# Patient Record
Sex: Female | Born: 1955 | Race: White | Hispanic: No | Smoking: Never smoker
Health system: Southern US, Community
[De-identification: ages and names within clinical notes are randomized; demographics above are authoritative.]

## PROBLEM LIST (undated history)

## (undated) DIAGNOSIS — K589 Irritable bowel syndrome without diarrhea: Secondary | ICD-10-CM

## (undated) DIAGNOSIS — R000 Tachycardia, unspecified: Secondary | ICD-10-CM

## (undated) DIAGNOSIS — IMO0002 Reserved for concepts with insufficient information to code with codable children: Secondary | ICD-10-CM

## (undated) DIAGNOSIS — R51 Headache: Secondary | ICD-10-CM

## (undated) DIAGNOSIS — C801 Malignant (primary) neoplasm, unspecified: Secondary | ICD-10-CM

## (undated) DIAGNOSIS — R112 Nausea with vomiting, unspecified: Secondary | ICD-10-CM

## (undated) DIAGNOSIS — M1990 Unspecified osteoarthritis, unspecified site: Secondary | ICD-10-CM

## (undated) DIAGNOSIS — F419 Anxiety disorder, unspecified: Secondary | ICD-10-CM

## (undated) DIAGNOSIS — I10 Essential (primary) hypertension: Secondary | ICD-10-CM

## (undated) DIAGNOSIS — N189 Chronic kidney disease, unspecified: Secondary | ICD-10-CM

## (undated) DIAGNOSIS — Z8719 Personal history of other diseases of the digestive system: Secondary | ICD-10-CM

## (undated) DIAGNOSIS — K219 Gastro-esophageal reflux disease without esophagitis: Secondary | ICD-10-CM

## (undated) DIAGNOSIS — R9430 Abnormal result of cardiovascular function study, unspecified: Secondary | ICD-10-CM

## (undated) DIAGNOSIS — Z9889 Other specified postprocedural states: Secondary | ICD-10-CM

## (undated) HISTORY — DX: Reserved for concepts with insufficient information to code with codable children: IMO0002

## (undated) HISTORY — DX: Tachycardia, unspecified: R00.0

## (undated) HISTORY — PX: TUBAL LIGATION: SHX77

## (undated) HISTORY — PX: OTHER SURGICAL HISTORY: SHX169

## (undated) HISTORY — PX: JOINT REPLACEMENT: SHX530

## (undated) HISTORY — DX: Abnormal result of cardiovascular function study, unspecified: R94.30

## (undated) HISTORY — PX: EYE SURGERY: SHX253

## (undated) HISTORY — PX: CHOLECYSTECTOMY: SHX55

---

## 2005-08-24 ENCOUNTER — Ambulatory Visit: Payer: Self-pay | Admitting: Cardiology

## 2005-09-14 ENCOUNTER — Ambulatory Visit: Payer: Self-pay | Admitting: Cardiology

## 2005-11-04 ENCOUNTER — Inpatient Hospital Stay (HOSPITAL_COMMUNITY): Admission: AD | Admit: 2005-11-04 | Discharge: 2005-11-11 | Payer: Self-pay | Admitting: Psychiatry

## 2005-11-05 ENCOUNTER — Ambulatory Visit: Payer: Self-pay | Admitting: Psychiatry

## 2005-11-06 ENCOUNTER — Ambulatory Visit: Payer: Self-pay | Admitting: Psychiatry

## 2010-05-19 ENCOUNTER — Ambulatory Visit: Payer: Self-pay | Admitting: Internal Medicine

## 2010-06-03 ENCOUNTER — Ambulatory Visit: Payer: Self-pay | Admitting: Internal Medicine

## 2010-06-03 ENCOUNTER — Ambulatory Visit (HOSPITAL_COMMUNITY): Admission: RE | Admit: 2010-06-03 | Discharge: 2010-06-03 | Payer: Self-pay | Admitting: Internal Medicine

## 2010-12-24 NOTE — Discharge Summary (Signed)
NAMEMarland Velazquez  SHAUNDA, TIPPING NO.:  [id number]   MEDICAL RECORD NO.:  [id number]          PATIENT TYPE:  IPS   LOCATION:  0307                          FACILITY:  [facility]   PHYSICIAN:  Anselm Jungling, MD  DATE OF BIRTH:  1955/12/02   DATE OF ADMISSION:  11/04/2005  DATE OF DISCHARGE:  11/11/2005                                 DISCHARGE SUMMARY   IDENTIFYING DATA/REASON FOR ADMISSION:  The patient is a 55 year old married  white female admitted for treatment of alcohol dependence and mood disorder.  She had been found passed out after an alcohol binge.  She indicated that  she had been drinking more heavily over the previous two years.  She had  been receiving Celexa for depression, and Imitrex for migraine.  She had  been treated at another facility and started on a phenobarbital alcohol  withdrawal protocol and was transferred on that regimen.  Please refer to  the admission note for further details pertaining to the symptoms,  circumstances and history that led to hospitalization.   INITIAL DIAGNOSTIC IMPRESSION:  She was given an initial AXIS I diagnosis of  alcohol dependence, impending alcohol withdrawal, and depressive disorder  not otherwise specified.   MEDICAL/LABORATORY:  The patient was medically and physically assessed by  the psychiatric nurse practitioner upon admission.  There were no  significant medical issues during this inpatient stay, outside of her  alcohol detoxification.   HOSPITAL COURSE:  The patient was admitted to the adult inpatient  psychiatric service.  She was discontinued from her phenobarbital protocol  in favor of a Librium withdrawal protocol.  She was continued on Celexa.  Low dose Risperdal was begun to further address symptoms of anxiety and  agitation.  She presented as a well-nourished, well-developed, healthy-  appearing and pleasant woman who was well-oriented, sad, and nontremulous.  There were no signs or symptoms of  psychosis or thought disorder, and she  denied hallucinations.  She was absent suicidal ideation, but wanted  continuing help with alcohol dependence, alcohol detoxification and ongoing  treatment of depression.   The patient's detoxification process continued fairly normally.  She  expressed a positive attitude towards sobriety and 12-step recovery and  indicated a strong desire to involve herself in Alcoholics Anonymous  immediately following her discharge.  She was able to cite close family who  were also interested in supporting her involvement in AA, and possibly Al-  Anon.   By the eighth hospital day, the patient appeared to be sufficiently stable  that she could be discharged.   On the final hospital day, there was a family meeting involving the patient,  her husband, and the psychiatric family therapist.  She denied suicidal  ideation.  They discussed the patient's plan to return home with her  husband.  Husband stated that he had thrown all of the pills out of the  home, and that he was going to be as supportive as he could of the patient.  He urged her to quit her second job.  This has been very stressful for her.  They discussed  different family members that will be further supporting the  patient after discharge and checking on her.  They discussed her impending  involvement with Alcoholics Anonymous.  Following this, the patient was  discharged.   AFTERCARE:  The patient was to follow up with Dr. Omelia Blackwater at [facility], and Zenia Resides, to be seen on November 18, 2005.   DISCHARGE MEDICATIONS:  1.  Celexa 40 mg daily.  2.  Risperdal 0.25 mg q.a.m. and 0.5 mg q.h.s.  3.  Cogentin 1 mg b.i.d.   DISCHARGE DIAGNOSES:  AXIS I:  Alcohol dependence.  Mood disorder not  otherwise specified.  AXIS II:  Deferred.  AXIS III:  No acute or chronic illnesses.  AXIS IV:  Stressors:  Severe.  AXIS V:  GAF on discharge 65.           ______________________________   Anselm Jungling, MD  Electronically Signed     SPB/MEDQ  D:  11/14/2005  T:  11/15/2005  Job:  [phone] [id number]

## 2010-12-24 NOTE — H&P (Signed)
NAMEMarland Velazquez  Velazquez, Kathleen NO.:  [id number]   MEDICAL RECORD NO.:  [id number]          PATIENT TYPE:  IPS   LOCATION:  0307                          FACILITY:  [facility]   PHYSICIAN:  Anselm Jungling, MD  DATE OF BIRTH:  [birth year]   DATE OF ADMISSION:  11/04/2005  DATE OF DISCHARGE:                         PSYCHIATRIC ADMISSION ASSESSMENT   IDENTIFYING INFORMATION:  This is an involuntary admission to the services  of Dr. Electa Sniff.  This is a 55 year old married white female.  She presented  to [facility] yesterday with a blood alcohol level of 312.  She is a  patient there of Dr. Robynn Pane.  Apparently she was acting quite unusual.  According to the patient's husband she was very anxious.  She has been  depressed and recently has been drinking a lot of alcohol.  There was a  question as to whether she had taken some of her medications from home and  she is employed as a Lawyer at [employer].   PAST PSYCHIATRIC HISTORY:  She states that she saw a therapist approximately  2 years ago.  She states her depression began in 2005.  At that time there  were issues going on in the marriage.  Also she was status post a total  right knee and a woman that she sat with died.  She states this gave her for  some unknown reason quite a bit of depression and she has not been able to  resolve that.  She states that she has been prescribed Celexa and when she  takes it is beneficial, however if she needs to refill the prescription or  for a variety of reasons she will become noncompliant.  When alone, she  starts to drink.  She drinks vodka.   SOCIAL HISTORY:  She finished the 12th grade.  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  This is her second marriage.  She has a daughter from her first  marriage, age 40.  She has a stepson from this marriage, age 40.   FAMILY HISTORY:  Her father is an alcoholic.  In fact, her mother divorced  him 12 years ago because of this, and she  provides care to him.  Apparently  he has COPD.   ALCOHOL AND DRUG ABUSE:  She states that alcohol became problematic in the  past year or so.  She is drinking vodka when alone, especially at night.   PAST MEDICAL HISTORY:  She is status post a cholecystectomy, right knee  replacement in 2005, tubal ligation.  She has just begun therapy for hot  flashes with Prempro.  Medications:  She is currently prescribed Celexa 40  mg p.o. daily, Imitrex Nasal 20 mg p.r.n. each nostril for migraine  prophylaxis, Ambien CR 6.25 mg at h.s. and Prempro 1 p.o. daily.   POSITIVE PHYSICAL FINDINGS:  PHYSICAL EXAMINATION:  Physical examination  reveals a well-nourished, well-developed labile female.  She easily breaks  into tears.  Other than that her physical examination is unremarkable.  Her  admitting vital signs show that her temperature is 98, blood pressure  is  15/91, pulse 96, respirations 16, O2 saturation is 98.  She does have  varicose veins.  She has a scar from her right total knee and she has  several tattoos.  The remainder of her other lab work:  Her CBC showed a  white count of 8200, hemoglobin 14.4, platelet count was 238,000.  Urine was  normal with a trace of blood.  Urine drug screen was negative.  Acetaminophen level was less than 10, salicylate level was less than 4.   ALLERGIES:  Drug allergies are to CODEINE.   MENTAL STATUS EXAM:  She is alert and oriented.  She is appropriately  groomed, dressed and nourished.  She has good eye contact.  Her speech is  not pressured.  Her mood however is labile.  She cries freely and  frequently.  Her affect shows a range.  Her thought processes are clear,  well organized, and goal oriented.  She states that she has to take better  care of herself.  Her judgment and insight are fair.  Her intelligence is at  least average.  She specifically denies suicidal or homicidal ideation.  She  denies auditory or visual hallucinations.  She states that a  big issue that  she has is with sleep.  She says she just cannot turn her thoughts off at  night and so we may want to consider some Lamictal or some other mood  stabilizer.   ADMISSION DIAGNOSES:  AXIS I:  Depression and anxiety, alcohol dependency.  AXIS II:  None.  AXIS III:  Migraine headaches, status post right knee replacement, status  post cholecystectomy, tubal ligation, currently just beginning treatment for  menopausal symptoms.  AXIS IV:  Severe.  AXIS V:  Global assessment of function is 30.   PLAN:  The plan is to admit for safety and stabilization.  We will detox  according to the phenobarbital protocol.  We will re-establish compliance  with Celexa since it is of benefit to her, and consider adding a mood  stabilizer to help with her racing thoughts at night.      Mickie Leonarda Salon, P.A.-C.      Anselm Jungling, MD  Electronically Signed    MD/MEDQ  D:  11/05/2005  T:  11/06/2005  Job:  [id number]

## 2011-01-05 ENCOUNTER — Ambulatory Visit (INDEPENDENT_AMBULATORY_CARE_PROVIDER_SITE_OTHER): Payer: PRIVATE HEALTH INSURANCE | Admitting: Internal Medicine

## 2011-01-05 DIAGNOSIS — K219 Gastro-esophageal reflux disease without esophagitis: Secondary | ICD-10-CM

## 2011-01-05 DIAGNOSIS — K589 Irritable bowel syndrome without diarrhea: Secondary | ICD-10-CM

## 2011-08-24 ENCOUNTER — Other Ambulatory Visit: Payer: Self-pay | Admitting: Orthopedic Surgery

## 2011-11-14 ENCOUNTER — Encounter (HOSPITAL_COMMUNITY): Payer: Self-pay

## 2011-11-21 ENCOUNTER — Ambulatory Visit (HOSPITAL_COMMUNITY)
Admission: RE | Admit: 2011-11-21 | Discharge: 2011-11-21 | Disposition: A | Discharge status: Home / Self Care | Payer: No Typology Code available for payment source | Source: Ambulatory Visit | Attending: Orthopedic Surgery | Admitting: Orthopedic Surgery

## 2011-11-21 ENCOUNTER — Encounter (HOSPITAL_COMMUNITY): Payer: Self-pay

## 2011-11-21 ENCOUNTER — Encounter (HOSPITAL_COMMUNITY)
Admission: RE | Admit: 2011-11-21 | Discharge: 2011-11-21 | Disposition: A | Discharge status: Home / Self Care | Payer: No Typology Code available for payment source | Source: Ambulatory Visit | Attending: Orthopedic Surgery | Admitting: Orthopedic Surgery

## 2011-11-21 DIAGNOSIS — Z01810 Encounter for preprocedural cardiovascular examination: Secondary | ICD-10-CM | POA: Insufficient documentation

## 2011-11-21 DIAGNOSIS — Z01818 Encounter for other preprocedural examination: Secondary | ICD-10-CM | POA: Insufficient documentation

## 2011-11-21 DIAGNOSIS — Z01812 Encounter for preprocedural laboratory examination: Secondary | ICD-10-CM | POA: Insufficient documentation

## 2011-11-21 DIAGNOSIS — M1710 Unilateral primary osteoarthritis, unspecified knee: Secondary | ICD-10-CM | POA: Insufficient documentation

## 2011-11-21 HISTORY — DX: Anxiety disorder, unspecified: F41.9

## 2011-11-21 HISTORY — DX: Unspecified osteoarthritis, unspecified site: M19.90

## 2011-11-21 HISTORY — DX: Headache: R51

## 2011-11-21 HISTORY — DX: Gastro-esophageal reflux disease without esophagitis: K21.9

## 2011-11-21 LAB — CBC
HCT: 39.5 % (ref 36.0–46.0)
Hemoglobin: 13.4 g/dL (ref 12.0–15.0)
MCH: 31.2 pg (ref 26.0–34.0)
MCHC: 33.9 g/dL (ref 30.0–36.0)
RDW: 12 % (ref 11.5–15.5)

## 2011-11-21 LAB — URINALYSIS, ROUTINE W REFLEX MICROSCOPIC
Hgb urine dipstick: NEGATIVE
Leukocytes, UA: NEGATIVE
Nitrite: NEGATIVE
Protein, ur: NEGATIVE mg/dL
Specific Gravity, Urine: 1.018 (ref 1.005–1.030)
Urobilinogen, UA: 0.2 mg/dL (ref 0.0–1.0)

## 2011-11-21 LAB — COMPREHENSIVE METABOLIC PANEL
Albumin: 4.1 g/dL (ref 3.5–5.2)
BUN: 14 mg/dL (ref 6–23)
Calcium: 9.7 mg/dL (ref 8.4–10.5)
Creatinine, Ser: 0.8 mg/dL (ref 0.50–1.10)
GFR calc Af Amer: >90 mL/min (ref >90)
Glucose, Bld: 90 mg/dL (ref 70–99)
Potassium: 3.5 mEq/L (ref 3.5–5.1)
Total Protein: 7.2 g/dL (ref 6.0–8.3)

## 2011-11-21 LAB — PROTIME-INR
INR: 0.96 (ref 0.00–1.49)
Prothrombin Time: 13 seconds (ref 11.6–15.2)

## 2011-11-21 LAB — APTT: aPTT: 26 seconds (ref 24–37)

## 2011-11-21 LAB — SURGICAL PCR SCREEN: Staphylococcus aureus: POSITIVE — AB

## 2011-11-21 NOTE — Patient Instructions (Signed)
20 Kathleen Velazquez  11/21/2011   Your procedure is scheduled on:  11/28/11 [id number]  Report to [facility] at 0600 AM.  Call this number if you have problems the morning of surgery: [phone]   Remember:   Do not eat food:After Midnight.  May have clear liquids:until Midnight .  Clear liquids include soda, tea, black coffee, apple or grape juice, broth.  Take these medicines the morning of surgery with A SIP OF WATER:    Do not wear jewelry, make-up or nail polish.  Do not wear lotions, powders, or perfumes.   Do not shave 48 hours prior to surgery.  Do not bring valuables to the hospital.  Contacts, dentures or bridgework may not be worn into surgery.  Leave suitcase in the car. After surgery it may be brought to your room.  For patients admitted to the hospital, checkout time is 11:00 AM the day of discharge.     Special Instructions: CHG Shower Use Special Wash: 1/2 bottle night before surgery and 1/2 bottle morning of surgery. shower chin to toes with CHG.  Wash face and private parts with regular soap.     Please read over the following fact sheets that you were given: MRSA Information, Incentive Spirometry Fact Sheet, coughing and deep breathing exercises, leg exercises

## 2011-11-21 NOTE — Pre-Procedure Instructions (Signed)
11/21/11 Drew, PA made aware no preop antibiotic ordered .  Faxed and confirmation received to [place], [place] at [phone].

## 2011-11-22 NOTE — Pre-Procedure Instructions (Signed)
11/22/11 Pt called back and had received message regarding positive pcr screen for staph.  PT voiced understanding of instructions.

## 2011-11-22 NOTE — H&P (Signed)
Kathleen Velazquez DOB: 1955-11-22   Chief Complaint: left knee pain  History of Present Illness The patient is a 56 year old female who comes in today for a preoperative History and Physical. The patient is scheduled for a left total knee arthroplasty to be performed by Dr. Gus Rankin. Aluisio, MD at [facility] on Monday November 28, 2011 . The patient reports left knee symptoms including pain, swelling, instability, locking, catching, stiffness, soreness and grinding without any known injury for several months. The patient feels that the symptoms are worsening. The patient has the current diagnosis of knee osteoarthritis. Past treatment for this problem has included intra-articular injection of corticosteroids with aspiration.Kathleen Velazquez has had her other knee was replaced by Dr. Arletha Grippe about eight years ago. She said she did well with that but she is very stiff. The left knee is what is bothering her the most. This is limiting what she can and can not do. The left knee bothers her more than the right one did before she had that replaced. The cortisone and visco supplements have not provided any long lasting benefit. Most predictable means for decreased pain and increased function is a left total knee arthroplasty. Risks and benefits of surgery discussed. PCP: Dr. Tery Sanfilippo    Past MedicalHistory Osteoarthritis, Knee (715.96) Migraine Headache Osteoarthritis Ulcer disease Irritable bowel syndrome Anxiety Disorder Depression Gastroesophageal Reflux Disease Alcoholism. sober 6 years Hemorrhoids Urinary Incontinence   Allergies No Known Drug Allergies.    Family History Cancer. father and grandmother mothers side Cerebrovascular Accident. father Osteoporosis. mother Chronic Obstructive Lung Disease. father Drug / Alcohol Addiction. father, grandfather mothers side and grandfather fathers side Heart Disease. grandfather mothers side Father. deceased  age 6 due to heart disease, MI: COPD, CHF Mother. living age 57: MI, stent x 2   Social History Children. 1 Drug/Alcohol Rehab (Currently). yes Drug/Alcohol Rehab (Previously). yes Alcohol use. Formerly drank alcohol. Sober 6 years Exercise. Exercises weekly; does gym / weights Tobacco use. never smoker Pain Contract. no Illicit drug use. no Marital status. married Number of flights of stairs before winded. 2-3 Post-Surgical Plans. Home, husband caregiver Advance Directives. healthcare POA   Medication History Prempro (0.45-1.5MG  Tablet, Oral daily) Active. Tricor (145MG  Tablet, Oral daily) Active. Pantoprazole Sodium (40MG  Tablet DR, Oral daily) Active. SUMAtriptan Succinate (100MG  Tablet, Oral as needed) Active. LORazepam (0.5MG  Tablet, Oral at bedtime) Active. TraZODone HCl (100MG  Tablet, Oral at bedtime) Active. Pristiq (50MG  Tablet ER 24HR, Oral daily) Active. Dicyclomine HCl (10MG  Capsule, Oral four times daily) Active. Sucralfate (1GM Tablet, Oral four times daily) Active.   Pregnancy Pregnant. no   Past Surgical History Tubal Ligation. 2004 Total Knee Replacement. right 2005 Arthroscopy of Knee. right 1995 Breast Biopsy. left Gallbladder Surgery. laporoscopic 2003   Diagnostic Studies History EKG. 11/21/11 Chest X-ray. 11/21/11   Review of Systems General:Not Present- Chills, Fever, Night Sweats, Fatigue, Weight Gain, Weight Loss and Memory Loss. Skin:Not Present- Hives, Itching, Rash, Eczema and Lesions. HEENT:Present- Headache. Not Present- Tinnitus, Double Vision, Visual Loss, Hearing Loss and Dentures. Respiratory:Not Present- Shortness of breath with exertion, Shortness of breath at rest, Allergies, Coughing up blood and Chronic Cough. Cardiovascular:Not Present- Chest Pain, Racing/skipping heartbeats, Difficulty Breathing Lying Down, Murmur, Swelling and Palpitations. Gastrointestinal:Present- Diarrhea. Not  Present- Bloody Stool, Heartburn, Abdominal Pain, Vomiting, Nausea, Constipation, Difficulty Swallowing, Jaundice and Loss of appetitie. Female Genitourinary:Present- Urinary frequency and Incontinence. Not Present- Blood in Urine, Weak urinary stream, Discharge, Flank Pain, Painful Urination, Urgency, Urinary Retention and Urinating at  Night. Musculoskeletal:Present- Joint Swelling, Joint Pain and Morning Stiffness. Not Present- Muscle Weakness, Muscle Pain, Back Pain and Spasms. Neurological:Not Present- Tremor, Dizziness, Blackout spells, Paralysis, Difficulty with balance and Weakness. Psychiatric:Present- Insomnia.   Vitals Weight: 134 lb Height: 62 in Body Surface Area: 1.63 m Body Mass Index: 24.51 kg/m Pulse: 104 (Regular) Resp.: 18 (Unlabored) BP: 138/84 (Sitting, Left Arm, Standard)    Physical Exam General Mental Status - Alert, cooperative and good historian. General Appearance- pleasant. Not in acute distress. Orientation- Oriented X3. Build & Nutrition- Well nourished and Well developed. Head and Neck Head- normocephalic, atraumatic . Neck Global Assessment- supple. no bruit auscultated on the right and no bruit auscultated on the left. Eye Pupil- Bilateral- Normal. Motion- Bilateral- EOMI. Wears glasses Chest and Lung Exam Auscultation: Breath sounds:- clear at anterior chest wall and - clear at posterior chest wall. Adventitious sounds:- No Adventitious sounds. Cardiovascular Auscultation:Rhythm- Regular and Tachycardic. Heart Sounds- S1 WNL and S2 WNL. Murmurs & Other Heart Sounds:Auscultation of the heart reveals - No Murmurs. Abdomen Palpation/Percussion:Tenderness- Abdomen is non-tender to palpation. Rigidity (guarding)- Abdomen is soft. Auscultation:Auscultation of the abdomen reveals - Bowel sounds normal. Female Genitourinary Not done, not pertinent to present illness Peripheral Vascular Upper  Extremity: Palpation:- Pulses bilaterally normal. Lower Extremity: Palpation:- Pulses bilaterally normal. Neurologic Examination of related systems reveals - normal muscle strength and tone in all extremities. Neurologic evaluation reveals - normal sensation and upper and lower extremity deep tendon reflexes intact bilaterally . Musculoskeletal Her hips show a normal range of motion, no discomfort. The right knee shows a well healed old incision. The range of motion is 0-100 degrees. There is no instability noted on exam. There is no specific tenderness. The left knee shows a slight swelling. Slight effusion. Range is 5-115. There is a marked crepitus on range of motion. Tender medial greater than lateral. No instability is noted.   RADIOGRAPHS: AP of both knees and lateral show she has bone on bone arthritis in the medial and patellofemoral compartments of the left knee with some tibial subluxation and with osteophyte formation. Right knee , AP shows a total knee arthroplasty and the tibial tray looks like it may have some overhang over the lateral aspect of the tibia. Alignment otherwise looks OK.   Assessment & Plan Osteoarthritis, Knee (715.96) Left total knee arthroplasty     Dimitri Ped, PA-C

## 2011-11-22 NOTE — Pre-Procedure Instructions (Signed)
11/22/11 Faxed confirmed ekg showing anterior infarct with no history to Avel Peace, PA at [facility] and received confirmation.

## 2011-11-28 ENCOUNTER — Encounter (HOSPITAL_COMMUNITY): Payer: Self-pay | Admitting: *Deleted

## 2011-11-28 ENCOUNTER — Inpatient Hospital Stay (HOSPITAL_COMMUNITY)
Admission: RE | Admit: 2011-11-28 | Discharge: 2011-12-03 | DRG: 470 | Disposition: A | Discharge status: Home Health | Payer: No Typology Code available for payment source | Source: Ambulatory Visit | Attending: Orthopedic Surgery | Admitting: Orthopedic Surgery

## 2011-11-28 ENCOUNTER — Encounter (HOSPITAL_COMMUNITY)
Admission: RE | Disposition: A | Discharge status: Home Health | Payer: Self-pay | Source: Ambulatory Visit | Attending: Orthopedic Surgery

## 2011-11-28 ENCOUNTER — Encounter (HOSPITAL_COMMUNITY): Payer: Self-pay | Admitting: Anesthesiology

## 2011-11-28 ENCOUNTER — Ambulatory Visit (HOSPITAL_COMMUNITY): Payer: No Typology Code available for payment source | Admitting: Anesthesiology

## 2011-11-28 DIAGNOSIS — R000 Tachycardia, unspecified: Secondary | ICD-10-CM

## 2011-11-28 DIAGNOSIS — D62 Acute posthemorrhagic anemia: Secondary | ICD-10-CM | POA: Diagnosis not present

## 2011-11-28 DIAGNOSIS — K219 Gastro-esophageal reflux disease without esophagitis: Secondary | ICD-10-CM | POA: Diagnosis present

## 2011-11-28 DIAGNOSIS — I498 Other specified cardiac arrhythmias: Secondary | ICD-10-CM | POA: Diagnosis not present

## 2011-11-28 DIAGNOSIS — M1710 Unilateral primary osteoarthritis, unspecified knee: Secondary | ICD-10-CM | POA: Diagnosis present

## 2011-11-28 DIAGNOSIS — R9431 Abnormal electrocardiogram [ECG] [EKG]: Secondary | ICD-10-CM | POA: Diagnosis not present

## 2011-11-28 HISTORY — PX: TOTAL KNEE ARTHROPLASTY: SHX125

## 2011-11-28 LAB — ABO/RH: ABO/RH(D): O POS

## 2011-11-28 SURGERY — ARTHROPLASTY, KNEE, TOTAL
Anesthesia: General | Site: Knee | Laterality: Left | Wound class: Clean

## 2011-11-28 MED ORDER — METHOCARBAMOL 500 MG PO TABS
500.0000 mg | ORAL_TABLET | Freq: Four times a day (QID) | ORAL | Status: DC | PRN
  Administered 2011-11-29 – 2011-12-02 (×9): 500 mg via ORAL
  Filled 2011-11-28 (×9): qty 1

## 2011-11-28 MED ORDER — PANTOPRAZOLE SODIUM 40 MG PO TBEC
40.0000 mg | DELAYED_RELEASE_TABLET | Freq: Every day | ORAL | Status: DC
  Administered 2011-11-28 – 2011-12-02 (×5): 40 mg via ORAL
  Filled 2011-11-28 (×6): qty 1

## 2011-11-28 MED ORDER — FENOFIBRATE 160 MG PO TABS
160.0000 mg | ORAL_TABLET | Freq: Every day | ORAL | Status: DC
  Administered 2011-11-29 – 2011-12-03 (×5): 160 mg via ORAL
  Filled 2011-11-28 (×6): qty 1

## 2011-11-28 MED ORDER — ONDANSETRON HCL 4 MG/2ML IJ SOLN: 4.0000 mg | Freq: Four times a day (QID) | INTRAMUSCULAR | Status: DC | PRN

## 2011-11-28 MED ORDER — MORPHINE SULFATE (PF) 1 MG/ML IV SOLN
INTRAVENOUS | Status: AC
  Filled 2011-11-28: qty 25

## 2011-11-28 MED ORDER — CEFAZOLIN SODIUM 1-5 GM-% IV SOLN: 1.0000 g | INTRAVENOUS | Status: DC

## 2011-11-28 MED ORDER — LORAZEPAM 0.5 MG PO TABS
0.5000 mg | ORAL_TABLET | Freq: Every day | ORAL | Status: DC
  Administered 2011-11-28 – 2011-12-02 (×5): 0.5 mg via ORAL
  Filled 2011-11-28 (×5): qty 1

## 2011-11-28 MED ORDER — POLYETHYLENE GLYCOL 3350 17 G PO PACK
17.0000 g | PACK | Freq: Every day | ORAL | Status: DC | PRN
  Administered 2011-11-30: 17 g via ORAL
  Filled 2011-11-28: qty 1

## 2011-11-28 MED ORDER — KCL IN DEXTROSE-NACL 20-5-0.9 MEQ/L-%-% IV SOLN
INTRAVENOUS | Status: DC
  Administered 2011-11-28 – 2011-11-30 (×3): via INTRAVENOUS
  Filled 2011-11-28 (×3): qty 1000

## 2011-11-28 MED ORDER — BUPIVACAINE ON-Q PAIN PUMP (FOR ORDER SET NO CHG)
INJECTION | Status: DC
  Filled 2011-11-28: qty 1

## 2011-11-28 MED ORDER — DEXAMETHASONE SODIUM PHOSPHATE 10 MG/ML IJ SOLN: 10.0000 mg | Freq: Once | INTRAMUSCULAR | Status: DC

## 2011-11-28 MED ORDER — LACTATED RINGERS IV SOLN
INTRAVENOUS | Status: DC
  Administered 2011-11-28: 1000 mL via INTRAVENOUS
  Administered 2011-11-28: 11:00:00 via INTRAVENOUS

## 2011-11-28 MED ORDER — DICYCLOMINE HCL 10 MG PO CAPS
10.0000 mg | ORAL_CAPSULE | Freq: Four times a day (QID) | ORAL | Status: DC
  Administered 2011-11-28 – 2011-12-03 (×19): 10 mg via ORAL
  Filled 2011-11-28 (×24): qty 1

## 2011-11-28 MED ORDER — DROPERIDOL 2.5 MG/ML IJ SOLN
INTRAMUSCULAR | Status: DC | PRN
  Administered 2011-11-28: 0.625 mg via INTRAVENOUS

## 2011-11-28 MED ORDER — LACTATED RINGERS IV SOLN
INTRAVENOUS | Status: DC | PRN
  Administered 2011-11-28 (×2): via INTRAVENOUS

## 2011-11-28 MED ORDER — LABETALOL HCL 5 MG/ML IV SOLN
INTRAVENOUS | Status: DC | PRN
  Administered 2011-11-28 (×3): 5 mg via INTRAVENOUS

## 2011-11-28 MED ORDER — METHOCARBAMOL 100 MG/ML IJ SOLN
500.0000 mg | Freq: Four times a day (QID) | INTRAVENOUS | Status: DC | PRN
  Administered 2011-11-28 – 2011-11-29 (×2): 500 mg via INTRAVENOUS
  Filled 2011-11-28 (×2): qty 5

## 2011-11-28 MED ORDER — BUPIVACAINE 0.25 % ON-Q PUMP SINGLE CATH 300ML
300.0000 mL | INJECTION | Status: DC
  Filled 2011-11-28: qty 300

## 2011-11-28 MED ORDER — RIVAROXABAN 10 MG PO TABS
10.0000 mg | ORAL_TABLET | Freq: Every day | ORAL | Status: DC
  Administered 2011-11-29 – 2011-12-03 (×5): 10 mg via ORAL
  Filled 2011-11-28 (×6): qty 1

## 2011-11-28 MED ORDER — BUPIVACAINE 0.25 % ON-Q PUMP SINGLE CATH 300ML
INJECTION | Status: AC
  Filled 2011-11-28: qty 300

## 2011-11-28 MED ORDER — DEXAMETHASONE SODIUM PHOSPHATE 10 MG/ML IJ SOLN
INTRAMUSCULAR | Status: DC | PRN
  Administered 2011-11-28: 10 mg via INTRAVENOUS

## 2011-11-28 MED ORDER — ACETAMINOPHEN 10 MG/ML IV SOLN
INTRAVENOUS | Status: AC
  Filled 2011-11-28: qty 100

## 2011-11-28 MED ORDER — ACETAMINOPHEN 10 MG/ML IV SOLN
INTRAVENOUS | Status: DC | PRN
  Administered 2011-11-28: 1000 mg via INTRAVENOUS

## 2011-11-28 MED ORDER — FLEET ENEMA 7-19 GM/118ML RE ENEM: 1.0000 | ENEMA | Freq: Once | RECTAL | Status: AC | PRN

## 2011-11-28 MED ORDER — SODIUM CHLORIDE 0.9 % IV SOLN: INTRAVENOUS | Status: DC

## 2011-11-28 MED ORDER — TEMAZEPAM 15 MG PO CAPS: 15.0000 mg | ORAL_CAPSULE | Freq: Every evening | ORAL | Status: DC | PRN

## 2011-11-28 MED ORDER — ONDANSETRON HCL 4 MG/2ML IJ SOLN
INTRAMUSCULAR | Status: DC | PRN
  Administered 2011-11-28 (×2): 2 mg via INTRAVENOUS

## 2011-11-28 MED ORDER — ACETAMINOPHEN 650 MG RE SUPP: 650.0000 mg | Freq: Four times a day (QID) | RECTAL | Status: DC | PRN

## 2011-11-28 MED ORDER — DOCUSATE SODIUM 100 MG PO CAPS
100.0000 mg | ORAL_CAPSULE | Freq: Two times a day (BID) | ORAL | Status: DC
  Administered 2011-11-28 – 2011-12-03 (×10): 100 mg via ORAL
  Filled 2011-11-28 (×11): qty 1

## 2011-11-28 MED ORDER — VENLAFAXINE HCL ER 75 MG PO CP24
75.0000 mg | ORAL_CAPSULE | Freq: Every day | ORAL | Status: DC
  Administered 2011-11-29 – 2011-12-03 (×5): 75 mg via ORAL
  Filled 2011-11-28 (×7): qty 1

## 2011-11-28 MED ORDER — METOCLOPRAMIDE HCL 5 MG/ML IJ SOLN
5.0000 mg | Freq: Three times a day (TID) | INTRAMUSCULAR | Status: DC | PRN
  Administered 2011-11-28: 10 mg via INTRAVENOUS
  Filled 2011-11-28: qty 2

## 2011-11-28 MED ORDER — PROPOFOL 10 MG/ML IV EMUL
INTRAVENOUS | Status: DC | PRN
  Administered 2011-11-28: 200 mg via INTRAVENOUS
  Administered 2011-11-28: 25 mg via INTRAVENOUS

## 2011-11-28 MED ORDER — CEFAZOLIN SODIUM 1-5 GM-% IV SOLN
INTRAVENOUS | Status: DC | PRN
  Administered 2011-11-28: 1 g via INTRAVENOUS

## 2011-11-28 MED ORDER — CEFAZOLIN SODIUM 1-5 GM-% IV SOLN
INTRAVENOUS | Status: AC
  Filled 2011-11-28: qty 50

## 2011-11-28 MED ORDER — NALOXONE HCL 0.4 MG/ML IJ SOLN: 0.4000 mg | INTRAMUSCULAR | Status: DC | PRN

## 2011-11-28 MED ORDER — SODIUM CHLORIDE 0.9 % IR SOLN
Status: DC | PRN
  Administered 2011-11-28: 1000 mL

## 2011-11-28 MED ORDER — CHLORHEXIDINE GLUCONATE 4 % EX LIQD
60.0000 mL | Freq: Once | CUTANEOUS | Status: DC
  Filled 2011-11-28: qty 60

## 2011-11-28 MED ORDER — FENTANYL CITRATE 0.05 MG/ML IJ SOLN
INTRAMUSCULAR | Status: DC | PRN
  Administered 2011-11-28: 100 ug via INTRAVENOUS
  Administered 2011-11-28: 50 ug via INTRAVENOUS
  Administered 2011-11-28: 100 ug via INTRAVENOUS
  Administered 2011-11-28: 50 ug via INTRAVENOUS

## 2011-11-28 MED ORDER — SUCRALFATE 1 G PO TABS
1.0000 g | ORAL_TABLET | Freq: Four times a day (QID) | ORAL | Status: DC
  Administered 2011-11-28 – 2011-12-03 (×20): 1 g via ORAL
  Filled 2011-11-28 (×24): qty 1

## 2011-11-28 MED ORDER — CISATRACURIUM BESYLATE 2 MG/ML IV SOLN
INTRAVENOUS | Status: DC | PRN
  Administered 2011-11-28: 6 mg via INTRAVENOUS

## 2011-11-28 MED ORDER — SUCCINYLCHOLINE CHLORIDE 20 MG/ML IJ SOLN
INTRAMUSCULAR | Status: DC | PRN
  Administered 2011-11-28: 100 mg via INTRAVENOUS

## 2011-11-28 MED ORDER — PHENOL 1.4 % MT LIQD
1.0000 | OROMUCOSAL | Status: DC | PRN
  Filled 2011-11-28: qty 177

## 2011-11-28 MED ORDER — SODIUM CHLORIDE 0.9 % IJ SOLN: 9.0000 mL | INTRAMUSCULAR | Status: DC | PRN

## 2011-11-28 MED ORDER — SUMATRIPTAN SUCCINATE 100 MG PO TABS
100.0000 mg | ORAL_TABLET | ORAL | Status: DC | PRN
  Filled 2011-11-28: qty 1

## 2011-11-28 MED ORDER — OXYCODONE HCL 5 MG PO TABS
5.0000 mg | ORAL_TABLET | ORAL | Status: DC | PRN
  Administered 2011-11-29: 5 mg via ORAL
  Administered 2011-11-29: 10 mg via ORAL
  Administered 2011-11-29 (×3): 5 mg via ORAL
  Administered 2011-11-30 (×2): 10 mg via ORAL
  Administered 2011-12-01: 5 mg via ORAL
  Administered 2011-12-01 (×2): 10 mg via ORAL
  Administered 2011-12-01: 5 mg via ORAL
  Administered 2011-12-02 – 2011-12-03 (×6): 10 mg via ORAL
  Filled 2011-11-28: qty 1
  Filled 2011-11-28 (×6): qty 2
  Filled 2011-11-28: qty 1
  Filled 2011-11-28 (×4): qty 2
  Filled 2011-11-28 (×2): qty 1
  Filled 2011-11-28: qty 2
  Filled 2011-11-28 (×2): qty 1

## 2011-11-28 MED ORDER — DIPHENHYDRAMINE HCL 12.5 MG/5ML PO ELIX: 12.5000 mg | ORAL_SOLUTION | ORAL | Status: DC | PRN

## 2011-11-28 MED ORDER — MENTHOL 3 MG MT LOZG
1.0000 | LOZENGE | OROMUCOSAL | Status: DC | PRN
  Filled 2011-11-28: qty 9

## 2011-11-28 MED ORDER — BISACODYL 10 MG RE SUPP: 10.0000 mg | Freq: Every day | RECTAL | Status: DC | PRN

## 2011-11-28 MED ORDER — GLYCOPYRROLATE 0.2 MG/ML IJ SOLN
INTRAMUSCULAR | Status: DC | PRN
  Administered 2011-11-28: .3 mg via INTRAVENOUS

## 2011-11-28 MED ORDER — MORPHINE SULFATE (PF) 1 MG/ML IV SOLN
INTRAVENOUS | Status: DC
  Administered 2011-11-28: 11:00:00 via INTRAVENOUS
  Administered 2011-11-28 – 2011-11-29 (×3): 1 mg via INTRAVENOUS

## 2011-11-28 MED ORDER — DIPHENHYDRAMINE HCL 12.5 MG/5ML PO ELIX: 12.5000 mg | ORAL_SOLUTION | Freq: Four times a day (QID) | ORAL | Status: DC | PRN

## 2011-11-28 MED ORDER — LIDOCAINE HCL (CARDIAC) 20 MG/ML IV SOLN
INTRAVENOUS | Status: DC | PRN
  Administered 2011-11-28: 75 mg via INTRAVENOUS

## 2011-11-28 MED ORDER — NEOSTIGMINE METHYLSULFATE 1 MG/ML IJ SOLN
INTRAMUSCULAR | Status: DC | PRN
  Administered 2011-11-28: 3 mg via INTRAVENOUS

## 2011-11-28 MED ORDER — ACETAMINOPHEN 10 MG/ML IV SOLN
1000.0000 mg | Freq: Once | INTRAVENOUS | Status: DC
  Filled 2011-11-28: qty 100

## 2011-11-28 MED ORDER — CEFAZOLIN SODIUM 1-5 GM-% IV SOLN
1.0000 g | Freq: Four times a day (QID) | INTRAVENOUS | Status: AC
  Administered 2011-11-28 – 2011-11-29 (×3): 1 g via INTRAVENOUS
  Filled 2011-11-28 (×3): qty 50

## 2011-11-28 MED ORDER — ACETAMINOPHEN 10 MG/ML IV SOLN
1000.0000 mg | Freq: Four times a day (QID) | INTRAVENOUS | Status: AC
  Administered 2011-11-28 – 2011-11-29 (×4): 1000 mg via INTRAVENOUS
  Filled 2011-11-28 (×5): qty 100

## 2011-11-28 MED ORDER — MIDAZOLAM HCL 5 MG/5ML IJ SOLN
INTRAMUSCULAR | Status: DC | PRN
  Administered 2011-11-28: 2 mg via INTRAVENOUS

## 2011-11-28 MED ORDER — ONDANSETRON HCL 4 MG PO TABS: 4.0000 mg | ORAL_TABLET | Freq: Four times a day (QID) | ORAL | Status: DC | PRN

## 2011-11-28 MED ORDER — ACETAMINOPHEN 325 MG PO TABS: 650.0000 mg | ORAL_TABLET | Freq: Four times a day (QID) | ORAL | Status: DC | PRN

## 2011-11-28 MED ORDER — HYDROMORPHONE HCL PF 1 MG/ML IJ SOLN
INTRAMUSCULAR | Status: DC | PRN
  Administered 2011-11-28 (×2): 1 mg via INTRAVENOUS

## 2011-11-28 MED ORDER — METOCLOPRAMIDE HCL 10 MG PO TABS: 5.0000 mg | ORAL_TABLET | Freq: Three times a day (TID) | ORAL | Status: DC | PRN

## 2011-11-28 MED ORDER — DIPHENHYDRAMINE HCL 50 MG/ML IJ SOLN: 12.5000 mg | Freq: Four times a day (QID) | INTRAMUSCULAR | Status: DC | PRN

## 2011-11-28 MED ORDER — TRAZODONE HCL 100 MG PO TABS
100.0000 mg | ORAL_TABLET | Freq: Every day | ORAL | Status: DC
  Administered 2011-11-28 – 2011-12-02 (×5): 100 mg via ORAL
  Filled 2011-11-28 (×6): qty 1

## 2011-11-28 SURGICAL SUPPLY — 54 items
BAG ZIPLOCK 12X15 (MISCELLANEOUS) ×2 IMPLANT
BANDAGE ELASTIC 6 VELCRO ST LF (GAUZE/BANDAGES/DRESSINGS) ×2 IMPLANT
BANDAGE ESMARK 6X9 LF (GAUZE/BANDAGES/DRESSINGS) ×1 IMPLANT
BLADE SAG 18X100X1.27 (BLADE) ×2 IMPLANT
BLADE SAW SGTL 11.0X1.19X90.0M (BLADE) ×2 IMPLANT
BNDG ESMARK 6X9 LF (GAUZE/BANDAGES/DRESSINGS) ×2
BOWL SMART MIX CTS (DISPOSABLE) ×2 IMPLANT
CATH KIT ON-Q SILVERSOAK 5IN (CATHETERS) ×2 IMPLANT
CEMENT HV SMART SET (Cement) ×4 IMPLANT
CLOTH BEACON ORANGE TIMEOUT ST (SAFETY) ×2 IMPLANT
CLSR STERI-STRIP ANTIMIC 1/2X4 (GAUZE/BANDAGES/DRESSINGS) ×2 IMPLANT
CUFF TOURN SGL QUICK 34 (TOURNIQUET CUFF) ×2
CUFF TRNQT CYL 34X4X40X1 (TOURNIQUET CUFF) ×1 IMPLANT
DRAPE EXTREMITY T 121X128X90 (DRAPE) ×2 IMPLANT
DRAPE POUCH INSTRU U-SHP 10X18 (DRAPES) ×2 IMPLANT
DRAPE U-SHAPE 47X51 STRL (DRAPES) ×2 IMPLANT
DRSG ADAPTIC 3X8 NADH LF (GAUZE/BANDAGES/DRESSINGS) ×2 IMPLANT
DRSG EMULSION OIL 3X16 NADH (GAUZE/BANDAGES/DRESSINGS) ×2 IMPLANT
DRSG PAD ABDOMINAL 8X10 ST (GAUZE/BANDAGES/DRESSINGS) ×2 IMPLANT
DURAPREP 26ML APPLICATOR (WOUND CARE) ×2 IMPLANT
ELECT REM PT RETURN 9FT ADLT (ELECTROSURGICAL) ×2
ELECTRODE REM PT RTRN 9FT ADLT (ELECTROSURGICAL) ×1 IMPLANT
EVACUATOR 1/8 PVC DRAIN (DRAIN) ×2 IMPLANT
FACESHIELD LNG OPTICON STERILE (SAFETY) ×10 IMPLANT
GAUZE SPONGE 4X4 16PLY XRAY LF (GAUZE/BANDAGES/DRESSINGS) ×2 IMPLANT
GLOVE BIO SURGEON STRL SZ7.5 (GLOVE) ×2 IMPLANT
GLOVE BIO SURGEON STRL SZ8 (GLOVE) ×2 IMPLANT
GLOVE BIOGEL PI IND STRL 8 (GLOVE) ×2 IMPLANT
GLOVE BIOGEL PI INDICATOR 8 (GLOVE) ×2
GOWN STRL NON-REIN LRG LVL3 (GOWN DISPOSABLE) ×2 IMPLANT
GOWN STRL REIN XL XLG (GOWN DISPOSABLE) ×2 IMPLANT
HANDPIECE INTERPULSE COAX TIP (DISPOSABLE) ×1
IMMOBILIZER KNEE 20 (SOFTGOODS) ×2
IMMOBILIZER KNEE 20 THIGH 36 (SOFTGOODS) ×1 IMPLANT
KIT BASIN OR (CUSTOM PROCEDURE TRAY) ×2 IMPLANT
MANIFOLD NEPTUNE II (INSTRUMENTS) ×2 IMPLANT
NS IRRIG 1000ML POUR BTL (IV SOLUTION) ×2 IMPLANT
PACK TOTAL JOINT (CUSTOM PROCEDURE TRAY) ×2 IMPLANT
PAD ABD 7.5X8 STRL (GAUZE/BANDAGES/DRESSINGS) ×2 IMPLANT
PADDING CAST COTTON 6X4 STRL (CAST SUPPLIES) ×2 IMPLANT
POSITIONER SURGICAL ARM (MISCELLANEOUS) ×2 IMPLANT
SET HNDPC FAN SPRY TIP SCT (DISPOSABLE) ×1 IMPLANT
SPONGE GAUZE 4X4 12PLY (GAUZE/BANDAGES/DRESSINGS) ×2 IMPLANT
STRIP CLOSURE SKIN 1/2X4 (GAUZE/BANDAGES/DRESSINGS) ×4 IMPLANT
SUCTION FRAZIER 12FR DISP (SUCTIONS) ×2 IMPLANT
SUT MNCRL AB 4-0 PS2 18 (SUTURE) ×2 IMPLANT
SUT PDS AB 1 CT1 27 (SUTURE) ×6 IMPLANT
SUT VIC AB 2-0 CT1 27 (SUTURE) ×6
SUT VIC AB 2-0 CT1 TAPERPNT 27 (SUTURE) ×3 IMPLANT
SUT VLOC 180 0 24IN GS25 (SUTURE) ×2 IMPLANT
TOWEL OR 17X26 10 PK STRL BLUE (TOWEL DISPOSABLE) ×4 IMPLANT
TRAY FOLEY CATH 14FRSI W/METER (CATHETERS) ×2 IMPLANT
WATER STERILE IRR 1500ML POUR (IV SOLUTION) ×2 IMPLANT
WRAP KNEE MAXI GEL POST OP (GAUZE/BANDAGES/DRESSINGS) ×4 IMPLANT

## 2011-11-28 NOTE — Anesthesia Preprocedure Evaluation (Addendum)
Anesthesia Evaluation  Patient identified by MRN, date of birth, ID band Patient awake    Reviewed: Allergy & Precautions, H&P , NPO status , Patient's Chart, lab work & pertinent test results  Airway Mallampati: II TM Distance: >3 FB Neck ROM: Full    Dental No notable dental hx.    Pulmonary neg pulmonary ROS,  breath sounds clear to auscultation  Pulmonary exam normal       Cardiovascular negative cardio ROS  Rhythm:Regular Rate:Normal     Neuro/Psych negative neurological ROS  negative psych ROS   GI/Hepatic negative GI ROS, Neg liver ROS, GERD-  Medicated and Controlled,  Endo/Other  negative endocrine ROS  Renal/GU negative Renal ROS  negative genitourinary   Musculoskeletal negative musculoskeletal ROS (+)   Abdominal   Peds negative pediatric ROS (+)  Hematology negative hematology ROS (+)   Anesthesia Other Findings   Reproductive/Obstetrics negative OB ROS                           Anesthesia Physical Anesthesia Plan  ASA: II  Anesthesia Plan: General   Post-op Pain Management:    Induction: Intravenous  Airway Management Planned:   Additional Equipment:   Intra-op Plan:   Post-operative Plan: Extubation in OR  Informed Consent: I have reviewed the patients History and Physical, chart, labs and discussed the procedure including the risks, benefits and alternatives for the proposed anesthesia with the patient or authorized representative who has indicated his/her understanding and acceptance.   Dental advisory given  Plan Discussed with: CRNA  Anesthesia Plan Comments: (Pt refuses SAB)        Anesthesia Quick Evaluation

## 2011-11-28 NOTE — Anesthesia Postprocedure Evaluation (Signed)
  Anesthesia Post-op Note  Patient: Kathleen Velazquez  Procedure(s) Performed: Procedure(s) (LRB): TOTAL KNEE ARTHROPLASTY (Left)  Patient Location: PACU  Anesthesia Type: General  Level of Consciousness: awake and alert   Airway and Oxygen Therapy: Patient Spontanous Breathing  Post-op Pain: mild  Post-op Assessment: Post-op Vital signs reviewed, Patient's Cardiovascular Status Stable, Respiratory Function Stable, Patent Airway and No signs of Nausea or vomiting  Post-op Vital Signs: stable  Complications: No apparent anesthesia complications

## 2011-11-28 NOTE — Transfer of Care (Signed)
Immediate Anesthesia Transfer of Care Note  Patient: Kathleen Velazquez  Procedure(s) Performed: Procedure(s) (LRB): TOTAL KNEE ARTHROPLASTY (Left)  Patient Location: PACU  Anesthesia Type: General  Level of Consciousness: awake, pateint uncooperative and confused  Airway & Oxygen Therapy: Patient Spontanous Breathing and Patient connected to face mask oxygen  Post-op Assessment: Report given to PACU RN, Post -op Vital signs reviewed and stable and Patient moving all extremities  Post vital signs: Reviewed and stable  Complications: No apparent anesthesia complications

## 2011-11-28 NOTE — Plan of Care (Signed)
Problem: Consults Goal: Diagnosis- Total Joint Replacement Outcome: Completed/Met Date Met:  11/28/11 Left total knee

## 2011-11-28 NOTE — Op Note (Signed)
Pre-operative diagnosis- Osteoarthritis  Left knee(s)  Post-operative diagnosis- Osteoarthritis Left knee(s)  Procedure-  Left  Total Knee Arthroplasty  Surgeon- Gus Rankin. Nitza Schmid, MD  Assistant- Avel Peace, PA-C   Anesthesia-  General EBL-* No blood loss amount entered *  Drains Hemovac  Tourniquet time-  29 Minutes @ 300 mmHg Complications- None  Condition-PACU - hemodynamically stable.   Brief Clinical Note  Kathleen Velazquez is a 56 y.o. year old female with end stage OA of her left knee with progressively worsening pain and dysfunction. She has constant pain, with activity and at rest and significant functional deficits with difficulties even with ADLs. She has had extensive non-op management including analgesics, injections of cortisone and viscosupplements, and home exercise program, but remains in significant pain with significant dysfunction. Radiographs show bone on bone arthritis medial and patellofemoral compartments with subchondral sclerosis. She presents now for left Total Knee Arthroplasty.    Procedure in detail---   The patient is brought into the operating room and positioned supine on the operating table. After successful administration of  General,   a tourniquet is placed high on the  Left thigh(s) and the lower extremity is prepped and draped in the usual sterile fashion. Time out is performed by the operating team and then the  Left lower extremity is wrapped in Esmarch, knee flexed and the tourniquet inflated to 300 mmHg.       A midline incision is made with a ten blade through the subcutaneous tissue to the level of the extensor mechanism. A fresh blade is used to make a medial parapatellar arthrotomy. Soft tissue over the proximal medial tibia is subperiosteally elevated to the joint line with a knife and into the semimembranosus bursa with a Cobb elevator. Soft tissue over the proximal lateral tibia is elevated with attention being paid to avoiding the patellar  tendon on the tibial tubercle. The patella is everted, knee flexed 90 degrees and the ACL and PCL are removed. Findings are bone on bone medial and patellofemoral with large tibial osteophytes.        The drill is used to create a starting hole in the distal femur and the canal is thoroughly irrigated with sterile saline to remove the fatty contents. The 5 degree Left  valgus alignment guide is placed into the femoral canal and the distal femoral cutting block is pinned to remove 10 mm off the distal femur. Resection is made with an oscillating saw.      The tibia is subluxed forward and the menisci are removed. The extramedullary alignment guide is placed referencing proximally at the medial aspect of the tibial tubercle and distally along the second metatarsal axis and tibial crest. The block is pinned to remove 2mm off the more deficient medial  side. Resection is made with an oscillating saw. Size 2.5is the most appropriate size for the tibia and the proximal tibia is prepared with the modular drill and keel punch for that size.      The femoral sizing guide is placed and size 2.5 is most appropriate. Rotation is marked off the epicondylar axis and confirmed by creating a rectangular flexion gap at 90 degrees. The size 2.5 cutting block is pinned in this rotation and the anterior, posterior and chamfer cuts are made with the oscillating saw. The intercondylar block is then placed and that cut is made.      Trial size 2.5 tibial component, trial size 2.5 posterior stabilized femur and a 10  mm posterior  stabilized rotating platform insert trial is placed. Full extension is achieved with excellent varus/valgus and anterior/posterior balance throughout full range of motion. The patella is everted and thickness measured to be 22  mm. Free hand resection is taken to 12 mm, a 35 template is placed, lug holes are drilled, trial patella is placed, and it tracks normally. Osteophytes are removed off the posterior  femur with the trial in place. All trials are removed and the cut bone surfaces prepared with pulsatile lavage. Cement is mixed and once ready for implantation, the size 2.5 tibial implant, size  2.5 posterior stabilized femoral component, and the size 35 patella are cemented in place and the patella is held with the clamp. The trial insert is placed and the knee held in full extension. All extruded cement is removed and once the cement is hard the permanent 10 mm posterior stabilized rotating platform insert is placed into the tibial tray.      The wound is copiously irrigated with saline solution and the extensor mechanism closed over a hemovac drain with #1 PDS suture. The tourniquet is released for a total tourniquet time of 29  minutes. Flexion against gravity is 135 degrees and the patella tracks normally. Subcutaneous tissue is closed with 2.0 vicryl and subcuticular with running 4.0 Monocryl. The catheter for the Marcaine pain pump is placed and the pump is initiated. The incision is cleaned and dried and steri-strips and a bulky sterile dressing are applied. The limb is placed into a knee immobilizer and the patient is awakened and transported to recovery in stable condition.      Please note that a surgical assistant was a medical necessity for this procedure in order to perform it in a safe and expeditious manner. Surgical assistant was necessary to retract the ligaments and vital neurovascular structures to prevent injury to them and also necessary for proper positioning of the limb to allow for anatomic placement of the prosthesis.   Gus Rankin Aizik Reh, MD    11/28/2011, 9:33 AM

## 2011-11-28 NOTE — Interval H&P Note (Signed)
History and Physical Interval Note:  11/28/2011 8:25 AM  Kathleen Velazquez  has presented today for surgery, with the diagnosis of Osteoarthritis of the Left Knee  The various methods of treatment have been discussed with the patient and family. After consideration of risks, benefits and other options for treatment, the patient has consented to  Procedure(s) (LRB): TOTAL KNEE ARTHROPLASTY (Left) as a surgical intervention .  The patients' history has been reviewed, patient examined, no change in status, stable for surgery.  I have reviewed the patients' chart and labs.  Questions were answered to the patient's satisfaction.     Loanne Drilling

## 2011-11-28 NOTE — Progress Notes (Signed)
CARE MANAGEMENT NOTE 11/28/2011  Patient:  Kathleen Velazquez   Account Number:  [id number]  Date Initiated:  11/23/2011  Documentation initiated by:  Colleen Can  Subjective/Objective Assessment:   DX LEFT SIDED WEAKNESS, dizziness, headache     Action/Plan:   CM spoke with patient regarding d/c plans. States her mother and spouse will offer support. pt/ot eval completed-pt states determination will be made tomorrow if she needs home therapy   Anticipated DC Date:  11/23/2011   Anticipated DC Plan:  HOME/SELF CARE  In-house referral  Financial Counselor  Clinical Social Worker      DC Planning Services  CM consult      Choice offered to / List presented to:  C-1 Patient   DME arranged  3-N-1  WALKER - ROLLING  WHEELCHAIR - MANUAL      DME agency  [facility]     HH arranged  HH-2 PT  HH-6 SOCIAL WORKER  HH-4 NURSE'S AIDE      HH agency  [facility]   Status of service:  Completed, signed off Medicare Important Message given?  NO (If response is "NO", the following Medicare IM given date fields will be blank) Date Medicare IM given:   Date Additional Medicare IM given:    Discharge Disposition:  HOME/ with hh servicres  Per UR Regulation:  Reviewed for med. necessity/level of care/duration of stay  If discussed at Long Length of Stay Meetings, dates discussed:    Comments:  11/28/2011 Raynelle Bring BSN CCM [phone] Additional dme requested -RW, wheelchair; advanced notified. wheelchair to be delivered to patient's hoome; rw delivered to rm. Pt for discharge today with mother and spouse as caregivers.[facility] services in place

## 2011-11-28 NOTE — Preoperative (Signed)
Beta Blockers   Reason not to administer Beta Blockers:Not Applicable 

## 2011-11-29 DIAGNOSIS — D62 Acute posthemorrhagic anemia: Secondary | ICD-10-CM | POA: Diagnosis not present

## 2011-11-29 LAB — BASIC METABOLIC PANEL
BUN: 12 mg/dL (ref 6–23)
Creatinine, Ser: 0.72 mg/dL (ref 0.50–1.10)
GFR calc non Af Amer: >90 mL/min (ref >90)
Glucose, Bld: 137 mg/dL — ABNORMAL HIGH (ref 70–99)
Potassium: 4.1 mEq/L (ref 3.5–5.1)

## 2011-11-29 LAB — CBC
HCT: 27.9 % — ABNORMAL LOW (ref 36.0–46.0)
Hemoglobin: 9.2 g/dL — ABNORMAL LOW (ref 12.0–15.0)
MCH: 31.2 pg (ref 26.0–34.0)
MCHC: 33 g/dL (ref 30.0–36.0)
MCV: 94.6 fL (ref 78.0–100.0)

## 2011-11-29 MED ORDER — SODIUM CHLORIDE 0.9 % IV BOLUS (SEPSIS)
500.0000 mL | Freq: Once | INTRAVENOUS | Status: AC
  Administered 2011-11-30: 500 mL via INTRAVENOUS

## 2011-11-29 MED ORDER — POLYSACCHARIDE IRON COMPLEX 150 MG PO CAPS
150.0000 mg | ORAL_CAPSULE | Freq: Every day | ORAL | Status: DC
  Administered 2011-11-29 – 2011-12-03 (×5): 150 mg via ORAL
  Filled 2011-11-29 (×5): qty 1

## 2011-11-29 MED ORDER — MORPHINE SULFATE 2 MG/ML IJ SOLN
1.0000 mg | INTRAMUSCULAR | Status: DC | PRN
  Administered 2011-11-29: 2 mg via INTRAVENOUS
  Filled 2011-11-29: qty 1

## 2011-11-29 NOTE — Progress Notes (Signed)
Subjective: 1 Day Post-Op Procedure(s) (LRB): TOTAL KNEE ARTHROPLASTY (Left) Patient reports pain as mild.   Patient seen in rounds with Dr. Lequita Halt. Patient is well, and has had no acute complaints or problems We will start therapy today.  Plan is to go Home after hospital stay.  Objective: Vital signs in last 24 hours: Temp:  [97.1 F (36.2 C)-97.9 F (36.6 C)] 97.6 F (36.4 C) (04/23 0431) Pulse Rate:  [72-106] 106  (04/23 0431) Resp:  [11-25] 14  (04/23 0431) BP: (97-151)/(68-93) 97/72 mmHg (04/23 0431) SpO2:  [90 %-100 %] 99 % (04/23 0431) Weight:  [60.782 kg (134 lb)] 60.782 kg (134 lb) (04/22 1215)  Intake/Output from previous day:  Intake/Output Summary (Last 24 hours) at 11/29/11 0843 Last data filed at 11/29/11 0730  Gross per 24 hour  Intake   4350 ml  Output   3225 ml  Net   1125 ml    Intake/Output this shift: Total I/O In: 240 [P.O.:240] Out: -   Labs:  Basename 11/29/11 0430  HGB 9.2*    Basename 11/29/11 0430  WBC 8.3  RBC 2.95*  HCT 27.9*  PLT 156    Basename 11/29/11 0430  NA 136  K 4.1  CL 103  CO2 27  BUN 12  CREATININE 0.72  GLUCOSE 137*  CALCIUM 8.3*   No results found for this basename: LABPT:2,INR:2 in the last 72 hours  EXAM General - Patient is Alert, Appropriate and Oriented Extremity - Neurovascular intact Sensation intact distally Dressing - dressing C/D/I Motor Function - intact, moving foot and toes well on exam.  Hemovac pulled without difficulty.  Past Medical History  Diagnosis Date  . GERD (gastroesophageal reflux disease)   . Headache     hx of migraines   . Arthritis     knees,   . Anxiety     Assessment/Plan: 1 Day Post-Op Procedure(s) (LRB): TOTAL KNEE ARTHROPLASTY (Left) Principal Problem:  *OA (osteoarthritis) of knee Active Problems:  Postop Acute blood loss anemia   Advance diet Up with therapy Discharge home with home health  DVT Prophylaxis - Xarelto Weight-Bearing as tolerated to  left leg Keep foley until tomorrow. No vaccines. D/C PCA Morphine, Change to IV push D/C O2 and Pulse OX and try on Room [address]  Patrica Duel 11/29/2011, 8:43 AM

## 2011-11-29 NOTE — Progress Notes (Signed)
11/29/11 2355 Nursing Oneida Alar PA called reg: patient's consistent pulse rate of 131-132 tonight, despite prescribed pain meds. Patient resting well after pain meds. Order for NS Bolus 500cc ordered. Will continue to monitor patient.

## 2011-11-29 NOTE — Evaluation (Signed)
Physical Therapy Evaluation Patient Details Name: Kathleen Velazquez MRN: [id number] DOB: 03/23/1956 Today's Date: 11/29/2011 Time: 1000-1030 PT Time Calculation (min): 30 min  PT Assessment / Plan / Recommendation Clinical Impression  Pt with L TKR presents with decreased L LE strength/ROM and limited functional mobility.    PT Assessment  Patient needs continued PT services    Follow Up Recommendations  Home health PT    Equipment Recommendations  Rolling walker with 5" wheels (pt is 5'2")    Frequency 7X/week    Precautions / Restrictions Precautions Precautions: Knee Required Braces or Orthoses: Knee Immobilizer - Left Knee Immobilizer - Left: Discontinue once straight leg raise with < 10 degree lag Restrictions Weight Bearing Restrictions: No         Mobility  Bed Mobility Bed Mobility: Supine to Sit Supine to Sit: 4: Min assist Details for Bed Mobility Assistance: cues for sequence with assist required for L LE Transfers Transfers: Sit to Stand;Stand to Sit Sit to Stand: 4: Min assist Stand to Sit: 4: Min assist Details for Transfer Assistance: cues for LE management and use of UEs to self assist Ambulation/Gait Ambulation/Gait Assistance: 4: Min assist Ambulation Distance (Feet): 58 Feet Assistive device: Rolling walker Ambulation/Gait Assistance Details: cues for posture, sequence, and position from RW Gait Pattern: Step-to pattern    Exercises Total Joint Exercises Ankle Circles/Pumps: AROM;Both;10 reps;Supine Quad Sets: AROM;Both;10 reps;Supine Heel Slides: AAROM;10 reps;Left;Supine Straight Leg Raises: AAROM;10 reps;Supine;Left   PT Goals Acute Rehab PT Goals PT Goal Formulation: With patient Time For Goal Achievement: 12/06/11 Potential to Achieve Goals: Good Pt will go Supine/Side to Sit: with supervision PT Goal: Supine/Side to Sit - Progress: Goal set today Pt will go Sit to Supine/Side: with supervision PT Goal: Sit to Supine/Side - Progress:  Goal set today Pt will go Sit to Stand: with supervision PT Goal: Sit to Stand - Progress: Goal set today Pt will go Stand to Sit: with supervision PT Goal: Stand to Sit - Progress: Goal set today Pt will Ambulate: 51 - 150 feet;with supervision;with rolling walker PT Goal: Ambulate - Progress: Goal set today Pt will Go Up / Down Stairs: 3-5 stairs;with min assist;with least restrictive assistive device PT Goal: Up/Down Stairs - Progress: Goal set today  Visit Information  Last PT Received On: 11/29/11 Assistance Needed: +1    Subjective Data  Subjective: I'm ready to do this Patient Stated Goal: Resume previous lifestyle with decreased pain   Prior Functioning  Home Living Lives With: Spouse Available Help at Discharge: Family Type of Home: House Home Access: Stairs to enter Secretary/administrator of Steps: 3 Entrance Stairs-Rails: Right;Left;Can reach both Home Layout: Two level Alternate Level Stairs-Number of Steps: 1 Alternate Level Stairs-Rails: None Prior Function Level of Independence: Independent Able to Take Stairs?: Yes Driving: Yes Communication Communication: No difficulties    Cognition  Overall Cognitive Status: Appears within functional limits for tasks assessed/performed Arousal/Alertness: Awake/alert Orientation Level: Appears intact for tasks assessed Behavior During Session: [facility] for tasks performed    Extremity/Trunk Assessment Right Upper Extremity Assessment RUE ROM/Strength/Tone: Within functional levels RUE Coordination: WFL - gross motor Left Upper Extremity Assessment LUE ROM/Strength/Tone: Within functional levels LUE Coordination: WFL - gross motor Right Lower Extremity Assessment RLE ROM/Strength/Tone: Deficits RLE ROM/Strength/Tone Deficits: limited to 90 flex at knee since previous TKR RLE Coordination: WFL - gross motor Left Lower Extremity Assessment LLE ROM/Strength/Tone: Deficits LLE ROM/Strength/Tone Deficits: -10 - 50 AAROM at  knee; 2+/5 quad strength   Balance  End of Session PT - End of Session Equipment Utilized During Treatment: Left knee immobilizer Activity Tolerance: Patient tolerated treatment well Patient left: in chair;with call bell/phone within reach Nurse Communication: Mobility status   Nur Krasinski 11/29/2011, 12:19 PM

## 2011-11-29 NOTE — Progress Notes (Signed)
Physical Therapy Treatment Patient Details Name: Kathleen Velazquez MRN: [id number] DOB: 30-Jan-1956 Today's Date: 11/29/2011 Time: 4098-1191 PT Time Calculation (min): 24 min  PT Assessment / Plan / Recommendation Comments on Treatment Session  Pt very motivated    Follow Up Recommendations  Home health PT    Equipment Recommendations  Rolling walker with 5" wheels    Frequency 7X/week   Plan Discharge plan remains appropriate    Precautions / Restrictions Precautions Precautions: Knee Required Braces or Orthoses: Knee Immobilizer - Left Knee Immobilizer - Left: Discontinue once straight leg raise with < 10 degree lag Restrictions Weight Bearing Restrictions: No   Pertinent Vitals/Pain     Mobility  Bed Mobility Sit to Supine: 4: Min assist Details for Bed Mobility Assistance: cues for sequence with assist required for L LE Transfers Sit to Stand: 4: Min assist Stand to Sit: 4: Min assist Details for Transfer Assistance: cues for LE management and use of UEs to self assist Ambulation/Gait Ambulation/Gait Assistance: 4: Min assist Ambulation Distance (Feet): 95 Feet (95' x 2) Assistive device: Rolling walker Ambulation/Gait Assistance Details: cues for posture, sequence, and position from RW Gait Pattern: Step-to pattern    Exercises     PT Goals Acute Rehab PT Goals PT Goal Formulation: With patient Time For Goal Achievement: 12/06/11 Potential to Achieve Goals: Good Pt will go Supine/Side to Sit: with supervision PT Goal: Supine/Side to Sit - Progress: Progressing toward goal Pt will go Sit to Supine/Side: with supervision PT Goal: Sit to Supine/Side - Progress: Progressing toward goal Pt will go Sit to Stand: with supervision PT Goal: Sit to Stand - Progress: Progressing toward goal Pt will go Stand to Sit: with supervision PT Goal: Stand to Sit - Progress: Progressing toward goal Pt will Ambulate: 51 - 150 feet;with supervision;with rolling walker PT Goal:  Ambulate - Progress: Progressing toward goal Pt will Go Up / Down Stairs: 3-5 stairs;with min assist;with least restrictive assistive device PT Goal: Up/Down Stairs - Progress: Goal set today  Visit Information  Last PT Received On: 11/29/11 Assistance Needed: +1    Subjective Data  Subjective:  (I'm going for it)   Cognition  Overall Cognitive Status: Appears within functional limits for tasks assessed/performed Arousal/Alertness: Awake/alert Orientation Level: Appears intact for tasks assessed Behavior During Session: [facility] for tasks performed    Balance     End of Session PT - End of Session Equipment Utilized During Treatment: Left knee immobilizer Activity Tolerance: Patient tolerated treatment well Patient left: in bed;with call bell/phone within reach Nurse Communication: Mobility status    Torrie Namba 11/29/2011, 2:26 PM

## 2011-11-29 NOTE — Progress Notes (Signed)
OT Note:  Pt screened for OT.  She has high commode and 3:1, will sponge bathe until she can step into tub and access seat and has family to assist with ADLs prn.  [place], [place] [phone] 11/29/2011

## 2011-11-30 LAB — BASIC METABOLIC PANEL
BUN: 9 mg/dL (ref 6–23)
Calcium: 8.6 mg/dL (ref 8.4–10.5)
GFR calc non Af Amer: >90 mL/min (ref >90)
Glucose, Bld: 107 mg/dL — ABNORMAL HIGH (ref 70–99)
Sodium: 136 mEq/L (ref 135–145)

## 2011-11-30 LAB — CBC
Hemoglobin: 8.3 g/dL — ABNORMAL LOW (ref 12.0–15.0)
MCH: 31.6 pg (ref 26.0–34.0)
MCHC: 33.2 g/dL (ref 30.0–36.0)
RDW: 12.5 % (ref 11.5–15.5)

## 2011-11-30 LAB — PREPARE RBC (CROSSMATCH)

## 2011-11-30 MED ORDER — FUROSEMIDE 10 MG/ML IJ SOLN
10.0000 mg | Freq: Once | INTRAMUSCULAR | Status: AC
  Administered 2011-11-30 (×2): 10 mg via INTRAVENOUS
  Filled 2011-11-30: qty 1

## 2011-11-30 MED ORDER — ACETAMINOPHEN 325 MG PO TABS
650.0000 mg | ORAL_TABLET | Freq: Once | ORAL | Status: AC
  Administered 2011-11-30: 650 mg via ORAL
  Filled 2011-11-30: qty 2

## 2011-11-30 NOTE — Progress Notes (Signed)
Physical Therapy Treatment Patient Details Name: Kathleen Velazquez MRN: [id number] DOB: [birth year] Today's Date: 11/30/2011 Time: 4098-1191 PT Time Calculation (min): 29 min  PT Assessment / Plan / Recommendation Comments on Treatment Session  Pt very motivated    Follow Up Recommendations  Home health PT    Equipment Recommendations  Rolling walker with 5" wheels    Frequency 7X/week   Plan Discharge plan remains appropriate    Precautions / Restrictions Precautions Precautions: Knee Required Braces or Orthoses: Knee Immobilizer - Left Knee Immobilizer - Left: Discontinue once straight leg raise with < 10 degree lag Restrictions Weight Bearing Restrictions: No   Pertinent Vitals/Pain     Mobility  Bed Mobility Bed Mobility: Supine to Sit Supine to Sit: 4: Min assist Details for Bed Mobility Assistance: cues for sequence with assist required for L LE Transfers Transfers: Sit to Stand;Stand to Sit Sit to Stand: 4: Min assist Stand to Sit: 4: Min assist Details for Transfer Assistance: cues for LE management and use of UEs to self assist Ambulation/Gait Ambulation/Gait Assistance: 4: Min Environmental consultant (Feet): 100 Feet Assistive device: Rolling walker Ambulation/Gait Assistance Details: cues for posture and position from RW Gait Pattern: Step-to pattern    Exercises Total Joint Exercises Ankle Circles/Pumps: AROM;Both;20 reps;Supine Quad Sets: AROM;Both;20 reps;Supine Heel Slides: AAROM;20 reps;Left;Supine Straight Leg Raises: AAROM;20 reps;Left;Supine   PT Goals Acute Rehab PT Goals PT Goal Formulation: With patient Time For Goal Achievement: 12/06/11 Potential to Achieve Goals: Good Pt will go Supine/Side to Sit: with supervision PT Goal: Supine/Side to Sit - Progress: Progressing toward goal Pt will go Sit to Supine/Side: with supervision PT Goal: Sit to Supine/Side - Progress: Progressing toward goal Pt will go Sit to Stand: with supervision PT  Goal: Sit to Stand - Progress: Progressing toward goal Pt will go Stand to Sit: with supervision PT Goal: Stand to Sit - Progress: Progressing toward goal Pt will Ambulate: 51 - 150 feet;with supervision;with rolling walker PT Goal: Ambulate - Progress: Progressing toward goal  Visit Information  Last PT Received On: 11/30/11 Assistance Needed: +1    Subjective Data  Subjective: I'm just not having a good day Patient Stated Goal: Resume previous lifestyle with decreased pain   Cognition  Overall Cognitive Status: Appears within functional limits for tasks assessed/performed Arousal/Alertness: Awake/alert Orientation Level: Appears intact for tasks assessed Behavior During Session: [facility] for tasks performed    Balance     End of Session PT - End of Session Equipment Utilized During Treatment: Left knee immobilizer Activity Tolerance: Patient tolerated treatment well Patient left: in chair;with call bell/phone within reach Nurse Communication: Mobility status CPM Left Knee CPM Left Knee: Off    Karson Reede 11/30/2011, 11:30 AM

## 2011-11-30 NOTE — Progress Notes (Signed)
11/30/11 0200 Nursing Oneida Alar PA called reg patient's pulse of 140 at this time. Order received for EKG. Ekg showed pulse of 130. Sinus tachycardia. No other orders received Eustace Moore RN

## 2011-11-30 NOTE — Progress Notes (Signed)
Subjective: 2 Days Post-Op Procedure(s) (LRB): TOTAL KNEE ARTHROPLASTY (Left) Patient reports pain as mild.   Patient seen in rounds with Dr. Lequita Halt. Patient is well, but has had some minor complaints of fast heart rate that started last night.  An EKG was performed last evening and showed sinus tach.  The patient denies any chest pain, SOB, nausea, vomiting, palpitations or other symptoms.  She did state that it may have happened in the past.  She did OK was her therapy and did OK with the fluid bolus last night.  Recommended blood today. Recheck in AM.  Also checking TSH level. Plan is to go Home after hospital stay.  Objective: Vital signs in last 24 hours: Temp:  [98.9 F (37.2 C)-100 F (37.8 C)] 99.3 F (37.4 C) (04/24 2105) Pulse Rate:  [116-140] 117  (04/24 2105) Resp:  [16-18] 16  (04/24 2105) BP: (117-160)/(75-93) 160/90 mmHg (04/24 2105) SpO2:  [90 %-97 %] 95 % (04/24 0530)  Intake/Output from previous day:  Intake/Output Summary (Last 24 hours) at 11/30/11 2156 Last data filed at 11/30/11 2030  Gross per 24 hour  Intake   2530 ml  Output   3075 ml  Net   -545 ml    Intake/Output this shift: Total I/O In: 250 [I.V.:250] Out: -   Labs:  Basename 11/30/11 0422 11/29/11 0430  HGB 8.3* 9.2*    Basename 11/30/11 0422 11/29/11 0430  WBC 7.4 8.3  RBC 2.63* 2.95*  HCT 25.0* 27.9*  PLT 143* 156    Basename 11/30/11 0422 11/29/11 0430  NA 136 136  K 4.3 4.1  CL 102 103  CO2 28 27  BUN 9 12  CREATININE 0.66 0.72  GLUCOSE 107* 137*  CALCIUM 8.6 8.3*   No results found for this basename: LABPT:2,INR:2 in the last 72 hours  EXAM General - Patient is Alert, Appropriate and Oriented Extremity - Neurovascular intact Sensation intact distally Dressing/Incision - clean, dry, no drainage, healing Motor Function - intact, moving foot and toes well on exam.   Past Medical History  Diagnosis Date  . GERD (gastroesophageal reflux disease)   . Headache     hx  of migraines   . Arthritis     knees,   . Anxiety     Assessment/Plan: 2 Days Post-Op Procedure(s) (LRB): TOTAL KNEE ARTHROPLASTY (Left) Principal Problem:  *OA (osteoarthritis) of knee Active Problems:  Postop Acute blood loss anemia   Up with therapy Discharge home with home health  DVT Prophylaxis - Xarelto Weight-Bearing as tolerated to left leg  Koral Thaden 11/30/2011, 9:56 PM

## 2011-12-01 DIAGNOSIS — I498 Other specified cardiac arrhythmias: Secondary | ICD-10-CM

## 2011-12-01 LAB — CBC
MCH: 30.3 pg (ref 26.0–34.0)
Platelets: 156 10*3/uL (ref 150–400)
RBC: 3.63 MIL/uL — ABNORMAL LOW (ref 3.87–5.11)
WBC: 7.3 10*3/uL (ref 4.0–10.5)

## 2011-12-01 LAB — TYPE AND SCREEN

## 2011-12-01 MED ORDER — METOPROLOL SUCCINATE ER 25 MG PO TB24
25.0000 mg | ORAL_TABLET | Freq: Every day | ORAL | Status: DC
  Administered 2011-12-01 – 2011-12-03 (×3): 25 mg via ORAL
  Filled 2011-12-01 (×3): qty 1

## 2011-12-01 NOTE — Progress Notes (Signed)
CARE MANAGEMENT NOTE 12/01/2011  Patient:  Kathleen Velazquez, Kathleen Velazquez   Account Number:  [id number]  Date Initiated:  11/30/2011  Documentation initiated by:  Colleen Can  Subjective/Objective Assessment:   dx ostoarthritis left knee; tptal knee replacemnt     Action/Plan:   CM spoke with patient. Plans are for her to go back home to [facility] where she will have family as caregivers. Pt does want agency in network if possible.   Anticipated DC Date:  12/02/2011   Anticipated DC Plan:  HOME W HOME HEALTH SERVICES  In-house referral  NA      DC Planning Services  CM consult      Choice offered to / List presented to:  C-1 Patient   DME arranged  [facility]      DME agency  OTHER - SEE NOTE        Status of service:  In process, will continue to follow Medicare Important Message given?   (If response is "NO", the following Medicare IM given date fields will be blank) Date Medicare IM given:   Date Additional Medicare IM given:    Discharge Disposition:    Per UR Regulation:    If discussed at Long Length of Stay Meetings, dates discussed:    Comments:  04/25/29013 Raynelle Bring BSN CCM [phone] Has been placed in [facility] agency search [facility] does not have availability of PT in area [facility] does not service area. Genevieve Norlander is not in [employer] does not have PT availability [facility] out of network [facility], [facility], [facility] [place], [facility] [address] [place] are all out of [place] and donot service [place] Pt states she did outpatient physical therapy with last surgery and used [facility] in [place]. States that she does have transportation to clinic per family member and feels comfortable with this plan. Physician Asistant -D. Perkins notified of the above information and will follow patient and write prescription for outpatient PT if needed upon discharge. CM to follow

## 2011-12-01 NOTE — Progress Notes (Signed)
12/01/11 1300  PT Visit Information  Last PT Received On 12/01/11  Assistance Needed +1  PT Time Calculation  PT Start Time 1300  PT Stop Time 1321  PT Time Calculation (min) 21 min  Subjective Data  Subjective Everyone has been so nice  Precautions  Precautions Knee  Required Braces or Orthoses Knee Immobilizer - Left  Knee Immobilizer - Left Discontinue once straight leg raise with < 10 degree lag  Restrictions  LLE Weight Bearing WBAT  Cognition  Overall Cognitive Status Appears within functional limits for tasks assessed/performed  Arousal/Alertness Awake/alert  Orientation Level Appears intact for tasks assessed  Behavior During Session [facility] for tasks performed  Bed Mobility  Sit to Supine 4: Min assist;HOB flat  Details for Bed Mobility Assistance cues for sequence with assist required for L LE  Transfers  Transfers Sit to Stand;Stand to Sit  Sit to Stand 5: Supervision;With upper extremity assist;From chair/3-in-1  Stand to Sit 5: Supervision;With upper extremity assist;To bed  Details for Transfer Assistance verbal cues for hand placement  Ambulation/Gait  Ambulation/Gait Assistance 5: Supervision  Ambulation Distance (Feet) 6 Feet  Assistive device Rolling walker  Ambulation/Gait Assistance Details Cues for sequence and upright posture. Tends to watch feet.  Gait Pattern Step-to pattern  General Gait Details greater distance amb deferred due to increased HR  Exercises  Exercises Total Joint  Total Joint Exercises  Ankle Circles/Pumps AROM;Both;20 reps;Supine  Quad Sets AROM;Both;20 reps;Supine  Heel Slides AAROM;Left;10 reps  Short Arc [place];Left;10 reps  Hip ABduction/ADduction AAROM;AROM;Left;10 reps  PT - End of Session  Equipment Utilized During Treatment Left knee immobilizer  Activity Tolerance Patient tolerated treatment well;Treatment limited secondary to medical complications (Comment)  Patient left in bed;with call bell/phone within reach;with  family/visitor present  PT - Assessment/Plan  Comments on Treatment Session pt cont with increased HR, activity limited by PT...HR initally 127, after return to bed and ther ex HR119, sats >97% on RA  PT Plan Discharge plan remains appropriate;Frequency remains appropriate  PT Frequency 7X/week  Follow Up Recommendations Home health PT  Equipment Recommended Rolling walker with 5" wheels  Acute Rehab PT Goals  Time For Goal Achievement 12/06/11  Potential to Achieve Goals Good  Pt will go Supine/Side to Sit with supervision  Pt will go Sit to Supine/Side with supervision  PT Goal: Sit to Supine/Side - Progress Progressing toward goal  Pt will go Sit to Stand with supervision  PT Goal: Sit to Stand - Progress Met  Pt will go Stand to Sit with supervision  PT Goal: Stand to Sit - Progress Met

## 2011-12-01 NOTE — Progress Notes (Signed)
Physical Therapy Treatment Patient Details Name: Kathleen Velazquez MRN: [id number] DOB: [birth year] Today's Date: 12/01/2011 Time: 4098-1191 PT Time Calculation (min): 33 min  PT Assessment / Plan / Recommendation Comments on Treatment Session  Pt's HR after ambulation 140 bpm but decreased back to 124 within several minutes.  Pt motivated to increase mobility.  Doing well with L LE ROM.    Follow Up Recommendations  Home health PT    Equipment Recommendations  Rolling walker with 5" wheels    Frequency 7X/week   Plan Discharge plan remains appropriate    Precautions / Restrictions Precautions Precautions: Knee Required Braces or Orthoses: Knee Immobilizer - Left Knee Immobilizer - Left: Discontinue once straight leg raise with < 10 degree lag Restrictions Weight Bearing Restrictions: No   Pertinent Vitals/Pain BP prior to treatment-156/104, after treatment-158/94.  RHR 125, EHR 140. Pain 3/10 L Knee-Ice provided.   Mobility  Transfers Sit to Stand: 5: Supervision;With upper extremity assist;From bed Stand to Sit: 5: Supervision;With upper extremity assist;To bed Details for Transfer Assistance: Cues to push up from bed Ambulation/Gait Ambulation/Gait Assistance: 5: Supervision Ambulation Distance (Feet): 180 Feet Assistive device: Rolling walker Ambulation/Gait Assistance Details: Cues for sequence and upright posture.  Tends to watch feet. Gait Pattern: Step-to pattern    Exercises Total Joint Exercises Ankle Circles/Pumps: AROM;Both;20 reps;Supine Long Arc Quad: Left;10 reps;Seated;AROM Knee Flexion: AROM;Left;10 reps;Seated   PT Goals Acute Rehab PT Goals Time For Goal Achievement: 12/06/11 Potential to Achieve Goals: Good PT Goal: Sit to Stand - Progress: Progressing toward goal PT Goal: Stand to Sit - Progress: Progressing toward goal PT Goal: Ambulate - Progress: Progressing toward goal  Visit Information  Last PT Received On: 12/01/11 Assistance Needed:  +1    Subjective Data  Subjective: "They can't get my heart rate down".   Cognition  Overall Cognitive Status: Appears within functional limits for tasks assessed/performed Arousal/Alertness: Awake/alert Orientation Level: Appears intact for tasks assessed Behavior During Session: [facility] for tasks performed    Balance     End of Session PT - End of Session Equipment Utilized During Treatment: Left knee immobilizer Activity Tolerance: Patient tolerated treatment well Patient left: in bed;with call bell/phone within reach Nurse Communication: Mobility status    Newell Coral 12/01/2011, 12:47 PM  Newell Coral, PTA Acute Rehab [phone] (office)

## 2011-12-01 NOTE — Progress Notes (Signed)
Subjective: 3 Days Post-Op Procedure(s) (LRB): TOTAL KNEE ARTHROPLASTY (Left) Patient reports pain as mild.   Patient seen in rounds with Dr. Lequita Halt. Patient is well, and has had no acute complaints or problems except for the fast heart rate.  She has been asymptomatic with the rate. She denies any CP, SOB, nausea, vomiting, presyncope, or other symptoms.  She was anemic yesterday which she received two units of blood.  HGB came up from 8.3 to 11.0.  Rate is still fast today.  She is 95-97% on O2 sats on room air.  She has not had any respiratory issues or complaints.  She did give me a history of seeing Dr. Andee Lineman years ago and had to wear what sounded like a holter monitor.  She does not recall being told that she had any specific issues.  We also checked a TSH level yesterday which was found to be normal.  Plan is to go Home after hospital stay but concerned about the tachycardia.  Dr. Lequita Halt discussed this with her at length this morning and we will ask Cardiology to come by and evaluate her.  Objective: Vital signs in last 24 hours: Temp:  [98.6 F (37 C)-100 F (37.8 C)] 98.7 F (37.1 C) (04/25 0520) Pulse Rate:  [116-133] 127  (04/25 0520) Resp:  [16-22] 19  (04/25 0520) BP: (117-160)/(75-98) 150/98 mmHg (04/25 0520)  Intake/Output from previous day:  Intake/Output Summary (Last 24 hours) at 12/01/11 0910 Last data filed at 12/01/11 0800  Gross per 24 hour  Intake 2439.17 ml  Output   3500 ml  Net -1060.83 ml    Intake/Output this shift: Total I/O In: 240 [P.O.:240] Out: -   Labs:  Basename 12/01/11 0443 11/30/11 0422 11/29/11 0430  HGB 11.0* 8.3* 9.2*    Basename 12/01/11 0443 11/30/11 0422  WBC 7.3 7.4  RBC 3.63* 2.63*  HCT 33.0* 25.0*  PLT 156 143*    Basename 11/30/11 0422 11/29/11 0430  NA 136 136  K 4.3 4.1  CL 102 103  CO2 28 27  BUN 9 12  CREATININE 0.66 0.72  GLUCOSE 107* 137*  CALCIUM 8.6 8.3*   No results found for this basename:  LABPT:2,INR:2 in the last 72 hours  EXAM General - Patient is Alert, Appropriate and Oriented Extremity - Neurovascular intact Sensation intact distally Dressing/Incision - clean, dry, no drainage, healing Motor Function - intact, moving foot and toes well on exam.   Past Medical History  Diagnosis Date  . GERD (gastroesophageal reflux disease)   . Headache     hx of migraines   . Arthritis     knees,   . Anxiety     Assessment/Plan: 3 Days Post-Op Procedure(s) (LRB): TOTAL KNEE ARTHROPLASTY (Left) Principal Problem:  *OA (osteoarthritis) of knee Active Problems:  Postop Acute blood loss anemia   Up with therapy Cardiology Consult  DVT Prophylaxis - Xarelto Weight-Bearing as tolerated to left leg  PERKINS, ALEXZANDREW 12/01/2011, 9:10 AM

## 2011-12-01 NOTE — Consult Note (Signed)
CARDIOLOGY CONSULT NOTE  Patient ID: Kathleen Velazquez MRN: [id number] DOB/AGE: 11-23-1955 56 y.o.  Admit date: 11/28/2011 Referring Physician Primary Galen Daft, MD, MD Primary Cardiologist  Narda Rutherford Reason for Consultation  HPI:  The patient is seen in consultation for the evaluation of persistent sinus tachycardia. In addition she is seen for an abnormal EKG. The patient relates that in the past she did see a cardiologist and she wore a Holter monitor for several days. She was told that there was no problem. She's not had any cardiac symptoms. She's not having chest pain or shortness of breath. She does not sense any palpitations.  Review of systems:    Patient denies fever, chills, headache, sweats, rash, change in vision, change in hearing, chest pain, cough, nausea vomiting, urinary symptoms. All other systems are reviewed and are negative.  Past Medical History  Diagnosis Date  . GERD (gastroesophageal reflux disease)   . Headache     hx of migraines   . Arthritis     knees,   . Anxiety     History reviewed. No pertinent family history.  History   Social History  . Marital Status: Married    Spouse Name: N/A    Number of Children: N/A  . Years of Education: N/A   Occupational History  . Not on file.   Social History Main Topics  . Smoking status: Never Smoker   . Smokeless tobacco: Never Used  . Alcohol Use: No     recovering alcoholic x 6 years   . Drug Use: No  . Sexually Active:    Other Topics Concern  . Not on file   Social History Narrative  . No narrative on file    Past Surgical History  Procedure Date  . Cholecystectomy   . Tubal ligation   . Joint replacement     right knee replacement   . Other surgical history     kleft breast biopsy - benign   . Other surgical history     hx of skin cancer surgery      Prescriptions prior to admission  Medication Sig Dispense Refill  . desvenlafaxine (PRISTIQ) 50 MG 24 hr tablet Take  50 mg by mouth every morning.      . dicyclomine (BENTYL) 10 MG capsule Take 10 mg by mouth 4 (four) times daily.      Marland Kitchen estrogen, conjugated,-medroxyprogesterone (PREMPRO) 0.3-1.5 MG per tablet Take 1 tablet by mouth every morning.      . fenofibrate (TRICOR) 145 MG tablet Take 145 mg by mouth daily.      Marland Kitchen LORazepam (ATIVAN) 0.5 MG tablet Take 0.5 mg by mouth at bedtime.      . pantoprazole (PROTONIX) 40 MG tablet Take 40 mg by mouth daily.      . sucralfate (CARAFATE) 1 G tablet Take 1 g by mouth 4 (four) times daily.      . traZODone (DESYREL) 100 MG tablet Take 100 mg by mouth at bedtime.      . SUMAtriptan (IMITREX) 100 MG tablet Take 100 mg by mouth every 2 (two) hours as needed. migraine        Physical Exam: Blood pressure 150/98, pulse 127, temperature 98.7 F (37.1 C), temperature source Oral, resp. rate 19, height 5\' 2"  (1.575 m), weight 134 lb (60.782 kg), SpO2 95.00%.    Patient is comfortable in bed. Head is atraumatic. There is no jugular venous distention. There are no carotid bruits.  Lungs are clear. Respiratory effort is nonlabored. Cardiac exam reveals increased resting heart rate. There is an S1 and S2. There no clicks or significant murmurs. The abdomen is soft. There is no peripheral edema. There are no musculoskeletal deformities. There are no skin rashes.   Labs:   Lab Results  Component Value Date   WBC 7.3 12/01/2011   HGB 11.0* 12/01/2011   HCT 33.0* 12/01/2011   MCV 90.9 12/01/2011   PLT 156 12/01/2011    Lab 11/30/11 0422  NA 136  K 4.3  CL 102  CO2 28  BUN 9  CREATININE 0.66  CALCIUM 8.6  PROT --  BILITOT --  ALKPHOS --  ALT --  AST --  GLUCOSE 107*   No results found for this basename: CKTOTAL, CKMB, CKMBINDEX, TROPONINI    No results found for this basename: CHOL   No results found for this basename: HDL   No results found for this basename: LDLCALC   No results found for this basename: TRIG   No results found for this basename: CHOLHDL     No results found for this basename: LDLDIRECT      Radiology: No results found. EKG:  I have reviewed the EKG preadmission from November 21, 2011. I've also reviewed the EKG from November 29, 2011. The hospital. She has sinus tachycardia. There is decreased anterior R wave progression.  ASSESSMENT AND PLAN:    *OA (osteoarthritis) of knee   Patient is stable post surgery this admission   Postop Acute blood loss anemia   Patient has been transfused.   Sinus tachycardia     Etiology of sinus tachycardia is not clear at this point. There is no evidence that she's had a significant cardiac event.. Thyroid studies do not suggest hyperthyroidism. I wonder if there is any possibility that this could be related to variation in the psychotropic meds that she takes and any other meds that she may take at home. Two-dimensional echo will be done to assess LV function. I feel it would be optimal to slow her heart rate for better testing at this time. Therefore I have started a low dose of beta blocker.   Abnormal EKG   There is poor anterior R-wave progression on EKG. This was present on the preop EKG. Two-dimensional echo will help assess whether or not she in fact has a wall motion abnormality.  Orders have been written. We will follow the patient with you.   12/01/2011, 10:41 AM

## 2011-12-02 DIAGNOSIS — I059 Rheumatic mitral valve disease, unspecified: Secondary | ICD-10-CM

## 2011-12-02 MED ORDER — OXYCODONE HCL 5 MG PO TABS: 5.0000 mg | ORAL_TABLET | ORAL | Status: AC | PRN

## 2011-12-02 MED ORDER — RIVAROXABAN 10 MG PO TABS: 10.0000 mg | ORAL_TABLET | Freq: Every day | ORAL | Status: DC

## 2011-12-02 MED ORDER — POLYSACCHARIDE IRON COMPLEX 150 MG PO CAPS: 150.0000 mg | ORAL_CAPSULE | Freq: Every day | ORAL | Status: DC

## 2011-12-02 MED ORDER — METOPROLOL SUCCINATE ER 25 MG PO TB24: 25.0000 mg | ORAL_TABLET | Freq: Every day | ORAL | Status: DC

## 2011-12-02 MED ORDER — METHOCARBAMOL 500 MG PO TABS: 500.0000 mg | ORAL_TABLET | Freq: Four times a day (QID) | ORAL | Status: AC | PRN

## 2011-12-02 NOTE — Progress Notes (Signed)
Subjective: 4 Days Post-Op Procedure(s) (LRB): TOTAL KNEE ARTHROPLASTY (Left) Patient reports pain as mild.  Still with fast heart rate which has improved with the beta blocker. Patient is well, and has had no acute complaints or problems Plan is to go Home after hospital stay.  Objective: Vital signs in last 24 hours: Temp:  [98.2 F (36.8 C)-99 F (37.2 C)] 98.2 F (36.8 C) (04/26 1436) Pulse Rate:  [100-110] 100  (04/26 1436) Resp:  [16] 16  (04/26 1436) BP: (116-127)/(71-82) 127/82 mmHg (04/26 1436) SpO2:  [93 %-98 %] 96 % (04/26 1436)  Intake/Output from previous day:  Intake/Output Summary (Last 24 hours) at 12/02/11 1636 Last data filed at 12/02/11 1300  Gross per 24 hour  Intake    600 ml  Output    560 ml  Net     40 ml    Intake/Output this shift: Total I/O In: 600 [P.O.:600] Out: 200 [Urine:200]  Labs:  [facility] 12/01/11 0443 11/30/11 0422  HGB 11.0* 8.3*    Basename 12/01/11 0443 11/30/11 0422  WBC 7.3 7.4  RBC 3.63* 2.63*  HCT 33.0* 25.0*  PLT 156 143*    Basename 11/30/11 0422  NA 136  K 4.3  CL 102  CO2 28  BUN 9  CREATININE 0.66  GLUCOSE 107*  CALCIUM 8.6   No results found for this basename: LABPT:2,INR:2 in the last 72 hours  EXAM General - Patient is Alert, Appropriate and Oriented Extremity - Neurovascular intact Sensation intact distally Dressing/Incision - clean, dry, no drainage, healing Motor Function - intact, moving foot and toes well on exam.   Past Medical History  Diagnosis Date  . GERD (gastroesophageal reflux disease)   . Headache     hx of migraines   . Arthritis     knees,   . Anxiety     Assessment/Plan: 4 Days Post-Op Procedure(s) (LRB): TOTAL KNEE ARTHROPLASTY (Left) Principal Problem:  *OA (osteoarthritis) of knee Active Problems:  Postop Acute blood loss anemia  Sinus tachycardia  Abnormal EKG   Up with therapy Discharge home with home health when stable ECHO pending at the time seen patient  this morning.  Hopefully will get results and may be able to get home this weekend.  DVT Prophylaxis - Xarelto Weight-Bearing as tolerated to left leg  Joshalyn Ancheta 12/02/2011, 4:36 PM

## 2011-12-02 NOTE — Progress Notes (Signed)
Physical Therapy Treatment Patient Details Name: Kathleen Velazquez MRN: [id number] DOB: 1956-01-07 Today's Date: 12/02/2011 Time: 4098-1191 PT Time Calculation (min): 23 min  PT Assessment / Plan / Recommendation Comments on Treatment Session  OOB deferred pending cardiac testing scheduled for this am    Follow Up Recommendations  Home health PT    Equipment Recommendations  Rolling walker with 5" wheels    Frequency 7X/week   Plan Discharge plan remains appropriate;Frequency remains appropriate    Precautions / Restrictions Precautions Precautions: Knee Required Braces or Orthoses: Knee Immobilizer - Left Knee Immobilizer - Left: Discontinue once straight leg raise with < 10 degree lag Restrictions Weight Bearing Restrictions: No LLE Weight Bearing: Weight bearing as tolerated   Pertinent Vitals/Pain     Mobility  Bed Mobility Bed Mobility: Supine to Sit Supine to Sit: 5: Supervision Transfers Transfers: Sit to Stand;Stand to Sit Sit to Stand: 5: Supervision;With upper extremity assist;From chair/3-in-1 Stand to Sit: 5: Supervision;With upper extremity assist;To bed Details for Transfer Assistance: verbal cues for hand placement Ambulation/Gait Ambulation/Gait Assistance: 5: Supervision Ambulation Distance (Feet): 95 Feet Assistive device: Rolling walker Ambulation/Gait Assistance Details: min cues for posture and position from RW Gait Pattern: Step-to pattern Stairs: Yes Stairs Assistance: 4: Min guard Stair Management Technique: Two rails;Forwards;With walker;Backwards Number of Stairs: 2  (2 with Bil Rails; 2x2 bkwd with RW)    Exercises     PT Goals Acute Rehab PT Goals PT Goal Formulation: With patient Time For Goal Achievement: 12/06/11 Potential to Achieve Goals: Good Pt will go Supine/Side to Sit: with supervision PT Goal: Supine/Side to Sit - Progress: Met Pt will go Sit to Supine/Side: with supervision PT Goal: Sit to Supine/Side - Progress: Met Pt  will go Sit to Stand: with supervision PT Goal: Sit to Stand - Progress: Met Pt will go Stand to Sit: with supervision PT Goal: Stand to Sit - Progress: Met Pt will Ambulate: 51 - 150 feet;with supervision;with rolling walker PT Goal: Ambulate - Progress: Progressing toward goal Pt will Go Up / Down Stairs: 3-5 stairs;with min assist;with least restrictive assistive device PT Goal: Up/Down Stairs - Progress: Progressing toward goal  Visit Information  Last PT Received On: 12/02/11 Assistance Needed: +1    Subjective Data  Subjective: I might get to go home today Patient Stated Goal: Resume previous lifestyle with decreased pain   Cognition  Overall Cognitive Status: Appears within functional limits for tasks assessed/performed Arousal/Alertness: Awake/alert Orientation Level: Appears intact for tasks assessed Behavior During Session: [facility] for tasks performed    Balance     End of Session PT - End of Session Activity Tolerance: Patient tolerated treatment well Patient left: in chair;with call bell/phone within reach Nurse Communication: Mobility status    Julies Carmickle 12/02/2011, 1:39 PM

## 2011-12-02 NOTE — Progress Notes (Signed)
Physical Therapy Treatment Patient Details Name: Kathleen Velazquez MRN: [id number] DOB: 1955/08/30 Today's Date: 12/02/2011 Time: 4098-1191 PT Time Calculation (min): 25 min  PT Assessment / Plan / Recommendation Comments on Treatment Session  OOB deferred pending cardiac testing scheduled for this am    Follow Up Recommendations  Home health PT    Equipment Recommendations  Rolling walker with 5" wheels    Frequency 7X/week   Plan Discharge plan remains appropriate;Frequency remains appropriate    Precautions / Restrictions Precautions Precautions: Knee Required Braces or Orthoses: Knee Immobilizer - Left Knee Immobilizer - Left: Discontinue once straight leg raise with < 10 degree lag (Pt performed IND SLR this am) Restrictions LLE Weight Bearing: Weight bearing as tolerated   Pertinent Vitals/Pain     Mobility       Exercises Total Joint Exercises Ankle Circles/Pumps: AROM;Both;20 reps;Supine Quad Sets: AROM;Both;20 reps;Supine Short Arc Quad: AROM;AAROM;10 reps;5 reps;Both;Supine Heel Slides: AAROM;20 reps;Left;Supine Straight Leg Raises: AROM;AAROM;20 reps;Supine;Left   PT Goals Acute Rehab PT Goals PT Goal Formulation: With patient  Visit Information  Last PT Received On: 12/02/11 Assistance Needed: +1    Subjective Data  Subjective: I'm ready to get going Patient Stated Goal: Resume previous lifestyle with decreased pain   Cognition  Overall Cognitive Status: Appears within functional limits for tasks assessed/performed Arousal/Alertness: Awake/alert Orientation Level: Appears intact for tasks assessed Behavior During Session: [facility] for tasks performed    Balance     End of Session PT - End of Session Activity Tolerance: Patient tolerated treatment well Patient left: in bed;with call bell/phone within reach    [facility] 12/02/2011, 11:15 AM

## 2011-12-02 NOTE — Progress Notes (Signed)
Patient ID: Kathleen Velazquez, female   DOB: 16-Apr-1956, 56 y.o.   MRN: [id number]    Subjective:  Denies SSCP, palpitations or Dyspnea   Objective:  Filed Vitals:   12/01/11 1112 12/01/11 1422 12/01/11 2020 12/02/11 0448  BP:  150/95 116/71 118/78  Pulse: 129 126 101 110  Temp:  98 F (36.7 C) 98.8 F (37.1 C) 99 F (37.2 C)  TempSrc:  Oral Oral Oral  Resp: 18 16 16 16   Height:      Weight:      SpO2: 98% 97% 98% 93%    Intake/Output from previous day:  Intake/Output Summary (Last 24 hours) at 12/02/11 0754 Last data filed at 12/01/11 2257  Gross per 24 hour  Intake    480 ml  Output    660 ml  Net   -180 ml    Physical Exam: Affect appropriate Healthy:  appears stated age HEENT: normal Neck supple with no adenopathy JVP normal no bruits no thyromegaly Lungs clear with no wheezing and good diaphragmatic motion Heart:  S1/S2 no murmur, no rub, gallop or click PMI normal Abdomen: benighn, BS positve, no tenderness, no AAA no bruit.  No HSM or HJR Distal pulses intact with no bruits No edema Neuro non-focal Skin warm and dry No muscular weakness S/P TKR on left  Lab Results: Basic Metabolic Panel:  Basename 11/30/11 0422  NA 136  K 4.3  CL 102  CO2 28  GLUCOSE 107*  BUN 9  CREATININE 0.66  CALCIUM 8.6  MG --  PHOS --   Liver Function Tests: No results found for this basename: AST:2,ALT:2,ALKPHOS:2,BILITOT:2,PROT:2,ALBUMIN:2 in the last 72 hours No results found for this basename: LIPASE:2,AMYLASE:2 in the last 72 hours CBC:  Basename 12/01/11 0443 11/30/11 0422  WBC 7.3 7.4  NEUTROABS -- --  HGB 11.0* 8.3*  HCT 33.0* 25.0*  MCV 90.9 95.1  PLT 156 143*   Cardiac Enzymes: No results found for this basename: CKTOTAL:3,CKMB:3,CKMBINDEX:3,TROPONINI:3 in the last 72 hours BNP: No components found with this basename: POCBNP:3 D-Dimer: No results found for this basename: DDIMER:2 in the last 72 hours Hemoglobin A1C: No results found for this  basename: HGBA1C in the last 72 hours Fasting Lipid Panel: No results found for this basename: CHOL,HDL,LDLCALC,TRIG,CHOLHDL,LDLDIRECT in the last 72 hours Thyroid Function Tests:  Basename 11/30/11 1120  TSH 0.722  T4TOTAL --  T3FREE --  THYROIDAB --   Anemia Panel: No results found for this basename: VITAMINB12,FOLATE,FERRITIN,TIBC,IRON,RETICCTPCT in the last 72 hours  Imaging: No results found.  Cardiac Studies:  ECG:  Sinus tachycardia rate 131  Done 4/24 no acute ST/T wave changes   Telemetry:  SR sinus tachycardia no afib or arrhythmia  Echo: pending  Medications:     . dicyclomine  10 mg Oral QID  . docusate sodium  100 mg Oral BID  . fenofibrate  160 mg Oral Daily  . iron polysaccharides  150 mg Oral Daily  . LORazepam  0.5 mg Oral QHS  . metoprolol succinate  25 mg Oral Daily  . pantoprazole  40 mg Oral Q1200  . rivaroxaban  10 mg Oral Q breakfast  . sucralfate  1 g Oral QID  . traZODone  100 mg Oral QHS  . venlafaxine XR  75 mg Oral Q breakfast       . DISCONTD: dextrose 5 % and 0.9 % NaCl with KCl 20 mEq/L 100 mL/hr at 12/01/11 0500    Assessment/Plan:  Sinus Tachycardia:  Chronic Asymnptomatic  Started on Toprol 25 by JK.  Echo pending for today TKR:  Stable with good pain control  Follow Hct.  PT/OT On Xarelto for DVT prophylaxis  Charlton Haws 12/02/2011, 7:54 AM

## 2011-12-02 NOTE — Discharge Instructions (Signed)
Pick up stool softner and laxative for home. Do not submerge incision under water. May shower. Continue to use ice for pain and swelling from surgery.  Take Xarelto for two and a half more weeks, then discontinue Xarelto.  Patient is to go to outpatient therapy at [facility].  She has prescription to call for appointments.

## 2011-12-03 NOTE — Progress Notes (Signed)
   SUBJECTIVE:  No palpitations.  No SOB.  Wants to go home.     PHYSICAL EXAM Filed Vitals:   12/02/11 1436 12/02/11 1950 12/02/11 2148 12/03/11 0630  BP: 127/82  142/80 133/87  Pulse: 100 101 102 110  Temp: 98.2 F (36.8 C)  98.9 F (37.2 C) 98.9 F (37.2 C)  TempSrc: Oral  Oral Oral  Resp: 16  18 18   Height:      Weight:      SpO2: 96%  97% 98%   General:  No distress Lungs:  Clear Heart:  RRR Abdomen:  Positive bowel sounds, no rebound no guarding Extremities:  No edema except normal post op.   No results found for this or any previous visit (from the past 24 hour(s)).  Intake/Output Summary (Last 24 hours) at 12/03/11 0902 Last data filed at 12/03/11 0500  Gross per 24 hour  Intake    900 ml  Output    200 ml  Net    700 ml    ECHO:  EF OK without significant valvular abnormalities  ASSESSMENT AND PLAN:  1) OA (osteoarthritis) of knee:  Status post left total knee replacement  2) Sinus tachycardia:  TSH OK.  Echo OK.  We will follow up as an outpatient.       Kathleen Velazquez [facility] 12/03/2011 9:02 AM

## 2011-12-03 NOTE — Progress Notes (Signed)
Per MD, Pt to discharge home with oupt PT. Previous HH arrangements attempted by unit CM. Per pt request, desires to be seen as oupt. No DME needed. Family to assist in home care.   Kathleen Velazquez [phone]

## 2011-12-03 NOTE — Progress Notes (Addendum)
Orthopedics Progress Note  Subjective: S/p left total knee replacement doing well. Ready to go home  Objective:  Filed Vitals:   12/03/11 0630  BP: 133/87  Pulse: 110  Temp: 98.9 F (37.2 C)  Resp: 18    General: Awake and alert  Musculoskeletal: left knee nv intact distally, wound healing well Neurovascularly intact  Lab Results  Component Value Date   WBC 7.3 12/01/2011   HGB 11.0* 12/01/2011   HCT 33.0* 12/01/2011   MCV 90.9 12/01/2011   PLT 156 12/01/2011       Component Value Date/Time   NA 136 11/30/2011 0422   K 4.3 11/30/2011 0422   CL 102 11/30/2011 0422   CO2 28 11/30/2011 0422   GLUCOSE 107* 11/30/2011 0422   BUN 9 11/30/2011 0422   CREATININE 0.66 11/30/2011 0422   CALCIUM 8.6 11/30/2011 0422   GFRNONAA >90 11/30/2011 0422   GFRAA >90 11/30/2011 0422    Lab Results  Component Value Date   INR 0.96 11/21/2011    Assessment/Plan: POD #4  s/p Procedure(s):left  TOTAL KNEE ARTHROPLASTY  D/c home today F/u in 2 weeks  Almedia Balls. Ranell Patrick, MD 12/03/2011 6:55 AM    May go home once we get cardiology recommendations

## 2011-12-03 NOTE — Progress Notes (Signed)
Pt stable, scripts, and d/c instructions given and no questions/concerns voiced by patient or family.  Pt transported to private vehicle via wheelchair with NT and family.

## 2011-12-03 NOTE — Progress Notes (Signed)
Physical Therapy Treatment Patient Details Name: BRITNI DRISCOLL MRN: [id number] DOB: 1956-07-23 Today's Date: 12/03/2011 Time: 1015-1027 PT Time Calculation (min): 12 min  PT Assessment / Plan / Recommendation Comments on Treatment Session  OOB deferred due to pt ready to leave and waiting on discharge instructions. Educated pt on ther ex for home and to perform them 3x/day. Pt to set up outpatient PT on Monday.     Follow Up Recommendations  Home health PT;Outpatient PT (recommended HHPT, not available, going outpt.)    Equipment Recommendations  Rolling walker with 5" wheels    Frequency 7X/week   Plan Discharge plan remains appropriate;Frequency remains appropriate    Precautions / Restrictions Precautions Precautions: Knee Required Braces or Orthoses: Knee Immobilizer - Left Knee Immobilizer - Left: Discontinue once straight leg raise with < 10 degree lag (discontinued today with SLR's) Restrictions LLE Weight Bearing: Weight bearing as tolerated        Mobility  Bed Mobility Supine to Sit: 6: Modified independent (Device/Increase time);HOB elevated Sit to Supine: 6: Modified independent (Device/Increase time);HOB elevated Details for Bed Mobility Assistance: HOB about 30 degrees and no rails used. Pt. uses her right leg to lift her left leg off/on the bed.    Exercises Total Joint Exercises Ankle Circles/Pumps: AROM;Both;10 reps;Supine Quad Sets: AROM;Left;Strengthening;10 reps;Supine (5 second hold) Short Arc Quad: AROM;Strengthening;Left;10 reps;Supine Heel Slides: AAROM;Strengthening;Left;10 reps;Supine Straight Leg Raises: AROM;Strengthening;Left;10 reps;Supine   PT Goals Acute Rehab PT Goals PT Goal: Supine/Side to Sit - Progress: Met PT Goal: Sit to Supine/Side - Progress: Met  Visit Information  Last PT Received On: 12/03/11 Assistance Needed: +1    Subjective Data  Subjective: "I am ready to go home today"   Cognition  Overall Cognitive Status:  Appears within functional limits for tasks assessed/performed Arousal/Alertness: Awake/alert Orientation Level: Appears intact for tasks assessed Behavior During Session: [facility] for tasks performed    Balance  Balance Balance Assessed: No  End of Session PT - End of Session Activity Tolerance: Patient tolerated treatment well Patient left: in bed;with call bell/phone within reach;with family/visitor present Nurse Communication: Mobility status CPM Left Knee CPM Left Knee: Off    Pearlena, Ow 12/03/2011, 11:00 AM  Sallyanne Kuster, PTA Office- [phone] Pager- [phone]

## 2011-12-04 NOTE — Discharge Summary (Signed)
Physician Discharge Summary  Patient ID: Kathleen Velazquez MRN: [id number] DOB/AGE: 09-Aug-1955 56 y.o.  Admit date: 11/28/2011 Discharge date: 12/04/2011  Admission Diagnoses:  Principal Problem:  *OA (osteoarthritis) of knee Active Problems:  Postop Acute blood loss anemia  Sinus tachycardia  Abnormal EKG   Discharge Diagnoses:  Same   Surgeries: Procedure(s): TOTAL KNEE ARTHROPLASTY on 11/28/2011   Consultants: PT/OT  Discharged Condition: Stable  Hospital Course: Kathleen Velazquez is an 56 y.o. female who was admitted 11/28/2011 with a chief complaint of No chief complaint on file. , and found to have a diagnosis of OA (osteoarthritis) of knee.  They were brought to the operating room on 11/28/2011 and underwent the above named procedures.    The patient had an uncomplicated hospital course and was stable for discharge.  Recent vital signs:  Filed Vitals:   12/03/11 0630  BP: 133/87  Pulse: 110  Temp: 98.9 F (37.2 C)  Resp: 18    Recent laboratory studies:  Results for orders placed during the hospital encounter of 11/28/11  TYPE AND SCREEN      Component Value Range   ABO/RH(D) O POS     Antibody Screen NEG     Sample Expiration 12/01/2011     Unit Number [id number]     Blood Component Type RED CELLS,LR     Unit division 00     Status of Unit ISSUED,FINAL     Transfusion Status OK TO TRANSFUSE     Crossmatch Result Compatible     Unit Number [id number]     Blood Component Type RED CELLS,LR     Unit division 00     Status of Unit ISSUED,FINAL     Transfusion Status OK TO TRANSFUSE     Crossmatch Result Compatible    ABO/RH      Component Value Range   ABO/RH(D) O POS    CBC      Component Value Range   WBC 8.3  4.0 - 10.5 (K/uL)   RBC 2.95 (*) 3.87 - 5.11 (MIL/uL)   Hemoglobin 9.2 (*) 12.0 - 15.0 (g/dL)   HCT 93.8 (*) 18.2 - 46.0 (%)   MCV 94.6  78.0 - 100.0 (fL)   MCH 31.2  26.0 - 34.0 (pg)   MCHC 33.0  30.0 - 36.0 (g/dL)   RDW 99.3  71.6 - 96.7 (%)   Platelets 156  150 - 400 (K/uL)  BASIC METABOLIC PANEL      Component Value Range   Sodium 136  135 - 145 (mEq/L)   Potassium 4.1  3.5 - 5.1 (mEq/L)   Chloride 103  96 - 112 (mEq/L)   CO2 27  19 - 32 (mEq/L)   Glucose, Bld 137 (*) 70 - 99 (mg/dL)   BUN 12  6 - 23 (mg/dL)   Creatinine, Ser 8.93  0.50 - 1.10 (mg/dL)   Calcium 8.3 (*) 8.4 - 10.5 (mg/dL)   GFR calc non Af Amer >90  >90 (mL/min)   GFR calc Af Amer >90  >90 (mL/min)  CBC      Component Value Range   WBC 7.4  4.0 - 10.5 (K/uL)   RBC 2.63 (*) 3.87 - 5.11 (MIL/uL)   Hemoglobin 8.3 (*) 12.0 - 15.0 (g/dL)   HCT 81.0 (*) 17.5 - 46.0 (%)   MCV 95.1  78.0 - 100.0 (fL)   MCH 31.6  26.0 - 34.0 (pg)   MCHC 33.2  30.0 - 36.0 (g/dL)   RDW 12.5  11.5 - 15.5 (%)   Platelets 143 (*) 150 - 400 (K/uL)  BASIC METABOLIC PANEL      Component Value Range   Sodium 136  135 - 145 (mEq/L)   Potassium 4.3  3.5 - 5.1 (mEq/L)   Chloride 102  96 - 112 (mEq/L)   CO2 28  19 - 32 (mEq/L)   Glucose, Bld 107 (*) 70 - 99 (mg/dL)   BUN 9  6 - 23 (mg/dL)   Creatinine, Ser 4.09  0.50 - 1.10 (mg/dL)   Calcium 8.6  8.4 - 81.1 (mg/dL)   GFR calc non Af Amer >90  >90 (mL/min)   GFR calc Af Amer >90  >90 (mL/min)  TSH      Component Value Range   TSH 0.722  0.350 - 4.500 (uIU/mL)  PREPARE RBC (CROSSMATCH)      Component Value Range   Order Confirmation ORDER PROCESSED BY BLOOD BANK    CBC      Component Value Range   WBC 7.3  4.0 - 10.5 (K/uL)   RBC 3.63 (*) 3.87 - 5.11 (MIL/uL)   Hemoglobin 11.0 (*) 12.0 - 15.0 (g/dL)   HCT 91.4 (*) 78.2 - 46.0 (%)   MCV 90.9  78.0 - 100.0 (fL)   MCH 30.3  26.0 - 34.0 (pg)   MCHC 33.3  30.0 - 36.0 (g/dL)   RDW 95.6  21.3 - 08.6 (%)   Platelets 156  150 - 400 (K/uL)    Discharge Medications:   Medication List  As of 12/04/2011  8:59 AM   STOP taking these medications         estrogen (conjugated)-medroxyprogesterone 0.3-1.5 MG per tablet         TAKE these medications         desvenlafaxine 50 MG 24  hr tablet   Commonly known as: PRISTIQ   Take 50 mg by mouth every morning.      dicyclomine 10 MG capsule   Commonly known as: BENTYL   Take 10 mg by mouth 4 (four) times daily.      fenofibrate 145 MG tablet   Commonly known as: TRICOR   Take 145 mg by mouth daily.      iron polysaccharides 150 MG capsule   Commonly known as: NIFEREX   Take 1 capsule (150 mg total) by mouth daily. Take for three weeks and then discontinue.      LORazepam 0.5 MG tablet   Commonly known as: ATIVAN   Take 0.5 mg by mouth at bedtime.      methocarbamol 500 MG tablet   Commonly known as: ROBAXIN   Take 1 tablet (500 mg total) by mouth every 6 (six) hours as needed.      metoprolol succinate 25 MG 24 hr tablet   Commonly known as: TOPROL-XL   Take 1 tablet (25 mg total) by mouth daily.      oxyCODONE 5 MG immediate release tablet   Commonly known as: Oxy IR/ROXICODONE   Take 1-2 tablets (5-10 mg total) by mouth every 3 (three) hours as needed.      pantoprazole 40 MG tablet   Commonly known as: PROTONIX   Take 40 mg by mouth daily.      rivaroxaban 10 MG Tabs tablet   Commonly known as: XARELTO   Take 1 tablet (10 mg total) by mouth daily with breakfast.      sucralfate 1 G tablet   Commonly known as: CARAFATE  Take 1 g by mouth 4 (four) times daily.      SUMAtriptan 100 MG tablet   Commonly known as: IMITREX   Take 100 mg by mouth every 2 (two) hours as needed. migraine      traZODone 100 MG tablet   Commonly known as: DESYREL   Take 100 mg by mouth at bedtime.            Diagnostic Studies:  Chest 2 View  11/21/2011  *RADIOLOGY REPORT*  Clinical Data: Preoperative evaluation.  CHEST - 2 VIEW  Comparison: None.  Findings: The cardiac silhouette is normal size and shape.  No mediastinal or hilar lesions are evident.  No pulmonary infiltrates or nodules were evident. No pleural abnormality is evident. Bones appear average for age.  Cholecystectomy clips are present.  IMPRESSION:  No acute or active cardiopulmonary or pleural abnormalities are evident.  Original Report Authenticated By: Crawford Givens, M.D.    Disposition: 01-Home or Self Care  Discharge Orders    Future Orders Please Complete By Expires   Diet - low sodium heart healthy      Diet Carb Modified      Diet - low sodium heart healthy      Call MD / Call 911      Comments:   If you experience chest pain or shortness of breath, CALL 911 and be transported to the hospital emergency room.  If you develope a fever above 101 F, pus (white drainage) or increased drainage or redness at the wound, or calf pain, call your surgeon's office.   Discharge instructions      Comments:   Pick up stool softner and laxative for home. Do not submerge incision under water. May shower. Continue to use ice for pain and swelling from surgery.   Take Xarelto for two and a half more weeks, then discontinue Xarelto.  Patient will go straight to outpatient therapy after going home.  She has been provided the prescription for [employer] and she is to call for appointments.   Constipation Prevention      Comments:   Drink plenty of fluids.  Prune juice may be helpful.  You may use a stool softener, such as Colace (over the counter) 100 mg twice a day.  Use MiraLax (over the counter) for constipation as needed.   Increase activity slowly as tolerated      Weight Bearing as taught in Physical Therapy      Comments:   Use a walker or crutches as instructed.   Patient may shower      Comments:   You may shower without a dressing once there is no drainage.  Do not wash over the wound.  If drainage remains, do not shower until drainage stops.   Driving restrictions      Comments:   No driving until released by the physician.   Lifting restrictions      Comments:   No lifting until released by the physician.   TED hose      Comments:   Use stockings (TED hose) for 3 weeks on both leg(s).  You may remove them at night for  sleeping.   Change dressing      Comments:   Change dressing daily with sterile 4 x 4 inch gauze dressing and apply TED hose. Do not submerge the incision under water.   Do not put a pillow under the knee. Place it under the heel.      Do not  sit on low chairs, stoools or toilet seats, as it may be difficult to get up from low surfaces      Call MD / Call 911      Comments:   If you experience chest pain or shortness of breath, CALL 911 and be transported to the hospital emergency room.  If you develope a fever above 101 F, pus (white drainage) or increased drainage or redness at the wound, or calf pain, call your surgeon's office.   Constipation Prevention      Comments:   Drink plenty of fluids.  Prune juice may be helpful.  You may use a stool softener, such as Colace (over the counter) 100 mg twice a day.  Use MiraLax (over the counter) for constipation as needed.   Increase activity slowly as tolerated      Weight Bearing as taught in Physical Therapy      Comments:   Use a walker or crutches as instructed.   Do not put a pillow under the knee. Place it under the heel.         Follow-up Information    Follow up with Loanne Drilling, MD. Schedule an appointment as soon as possible for a visit in 2 weeks.   Contact information:   [facility] [address] [place] [address] [phone]           Signed: Thea Gist 12/04/2011, 8:59 AM

## 2011-12-05 ENCOUNTER — Encounter (HOSPITAL_COMMUNITY): Payer: Self-pay | Admitting: Orthopedic Surgery

## 2011-12-21 ENCOUNTER — Ambulatory Visit: Payer: No Typology Code available for payment source | Admitting: Physician Assistant

## 2011-12-28 ENCOUNTER — Telehealth: Payer: Self-pay | Admitting: Cardiology

## 2011-12-28 NOTE — Telephone Encounter (Signed)
HEARTRATE  120 GETTING OUT OF BED... PATIENT STATED THAT IT HAS BEEN RUNNING HIGH LAST SEVERAL DAYS

## 2011-12-28 NOTE — Telephone Encounter (Signed)
Recent hospitalization records reviewed. Pt seen in consultation by JK for persistent sinus tachycardia. 2D echo NL. DC'd on Toprol XL 25 daily. We can increase to 25 bid, and reassess at f/u OV.

## 2011-12-28 NOTE — Telephone Encounter (Signed)
Heart rate staying elevated since April 22 (knee replacement).  Been ranging 100 to 120.  States other than elevated heart rate, no other symptoms.  Feels fine.  OV with Gene scheduled for 6/10.

## 2011-12-30 MED ORDER — METOPROLOL SUCCINATE ER 25 MG PO TB24: 25.0000 mg | ORAL_TABLET | Freq: Two times a day (BID) | ORAL | Status: DC

## 2011-12-30 NOTE — Telephone Encounter (Signed)
Patient informed and uses [facility].

## 2012-01-16 ENCOUNTER — Encounter: Payer: Self-pay | Admitting: Physician Assistant

## 2012-01-16 ENCOUNTER — Ambulatory Visit (INDEPENDENT_AMBULATORY_CARE_PROVIDER_SITE_OTHER): Payer: No Typology Code available for payment source | Admitting: Physician Assistant

## 2012-01-16 VITALS — BP 131/81 | HR 98 | Ht 62.0 in | Wt 128.0 lb

## 2012-01-16 DIAGNOSIS — R000 Tachycardia, unspecified: Secondary | ICD-10-CM

## 2012-01-16 DIAGNOSIS — R002 Palpitations: Secondary | ICD-10-CM

## 2012-01-16 NOTE — Progress Notes (Signed)
HPI: Patient presents in post hospital followup from [facility], following recent evaluation for persistent ST. She was seen by Dr. Myrtis Ser. He noted no definite cardiac etiology. Thyroid studies within normal limits. She was placed on low-dose Toprol. A 2-D echo was within normal limits. She was status post left TKR on April 22.  Patient presents with no complaint of palpitations. She denies any exertional CP or SOB. She is slowly recovering from her recent left TKR, and is still experiencing residual discomfort.  Following her hospitalization, patient contacted our office with complaint of persistently elevated heart rate of approximately 120 bpm. Given this, I increased Toprol to 25 mg twice a day.  EKG today, reviewed by me, indicates NSR at 90 bpm with NSST changes  No Known Allergies  Current Outpatient Prescriptions  Medication Sig Dispense Refill  . cephALEXin (KEFLEX) 250 MG capsule Take 250 mg by mouth 3 (three) times daily.       Marland Kitchen desvenlafaxine (PRISTIQ) 50 MG 24 hr tablet Take 50 mg by mouth every morning.      . dicyclomine (BENTYL) 10 MG capsule Take 10 mg by mouth 4 (four) times daily.      . fenofibrate (TRICOR) 145 MG tablet Take 145 mg by mouth daily.      Marland Kitchen LORazepam (ATIVAN) 0.5 MG tablet Take 0.5 mg by mouth at bedtime.      . methocarbamol (ROBAXIN) 500 MG tablet Take 500 mg by mouth 3 (three) times daily.       . metoprolol succinate (TOPROL-XL) 25 MG 24 hr tablet Take 1 tablet (25 mg total) by mouth 2 (two) times daily.  60 tablet  1  . oxyCODONE-acetaminophen (PERCOCET) 5-325 MG per tablet Take 1 tablet by mouth every 8 (eight) hours as needed.       . sucralfate (CARAFATE) 1 G tablet Take 1 g by mouth 4 (four) times daily.      . SUMAtriptan (IMITREX) 100 MG tablet Take 100 mg by mouth every 2 (two) hours as needed. migraine      . traZODone (DESYREL) 100 MG tablet Take 100 mg by mouth at bedtime.        Past Medical History  Diagnosis Date  . GERD (gastroesophageal  reflux disease)   . Headache     hx of migraines   . Arthritis     knees,   . Anxiety     Past Surgical History  Procedure Date  . Cholecystectomy   . Tubal ligation   . Joint replacement     right knee replacement   . Other surgical history     kleft breast biopsy - benign   . Other surgical history     hx of skin cancer surgery   . Total knee arthroplasty 11/28/2011    Procedure: TOTAL KNEE ARTHROPLASTY;  Surgeon: Loanne Drilling, MD;  Location: [facility] ORS;  Service: Orthopedics;  Laterality: Left;    History   Social History  . Marital Status: Married    Spouse Name: N/A    Number of Children: N/A  . Years of Education: N/A   Occupational History  . Not on file.   Social History Main Topics  . Smoking status: Never Smoker   . Smokeless tobacco: Never Used  . Alcohol Use: No     recovering alcoholic x 6 years   . Drug Use: No  . Sexually Active:    Other Topics Concern  . Not on file   Social History Narrative  .  No narrative on file    No family history on file.  ROS: no nausea, vomiting; no fever, chills; no melena, hematochezia; no claudication  PHYSICAL EXAM: BP 131/81  Pulse 98  Ht 5\' 2"  (1.575 m)  Wt 128 lb (58.06 kg)  BMI 23.41 kg/m2 GENERAL: 56 year old female, sitting upright; NAD HEENT: NCAT, PERRLA, EOMI; sclera clear; no xanthelasma NECK: palpable bilateral carotid pulses, no bruits; no JVD; no TM LUNGS: CTA bilaterally CARDIAC: RRR (S1, S2); no significant murmurs; no rubs or gallops ABDOMEN: soft, non-tender; intact BS EXTREMETIES: intact distal pulses; no significant peripheral edema SKIN: warm/dry; no obvious rash/lesions MUSCULOSKELETAL: Well-healed left knee incision; minimal erythema, mild edema NEURO: no focal deficit; NL affect   EKG: reviewed and available in Electronic Records   ASSESSMENT & PLAN:  Sinus tachycardia Maintaining NSR on current divided dose of Toprol. Patient presents with no complaints of tachycardia  palpitations. We'll continue current dose, and reassess clinical status in 3 months, with Dr. Willa Rough. Patient may eventually come off beta blocker, once her basal heart rate is significantly lowered. No further cardiac workup is indicated; patient has no known CAD, and had a recent normal 2-D echocardiogram.  Palpitations Patient had a negative event monitor in 2007, reviewed by Dr. Andee Lineman. A 2-D echo was also done at that time, and within normal limits.     Gene Malerie Eakins, PAC

## 2012-01-16 NOTE — Patient Instructions (Signed)
Continue all current medications. Follow up in  3 months 

## 2012-01-16 NOTE — Assessment & Plan Note (Signed)
Maintaining NSR on current divided dose of Toprol. Patient presents with no complaints of tachycardia palpitations. We'll continue current dose, and reassess clinical status in 3 months, with Dr. Willa Rough. Patient may eventually come off beta blocker, once her basal heart rate is significantly lowered. No further cardiac workup is indicated; patient has no known CAD, and had a recent normal 2-D echocardiogram.

## 2012-01-16 NOTE — Assessment & Plan Note (Signed)
Patient had a negative event monitor in 2007, reviewed by Dr. Andee Lineman. A 2-D echo was also done at that time, and within normal limits.

## 2012-04-10 ENCOUNTER — Ambulatory Visit: Payer: No Typology Code available for payment source | Admitting: Cardiology

## 2012-04-26 ENCOUNTER — Encounter: Payer: Self-pay | Admitting: Cardiology

## 2012-04-26 DIAGNOSIS — R519 Headache, unspecified: Secondary | ICD-10-CM | POA: Insufficient documentation

## 2012-04-26 DIAGNOSIS — R51 Headache: Secondary | ICD-10-CM | POA: Insufficient documentation

## 2012-04-26 DIAGNOSIS — IMO0002 Reserved for concepts with insufficient information to code with codable children: Secondary | ICD-10-CM | POA: Insufficient documentation

## 2012-04-26 DIAGNOSIS — M1990 Unspecified osteoarthritis, unspecified site: Secondary | ICD-10-CM | POA: Insufficient documentation

## 2012-04-26 DIAGNOSIS — R9430 Abnormal result of cardiovascular function study, unspecified: Secondary | ICD-10-CM | POA: Insufficient documentation

## 2012-04-26 DIAGNOSIS — F419 Anxiety disorder, unspecified: Secondary | ICD-10-CM | POA: Insufficient documentation

## 2012-04-26 DIAGNOSIS — K219 Gastro-esophageal reflux disease without esophagitis: Secondary | ICD-10-CM | POA: Insufficient documentation

## 2012-04-26 DIAGNOSIS — R000 Tachycardia, unspecified: Secondary | ICD-10-CM | POA: Insufficient documentation

## 2012-04-27 ENCOUNTER — Ambulatory Visit (INDEPENDENT_AMBULATORY_CARE_PROVIDER_SITE_OTHER): Payer: No Typology Code available for payment source | Admitting: Cardiology

## 2012-04-27 ENCOUNTER — Encounter: Payer: Self-pay | Admitting: Cardiology

## 2012-04-27 VITALS — BP 137/88 | HR 82 | Ht 62.0 in | Wt 128.4 lb

## 2012-04-27 DIAGNOSIS — I498 Other specified cardiac arrhythmias: Secondary | ICD-10-CM

## 2012-04-27 DIAGNOSIS — R000 Tachycardia, unspecified: Secondary | ICD-10-CM

## 2012-04-27 NOTE — Progress Notes (Signed)
Patient ID: Kathleen Velazquez, female   DOB: July 14, 1956, 56 y.o.   MRN: [id number]   HPI  Patient is seen today to followup sinus tachycardia. The patient has mild persistent sinus tachycardia. There is no definite etiology. She has normal left ventricular function. She has been kept on low-dose beta blocker. This is quite effective and she feels good.  She mentions some tingling in her arms with her hands in certain positions. This does not sound vascular or ischemic in origin.  No Known Allergies  Current Outpatient Prescriptions  Medication Sig Dispense Refill  . aspirin 81 MG tablet Take 81 mg by mouth daily.      Marland Kitchen desvenlafaxine (PRISTIQ) 50 MG 24 hr tablet Take 50 mg by mouth every morning.      Marland Kitchen LORazepam (ATIVAN) 0.5 MG tablet Take 0.5 mg by mouth at bedtime.      . metoprolol succinate (TOPROL-XL) 25 MG 24 hr tablet Take 1 tablet (25 mg total) by mouth 2 (two) times daily.  60 tablet  1  . traZODone (DESYREL) 100 MG tablet Take 100 mg by mouth at bedtime.        History   Social History  . Marital Status: Married    Spouse Name: N/A    Number of Children: N/A  . Years of Education: N/A   Occupational History  . Not on file.   Social History Main Topics  . Smoking status: Never Smoker   . Smokeless tobacco: Never Used  . Alcohol Use: No     recovering alcoholic x 6 years   . Drug Use: No  . Sexually Active:    Other Topics Concern  . Not on file   Social History Narrative  . No narrative on file    No family history on file.  Past Medical History  Diagnosis Date  . GERD (gastroesophageal reflux disease)   . Headache     hx of migraines   . Arthritis     knees,   . Anxiety   . Ejection fraction     Normal, echo, June, 2013  . Sinus tachycardia     Persistent sinus tachycardia, June, 2013 ( prior monitor in 2007 had shown no significant abnormalities.)    Past Surgical History  Procedure Date  . Cholecystectomy   . Tubal ligation   . Joint  replacement     right knee replacement   . Other surgical history     kleft breast biopsy - benign   . Other surgical history     hx of skin cancer surgery   . Total knee arthroplasty 11/28/2011    Procedure: TOTAL KNEE ARTHROPLASTY;  Surgeon: Loanne Drilling, MD;  Location: [facility];  Service: Orthopedics;  Laterality: Left;    ROS   Patient denies fever, chills, headache, sweats, rash, change in vision, change in hearing, chest pain, cough, nausea vomiting, urinary symptoms. All other systems are reviewed and are negative.  PHYSICAL EXAM  Patient is oriented to person time and place. Affect is normal. Lungs are clear. Respiratory effort is nonlabored. Cardiac exam reveals S1 and S2. There are no clicks or significant murmurs. The abdomen is soft. There is no peripheral edema.  Filed Vitals:   04/27/12 1031  BP: 137/88  Pulse: 82  Height: 5\' 2"  (1.575 m)  Weight: 128 lb 6.4 oz (58.242 kg)  SpO2: 100%     ASSESSMENT & PLAN

## 2012-04-27 NOTE — Patient Instructions (Addendum)

## 2012-04-27 NOTE — Assessment & Plan Note (Signed)
The patient has mild sinus tachycardia at times. Low-dose beta-blockade helps her significantly. This is to be continued. I spoke with her at length about the fact that I feel that her cardiac status is quite stable. I will see her back in 6 months.

## 2013-05-16 ENCOUNTER — Encounter (HOSPITAL_COMMUNITY)
Admission: RE | Admit: 2013-05-16 | Discharge: 2013-05-16 | Disposition: A | Discharge status: Home / Self Care | Payer: No Typology Code available for payment source | Source: Ambulatory Visit | Attending: General Surgery | Admitting: General Surgery

## 2013-05-16 ENCOUNTER — Encounter (HOSPITAL_COMMUNITY): Payer: Self-pay | Admitting: Pharmacy Technician

## 2013-05-16 ENCOUNTER — Other Ambulatory Visit: Payer: Self-pay

## 2013-05-16 ENCOUNTER — Encounter (HOSPITAL_COMMUNITY): Payer: Self-pay

## 2013-05-16 DIAGNOSIS — Z01810 Encounter for preprocedural cardiovascular examination: Secondary | ICD-10-CM | POA: Insufficient documentation

## 2013-05-16 DIAGNOSIS — Z01818 Encounter for other preprocedural examination: Secondary | ICD-10-CM | POA: Insufficient documentation

## 2013-05-16 DIAGNOSIS — Z01812 Encounter for preprocedural laboratory examination: Secondary | ICD-10-CM | POA: Insufficient documentation

## 2013-05-16 HISTORY — DX: Irritable bowel syndrome, unspecified: K58.9

## 2013-05-16 HISTORY — DX: Nausea with vomiting, unspecified: R11.2

## 2013-05-16 HISTORY — DX: Other specified postprocedural states: Z98.890

## 2013-05-16 LAB — CBC WITH DIFFERENTIAL/PLATELET
Basophils Relative: 1 % (ref 0–1)
Eosinophils Absolute: 0 10*3/uL (ref 0.0–0.7)
Eosinophils Relative: 0 % (ref 0–5)
Hemoglobin: 8.6 g/dL — ABNORMAL LOW (ref 12.0–15.0)
Lymphs Abs: 1.7 10*3/uL (ref 0.7–4.0)
MCH: 25.1 pg — ABNORMAL LOW (ref 26.0–34.0)
MCHC: 31 g/dL (ref 30.0–36.0)
MCV: 80.8 fL (ref 78.0–100.0)
Monocytes Relative: 9 % (ref 3–12)
Platelets: 232 10*3/uL (ref 150–400)
RBC: 3.43 MIL/uL — ABNORMAL LOW (ref 3.87–5.11)

## 2013-05-16 LAB — BASIC METABOLIC PANEL
BUN: 14 mg/dL (ref 6–23)
CO2: 24 mEq/L (ref 19–32)
Calcium: 9.4 mg/dL (ref 8.4–10.5)
GFR calc non Af Amer: >90 mL/min (ref >90)
Glucose, Bld: 92 mg/dL (ref 70–99)
Sodium: 139 mEq/L (ref 135–145)

## 2013-05-16 NOTE — Patient Instructions (Addendum)
Kathleen Velazquez  05/16/2013   Your procedure is scheduled on:  05/20/2013  Report to [facility] at  615  AM.  Call this number if you have problems the morning of surgery: [phone]   Remember:   Do not eat food or drink liquids after midnight.   Take these medicines the morning of surgery with A SIP OF WATER:  pristiq, metoprolol   Do not wear jewelry, make-up or nail polish.  Do not wear lotions, powders, or perfumes.   Do not shave 48 hours prior to surgery. Men may shave face and neck.  Do not bring valuables to the hospital.  [facility] is not responsible  for any belongings or valuables.               Contacts, dentures or bridgework may not be worn into surgery.  Leave suitcase in the car. After surgery it may be brought to your room.  For patients admitted to the hospital, discharge time is determined by your treatment team.               Patients discharged the day of surgery will not be allowed to drive home.  Name and phone number of your driver: family  Special Instructions: Shower using CHG 2 nights before surgery and the night before surgery.  If you shower the day of surgery use CHG.  Use special wash - you have one bottle of CHG for all showers.  You should use approximately 1/3 of the bottle for each shower.   Please read over the following fact sheets that you were given: Pain Booklet, Coughing and Deep Breathing, Surgical Site Infection Prevention, Anesthesia Post-op Instructions and Care and Recovery After Surgery Hemorrhoidectomy Hemorrhoidectomy is surgery to remove hemorrhoids. Hemorrhoids are veins that have become swollen in the rectum. The rectum is the area from the bottom end of the intestines to the opening where bowel movements leave the body. Hemorrhoids can be uncomfortable. They can cause itching, bleeding and pain if a blood clot forms in them (thrombose). If hemorrhoids are small, surgery may not be needed. But if they cover a larger area,  surgery is usually suggested.  LET YOUR CAREGIVER KNOW ABOUT:   Any allergies.  All medications you are taking, including:  Herbs, eyedrops, over-the-counter medications and creams.  Blood thinners (anticoagulants), aspirin or other drugs that could affect blood clotting.  Use of steroids (by mouth or as creams).  Previous problems with anesthetics, including local anesthetics.  Possibility of pregnancy, if this applies.  Any history of blood clots.  Any history of bleeding or other blood problems.  Previous surgery.  Smoking history.  Other health problems. RISKS AND COMPLICATIONS All surgery carries some risk. However, hemorrhoid surgery usually goes smoothly. Possible complications could include:  Urinary retention.  Bleeding.  Infection.  A painful incision.  A reaction to the anesthesia (this is not common). BEFORE THE PROCEDURE   Stop using aspirin and non-steroidal anti-inflammatory drugs (NSAIDs) for pain relief. This includes prescription drugs and over-the-counter drugs such as ibuprofen and naproxen. Also stop taking vitamin E. If possible, do this two weeks before your surgery.  If you take blood-thinners, ask your healthcare provider when you should stop taking them.  You will probably have blood and urine tests done several days before your surgery.  Do not eat or drink for about 8 hours before the surgery.  Arrive at least an hour before the surgery, or whenever your surgeon  recommends. This will give you time to check in and fill out any needed paperwork.  Hemorrhoidectomy is often an outpatient procedure. This means you will be able to go home the same day. Sometimes, though, people stay overnight in the hospital after the procedure. Ask your surgeon what to expect. Either way, make arrangements in advance for someone to drive you home. PROCEDURE   The preparation:  You will change into a hospital gown.  You will be given an IV. A needle will  be inserted in your arm. Medication can flow directly into your body through this needle.  You might be given an enema to clear your rectum.  Once in the operating room, you will probably lie on your side or be repositioned later to lying on your stomach.  You will be given anesthesia (medication) so you will not feel anything during the surgery. The surgery often is done with local anesthesia (the area near the hemorrhoids will be numb and you will be drowsy but awake). Sometimes, general anesthesia is used (you will be asleep during the procedure).  The procedure:  There are a few different procedures for hemorrhoids. Be sure to ask you surgeon about the procedure, the risks and benefits.  Be sure to ask about what you need to do to take care of the wound, if there is one. AFTER THE PROCEDURE  You will stay in a recovery area until the anesthesia has worn off. Your blood pressure and pulse will be checked every so often.  You may feel a lot of pain in the area of the rectum.  Take all pain medication prescribed by your surgeon. Ask before taking any over-the-counter pain medicines.  Sometimes sitting in a warm bath can help relieve your pain.  To make sure you have bowel movements without straining:  You will probably need to take stool softeners (usually a pill) for a few days.  You should drink 8 to 10 glasses of water each day.  Your activity will be restricted for awhile. Ask your caregiver for a list of what you should and should not do while you recover. Document Released: 05/22/2009 Document Revised: 10/17/2011 Document Reviewed: 05/22/2009 [facility] Patient Information 2014 [place], [place]. PATIENT INSTRUCTIONS POST-ANESTHESIA  IMMEDIATELY FOLLOWING SURGERY:  Do not drive or operate machinery for the first twenty four hours after surgery.  Do not make any important decisions for twenty four hours after surgery or while taking narcotic pain medications or sedatives.  If you  develop intractable nausea and vomiting or a severe headache please notify your doctor immediately.  FOLLOW-UP:  Please make an appointment with your surgeon as instructed. You do not need to follow up with anesthesia unless specifically instructed to do so.  WOUND CARE INSTRUCTIONS (if applicable):  Keep a dry clean dressing on the anesthesia/puncture wound site if there is drainage.  Once the wound has quit draining you may leave it open to air.  Generally you should leave the bandage intact for twenty four hours unless there is drainage.  If the epidural site drains for more than 36-48 hours please call the anesthesia department.  QUESTIONS?:  Please feel free to call your physician or the hospital operator if you have any questions, and they will be happy to assist you.

## 2013-05-16 NOTE — Pre-Procedure Instructions (Signed)
Patient in for PAT. EKG shows Sinus Tachy. Patient states she has this difficulty sometimes and that she has not taken her Metoprolol today. Dr Lovell Sheehan and Dr Jayme Cloud notified and both state to stress to patient that she needs to stay on this med regularly  and take it the am of surgery. Patient verbalized understanding of this.

## 2013-05-16 NOTE — H&P (Signed)
  NTS SOAP Note  Vital Signs:  Vitals as of: 05/16/2013: Systolic 169: Diastolic 101: Heart Rate 139: Temp 99.20F: Height 69ft 2in: Weight 131Lbs 0 Ounces: Pain Level 8: BMI 23.96  BMI : 23.96 kg/m2  Subjective: This 13 Years 86 Months old Female presents for of    HEMORRHOIDS: ,Has had hemorrhoidal disease for many years, but recently the pain has worsened.  Makes it hard to sit.  Occassional bleeding, noted.  Last had a TCS 9 years ago.  No family h/o colon carcinoma.  Review of Symptoms:  Constitutional:  fatigue Head:unremarkable    Eyes:unremarkable   Nose/Mouth/Throat:unremarkable Cardiovascular:  unremarkable   Respiratory:unremarkable   Gastrointestinal:  unremarkable   Genitourinary:unremarkable     Musculoskeletal:unremarkable   Skin:unremarkable Hematolgic/Lymphatic:unremarkable     Allergic/Immunologic:unremarkable     Past Medical History:    Reviewed   Past Medical History  Surgical History: BTL, cholecystectomy, bilateral knee replacements, left breast biopsy Medical Problems: HTN, anxiety, IBS Allergies: nkda Medications: lorazepam, trazodone, sumatriptan, pristiq, metoprolol, dicyclomine, sucralfate   Social History:Reviewed  Social History  Preferred Language: English Race:  White Ethnicity: Not Hispanic / Latino Age: 73 Years 8 Months Marital Status:  M Alcohol: none in seven years Recreational drug(s):  No   Smoking Status: Never smoker reviewed on 05/16/2013 Functional Status reviewed on mm/dd/yyyy ------------------------------------------------ Bathing: Normal Cooking: Normal Dressing: Normal Driving: Normal Eating: Normal Managing Meds: Normal Oral Care: Normal Shopping: Normal Toileting: Normal Transferring: Normal Walking: Normal Cognitive Status reviewed on mm/dd/yyyy ------------------------------------------------ Attention: Normal Decision Making: Normal Language: Normal Memory:  Normal Motor: Normal Perception: Normal Problem Solving: Normal Visual and Spatial: Normal   Family History:  Reviewed  Family Health History Mother, Living; Heart attack (myocardial infarction);  Father, Deceased; Heart attack (myocardial infarction);     Objective Information: General:  Well appearing, well nourished in no distress. Heart:  RRR, no murmur, slightly tachycardic Lungs:    CTA bilaterally, no wheezes, rhonchi, rales.  Breathing unlabored. Abdomen:Soft, NT/ND, no HSM, no masses.   Extensive internal and external hemorrhoidal disease, exam limited secondary to pain  Assessment:Hemorrhoidal disease  Diagnosis &amp; Procedure Smart Code   Plan:Scheduled for extensive hemorrhoidectomy on 05/20/13.   Patient Education:Alternative treatments to surgery were discussed with patient (and family).  Risks and benefits  of procedure including bleeding, infection, and recurrence of the hemorrhoids were fully explained to the patient (and family) who gave informed consent. Patient/family questions were addressed.  Follow-up:Pending Surgery

## 2013-05-17 NOTE — Pre-Procedure Instructions (Signed)
H & H called to Dr Lovell Sheehan and shown to Dr Jayme Cloud. Will repeat Istat on arrival.

## 2013-05-20 ENCOUNTER — Encounter (HOSPITAL_COMMUNITY): Payer: Self-pay | Admitting: *Deleted

## 2013-05-20 ENCOUNTER — Ambulatory Visit (HOSPITAL_COMMUNITY): Payer: No Typology Code available for payment source | Admitting: Anesthesiology

## 2013-05-20 ENCOUNTER — Ambulatory Visit (HOSPITAL_COMMUNITY)
Admission: RE | Admit: 2013-05-20 | Discharge: 2013-05-20 | Disposition: A | Discharge status: Home / Self Care | Payer: No Typology Code available for payment source | Source: Ambulatory Visit | Attending: General Surgery | Admitting: General Surgery

## 2013-05-20 ENCOUNTER — Encounter (HOSPITAL_COMMUNITY)
Admission: RE | Disposition: A | Discharge status: Home / Self Care | Payer: Self-pay | Source: Ambulatory Visit | Attending: General Surgery

## 2013-05-20 ENCOUNTER — Encounter (HOSPITAL_COMMUNITY): Payer: No Typology Code available for payment source | Admitting: Anesthesiology

## 2013-05-20 ENCOUNTER — Ambulatory Visit (HOSPITAL_COMMUNITY): Payer: No Typology Code available for payment source

## 2013-05-20 DIAGNOSIS — I10 Essential (primary) hypertension: Secondary | ICD-10-CM | POA: Insufficient documentation

## 2013-05-20 DIAGNOSIS — R0602 Shortness of breath: Secondary | ICD-10-CM | POA: Insufficient documentation

## 2013-05-20 DIAGNOSIS — K648 Other hemorrhoids: Secondary | ICD-10-CM | POA: Insufficient documentation

## 2013-05-20 DIAGNOSIS — K644 Residual hemorrhoidal skin tags: Secondary | ICD-10-CM | POA: Insufficient documentation

## 2013-05-20 DIAGNOSIS — Z01812 Encounter for preprocedural laboratory examination: Secondary | ICD-10-CM | POA: Insufficient documentation

## 2013-05-20 HISTORY — PX: HEMORRHOID SURGERY: SHX153

## 2013-05-20 LAB — POCT I-STAT 4, (NA,K, GLUC, HGB,HCT): Hemoglobin: 9.9 g/dL — ABNORMAL LOW (ref 12.0–15.0)

## 2013-05-20 SURGERY — HEMORRHOIDECTOMY
Anesthesia: General | Site: Rectum | Wound class: Dirty or Infected

## 2013-05-20 MED ORDER — ALBUTEROL SULFATE (5 MG/ML) 0.5% IN NEBU
2.5000 mg | INHALATION_SOLUTION | Freq: Four times a day (QID) | RESPIRATORY_TRACT | Status: DC | PRN
  Administered 2013-05-20: 2.5 mg via RESPIRATORY_TRACT

## 2013-05-20 MED ORDER — BUPIVACAINE HCL (PF) 0.5 % IJ SOLN
INTRAMUSCULAR | Status: DC | PRN
  Administered 2013-05-20: 9 mg

## 2013-05-20 MED ORDER — SODIUM CHLORIDE 0.9 % IR SOLN
Status: DC | PRN
  Administered 2013-05-20: 1000 mL

## 2013-05-20 MED ORDER — ALBUTEROL SULFATE (5 MG/ML) 0.5% IN NEBU
INHALATION_SOLUTION | RESPIRATORY_TRACT | Status: AC
  Filled 2013-05-20: qty 0.5

## 2013-05-20 MED ORDER — PROPOFOL 10 MG/ML IV BOLUS
INTRAVENOUS | Status: DC | PRN
  Administered 2013-05-20: 140 mg via INTRAVENOUS

## 2013-05-20 MED ORDER — FENTANYL CITRATE 0.05 MG/ML IJ SOLN
INTRAMUSCULAR | Status: AC
  Filled 2013-05-20: qty 2

## 2013-05-20 MED ORDER — LIDOCAINE HCL (PF) 1 % IJ SOLN
INTRAMUSCULAR | Status: AC
  Filled 2013-05-20: qty 5

## 2013-05-20 MED ORDER — OXYCODONE-ACETAMINOPHEN 7.5-325 MG PO TABS: 1.0000 | ORAL_TABLET | ORAL | Status: DC | PRN

## 2013-05-20 MED ORDER — FENTANYL CITRATE 0.05 MG/ML IJ SOLN
INTRAMUSCULAR | Status: DC | PRN
  Administered 2013-05-20 (×2): 50 ug via INTRAVENOUS
  Administered 2013-05-20: 100 ug via INTRAVENOUS
  Administered 2013-05-20: 50 ug via INTRAVENOUS

## 2013-05-20 MED ORDER — SODIUM CHLORIDE 0.9 % IN NEBU
INHALATION_SOLUTION | RESPIRATORY_TRACT | Status: AC
  Filled 2013-05-20: qty 3

## 2013-05-20 MED ORDER — KETOROLAC TROMETHAMINE 30 MG/ML IJ SOLN
30.0000 mg | Freq: Once | INTRAMUSCULAR | Status: AC
  Administered 2013-05-20: 30 mg via INTRAVENOUS
  Filled 2013-05-20: qty 1

## 2013-05-20 MED ORDER — MIDAZOLAM HCL 2 MG/2ML IJ SOLN
INTRAMUSCULAR | Status: AC
  Filled 2013-05-20: qty 2

## 2013-05-20 MED ORDER — DEXAMETHASONE SODIUM PHOSPHATE 4 MG/ML IJ SOLN
4.0000 mg | Freq: Once | INTRAMUSCULAR | Status: AC
  Administered 2013-05-20: 4 mg via INTRAVENOUS
  Filled 2013-05-20: qty 1

## 2013-05-20 MED ORDER — LIDOCAINE HCL (CARDIAC) 20 MG/ML IV SOLN
INTRAVENOUS | Status: DC | PRN
  Administered 2013-05-20: 50 mg via INTRAVENOUS

## 2013-05-20 MED ORDER — PROPOFOL 10 MG/ML IV BOLUS
INTRAVENOUS | Status: AC
  Filled 2013-05-20: qty 20

## 2013-05-20 MED ORDER — CHLORHEXIDINE GLUCONATE 4 % EX LIQD: 1.0000 "application " | Freq: Once | CUTANEOUS | Status: DC

## 2013-05-20 MED ORDER — LACTATED RINGERS IV SOLN
INTRAVENOUS | Status: DC
  Administered 2013-05-20: 07:00:00 via INTRAVENOUS

## 2013-05-20 MED ORDER — ARTIFICIAL TEARS OP OINT
TOPICAL_OINTMENT | OPHTHALMIC | Status: AC
  Filled 2013-05-20: qty 3.5

## 2013-05-20 MED ORDER — BUPIVACAINE HCL (PF) 0.5 % IJ SOLN
INTRAMUSCULAR | Status: AC
  Filled 2013-05-20: qty 30

## 2013-05-20 MED ORDER — LIDOCAINE VISCOUS 2 % MT SOLN
OROMUCOSAL | Status: DC | PRN
  Administered 2013-05-20: 20 mL

## 2013-05-20 MED ORDER — SUCCINYLCHOLINE CHLORIDE 20 MG/ML IJ SOLN
INTRAMUSCULAR | Status: AC
  Filled 2013-05-20: qty 1

## 2013-05-20 MED ORDER — LACTATED RINGERS IV SOLN
INTRAVENOUS | Status: DC | PRN
  Administered 2013-05-20 (×2): via INTRAVENOUS

## 2013-05-20 MED ORDER — LIDOCAINE VISCOUS 2 % MT SOLN
OROMUCOSAL | Status: AC
  Filled 2013-05-20: qty 15

## 2013-05-20 MED ORDER — SODIUM CHLORIDE 0.9 % IV SOLN
1.0000 g | Freq: Once | INTRAVENOUS | Status: AC
  Administered 2013-05-20: 1 g via INTRAVENOUS
  Filled 2013-05-20: qty 1

## 2013-05-20 MED ORDER — FENTANYL CITRATE 0.05 MG/ML IJ SOLN
INTRAMUSCULAR | Status: AC
  Filled 2013-05-20: qty 5

## 2013-05-20 MED ORDER — FENTANYL CITRATE 0.05 MG/ML IJ SOLN
25.0000 ug | INTRAMUSCULAR | Status: DC | PRN
  Administered 2013-05-20 (×3): 25 ug via INTRAVENOUS

## 2013-05-20 MED ORDER — SUCCINYLCHOLINE CHLORIDE 20 MG/ML IJ SOLN
INTRAMUSCULAR | Status: DC | PRN
  Administered 2013-05-20: 100 mg via INTRAVENOUS

## 2013-05-20 MED ORDER — ONDANSETRON HCL 4 MG/2ML IJ SOLN: 4.0000 mg | Freq: Once | INTRAMUSCULAR | Status: DC | PRN

## 2013-05-20 MED ORDER — MIDAZOLAM HCL 2 MG/2ML IJ SOLN
1.0000 mg | INTRAMUSCULAR | Status: DC | PRN
  Administered 2013-05-20: 2 mg via INTRAVENOUS

## 2013-05-20 MED ORDER — ONDANSETRON HCL 4 MG/2ML IJ SOLN
4.0000 mg | Freq: Once | INTRAMUSCULAR | Status: AC
  Administered 2013-05-20: 4 mg via INTRAVENOUS
  Filled 2013-05-20: qty 2

## 2013-05-20 SURGICAL SUPPLY — 30 items
BAG HAMPER (MISCELLANEOUS) ×2 IMPLANT
CLOTH BEACON ORANGE TIMEOUT ST (SAFETY) ×2 IMPLANT
COVER LIGHT HANDLE STERIS (MISCELLANEOUS) ×4 IMPLANT
COVER MAYO STAND XLG (DRAPE) ×2 IMPLANT
DECANTER SPIKE VIAL GLASS SM (MISCELLANEOUS) ×2 IMPLANT
DRAPE PROXIMA HALF (DRAPES) ×2 IMPLANT
ELECT REM PT RETURN 9FT ADLT (ELECTROSURGICAL) ×2
ELECTRODE REM PT RTRN 9FT ADLT (ELECTROSURGICAL) ×1 IMPLANT
FORMALIN 10 PREFIL 120ML (MISCELLANEOUS) ×2 IMPLANT
GLOVE BIO SURGEON STRL SZ7.5 (GLOVE) ×2 IMPLANT
GLOVE BIOGEL PI IND STRL 7.0 (GLOVE) ×1 IMPLANT
GLOVE BIOGEL PI IND STRL 7.5 (GLOVE) ×2 IMPLANT
GLOVE BIOGEL PI INDICATOR 7.0 (GLOVE) ×1
GLOVE BIOGEL PI INDICATOR 7.5 (GLOVE) ×2
GLOVE ECLIPSE 7.0 STRL STRAW (GLOVE) ×2 IMPLANT
GOWN STRL REIN XL XLG (GOWN DISPOSABLE) ×4 IMPLANT
HEMOSTAT SURGICEL 4X8 (HEMOSTASIS) ×2 IMPLANT
KIT ROOM TURNOVER AP CYSTO (KITS) ×2 IMPLANT
LIGASURE IMPACT 36 18CM CVD LR (INSTRUMENTS) ×2 IMPLANT
MANIFOLD NEPTUNE II (INSTRUMENTS) ×2 IMPLANT
NEEDLE HYPO 25X1 1.5 SAFETY (NEEDLE) ×2 IMPLANT
NS IRRIG 1000ML POUR BTL (IV SOLUTION) ×2 IMPLANT
PACK PERI GYN (CUSTOM PROCEDURE TRAY) ×2 IMPLANT
PAD ARMBOARD 7.5X6 YLW CONV (MISCELLANEOUS) ×2 IMPLANT
SET BASIN LINEN APH (SET/KITS/TRAYS/PACK) ×2 IMPLANT
SPONGE GAUZE 4X4 12PLY (GAUZE/BANDAGES/DRESSINGS) ×2 IMPLANT
SURGILUBE 3G PEEL PACK STRL (MISCELLANEOUS) ×2 IMPLANT
SUT SILK 0 FSL (SUTURE) ×2 IMPLANT
SUT VIC AB 2-0 CT2 27 (SUTURE) ×2 IMPLANT
SYR CONTROL 10ML LL (SYRINGE) ×2 IMPLANT

## 2013-05-20 NOTE — Anesthesia Preprocedure Evaluation (Signed)
Anesthesia Evaluation  Patient identified by MRN, date of birth, ID band Patient awake    Reviewed: Allergy & Precautions, H&P , NPO status , Patient's Chart, lab work & pertinent test results, reviewed documented beta blocker date and time   History of Anesthesia Complications (+) PONV and history of anesthetic complications  Airway Mallampati: II TM Distance: >3 FB Neck ROM: Full    Dental no notable dental hx. (+) Teeth Intact   Pulmonary neg pulmonary ROS, asthma ,  breath sounds clear to auscultation  Pulmonary exam normal       Cardiovascular negative cardio ROS  + dysrhythmias (beta blocker for persistant tachycardia.) Rhythm:Regular Rate:Normal     Neuro/Psych  Headaches, negative neurological ROS  negative psych ROS   GI/Hepatic negative GI ROS, Neg liver ROS, GERD-  Medicated,  Endo/Other  negative endocrine ROS  Renal/GU negative Renal ROS  negative genitourinary   Musculoskeletal negative musculoskeletal ROS (+)   Abdominal   Peds negative pediatric ROS (+)  Hematology negative hematology ROS (+)   Anesthesia Other Findings   Reproductive/Obstetrics negative OB ROS                           Anesthesia Physical Anesthesia Plan  ASA: II  Anesthesia Plan: General   Post-op Pain Management:    Induction: Intravenous, Rapid sequence and Cricoid pressure planned  Airway Management Planned: Oral ETT  Additional Equipment:   Intra-op Plan:   Post-operative Plan: Extubation in OR  Informed Consent: I have reviewed the patients History and Physical, chart, labs and discussed the procedure including the risks, benefits and alternatives for the proposed anesthesia with the patient or authorized representative who has indicated his/her understanding and acceptance.     Plan Discussed with:   Anesthesia Plan Comments:         Anesthesia Quick Evaluation

## 2013-05-20 NOTE — Progress Notes (Signed)
Patient had some hypoxia while on room air. Chest x-ray was performed which revealed no acute changes. The patient states that she does not smoke and did not have fevers or chills prior to surgery. She states she occasionally does get short of breath when walking up stairs. The patient was transferred to the day surgery area and she has been in the 90-95% range on room air. She is cleared for discharge. She was instructed to followup in the emergency room or with her primary care physician should her shortness of breath or dyspnea worsened. I will see her in 1 week for followup.

## 2013-05-20 NOTE — Op Note (Signed)
Patient:  Kathleen Velazquez  DOB:  15-Jan-1956  MRN:  [id number]   Preop Diagnosis:  Hemorrhoidal disease  Postop Diagnosis:  Same  Procedure:  Extensive hemorrhoidectomy  Surgeon:  Franky Macho, M.D.  Anes:  General  Indications:  Patient is a 57 year old white female presents with both internal and external hemorrhoidal disease which is painful and causes rectal bleeding. The risks and benefits of the procedure including bleeding, infection, and recurrence of hemorrhoidal disease were fully explained to the patient, who gave informed consent.  Procedure note:  The patient was placed in the lithotomy position after general anesthesia was administered. The perineum was prepped and draped using usual sterile technique with Betadine. Surgical site confirmation was performed.  The patient had extensive external hemorrhoidal skin tags anteriorly and external hemorrhoids along with internal hemorrhoids at the 4:00 and 7:00 position. The internal and external hemorrhoids at the 4 and 7:00 positions were excised in continuity using the LigaSure. Several 2-0 Vicryl sutures were placed in order to approximate the skin at the 4:00 position. No abnormal bleeding was noted the end of the procedure. The internal sphincter was noted to be intact. 0.5% Sensorcaine was instilled the surrounding wound. Surgicel and Viscous Xylocaine rectal packing was then placed.  All tape and needle counts were correct at the end of the procedure. Patient was awakened and transferred to PACU in stable condition.  Complications:  None  EBL:  Minimal  Specimen:  Hemorrhoids

## 2013-05-20 NOTE — Preoperative (Signed)
Beta Blockers   Reason not to administer Beta Blockers:Not Applicable 

## 2013-05-20 NOTE — Progress Notes (Signed)
1315 Spoke to Dr. Lovell Sheehan about patient's oxygen levels. No shortness of breath noted. Informed her that if shortness of breath occurs to see her family doctor or go to ER. Encouraged to use incentive spirometer. Dr. Lovell Sheehan informed her to sleep with upper body elevated with two or three pillows.

## 2013-05-20 NOTE — Anesthesia Postprocedure Evaluation (Signed)
  Anesthesia Post-op Note  Patient: Kathleen Velazquez  Procedure(s) Performed: Procedure(s): EXTENSIVE HEMORRHOIDECTOMY (N/A)  Patient Location: PACU  Anesthesia Type:General  Level of Consciousness: awake, oriented and patient cooperative  Airway and Oxygen Therapy: Patient Spontanous Breathing and Patient connected to face mask oxygen  Post-op Pain: mild  Post-op Assessment: Post-op Vital signs reviewed, Patient's Cardiovascular Status Stable, Respiratory Function Stable, Patent Airway, No signs of Nausea or vomiting and Pain level controlled  Post-op Vital Signs: Reviewed and stable  Complications: No apparent anesthesia complications

## 2013-05-20 NOTE — Transfer of Care (Signed)
Immediate Anesthesia Transfer of Care Note  Patient: Kathleen Velazquez  Procedure(s) Performed: Procedure(s): EXTENSIVE HEMORRHOIDECTOMY (N/A)  Patient Location: PACU  Anesthesia Type:General  Level of Consciousness: awake, alert , oriented and patient cooperative  Airway & Oxygen Therapy: Patient Spontanous Breathing and Patient connected to face mask oxygen  Post-op Assessment: Report given to PACU RN and Post -op Vital signs reviewed and stable  Post vital signs: Reviewed and stable  Complications: No apparent anesthesia complications

## 2013-05-20 NOTE — Interval H&P Note (Signed)
History and Physical Interval Note:  05/20/2013 7:25 AM  Kathleen Velazquez  has presented today for surgery, with the diagnosis of hemorrhoids  The various methods of treatment have been discussed with the patient and family. After consideration of risks, benefits and other options for treatment, the patient has consented to  Procedure(s): EXTENSIVE HEMORRHOIDECTOMY (N/A) as a surgical intervention .  The patient's history has been reviewed, patient examined, no change in status, stable for surgery.  I have reviewed the patient's chart and labs.  Questions were answered to the patient's satisfaction.     Franky Macho A

## 2013-05-20 NOTE — Anesthesia Procedure Notes (Addendum)
Anesthesia Procedure Note  Procedure Name: Intubation Date/Time: 05/20/2013 7:43 AM Performed by: Carolyne Littles, AMY L Pre-anesthesia Checklist: Patient identified, Timeout performed, Emergency Drugs available, Suction available and Patient being monitored Patient Re-evaluated:Patient Re-evaluated prior to inductionOxygen Delivery Method: Circle System Utilized Preoxygenation: Pre-oxygenation with 100% oxygen Intubation Type: IV induction, Rapid sequence and Cricoid Pressure applied Laryngoscope Size: 3 and Miller Grade View: Grade I Tube type: Oral Tube size: 7.0 mm Number of attempts: 1 Airway Equipment and Method: stylet Placement Confirmation: ETT inserted through vocal cords under direct vision,  positive ETCO2 and breath sounds checked- equal and bilateral Secured at: 21 cm Tube secured with: Tape Dental Injury: Teeth and Oropharynx as per pre-operative assessment

## 2013-05-21 ENCOUNTER — Encounter (HOSPITAL_COMMUNITY): Payer: Self-pay | Admitting: General Surgery

## 2013-07-01 ENCOUNTER — Encounter: Payer: Self-pay | Admitting: Cardiology

## 2016-07-27 ENCOUNTER — Emergency Department (HOSPITAL_COMMUNITY): Payer: No Typology Code available for payment source

## 2016-07-27 ENCOUNTER — Inpatient Hospital Stay (HOSPITAL_COMMUNITY)
Admission: EM | Admit: 2016-07-27 | Discharge: 2016-08-01 | DRG: 640 | Disposition: A | Discharge status: Home / Self Care | Payer: No Typology Code available for payment source | Attending: Internal Medicine | Admitting: Internal Medicine

## 2016-07-27 ENCOUNTER — Encounter (HOSPITAL_COMMUNITY): Payer: Self-pay | Admitting: Emergency Medicine

## 2016-07-27 DIAGNOSIS — E875 Hyperkalemia: Secondary | ICD-10-CM | POA: Diagnosis not present

## 2016-07-27 DIAGNOSIS — I471 Supraventricular tachycardia, unspecified: Secondary | ICD-10-CM

## 2016-07-27 DIAGNOSIS — R0902 Hypoxemia: Secondary | ICD-10-CM

## 2016-07-27 DIAGNOSIS — I5033 Acute on chronic diastolic (congestive) heart failure: Secondary | ICD-10-CM | POA: Diagnosis not present

## 2016-07-27 DIAGNOSIS — E860 Dehydration: Secondary | ICD-10-CM | POA: Diagnosis present

## 2016-07-27 DIAGNOSIS — I110 Hypertensive heart disease with heart failure: Secondary | ICD-10-CM | POA: Diagnosis present

## 2016-07-27 DIAGNOSIS — Z79899 Other long term (current) drug therapy: Secondary | ICD-10-CM

## 2016-07-27 DIAGNOSIS — R339 Retention of urine, unspecified: Secondary | ICD-10-CM | POA: Diagnosis not present

## 2016-07-27 DIAGNOSIS — J9601 Acute respiratory failure with hypoxia: Secondary | ICD-10-CM | POA: Diagnosis not present

## 2016-07-27 DIAGNOSIS — R531 Weakness: Secondary | ICD-10-CM | POA: Diagnosis not present

## 2016-07-27 DIAGNOSIS — I509 Heart failure, unspecified: Secondary | ICD-10-CM | POA: Diagnosis not present

## 2016-07-27 DIAGNOSIS — K580 Irritable bowel syndrome with diarrhea: Secondary | ICD-10-CM | POA: Diagnosis present

## 2016-07-27 DIAGNOSIS — R262 Difficulty in walking, not elsewhere classified: Secondary | ICD-10-CM | POA: Diagnosis present

## 2016-07-27 DIAGNOSIS — M6281 Muscle weakness (generalized): Secondary | ICD-10-CM

## 2016-07-27 DIAGNOSIS — K219 Gastro-esophageal reflux disease without esophagitis: Secondary | ICD-10-CM | POA: Diagnosis present

## 2016-07-27 DIAGNOSIS — R197 Diarrhea, unspecified: Secondary | ICD-10-CM

## 2016-07-27 DIAGNOSIS — Z85828 Personal history of other malignant neoplasm of skin: Secondary | ICD-10-CM | POA: Diagnosis not present

## 2016-07-27 DIAGNOSIS — Z96653 Presence of artificial knee joint, bilateral: Secondary | ICD-10-CM | POA: Diagnosis present

## 2016-07-27 DIAGNOSIS — R0602 Shortness of breath: Secondary | ICD-10-CM

## 2016-07-27 DIAGNOSIS — K589 Irritable bowel syndrome without diarrhea: Secondary | ICD-10-CM

## 2016-07-27 DIAGNOSIS — E876 Hypokalemia: Principal | ICD-10-CM | POA: Diagnosis present

## 2016-07-27 DIAGNOSIS — K529 Noninfective gastroenteritis and colitis, unspecified: Secondary | ICD-10-CM

## 2016-07-27 LAB — URINALYSIS, MICROSCOPIC (REFLEX): RBC / HPF: NONE SEEN RBC/hpf (ref 0–5)

## 2016-07-27 LAB — CBC WITH DIFFERENTIAL/PLATELET
Basophils Absolute: 0 10*3/uL (ref 0.0–0.1)
Basophils Relative: 0 %
Eosinophils Absolute: 0 10*3/uL (ref 0.0–0.7)
Eosinophils Relative: 0 %
HCT: 38.3 % (ref 36.0–46.0)
HEMOGLOBIN: 13.3 g/dL (ref 12.0–15.0)
LYMPHS ABS: 2 10*3/uL (ref 0.7–4.0)
Lymphocytes Relative: 23 %
MCH: 34.5 pg — ABNORMAL HIGH (ref 26.0–34.0)
MCHC: 34.7 g/dL (ref 30.0–36.0)
MCV: 99.2 fL (ref 78.0–100.0)
Monocytes Absolute: 0.7 10*3/uL (ref 0.1–1.0)
Monocytes Relative: 8 %
NEUTROS PCT: 69 %
Neutro Abs: 5.8 10*3/uL (ref 1.7–7.7)
PLATELETS: 181 10*3/uL (ref 150–400)
RBC: 3.86 MIL/uL — AB (ref 3.87–5.11)
RDW: 13.9 % (ref 11.5–15.5)
WBC: 8.6 10*3/uL (ref 4.0–10.5)

## 2016-07-27 LAB — URINALYSIS, ROUTINE W REFLEX MICROSCOPIC
GLUCOSE, UA: NEGATIVE mg/dL
HGB URINE DIPSTICK: NEGATIVE
Ketones, ur: NEGATIVE mg/dL
NITRITE: NEGATIVE
PH: 6.5 (ref 5.0–8.0)
SPECIFIC GRAVITY, URINE: 1.01 (ref 1.005–1.030)

## 2016-07-27 LAB — COMPREHENSIVE METABOLIC PANEL
ALBUMIN: 3.3 g/dL — AB (ref 3.5–5.0)
ALK PHOS: 82 U/L (ref 38–126)
ALT: 27 U/L (ref 14–54)
AST: 34 U/L (ref 15–41)
Anion gap: 10 (ref 5–15)
BUN: 8 mg/dL (ref 6–20)
CALCIUM: 8.8 mg/dL — AB (ref 8.9–10.3)
CO2: 33 mmol/L — AB (ref 22–32)
CREATININE: 0.6 mg/dL (ref 0.44–1.00)
Chloride: 100 mmol/L — ABNORMAL LOW (ref 101–111)
GFR calc Af Amer: >60 mL/min (ref >60)
GFR calc non Af Amer: >60 mL/min (ref >60)
Glucose, Bld: 115 mg/dL — ABNORMAL HIGH (ref 65–99)
Potassium: <2 mmol/L — CL (ref 3.5–5.1)
Sodium: 143 mmol/L (ref 135–145)
Total Bilirubin: 1.3 mg/dL — ABNORMAL HIGH (ref 0.3–1.2)
Total Protein: 5.9 g/dL — ABNORMAL LOW (ref 6.5–8.1)

## 2016-07-27 LAB — MRSA PCR SCREENING: MRSA by PCR: NEGATIVE

## 2016-07-27 LAB — MAGNESIUM: MAGNESIUM: 1.3 mg/dL — AB (ref 1.7–2.4)

## 2016-07-27 MED ORDER — SODIUM CHLORIDE 0.9 % IV BOLUS (SEPSIS)
1000.0000 mL | Freq: Once | INTRAVENOUS | Status: AC
  Administered 2016-07-27: 1000 mL via INTRAVENOUS

## 2016-07-27 MED ORDER — SODIUM CHLORIDE 0.9 % IV SOLN
INTRAVENOUS | Status: DC
  Administered 2016-07-27 – 2016-07-28 (×2): via INTRAVENOUS

## 2016-07-27 MED ORDER — ENSURE ENLIVE PO LIQD
237.0000 mL | Freq: Two times a day (BID) | ORAL | Status: DC
  Administered 2016-07-29 – 2016-08-01 (×5): 237 mL via ORAL

## 2016-07-27 MED ORDER — MAGNESIUM SULFATE IN D5W 1-5 GM/100ML-% IV SOLN
1.0000 g | Freq: Once | INTRAVENOUS | Status: AC
  Administered 2016-07-27: 1 g via INTRAVENOUS
  Filled 2016-07-27: qty 100

## 2016-07-27 MED ORDER — INFLUENZA VAC SPLIT QUAD 0.5 ML IM SUSY
0.5000 mL | PREFILLED_SYRINGE | INTRAMUSCULAR | Status: AC
  Administered 2016-07-29: 0.5 mL via INTRAMUSCULAR
  Filled 2016-07-27: qty 0.5

## 2016-07-27 MED ORDER — POTASSIUM CHLORIDE CRYS ER 20 MEQ PO TBCR
40.0000 meq | EXTENDED_RELEASE_TABLET | ORAL | Status: DC
  Administered 2016-07-27 – 2016-07-28 (×4): 40 meq via ORAL
  Filled 2016-07-27 (×4): qty 2

## 2016-07-27 MED ORDER — METOPROLOL SUCCINATE ER 25 MG PO TB24
25.0000 mg | ORAL_TABLET | Freq: Two times a day (BID) | ORAL | Status: DC
  Administered 2016-07-27 – 2016-07-29 (×4): 25 mg via ORAL
  Filled 2016-07-27 (×4): qty 1

## 2016-07-27 MED ORDER — VENLAFAXINE HCL ER 75 MG PO CP24
75.0000 mg | ORAL_CAPSULE | Freq: Every day | ORAL | Status: DC
  Administered 2016-07-28 – 2016-08-01 (×5): 75 mg via ORAL
  Filled 2016-07-27 (×5): qty 1

## 2016-07-27 MED ORDER — SODIUM CHLORIDE 0.9% FLUSH
3.0000 mL | Freq: Two times a day (BID) | INTRAVENOUS | Status: DC
  Administered 2016-07-27 – 2016-08-01 (×8): 3 mL via INTRAVENOUS

## 2016-07-27 MED ORDER — POTASSIUM CHLORIDE 10 MEQ/100ML IV SOLN
10.0000 meq | INTRAVENOUS | Status: AC
Start: 2016-07-27 — End: 2016-07-27
  Administered 2016-07-27 (×4): 10 meq via INTRAVENOUS
  Filled 2016-07-27 (×4): qty 100

## 2016-07-27 MED ORDER — MAGNESIUM SULFATE 2 GM/50ML IV SOLN
2.0000 g | Freq: Once | INTRAVENOUS | Status: AC
  Administered 2016-07-27: 2 g via INTRAVENOUS
  Filled 2016-07-27: qty 50

## 2016-07-27 NOTE — ED Triage Notes (Signed)
Per EMS pt has hx of IBS and hemmrhoids.  Severe symptoms of IBS and legs not working.  Pt had 4 bad bouts of diarrhea last night and 4 more this morning that was brown liquid.  Never been tested for Cdiff or H. Pylori.  Pt denies having any pain.  She states that she has no strength and cannot walk, this has been going on since yesterday.  CBG 133 18G LT AC

## 2016-07-27 NOTE — ED Notes (Signed)
Pt could not lift her legs off the bed, but could flex and extend her toes.

## 2016-07-27 NOTE — ED Notes (Signed)
ED Provider at bedside. 

## 2016-07-27 NOTE — ED Provider Notes (Signed)
Medical screening examination/treatment/procedure(s) were conducted as a shared visit with non-physician practitioner(s) and myself.  I personally evaluated the patient during the encounter.   EKG Interpretation  Date/Time:  Wednesday July 27 2016 11:23:46 EST Ventricular Rate:  192 PR Interval:    QRS Duration: 72 QT Interval:  264 QTC Calculation: 472 R Axis:   110 Text Interpretation:  Supraventricular tachycardia Right axis deviation Repolarization abnormality, prob rate related Baseline wander in lead(s) V4 V5 V6 Confirmed by Deretha EmoryZACKOWSKI  MD, Suzannah Bettes [phone]([id number]) on 07/27/2016 11:28:55 AM       Patient seen by me along with physician assistant. Patient presenting with generalized weakness to the point where it difficulty for her to walk. Shortly after arrival patient developed a tachycardia with heart rate up around 180 consistent with a supraventricular tachycardia. No widened QRS. Patient's potassium is less than 2. This explains her generalized weakness. Will go ahead and try some potassium replacement before giving adenosine for the SVT. Patient's blood pressure is fine patient currently tolerating the fast heart rate okay. Would like to get some potassium on board before doing the adenosine. If patient becomes unstable will give adenosine or cardiovert. Patient back history most likely has had slow potassium loss. Has not had a check for a long period of time. Patient will require admission.  On exam heart is tachycardic lungs are clear bilaterally abdomen soft nontender patient is alert and oriented neurologically other than generalized weaknesses grossly intact.   CRITICAL CARE Performed by: Vanetta MuldersZACKOWSKI,Tauna Macfarlane Total critical care time: 45 minutes Critical care time was exclusive of separately billable procedures and treating other patients. Critical care was necessary to treat or prevent imminent or life-threatening deterioration. Critical care was time spent personally by me on the  following activities: development of treatment plan with patient and/or surrogate as well as nursing, discussions with consultants, evaluation of patient's response to treatment, examination of patient, obtaining history from patient or surrogate, ordering and performing treatments and interventions, ordering and review of laboratory studies, ordering and review of radiographic studies, pulse oximetry and re-evaluation of patient's condition.    Vanetta MuldersScott Matej Sappenfield, MD 07/27/16 [phone]

## 2016-07-27 NOTE — ED Notes (Signed)
CRITICAL VALUE ALERT  Critical value received:  Potassium 1.2  Date of notification:  07/27/2016  Time of notification:  1128  Critical value read back:  Yes  Nurse who received alert:  Sophronia SimasJackie Linette Gunderson  MD notified (1st page):  1128  Time of first page:  1129  MD notified (2nd page): Deretha EmoryZackowski  Time MD responded:  1129

## 2016-07-27 NOTE — H&P (Signed)
History and Physical  Kathleen Velazquez ZOX:[id number] DOB: Aug 17, 1955 DOA: 07/27/2016  PCP: Toma Deiters, MD  Patient coming from: home  Chief Complaint: weak  HPI:  60 year old woman PMH irritable bowel syndrome presents with worsening, profound diarrhea, poor oral intake. Found to have severe hypokalemia and hypomagnesemia and referred for admission.  Patient has chronic IBS, stools are always liquid. No recent antibiotics. No history of C. difficile. Over the last 2 weeks IBS has been worse with increased bowel movements. The last week she has had voluminous diarrhea, very poor oral intake. For the 2 days prior to presentation she stayed in bed, unable to walk secondary to generalized weakness. No sick contacts. No specific aggravating or alleviating factors. Does not take her beta blocker regularly.  ED Course: Treated with magnesium infusion, potassium infusion, normal saline bolus  Pertinent labs: Potassium less than 2. Magnesium 1.3. LFTs unremarkable. CBC unremarkable. Urinalysis negative. EKG: Independently reviewed. Supraventricular tachycardia. Rate related changes. Imaging: CT head no acute changes.  Review of Systems:  Negative for fever, new visual changes, sore throat, rash, new muscle aches, chest pain, SOB, dysuria, bleeding, n/v/abdominal pain.  Past Medical History:  Diagnosis Date  . Anxiety   . Arthritis    knees,   . Ejection fraction    Normal, echo, June, 2013  . GERD (gastroesophageal reflux disease)   . Headache(784.0)    hx of migraines   . Irritable bowel syndrome (IBS)   . PONV (postoperative nausea and vomiting)   . Sinus tachycardia    Persistent sinus tachycardia, June, 2013 ( prior monitor in 2007 had shown no significant abnormalities.)    Past Surgical History:  Procedure Laterality Date  . CHOLECYSTECTOMY    . HEMORRHOID SURGERY N/A 05/20/2013   Procedure: EXTENSIVE HEMORRHOIDECTOMY;  Surgeon: Dalia Heading, MD;  Location: [facility];   Service: General;  Laterality: N/A;  . JOINT REPLACEMENT     right knee replacement   . OTHER SURGICAL HISTORY     left breast biopsy - benign   . OTHER SURGICAL HISTORY     hx of skin cancer surgery   . TOTAL KNEE ARTHROPLASTY  11/28/2011   Procedure: TOTAL KNEE ARTHROPLASTY;  Surgeon: Loanne Drilling, MD;  Location: [facility];  Service: Orthopedics;  Laterality: Left;  . TUBAL LIGATION       reports that she has never smoked. She has never used smokeless tobacco. She reports that she does not drink alcohol or use drugs. Ambulatory   Allergies  Allergen Reactions  . Codeine Nausea And Vomiting    Family History  Problem Relation Age of Onset  . Irritable bowel syndrome Sister      Prior to Admission medications   Medication Sig Start Date End Date Taking? Authorizing Provider  desvenlafaxine (PRISTIQ) 50 MG 24 hr tablet Take 50 mg by mouth every morning.   Yes Historical Provider, MD  metoprolol succinate (TOPROL-XL) 25 MG 24 hr tablet Take 25 mg by mouth 2 (two) times daily.   Yes Historical Provider, MD  SUMAtriptan (IMITREX) 100 MG tablet Take 100 mg by mouth every 2 (two) hours as needed for migraine. May repeat in 2 hours if headache persists or recurs.   Yes Historical Provider, MD    Physical Exam: Vitals:   07/27/16 1300 07/27/16 1400 07/27/16 1430 07/27/16 1600  BP: 115/77 121/82 115/82 125/83  Pulse: 118 104 81 (!) 104  Resp: 15 25 20 20   Temp:      TempSrc:  SpO2: 98% 98% 99% 95%  Weight:      Height:       Constitutional:  . Appears calm and comfortable Eyes:  . PERRL and irises appear normal . Normal lids ENMT:  . grossly normal hearing Neck:  . neck appears normal, no masses . no thyromegaly Respiratory:  . CTA bilaterally, no w/r/r.  . Respiratory effort normal. No retractions or accessory muscle use Cardiovascular:  . RRR, no m/r/g . No LE extremity edema   Abdomen:  . Abdomen appears normal; no tenderness or masses . No  hernias Musculoskeletal:  . RUE, LUE, RLE, LLE   o Tone normal, generalized weakness noted. Able to move arms and legs.  Skin:  . No rashes, lesions, ulcers . palpation of skin: no induration or nodules Neurologic:  . Grossly unremarkable Psychiatric:  . judgement and insight appear normal . Mental status o Mood, affect appropriate  Wt Readings from Last 3 Encounters:  07/27/16 51.3 kg (113 lb)  05/16/13 59.4 kg (131 lb)  04/27/12 58.2 kg (128 lb 6.4 oz)    I have personally reviewed following labs and imaging studies  Labs on Admission:  CBC:  Recent Labs Lab 07/27/16 1053  WBC 8.6  NEUTROABS 5.8  HGB 13.3  HCT 38.3  MCV 99.2  PLT 181   Basic Metabolic Panel:  Recent Labs Lab 07/27/16 1053 07/27/16 1137  NA 143  --   K <2.0*  --   CL 100*  --   CO2 33*  --   GLUCOSE 115*  --   BUN 8  --   CREATININE 0.60  --   CALCIUM 8.8*  --   MG  --  1.3*   Liver Function Tests:  Recent Labs Lab 07/27/16 1053  AST 34  ALT 27  ALKPHOS 82  BILITOT 1.3*  PROT 5.9*  ALBUMIN 3.3*      Radiological Exams on Admission: Ct Head Wo Contrast  Result Date: 07/27/2016 CLINICAL DATA:  Lower extremity weakness for 1 day EXAM: CT HEAD WITHOUT CONTRAST TECHNIQUE: Contiguous axial images were obtained from the base of the skull through the vertex without intravenous contrast. COMPARISON:  None. FINDINGS: Brain: No mass effect, midline shift, or acute hemorrhage. Mild global atrophy. Minimal chronic ischemic changes in the periventricular white matter. Vascular: No hyperdense vessel or unexpected calcification. Skull: Normal. Negative for fracture or focal lesion. Sinuses/Orbits: No acute finding. Other: None. IMPRESSION: No acute intracranial pathology.  Mild chronic changes. Electronically Signed   By: Jolaine ClickArthur  Hoss M.D.   On: 07/27/2016 12:43      Principal Problem:   Hypokalemia Active Problems:   Generalized weakness   Hypomagnesemia   SVT (supraventricular  tachycardia) (HCC)   Diarrhea   IBS (irritable bowel syndrome)   Assessment/Plan 1. Profound hypokalemia secondary to diarrhea with associated bilateral lower extremity weakness. No neurologic deficits. 2. Generalized weakness, bilateral lower extremity weakness. 3. Hypomagnesemia. 4. Supraventricular tachycardia secondary to volume loss, missed dose of beta blocker. Reports chronic tachycardia since last knee replacement. 5. Diarrhea, most likely secondary to long-standing IBS. No history of C. difficile, no recent antibiotics, no fever.    Admit. Appears nontoxic.  Aggressive potassium and magnesium repletion  IV fluids. Check C. difficile, but low suspicion.  Resume beta blocker. Monitor hemodynamics.  DVT prophylaxis: SCDs Code Status: full code Family Communication: none  It is my clinical opinion that admission to INPATIENT is reasonable and necessary in this patient . presenting with symptoms of diarrhea, generalized  weakness . in the context of PMH including: Irritable bowel syndrome  . with pertinent positives on physical exam including: Generalized weakness, bilateral lower extremity weakness . and pertinent positives on radiographic and laboratory data including: Profound hypokalemia, hypomagnesemia.  Given the aforementioned, the predictability of an adverse outcome is felt to be significant. I expect that the patient will require at least 2 midnights in the hospital to treat this condition.  Time spent: 50 minutes  Brendia Sacksaniel Goodrich, MD  [facility] Direct contact: [phone] --Via amion app OR  --[email or web address]; password [email or web address]  7PM-7AM contact night coverage as above  07/27/2016, 7:04 PM

## 2016-07-27 NOTE — Progress Notes (Addendum)
Paged Dr. Irene LimboGoodrich about patient arriving to the floor when she came up around 1600. No response at this time and no new orders. Patient resting comfortably, will continue to monitor.   Dr. Irene LimboGoodrich called to verify that he knew patient was on floor. Orders in at this time. Will continue to monitor.

## 2016-07-28 ENCOUNTER — Inpatient Hospital Stay (HOSPITAL_COMMUNITY): Payer: No Typology Code available for payment source

## 2016-07-28 LAB — CBC
HCT: 32.8 % — ABNORMAL LOW (ref 36.0–46.0)
HEMOGLOBIN: 11.4 g/dL — AB (ref 12.0–15.0)
MCH: 34.9 pg — AB (ref 26.0–34.0)
MCHC: 34.8 g/dL (ref 30.0–36.0)
MCV: 100.3 fL — ABNORMAL HIGH (ref 78.0–100.0)
Platelets: 152 10*3/uL (ref 150–400)
RBC: 3.27 MIL/uL — AB (ref 3.87–5.11)
RDW: 14.3 % (ref 11.5–15.5)
WBC: 7 10*3/uL (ref 4.0–10.5)

## 2016-07-28 LAB — BASIC METABOLIC PANEL
ANION GAP: 6 (ref 5–15)
ANION GAP: 7 (ref 5–15)
ANION GAP: 10 (ref 5–15)
BUN: 6 mg/dL (ref 6–20)
BUN: 5 mg/dL — AB (ref 6–20)
BUN: <5 mg/dL — ABNORMAL LOW (ref 6–20)
CALCIUM: 7.4 mg/dL — AB (ref 8.9–10.3)
CALCIUM: 7.1 mg/dL — AB (ref 8.9–10.3)
CO2: 28 mmol/L (ref 22–32)
CO2: 22 mmol/L (ref 22–32)
CO2: 22 mmol/L (ref 22–32)
CREATININE: 0.63 mg/dL (ref 0.44–1.00)
Calcium: 7.4 mg/dL — ABNORMAL LOW (ref 8.9–10.3)
Chloride: 110 mmol/L (ref 101–111)
Chloride: 115 mmol/L — ABNORMAL HIGH (ref 101–111)
Chloride: 111 mmol/L (ref 101–111)
Creatinine, Ser: 0.55 mg/dL (ref 0.44–1.00)
Creatinine, Ser: 0.64 mg/dL (ref 0.44–1.00)
GFR calc Af Amer: >60 mL/min (ref >60)
GFR calc non Af Amer: >60 mL/min (ref >60)
GFR calc non Af Amer: >60 mL/min (ref >60)
GLUCOSE: 131 mg/dL — AB (ref 65–99)
Glucose, Bld: 115 mg/dL — ABNORMAL HIGH (ref 65–99)
Glucose, Bld: 237 mg/dL — ABNORMAL HIGH (ref 65–99)
POTASSIUM: 3.1 mmol/L — AB (ref 3.5–5.1)
Potassium: <2 mmol/L — CL (ref 3.5–5.1)
Potassium: 3.5 mmol/L (ref 3.5–5.1)
SODIUM: 144 mmol/L (ref 135–145)
SODIUM: 143 mmol/L (ref 135–145)
Sodium: 144 mmol/L (ref 135–145)

## 2016-07-28 LAB — MAGNESIUM: MAGNESIUM: 2 mg/dL (ref 1.7–2.4)

## 2016-07-28 LAB — BLOOD GAS, ARTERIAL
Acid-base deficit: 2.1 mmol/L — ABNORMAL HIGH (ref 0.0–2.0)
Bicarbonate: 22.5 mmol/L (ref 20.0–28.0)
Delivery systems: POSITIVE
Drawn by: 21310
EXPIRATORY PAP: 8
FIO2: 100
INSPIRATORY PAP: 16
O2 SAT: 94.7 %
PH ART: 7.372 (ref 7.350–7.450)
PO2 ART: 82.9 mmHg — AB (ref 83.0–108.0)
Patient temperature: 37
pCO2 arterial: 39.4 mmHg (ref 32.0–48.0)

## 2016-07-28 LAB — CK: CK TOTAL: 659 U/L — AB (ref 38–234)

## 2016-07-28 LAB — C DIFFICILE QUICK SCREEN W PCR REFLEX
C DIFFICILE (CDIFF) INTERP: NOT DETECTED
C DIFFICILE (CDIFF) TOXIN: NEGATIVE
C DIFFICLE (CDIFF) ANTIGEN: NEGATIVE

## 2016-07-28 LAB — PHOSPHORUS: Phosphorus: 1.4 mg/dL — ABNORMAL LOW (ref 2.5–4.6)

## 2016-07-28 LAB — TROPONIN I
TROPONIN I: 0.03 ng/mL — AB (ref <0.03)
Troponin I: 0.04 ng/mL (ref <0.03)
Troponin I: 0.03 ng/mL (ref <0.03)

## 2016-07-28 MED ORDER — POTASSIUM CHLORIDE 10 MEQ/100ML IV SOLN
10.0000 meq | INTRAVENOUS | Status: AC
  Administered 2016-07-28 (×4): 10 meq via INTRAVENOUS
  Filled 2016-07-28 (×4): qty 100

## 2016-07-28 MED ORDER — LOPERAMIDE HCL 2 MG PO CAPS
4.0000 mg | ORAL_CAPSULE | ORAL | Status: DC | PRN
  Administered 2016-07-28 (×2): 4 mg via ORAL
  Filled 2016-07-28 (×2): qty 2

## 2016-07-28 MED ORDER — MAGNESIUM SULFATE 2 GM/50ML IV SOLN
2.0000 g | Freq: Once | INTRAVENOUS | Status: AC
  Administered 2016-07-28: 2 g via INTRAVENOUS

## 2016-07-28 MED ORDER — MORPHINE SULFATE (PF) 2 MG/ML IV SOLN
2.0000 mg | Freq: Once | INTRAVENOUS | Status: AC
  Administered 2016-07-28: 2 mg via INTRAVENOUS

## 2016-07-28 MED ORDER — ONDANSETRON HCL 4 MG/2ML IJ SOLN
4.0000 mg | Freq: Four times a day (QID) | INTRAMUSCULAR | Status: DC | PRN
  Administered 2016-07-28 – 2016-07-29 (×2): 4 mg via INTRAVENOUS
  Filled 2016-07-28 (×2): qty 2

## 2016-07-28 MED ORDER — FUROSEMIDE 10 MG/ML IJ SOLN
40.0000 mg | Freq: Once | INTRAMUSCULAR | Status: AC
  Administered 2016-07-28: 40 mg via INTRAVENOUS
  Filled 2016-07-28: qty 4

## 2016-07-28 MED ORDER — IPRATROPIUM BROMIDE 0.02 % IN SOLN
0.5000 mg | Freq: Four times a day (QID) | RESPIRATORY_TRACT | Status: DC | PRN
  Administered 2016-07-28: 0.5 mg via RESPIRATORY_TRACT
  Filled 2016-07-28: qty 2.5

## 2016-07-28 MED ORDER — MORPHINE SULFATE (PF) 2 MG/ML IV SOLN
2.0000 mg | INTRAVENOUS | Status: DC | PRN
  Administered 2016-07-28 (×2): 2 mg via INTRAVENOUS
  Filled 2016-07-28 (×2): qty 1

## 2016-07-28 MED ORDER — LEVALBUTEROL HCL 1.25 MG/0.5ML IN NEBU
1.2500 mg | INHALATION_SOLUTION | Freq: Four times a day (QID) | RESPIRATORY_TRACT | Status: DC | PRN
  Administered 2016-07-28: 1.25 mg via RESPIRATORY_TRACT
  Filled 2016-07-28: qty 0.5

## 2016-07-28 MED ORDER — ASPIRIN 81 MG PO CHEW
324.0000 mg | CHEWABLE_TABLET | Freq: Every day | ORAL | Status: DC
  Administered 2016-07-28 – 2016-08-01 (×5): 324 mg via ORAL
  Filled 2016-07-28 (×5): qty 4

## 2016-07-28 MED ORDER — MAGNESIUM SULFATE 2 GM/50ML IV SOLN
INTRAVENOUS | Status: AC
  Filled 2016-07-28: qty 50

## 2016-07-28 MED ORDER — POTASSIUM PHOSPHATES 15 MMOLE/5ML IV SOLN
30.0000 mmol | Freq: Once | INTRAVENOUS | Status: AC
  Administered 2016-07-28: 30 mmol via INTRAVENOUS
  Filled 2016-07-28: qty 10

## 2016-07-28 MED ORDER — POTASSIUM CHLORIDE CRYS ER 20 MEQ PO TBCR
40.0000 meq | EXTENDED_RELEASE_TABLET | ORAL | Status: AC
  Administered 2016-07-28 – 2016-07-29 (×4): 40 meq via ORAL
  Filled 2016-07-28 (×4): qty 2

## 2016-07-28 MED ORDER — LORAZEPAM 2 MG/ML IJ SOLN
0.5000 mg | INTRAMUSCULAR | Status: AC | PRN
  Administered 2016-07-29 – 2016-07-30 (×2): 0.5 mg via INTRAVENOUS
  Filled 2016-07-28 (×2): qty 1

## 2016-07-28 MED ORDER — NITROGLYCERIN 2 % TD OINT
0.5000 [in_us] | TOPICAL_OINTMENT | Freq: Once | TRANSDERMAL | Status: AC
  Administered 2016-07-28: 0.5 [in_us] via TOPICAL
  Filled 2016-07-28: qty 1

## 2016-07-28 MED ORDER — NITROGLYCERIN 0.4 MG SL SUBL
0.4000 mg | SUBLINGUAL_TABLET | SUBLINGUAL | Status: DC | PRN
  Administered 2016-07-28 (×3): 0.4 mg via SUBLINGUAL
  Filled 2016-07-28: qty 1

## 2016-07-28 MED ORDER — POTASSIUM CHLORIDE 10 MEQ/100ML IV SOLN
10.0000 meq | INTRAVENOUS | Status: DC
  Administered 2016-07-28 (×3): 10 meq via INTRAVENOUS
  Filled 2016-07-28 (×4): qty 100

## 2016-07-28 MED ORDER — POTASSIUM CHLORIDE CRYS ER 20 MEQ PO TBCR
40.0000 meq | EXTENDED_RELEASE_TABLET | Freq: Once | ORAL | Status: AC
  Administered 2016-07-28: 40 meq via ORAL
  Filled 2016-07-28: qty 2

## 2016-07-28 MED ORDER — ENOXAPARIN SODIUM 40 MG/0.4ML ~~LOC~~ SOLN
40.0000 mg | SUBCUTANEOUS | Status: DC
  Administered 2016-07-28 – 2016-07-31 (×4): 40 mg via SUBCUTANEOUS
  Filled 2016-07-28 (×4): qty 0.4

## 2016-07-28 NOTE — Progress Notes (Signed)
CRITICAL VALUE ALERT  Critical value received:  Potassium level Less than 2  Date of notification:  07/28/2016  Time of notification:  0536  Critical value read back:  Nurse who received alert:  Selina Cooleyameron RN  MD notified (1st page): Dr. Ardyth GalGaring  Time of first page:  0540    Time MD responded:  [phone]

## 2016-07-28 NOTE — Progress Notes (Signed)
**Note De-Identified Reedy Biernat Obfuscation** EKG complete; critical results reported to RN

## 2016-07-28 NOTE — Progress Notes (Addendum)
PROGRESS NOTE  Kathleen Velazquez [id number] DOB: [birth year] DOA: 07/27/2016 PCP: [name] A HASANAJ, MD  Brief Narrative: 60 year old woman PMH irritable bowel syndrome presents with worsening, profound diarrhea, poor oral intake. Found to have severe hypokalemia and hypomagnesemia and referred for admission.  Assessment/Plan: 1. Profound hypokalemia secondary to voluminous diarrhea, associated with bilateral lower extremity weakness.  Potassium remains quite low. Remains globally weak.  Continue aggressive oral and IV potassium replacement. 2. Hypomagnesemia  Resolved status post repletion. 3. Supraventricular tachycardia secondary to volume loss, missed dose of beta blocker. Reports chronic tachycardia since last knee replacement.  Resolved with restarting of beta blocker 4. Generalized weakness, bilateral lower extremity weakness  Expect will improve with potassium repletion. 5. IBS with chronic long-standing watery diarrhea. C. difficile negative.  Add Imodium. Consider Questran if ineffective.   Nontoxic but remains with profound and symptomatic hypokalemia. Not ready for discharge.  ADDENDUM 1700 Late entry CTSP for acute SOB, new hypoxia Sudden onset, associated with chest pressure 7/10 Lungs clear, shallow breaths, fair air movement, spoke in short sentences STAT CXR pulm edema, troponin .04, potassium 3.1, phos 1.4, CK 659 EKG independent review ST, anteroseptal MI, old compared to 05/16/13 EKG, prolonged QT.   A/P Pulm edema, hypoxia, complicated by urinary retention UOP >700 s/p Lasix; foley placed F/u exam, improved overall, decreased resp rate, minimal chest tightness Will repeat Lasix this evening, replace potassium, phosphorus, trend troponin but likely elevated related to CK (from hypokalemia). No evidence of ACS.  DVT prophylaxis: Lovenox Code Status: full code Family Communication: none Disposition Plan: home  [name], MD  [facility] Direct contact: [phone] --Via amion app OR  --[email or web address]; password [email or web address]  7PM-7AM contact night coverage as above 07/28/2016, 8:28 AM  LOS: 1 day   Consultants:    Procedures:    Antimicrobials:    Interval history/Subjective: Continues to have high volume diarrhea. Appetite okay. No vomiting. No pain. Remains generally weak.  Objective: Vitals:   07/28/16 0026 07/28/16 0514 07/28/16 0700 07/28/16 0800  BP:  117/84 111/80 (!) 126/93  Pulse:  97 95 (!) 105  Resp:  (!) 23 (!) 25 (!) 31  Temp: 98.7 F (37.1 C)     TempSrc: Oral     SpO2:  93% 91% 93%  Weight:      Height:        Intake/Output Summary (Last 24 hours) at 07/28/16 0828 Last data filed at 07/28/16 [id number]  Gross per 24 hour  Intake             2670 ml  Output              975 ml  Net             1695 ml     Filed Weights   07/27/16 0903 07/27/16 0909  Weight: 51.3 kg (113 lb) 51.3 kg (113 lb)    Exam: Constitutional:   Appears calm, comfortable Respiratory:   Clear to auscultation bilaterally. No wheezes, rales or rhonchi. Normal respiratory effort. Cardiovascular:   Tachycardic, regular rhythm. No murmur, rub or gallop.  Telemetry sinus tachycardia. Frequent ectopy, nonsustained V. Tach.  No lower extremity edema Abdomen:   Positive bowel sounds. Soft, nontender, nondistended. Musculoskeletal:   General global weakness. Moves all extremities to command. Psychiatric:   Alert, speech fluent and clear. Mood and affect. Grossly normal.  I have personally reviewed following labs and imaging studies:  Potassium less than 2.0. Magnesium 2.0.  C. difficile screen negative.  CBC unremarkable.  Scheduled Meds: . feeding supplement (ENSURE ENLIVE)  237 mL Oral BID BM  . Influenza vac split quadrivalent PF  0.5 mL Intramuscular Tomorrow-1000  . metoprolol succinate  25 mg Oral BID  . potassium chloride  10 mEq Intravenous Q1 Hr x 4  . potassium chloride  40 mEq  Oral Q4H  . sodium chloride flush  3 mL Intravenous Q12H  . venlafaxine XR  75 mg Oral Q breakfast   Continuous Infusions: . sodium chloride 150 mL/hr at 07/28/16 0800    Principal Problem:   Hypokalemia Active Problems:   Generalized weakness   Hypomagnesemia   SVT (supraventricular tachycardia) (HCC)   Diarrhea   IBS (irritable bowel syndrome)   LOS: 1 day

## 2016-07-28 NOTE — Progress Notes (Signed)
The patient was seen because an 02 saturation of 80% on 15 LPM.  Subjective: Feels anxious and dyspneic.   Objective: Vital signs in last 24 hours: Temp:  [98.4 F (36.9 C)-98.7 F (37.1 C)] 98.4 F (36.9 C) (12/21 0730) Pulse Rate:  [83-123] 122 (12/21 2044) Resp:  [17-39] 36 (12/21 1800) BP: (101-148)/(68-115) 142/104 (12/21 1800) SpO2:  [85 %-97 %] 91 % (12/21 2055) FiO2 (%):  [100 %] 100 % (12/21 2055) Weight change:  Last BM Date: 07/28/16  Intake/Output from previous day: 12/20 0701 - 12/21 0700 In: 1195 [I.V.:645; IV Piggyback:550] Out: 875 [Urine:875] Intake/Output this shift: No intake/output data recorded.  Physical Exam:  General: Anxious. HEENT:. Normocephalic Neck: No JVD Lungs: Bibasilar rales and bilateral scattered roncchi. CV: S1S2 at 140 bpm, no murmurs. Abd: Soft, NT Ext: No edema.   Lab Results:  Recent Labs  07/27/16 1053 07/28/16 0439  WBC 8.6 7.0  HGB 13.3 11.4*  HCT 38.3 32.8*  PLT 181 152   BMET  Recent Labs  07/28/16 0439 07/28/16 1538  NA 144 144  K <2.0* 3.1*  CL 110 115*  CO2 28 22  GLUCOSE 115* 131*  BUN 6 5*  CREATININE 0.55 0.64  CALCIUM 7.4* 7.4*    Studies/Results: Ct Head Wo Contrast  Result Date: 07/27/2016 CLINICAL DATA:  Lower extremity weakness for 1 day EXAM: CT HEAD WITHOUT CONTRAST TECHNIQUE: Contiguous axial images were obtained from the base of the skull through the vertex without intravenous contrast. COMPARISON:  None. FINDINGS: Brain: No mass effect, midline shift, or acute hemorrhage. Mild global atrophy. Minimal chronic ischemic changes in the periventricular white matter. Vascular: No hyperdense vessel or unexpected calcification. Skull: Normal. Negative for fracture or focal lesion. Sinuses/Orbits: No acute finding. Other: None. IMPRESSION: No acute intracranial pathology.  Mild chronic changes. Electronically Signed   By: Jolaine ClickArthur  Hoss M.D.   On: 07/27/2016 12:43   Dg Chest Port 1 View  Result  Date: 07/28/2016 CLINICAL DATA:  Hypoxia. Chest pressure/ pain and shortness of breath. EXAM: PORTABLE CHEST 1 VIEW COMPARISON:  05/20/2013 and 11/21/2011. FINDINGS: 1452 hour. The heart now appears mildly enlarged. There are lower lung volumes with diffuse interstitial prominence suspicious for mild pulmonary edema. Right infrahilar fullness is likely vascular. There is no confluent airspace opacity, pneumothorax or significant pleural effusion. IMPRESSION: Cardiomegaly with probable pulmonary edema, most consistent with congestive heart failure. Radiographic follow up recommended. Electronically Signed   By: Carey BullocksWilliam  Veazey M.D.   On: 07/28/2016 15:05    Medications: Reviewed.  Assessment/Plan: 1) Pulmonary edema and hypoxia. Will start BiPAP ventilation. 1/2 inch NTP.now. Morphine sulfate 2 mg IVP. The patient received furosemide 40 mg IVP recently. Magnesium sulfate 2 gr IVPB. Xopenex and ipratropium nebs every 6 hours. Check troponin and BMP now. ABG in one hour.   LOS: 1 day   Kathleen Velazquez 07/28/2016, 9:06 PM

## 2016-07-28 NOTE — Progress Notes (Signed)
Initial Nutrition Assessment  DOCUMENTATION CODES:  Not applicable  INTERVENTION:  Ensure Enlive po BID, each supplement provides 350 kcal and 20 grams of protein  NUTRITION DIAGNOSIS:  Altered nutrition lab value related to  Profound diarrhea as evidenced by Potassium of 1.2.  GOAL:  Patient will meet greater than or equal to 90% of their needs  MONITOR:  PO intake, Supplement acceptance, Labs, I & O's  REASON FOR ASSESSMENT:  Malnutrition Screening Tool    ASSESSMENT:  60 y/o female PMHx IBS,  Anxiety, GERD. She presented with worsening IBS x2 weeks. Reports 1 week poor PO intake and voluminous diarrhea. Last 2 days she spent in bed due to inability to walk. Presented via EMS and found to be severely hypokalemic.     Pt reports that for the past week or so she has had severe postprandial diarrhea. She says she has not had any problems with her appetite. She feels hungry, but everything she eats goes right through her.   She has not noticed any pattern in what foods cause more or less diarrhea, though she says she does try to avoid greasier foods. She says she has been drinking Ensure/Boost as well, but this sounds to have the same affect on her. She did not take any vitamins because all vitamins, no matter what form,  "make her sick".   She says her UBW was roughly 128 lbs. Per chart review, there is a large gap in her wt history and unable to conform and relevant, significant weight loss  She said she has had IBS for years, but surprisingly has never seen a gastroenterologist, nor has she been trying antidiarrheals. She says she plans on seeing one after this admission.   While admitted, she is agreeable to continue Ensure as ordered.   NFPE: Deferred  Labs: Severe hypokalemia, Albumin: 3.3. Cdiff negative Medications: KCL    Recent Labs Lab 07/27/16 1053 07/27/16 1137 07/28/16 0439  NA 143  --  144  K <2.0*  --  <2.0*  CL 100*  --  110  CO2 33*  --  28  BUN 8  --   6  CREATININE 0.60  --  0.55  CALCIUM 8.8*  --  7.4*  MG  --  1.3* 2.0  GLUCOSE 115*  --  115*   Diet Order:  Diet regular Room service appropriate? Yes; Fluid consistency: Thin  Skin: MSAD to groin/buttocks  Last BM:  12/21 (diarrhea-incontinent)  Height:  Ht Readings from Last 1 Encounters:  07/27/16 5\' 2"  (1.575 m)   Weight:  Wt Readings from Last 1 Encounters:  07/27/16 113 lb (51.3 kg)   Wt Readings from Last 10 Encounters:  07/27/16 113 lb (51.3 kg)  05/16/13 131 lb (59.4 kg)  04/27/12 128 lb 6.4 oz (58.2 kg)  01/16/12 128 lb (58.1 kg)  11/28/11 134 lb (60.8 kg)  11/21/11 134 lb 4.8 oz (60.9 kg)   Ideal Body Weight:  50 kg  BMI:  Body mass index is 20.67 kg/m.  Estimated Nutritional Needs:  Kcal:  1550-1750 kcals (31-34 kcal/kg bw) Protein:  62-72 g (1.2-1.4 g/kg bw) Fluid:  >1.5 L fluid (30 ml/kg + enough to replace stool losses)  EDUCATION NEEDS:  No education needs identified at this time  Christophe LouisNathan Glenn Gullickson RD, LDN, CNSC Clinical Nutrition Pager: [phone] 07/28/2016 2:59 PM

## 2016-07-28 NOTE — Care Management Note (Signed)
Case Management Note  Patient Details  Name: Kathleen RegalKathy R Kenagy MRN: [id number] Date of Birth: 1955/08/27  Subjective/Objective:                  Pt admitted with hypkalemia. She is from home, lives alone and is ind with ADL's. She cares for her husbands mother. She has PCP, transportation and no difficulty affording or managing medications. She reports having cane and BSC PTA but feels she would benefit from RW. She has chosen [facility] from list of DME providers. Alroy BailiffLinda Lothian, of [facility], aware of referral and will obtain pt info from chart. Pt plans to return home with self care.   Action/Plan: RW will be delivered to pt room. CM will cont to follow for DC needs.   Expected Discharge Date:     07/30/2016             Expected Discharge Plan:  Home/Self Care  In-House Referral:  NA  Discharge planning Services  CM Consult  Post Acute Care Choice:  Durable Medical Equipment Choice offered to:  Patient  DME Arranged:  Dan HumphreysWalker rolling DME Agency:  [facility]  Status of Service:  In process, will continue to follow   Malcolm MetroChildress, Lynee Rosenbach Demske, RN 07/28/2016, 2:37 PM

## 2016-07-29 ENCOUNTER — Inpatient Hospital Stay (HOSPITAL_COMMUNITY): Payer: No Typology Code available for payment source

## 2016-07-29 DIAGNOSIS — I509 Heart failure, unspecified: Secondary | ICD-10-CM

## 2016-07-29 DIAGNOSIS — E876 Hypokalemia: Principal | ICD-10-CM

## 2016-07-29 LAB — ECHOCARDIOGRAM COMPLETE
AVLVOTPG: 3 mmHg
CHL CUP RV SYS PRESS: 26 mmHg
CHL CUP TV REG PEAK VELOCITY: 239 cm/s
FS: 31 % (ref 28–44)
Height: 62 in
IVS/LV PW RATIO, ED: 1.01
LA ID, A-P, ES: 26 mm
LA diam end sys: 26 mm
LA vol index: 23.7 mL/m2
LADIAMINDEX: 1.74 cm/m2
LAVOL: 35.5 mL
LAVOLA4C: 38 mL
LV TDI E'MEDIAL: 7.72
LV dias vol index: 20 mL/m2
LVDIAVOL: 30 mL — AB (ref 46–106)
LVELAT: 8.59 cm/s
LVOT SV: 24 mL
LVOT VTI: 13.5 cm
LVOT area: 1.77 cm2
LVOT diameter: 15 mm
LVOTPV: 89.3 cm/s
LVSYSVOL: 13 mL — AB (ref 14–42)
LVSYSVOLIN: 8 mL/m2
PW: 10.1 mm — AB (ref 0.6–1.1)
RV LATERAL S' VELOCITY: 11.6 cm/s
RV TAPSE: 17.1 mm
Simpson's disk: 58
Stroke v: 18 ml
TDI e' lateral: 8.59
TRMAXVEL: 239 cm/s
Weight: 1808 oz

## 2016-07-29 LAB — GLUCOSE, CAPILLARY: GLUCOSE-CAPILLARY: 238 mg/dL — AB (ref 65–99)

## 2016-07-29 LAB — POTASSIUM
POTASSIUM: 7.4 mmol/L — AB (ref 3.5–5.1)
POTASSIUM: 4.5 mmol/L (ref 3.5–5.1)
POTASSIUM: 3.7 mmol/L (ref 3.5–5.1)
Potassium: 7.3 mmol/L (ref 3.5–5.1)

## 2016-07-29 LAB — BASIC METABOLIC PANEL
Anion gap: 7 (ref 5–15)
BUN: 5 mg/dL — AB (ref 6–20)
CHLORIDE: 115 mmol/L — AB (ref 101–111)
CO2: 22 mmol/L (ref 22–32)
CREATININE: 0.65 mg/dL (ref 0.44–1.00)
Calcium: 7.2 mg/dL — ABNORMAL LOW (ref 8.9–10.3)
GFR calc Af Amer: >60 mL/min (ref >60)
GFR calc non Af Amer: >60 mL/min (ref >60)
GLUCOSE: 169 mg/dL — AB (ref 65–99)
Potassium: 5.8 mmol/L — ABNORMAL HIGH (ref 3.5–5.1)
SODIUM: 144 mmol/L (ref 135–145)

## 2016-07-29 LAB — TROPONIN I
Troponin I: 0.04 ng/mL (ref <0.03)
Troponin I: 0.04 ng/mL (ref <0.03)
Troponin I: 0.04 ng/mL (ref <0.03)

## 2016-07-29 LAB — PHOSPHORUS: Phosphorus: 5 mg/dL — ABNORMAL HIGH (ref 2.5–4.6)

## 2016-07-29 LAB — MAGNESIUM: MAGNESIUM: 2.2 mg/dL (ref 1.7–2.4)

## 2016-07-29 MED ORDER — SODIUM CHLORIDE 0.9 % IV SOLN
1.0000 g | Freq: Once | INTRAVENOUS | Status: AC
  Administered 2016-07-29: 1 g via INTRAVENOUS
  Filled 2016-07-29 (×2): qty 10

## 2016-07-29 MED ORDER — FUROSEMIDE 10 MG/ML IJ SOLN
20.0000 mg | Freq: Once | INTRAMUSCULAR | Status: DC
  Administered 2016-07-29: 20 mg via INTRAVENOUS
  Filled 2016-07-29: qty 2

## 2016-07-29 MED ORDER — IOPAMIDOL (ISOVUE-370) INJECTION 76%
100.0000 mL | Freq: Once | INTRAVENOUS | Status: AC | PRN
  Administered 2016-07-29: 100 mL via INTRAVENOUS

## 2016-07-29 MED ORDER — NITROGLYCERIN 2 % TD OINT
0.5000 [in_us] | TOPICAL_OINTMENT | Freq: Four times a day (QID) | TRANSDERMAL | Status: AC
  Administered 2016-07-29: 0.5 [in_us] via TOPICAL
  Filled 2016-07-29: qty 1

## 2016-07-29 MED ORDER — SODIUM POLYSTYRENE SULFONATE 15 GM/60ML PO SUSP
15.0000 g | Freq: Once | ORAL | Status: AC
Start: 2016-07-29 — End: 2016-07-29
  Administered 2016-07-29: 15 g via ORAL
  Filled 2016-07-29: qty 60

## 2016-07-29 MED ORDER — FUROSEMIDE 10 MG/ML IJ SOLN
40.0000 mg | Freq: Once | INTRAMUSCULAR | Status: AC
  Administered 2016-07-29: 40 mg via INTRAVENOUS
  Filled 2016-07-29: qty 4

## 2016-07-29 MED ORDER — ALBUTEROL SULFATE (2.5 MG/3ML) 0.083% IN NEBU: 2.5000 mg | INHALATION_SOLUTION | RESPIRATORY_TRACT | Status: DC | PRN

## 2016-07-29 MED ORDER — INSULIN ASPART 100 UNIT/ML IV SOLN
10.0000 [IU] | Freq: Once | INTRAVENOUS | Status: AC
  Administered 2016-07-29: 10 [IU] via INTRAVENOUS

## 2016-07-29 MED ORDER — METOLAZONE 5 MG PO TABS
2.5000 mg | ORAL_TABLET | Freq: Once | ORAL | Status: AC
  Administered 2016-07-29: 2.5 mg via ORAL
  Filled 2016-07-29: qty 1

## 2016-07-29 MED ORDER — ALBUTEROL SULFATE (2.5 MG/3ML) 0.083% IN NEBU
10.0000 mg | INHALATION_SOLUTION | Freq: Once | RESPIRATORY_TRACT | Status: AC
  Administered 2016-07-29: 10 mg via RESPIRATORY_TRACT
  Filled 2016-07-29: qty 12

## 2016-07-29 MED ORDER — DEXTROSE 50 % IV SOLN
1.0000 | Freq: Once | INTRAVENOUS | Status: AC
  Administered 2016-07-29: 50 mL via INTRAVENOUS

## 2016-07-29 MED ORDER — CALCIUM GLUCONATE 10 % IV SOLN
1.0000 g | Freq: Once | INTRAVENOUS | Status: DC
  Administered 2016-07-29: 1 g via INTRAVENOUS

## 2016-07-29 MED ORDER — SODIUM BICARBONATE 8.4 % IV SOLN
50.0000 meq | Freq: Once | INTRAVENOUS | Status: AC
  Administered 2016-07-29: 50 meq via INTRAVENOUS
  Filled 2016-07-29: qty 50

## 2016-07-29 MED ORDER — DEXTROSE 50 % IV SOLN
INTRAVENOUS | Status: AC
  Administered 2016-07-29: 50 mL
  Filled 2016-07-29: qty 50

## 2016-07-29 MED ORDER — SODIUM POLYSTYRENE SULFONATE 15 GM/60ML PO SUSP
15.0000 g | Freq: Once | ORAL | Status: AC
  Administered 2016-07-29: 15 g via ORAL

## 2016-07-29 NOTE — Progress Notes (Signed)
Bp has been running soft while on my shift in the 80's and 90's. Md notified due to scheduled metoprolol and nitroglycerin paste. MD added order parameters. Medications not given/held. Will continue to monitor  Cristie Hemiffani K Kasey Ewings, RN 11:29 PM 07/29/16

## 2016-07-29 NOTE — Progress Notes (Signed)
PROGRESS NOTE                                                                                                                                                                                                             Patient Demographics:    Kathleen Velazquez, is a 60 y.o. female, DOB - 1956-06-28, WGN:[id number]  Admit date - 07/27/2016   Admitting Physician Standley Brookinganiel P Goodrich, MD  Outpatient Primary MD for the patient is [name] A HASANAJ, MD  LOS - 2  Chief Complaint  Patient presents with  . Weakness    cannot walk       Brief Narrative   60 year old woman PMH irritable bowel syndrome presents with worsening, profound diarrhea, poor oral intake. Found to have severe hypokalemia and hypomagnesemia and referred for admission.   Next morning when I saw her she had developed hyperkalemia along with CHF and required BiPAP overnight.   Subjective:    Kathleen Velazquez today has, No headache, No chest pain, No abdominal pain - No Nausea, No new weakness tingling or numbness, No Cough - ++ SOB.    Assessment  & Plan :    1.Diarrhea induced initial hypokalemia and hypomagnesemia with generalized weakness. All resolved with replacement.  2. Hyperkalemia. Due to potassium replacement, hyperkalemia protocol followed will monitor potassium and magnesium levels.  3. SVT secondary to dehydration. Resolved on beta blocker continue.  4. Acute hypoxic respiratory failure requiring BiPAP secondary to acute on chronic diastolic CHF last EF 60%. Due to IV fluid replacement, IV fluids stopped, started on Lasix, Zaroxolyn and Nitropaste. Will monitor. Supportive care to continue with oxygen and nebulizer treatments.  5. Generalized weakness due to dehydration. Dehydration resolved, initiate PT and activity.  6. IBS with chronic diarrhea. Placed on Imodium will monitor. C. difficile negative.    Family Communication  :  None  Code  Status :  Full  Diet : Diet regular Room service appropriate? Yes; Fluid consistency: Thin   Disposition Plan  :  Step down  Consults  :  None  Procedures  :    TTE  DVT Prophylaxis  :  Lovenox    Lab Results  Component Value Date   PLT 152 07/28/2016    Inpatient Medications  Scheduled Meds: . aspirin  324 mg Oral Daily  . calcium gluconate  1  g Intravenous Once  . enoxaparin (LOVENOX) injection  40 mg Subcutaneous Q24H  . feeding supplement (ENSURE ENLIVE)  237 mL Oral BID BM  . furosemide  40 mg Intravenous Once  . Influenza vac split quadrivalent PF  0.5 mL Intramuscular Tomorrow-1000  . metolazone  2.5 mg Oral Once  . metoprolol succinate  25 mg Oral BID  . sodium chloride flush  3 mL Intravenous Q12H  . sodium polystyrene  15 g Oral Once  . venlafaxine XR  75 mg Oral Q breakfast   Continuous Infusions: PRN Meds:.ipratropium, levalbuterol, loperamide, LORazepam, morphine injection, nitroGLYCERIN, ondansetron  Antibiotics  :    Anti-infectives    None         Objective:   Vitals:   07/29/16 0630 07/29/16 0754 07/29/16 0758 07/29/16 0801  BP: 101/82     Pulse: (!) 107     Resp: (!) 25     Temp:  98.5 F (36.9 C)    TempSrc:  Axillary    SpO2: 97%  95% 95%  Weight:      Height:        Wt Readings from Last 3 Encounters:  07/27/16 51.3 kg (113 lb)  05/16/13 59.4 kg (131 lb)  04/27/12 58.2 kg (128 lb 6.4 oz)     Intake/Output Summary (Last 24 hours) at 07/29/16 0856 Last data filed at 07/29/16 0102  Gross per 24 hour  Intake             1250 ml  Output             2150 ml  Net             -900 ml     Physical Exam  Awake Alert, Oriented X 3, No new F.N deficits, Normal affect [place].AT,PERRAL Supple Neck,No JVD, No cervical lymphadenopathy appriciated.  Symmetrical Chest wall movement, Good air movement bilaterally, few rales RRR,No Gallops,Rubs or new Murmurs, No Parasternal Heave +ve B.Sounds, Abd Soft, No tenderness, No organomegaly  appriciated, No rebound - guarding or rigidity. No Cyanosis, Clubbing or edema, No new Rash or bruise       Data Review:    CBC  Recent Labs Lab 07/27/16 1053 07/28/16 0439  WBC 8.6 7.0  HGB 13.3 11.4*  HCT 38.3 32.8*  PLT 181 152  MCV 99.2 100.3*  MCH 34.5* 34.9*  MCHC 34.7 34.8  RDW 13.9 14.3  LYMPHSABS 2.0  --   MONOABS 0.7  --   EOSABS 0.0  --   BASOSABS 0.0  --     Chemistries   Recent Labs Lab 07/27/16 1053 07/27/16 1137 07/28/16 0439 07/28/16 1538 07/28/16 2056 07/29/16 0230 07/29/16 0658 07/29/16 0753  NA 143  --  144 144 143 144  --   --   K <2.0*  --  <2.0* 3.1* 3.5 5.8* 7.4* 7.3*  CL 100*  --  110 115* 111 115*  --   --   CO2 33*  --  28 22 22 22   --   --   GLUCOSE 115*  --  115* 131* 237* 169*  --   --   BUN 8  --  6 5* <5* 5*  --   --   CREATININE 0.60  --  0.55 0.64 0.63 0.65  --   --   CALCIUM 8.8*  --  7.4* 7.4* 7.1* 7.2*  --   --   MG  --  1.3* 2.0  --   --  2.2  --   --   AST 34  --   --   --   --   --   --   --   ALT 27  --   --   --   --   --   --   --   ALKPHOS 82  --   --   --   --   --   --   --   BILITOT 1.3*  --   --   --   --   --   --   --    ------------------------------------------------------------------------------------------------------------------ No results for input(s): CHOL, HDL, LDLCALC, TRIG, CHOLHDL, LDLDIRECT in the last 72 hours.  No results found for: HGBA1C ------------------------------------------------------------------------------------------------------------------ No results for input(s): TSH, T4TOTAL, T3FREE, THYROIDAB in the last 72 hours.  Invalid input(s): FREET3 ------------------------------------------------------------------------------------------------------------------ No results for input(s): VITAMINB12, FOLATE, FERRITIN, TIBC, IRON, RETICCTPCT in the last 72 hours.  Coagulation profile No results for input(s): INR, PROTIME in the last 168 hours.  No results for input(s): DDIMER in the  last 72 hours.  Cardiac Enzymes  Recent Labs Lab 07/28/16 2056 07/29/16 0230 07/29/16 0800  TROPONINI 0.03* 0.04* 0.04*   ------------------------------------------------------------------------------------------------------------------ No results found for: BNP  Micro Results Recent Results (from the past 240 hour(s))  MRSA PCR Screening     Status: None   Collection Time: 07/27/16  3:46 PM  Result Value Ref Range Status   MRSA by PCR NEGATIVE NEGATIVE Final    Comment:        The GeneXpert MRSA Assay (FDA approved for NASAL specimens only), is one component of a comprehensive MRSA colonization surveillance program. It is not intended to diagnose MRSA infection nor to guide or monitor treatment for MRSA infections.   C difficile quick scan w PCR reflex     Status: None   Collection Time: 07/27/16  6:58 PM  Result Value Ref Range Status   C Diff antigen NEGATIVE NEGATIVE Final   C Diff toxin NEGATIVE NEGATIVE Final   C Diff interpretation No C. difficile detected.  Final    Radiology Reports Ct Head Wo Contrast  Result Date: 07/27/2016 CLINICAL DATA:  Lower extremity weakness for 1 day EXAM: CT HEAD WITHOUT CONTRAST TECHNIQUE: Contiguous axial images were obtained from the base of the skull through the vertex without intravenous contrast. COMPARISON:  None. FINDINGS: Brain: No mass effect, midline shift, or acute hemorrhage. Mild global atrophy. Minimal chronic ischemic changes in the periventricular white matter. Vascular: No hyperdense vessel or unexpected calcification. Skull: Normal. Negative for fracture or focal lesion. Sinuses/Orbits: No acute finding. Other: None. IMPRESSION: No acute intracranial pathology.  Mild chronic changes. Electronically Signed   By: Jolaine ClickArthur  Hoss M.D.   On: 07/27/2016 12:43   Dg Chest Port 1 View  Result Date: 07/29/2016 CLINICAL DATA:  Shortness of Breath EXAM: PORTABLE CHEST 1 VIEW COMPARISON:  July 28, 2016 FINDINGS: There is  persistence of interstitial pulmonary edema. There is extensive new alveolar opacity throughout both mid and lower lung zones, slightly more on the left than on the right. There are bilateral pleural effusions with cardiomegaly. There is pulmonary venous hypertension. No adenopathy evident. IMPRESSION: Progression of congestive heart failure, now or pleural effusions an apparent alveolar edema as well as persistent interstitial edema. A degree of superimposed pneumonia cannot be excluded radiographically. The cardiac silhouette is stable. Electronically Signed   By: Bretta BangWilliam  Woodruff III M.D.   On: 07/29/2016 08:16   [facility]  Result Date: 07/28/2016 CLINICAL DATA:  Hypoxia. Chest pressure/ pain and shortness of breath. EXAM: PORTABLE CHEST 1 VIEW COMPARISON:  05/20/2013 and 11/21/2011. FINDINGS: 1452 hour. The heart now appears mildly enlarged. There are lower lung volumes with diffuse interstitial prominence suspicious for mild pulmonary edema. Right infrahilar fullness is likely vascular. There is no confluent airspace opacity, pneumothorax or significant pleural effusion. IMPRESSION: Cardiomegaly with probable pulmonary edema, most consistent with congestive heart failure. Radiographic follow up recommended. Electronically Signed   By: Carey Bullocks M.D.   On: 07/28/2016 15:05    Time Spent in minutes  30   SINGH,PRASHANT K M.D on 07/29/2016 at 8:56 AM  Between 7am to 7pm - Pager - [phone]  After 7pm go to [email or web address] - password [facility]  [facility] -  Office  [phone]

## 2016-07-29 NOTE — Progress Notes (Signed)
*  PRELIMINARY RESULTS* Echocardiogram 2D Echocardiogram has been performed.  Stacey DrainWhite, [place] Ackert J 07/29/2016, 3:59 PM

## 2016-07-29 NOTE — Progress Notes (Addendum)
WHEN PT AWAKEN WHEN STAFF IN ROOM SHE COMMENTS HOW MUCH BETTER SHE FEELS. SHE IS MOST HAPPY THAT SHE CAN NOW MOVE HER LEGS W/O DIFFICULTY.

## 2016-07-29 NOTE — Evaluation (Signed)
Physical Therapy Evaluation Patient Details Name: Kathleen Velazquez MRN: [id number] DOB: 1956-06-23 Today's Date: 07/29/2016   History of Present Illness  60 year old woman PMH irritable bowel syndrome presents with worsening, profound diarrhea, poor oral intake. Found to have severe hypokalemia and hypomagnesemia and referred for admission.  Clinical Impression  Pt was received in her bed, A&Ox4 and willing to participate in PT evaluation. She denies any pain currently, but was needing to use the restroom. She presents with overall weakness in her UE/LE and trunk, but she was able to perform all bed mobility with modified independence. She was able to stand from her bed with CGA and from the toilet with minA. She was able to ambulate with no more than CGA. I discussed the benefits of HHPT with a RW and the pt was adamant about not receiving home PT. If she is unwilling to have HHPT she will need a RW at home with supervision for mobility/OOB activity.     Follow Up Recommendations Supervision for mobility/OOB;Home health PT    Equipment Recommendations  Rolling walker with 5" wheels    Recommendations for Other Services       Precautions / Restrictions Restrictions Weight Bearing Restrictions: No      Mobility  Bed Mobility Overal bed mobility: Modified Independent             General bed mobility comments: assistance with lines/leads and tubes  Transfers Overall transfer level: Needs assistance Equipment used: Rolling walker (2 wheeled) Transfers: Sit to/from Stand Sit to Stand: Min guard;Min assist         General transfer comment: CGA from bed and MinA x2 attempts from toilet   Ambulation/Gait Ambulation/Gait assistance: Min guard Ambulation Distance (Feet): 16 Feet Assistive device: Rolling walker (2 wheeled) Gait Pattern/deviations: Decreased step length - left;Decreased stance time - right;Trunk flexed   Gait velocity interpretation: <1.8 ft/sec, indicative  of risk for recurrent falls    Stairs            Wheelchair Mobility    Modified Rankin (Stroke Patients Only)       Balance Overall balance assessment: Needs assistance Sitting-balance support: Bilateral upper extremity supported;Feet unsupported Sitting balance-Leahy Scale: Good     Standing balance support: Bilateral upper extremity supported Standing balance-Leahy Scale: Good                               Pertinent Vitals/Pain Pain Assessment: No/denies pain    Home Living Family/patient expects to be discharged to:: Private residence Living Arrangements: Spouse/significant other Available Help at Discharge: Family Type of Home: House Home Access: Stairs to enter Entrance Stairs-Rails: Can reach both Entrance Stairs-Number of Steps: 3 Home Layout:  (split level home ) Home Equipment: None      Prior Function Level of Independence: Independent               Hand Dominance        Extremity/Trunk Assessment   Upper Extremity Assessment Upper Extremity Assessment: Generalized weakness    Lower Extremity Assessment Lower Extremity Assessment: Generalized weakness       Communication   Communication: No difficulties  Cognition Arousal/Alertness: Awake/alert Behavior During Therapy: WFL for tasks assessed/performed Overall Cognitive Status: Within Functional Limits for tasks assessed                      General Comments      Exercises  Assessment/Plan    PT Assessment Patient needs continued PT services  PT Problem List Decreased strength;Decreased activity tolerance;Decreased mobility          PT Treatment Interventions Gait training;Stair training;Functional mobility training;Therapeutic activities;Therapeutic exercise;Balance training;Patient/family education    PT Goals (Current goals can be found in the Care Plan section)  Acute Rehab PT Goals Patient Stated Goal: to go home  PT Goal Formulation:  With patient Time For Goal Achievement: 08/12/16 Potential to Achieve Goals: Good    Frequency Min 3X/week   Barriers to discharge        Co-evaluation               End of Session Equipment Utilized During Treatment: Gait belt;Oxygen Activity Tolerance: Patient tolerated treatment well Patient left: in bed;with call bell/phone within reach Nurse Communication: Other (comment);Mobility status (Mobility sheet left in pts room)    Functional Limitation: Mobility: Walking and moving around Mobility: Walking and Moving Around Current Status [phone]([id number]): At least 20 percent but less than 40 percent impaired, limited or restricted Mobility: Walking and Moving Around Goal Status [phone]([id number]): At least 20 percent but less than 40 percent impaired, limited or restricted    Time: [id number] PT Time Calculation (min) (ACUTE ONLY): 28 min   Charges:   PT Evaluation $PT Eval Low Complexity: 1 Procedure PT Treatments $Therapeutic Activity: 8-22 mins   PT G Codes:   PT G-Codes **NOT FOR INPATIENT CLASS** Functional Limitation: Mobility: Walking and moving around Mobility: Walking and Moving Around Current Status ([phone]): At least 20 percent but less than 40 percent impaired, limited or restricted Mobility: Walking and Moving Around Goal Status [phone]([id number]): At least 20 percent but less than 40 percent impaired, limited or restricted   4:51 PM,07/29/16 Marylyn IshiharaSara Kiser PT, DPT [facility] [phone]

## 2016-07-30 ENCOUNTER — Inpatient Hospital Stay (HOSPITAL_COMMUNITY): Payer: No Typology Code available for payment source

## 2016-07-30 LAB — BASIC METABOLIC PANEL
ANION GAP: 8 (ref 5–15)
BUN: 8 mg/dL (ref 6–20)
CALCIUM: 8.2 mg/dL — AB (ref 8.9–10.3)
CO2: 35 mmol/L — ABNORMAL HIGH (ref 22–32)
Chloride: 97 mmol/L — ABNORMAL LOW (ref 101–111)
Creatinine, Ser: 0.58 mg/dL (ref 0.44–1.00)
GFR calc Af Amer: >60 mL/min (ref >60)
Glucose, Bld: 104 mg/dL — ABNORMAL HIGH (ref 65–99)
Potassium: 3.1 mmol/L — ABNORMAL LOW (ref 3.5–5.1)
SODIUM: 140 mmol/L (ref 135–145)

## 2016-07-30 LAB — MAGNESIUM: MAGNESIUM: 1.7 mg/dL (ref 1.7–2.4)

## 2016-07-30 MED ORDER — POTASSIUM CHLORIDE CRYS ER 20 MEQ PO TBCR
40.0000 meq | EXTENDED_RELEASE_TABLET | Freq: Four times a day (QID) | ORAL | Status: AC
  Administered 2016-07-30 (×2): 40 meq via ORAL
  Filled 2016-07-30 (×2): qty 2

## 2016-07-30 MED ORDER — MAGNESIUM SULFATE 2 GM/50ML IV SOLN
2.0000 g | Freq: Once | INTRAVENOUS | Status: AC
  Administered 2016-07-30: 2 g via INTRAVENOUS
  Filled 2016-07-30: qty 50

## 2016-07-30 MED ORDER — METOPROLOL SUCCINATE ER 25 MG PO TB24
25.0000 mg | ORAL_TABLET | Freq: Two times a day (BID) | ORAL | Status: DC
  Administered 2016-07-30 – 2016-08-01 (×5): 25 mg via ORAL
  Filled 2016-07-30 (×5): qty 1

## 2016-07-30 NOTE — Progress Notes (Signed)
PROGRESS NOTE                                                                                                                                                                                                             Patient Demographics:    Kathleen Velazquez, is a 60 y.o. female, DOB - 12/17/1955, WUJ:[id number]  Admit date - 07/27/2016   Admitting Physician Standley Brooking, MD  Outpatient Primary MD for the patient is Toma Deiters, MD  LOS - 3  Chief Complaint  Patient presents with  . Weakness    cannot walk       Brief Narrative   60 year old woman PMH irritable bowel syndrome presents with worsening, profound diarrhea, poor oral intake. Found to have severe hypokalemia and hypomagnesemia and referred for admission.   Next morning when I saw her she had developed hyperkalemia along with CHF and required BiPAP overnight.   Subjective:    Kathleen Velazquez today has, No headache, No chest pain, No abdominal pain - No Nausea, No new weakness tingling or numbness, No Cough - ++ SOB.    Assessment  & Plan :    1.Diarrhea induced initial hypokalemia and hypomagnesemia with generalized weakness. All resolved with replacement.  2. Hyperkalemia. Due to potassium replacement, hyperkalemia protocol followed And potassium mildly low on 07/30/2016 which will be replaced. Magnesium now stable.  3. SVT secondary to dehydration. At times in sinus tachycardia, was hydrated but went into CHF, now on gentle beta blocker will monitor. Currently asymptomatic otherwise.  4. Acute hypoxic respiratory failure requiring BiPAP secondary to acute on chronic diastolic CHF EF 60%. Due to IV fluid replacement, IV fluids stopped, started on Lasix, Zaroxolyn and Nitropaste. Respiratory failure has resolved she is down to 2 L nasal cannula oxygen from BiPAP and comfortable.  5. Generalized weakness due to dehydration. Dehydration resolved,  initiated PT and activity.  6. IBS with chronic diarrhea. Placed on Imodium will monitor. C. difficile negative.    Family Communication  :  None  Code Status :  Full  Diet : Diet regular Room service appropriate? Yes; Fluid consistency: Thin   Disposition Plan  :  Telemetry  Consults  :  None  Procedures  :    TTE - Normal LV wall thickness with LVEF 55-60%. Indeterminate diastolic function. Trivial mitral regurgitation. Mildly  reduced right ventricular contraction. Mild tricuspid regurgitation with PASP 26 mmHg. Trivial pericardial effusion. Left pleural effusion noted.  DVT Prophylaxis  :  Lovenox    Lab Results  Component Value Date   PLT 152 07/28/2016    Inpatient Medications  Scheduled Meds: . aspirin  324 mg Oral Daily  . enoxaparin (LOVENOX) injection  40 mg Subcutaneous Q24H  . feeding supplement (ENSURE ENLIVE)  237 mL Oral BID BM  . magnesium sulfate 1 - 4 g bolus IVPB  2 g Intravenous Once  . metoprolol succinate  25 mg Oral BID  . potassium chloride  40 mEq Oral Q6H  . sodium chloride flush  3 mL Intravenous Q12H  . venlafaxine XR  75 mg Oral Q breakfast   Continuous Infusions: PRN Meds:.albuterol, ipratropium, loperamide, morphine injection, nitroGLYCERIN, ondansetron  Antibiotics  :    Anti-infectives    None         Objective:   Vitals:   07/30/16 0730 07/30/16 0739 07/30/16 0745 07/30/16 0800  BP: 98/70   113/85  Pulse: 99 (!) 104 (!) 104 (!) 111  Resp: 19 (!) 21 (!) 24 (!) 24  Temp:  98.9 F (37.2 C)    TempSrc:  Oral    SpO2: 97% 99% 96% 98%  Weight:      Height:        Wt Readings from Last 3 Encounters:  07/30/16 26.1 kg (57 lb 8 oz)  05/16/13 59.4 kg (131 lb)  04/27/12 58.2 kg (128 lb 6.4 oz)     Intake/Output Summary (Last 24 hours) at 07/30/16 0855 Last data filed at 07/30/16 [id number]  Gross per 24 hour  Intake              240 ml  Output             4151 ml  Net            -3911 ml     Physical Exam  Awake Alert,  Oriented X 3, No new F.N deficits, Normal affect Zimmerman.AT,PERRAL Supple Neck,No JVD, No cervical lymphadenopathy appriciated.  Symmetrical Chest wall movement, Good air movement bilaterally, few rales RRR,No Gallops,Rubs or new Murmurs, No Parasternal Heave +ve B.Sounds, Abd Soft, No tenderness, No organomegaly appriciated, No rebound - guarding or rigidity. No Cyanosis, Clubbing or edema, No new Rash or bruise       Data Review:    CBC  Recent Labs Lab 07/27/16 1053 07/28/16 0439  WBC 8.6 7.0  HGB 13.3 11.4*  HCT 38.3 32.8*  PLT 181 152  MCV 99.2 100.3*  MCH 34.5* 34.9*  MCHC 34.7 34.8  RDW 13.9 14.3  LYMPHSABS 2.0  --   MONOABS 0.7  --   EOSABS 0.0  --   BASOSABS 0.0  --     Chemistries   Recent Labs Lab 07/27/16 1053 07/27/16 1137 07/28/16 0439 07/28/16 1538 07/28/16 2056 07/29/16 0230 07/29/16 0658 07/29/16 0753 07/29/16 1044 07/29/16 1518 07/30/16 0510  NA 143  --  144 144 143 144  --   --   --   --  140  K <2.0*  --  <2.0* 3.1* 3.5 5.8* 7.4* 7.3* 4.5 3.7 3.1*  CL 100*  --  110 115* 111 115*  --   --   --   --  97*  CO2 33*  --  28 22 22 22   --   --   --   --  35*  GLUCOSE 115*  --  115* 131* 237* 169*  --   --   --   --  104*  BUN 8  --  6 5* <5* 5*  --   --   --   --  8  CREATININE 0.60  --  0.55 0.64 0.63 0.65  --   --   --   --  0.58  CALCIUM 8.8*  --  7.4* 7.4* 7.1* 7.2*  --   --   --   --  8.2*  MG  --  1.3* 2.0  --   --  2.2  --   --   --   --  1.7  AST 34  --   --   --   --   --   --   --   --   --   --   ALT 27  --   --   --   --   --   --   --   --   --   --   ALKPHOS 82  --   --   --   --   --   --   --   --   --   --   BILITOT 1.3*  --   --   --   --   --   --   --   --   --   --    ------------------------------------------------------------------------------------------------------------------ No results for input(s): CHOL, HDL, LDLCALC, TRIG, CHOLHDL, LDLDIRECT in the last 72 hours.  No results found for:  HGBA1C ------------------------------------------------------------------------------------------------------------------ No results for input(s): TSH, T4TOTAL, T3FREE, THYROIDAB in the last 72 hours.  Invalid input(s): FREET3 ------------------------------------------------------------------------------------------------------------------ No results for input(s): VITAMINB12, FOLATE, FERRITIN, TIBC, IRON, RETICCTPCT in the last 72 hours.  Coagulation profile No results for input(s): INR, PROTIME in the last 168 hours.  No results for input(s): DDIMER in the last 72 hours.  Cardiac Enzymes  Recent Labs Lab 07/29/16 0230 07/29/16 0800 07/29/16 1336  TROPONINI 0.04* 0.04* 0.04*   ------------------------------------------------------------------------------------------------------------------ No results found for: BNP  Micro Results Recent Results (from the past 240 hour(s))  MRSA PCR Screening     Status: None   Collection Time: 07/27/16  3:46 PM  Result Value Ref Range Status   MRSA by PCR NEGATIVE NEGATIVE Final    Comment:        The GeneXpert MRSA Assay (FDA approved for NASAL specimens only), is one component of a comprehensive MRSA colonization surveillance program. It is not intended to diagnose MRSA infection nor to guide or monitor treatment for MRSA infections.   C difficile quick scan w PCR reflex     Status: None   Collection Time: 07/27/16  6:58 PM  Result Value Ref Range Status   C Diff antigen NEGATIVE NEGATIVE Final   C Diff toxin NEGATIVE NEGATIVE Final   C Diff interpretation No C. difficile detected.  Final    Radiology Reports Ct Head Wo Contrast  Result Date: 07/27/2016 CLINICAL DATA:  Lower extremity weakness for 1 day EXAM: CT HEAD WITHOUT CONTRAST TECHNIQUE: Contiguous axial images were obtained from the base of the skull through the vertex without intravenous contrast. COMPARISON:  None. FINDINGS: Brain: No mass effect, midline shift, or  acute hemorrhage. Mild global atrophy. Minimal chronic ischemic changes in the periventricular white matter. Vascular: No hyperdense vessel or unexpected calcification. Skull: Normal. Negative for fracture or focal lesion. Sinuses/Orbits: No acute finding. Other: None. IMPRESSION: No acute intracranial pathology.  Mild  chronic changes. Electronically Signed   By: Jolaine Click M.D.   On: 07/27/2016 12:43   Ct Angio Chest Pe W Or Wo Contrast  Result Date: 07/29/2016 CLINICAL DATA:  Shortness of breath, weakness, dehydration. EXAM: CT ANGIOGRAPHY CHEST WITH CONTRAST TECHNIQUE: Multidetector CT imaging of the chest was performed using the standard protocol during bolus administration of intravenous contrast. Multiplanar CT image reconstructions and MIPs were obtained to evaluate the vascular anatomy. CONTRAST:  100 cc Isovue 370 intravenously. COMPARISON:  Chest radiograph 07/29/2016 FINDINGS: Cardiovascular: The heart is mildly enlarged. There is minimal pericardial thickening. There is moderate calcific atherosclerotic disease of the coronary arteries. Mediastinum/Nodes: No enlarged mediastinal, hilar, or axillary lymph nodes. Thyroid gland, and trachea demonstrate no significant findings. There is masslike thickening of the distal esophagus. Lungs/Pleura: There are moderate in size layering bilateral pleural effusions. Airspace consolidation in the dependent portions of the lungs likely represents atelectasis although infectious consolidation is also possible. Upper Abdomen: No acute abnormality. Musculoskeletal: No chest wall abnormality. No acute or significant osseous findings. Review of the MIP images confirms the above findings. IMPRESSION: Mildly enlarged heart with minimal pericardial thickening versus effusion. Moderately advanced calcific atherosclerotic disease of the coronary arteries. Moderate in size bilateral layering pleural effusions with bilateral dependent atelectasis versus airspace  consolidation. Masslike thickening of the distal esophagus, which may represent hiatal hernia containing stomach contents, however esophageal mass cannot be confidently excluded. Direct visualization is suggested. Electronically Signed   By: Ted Mcalpine M.D.   On: 07/29/2016 15:00   Dg Chest Port 1 View  Result Date: 07/30/2016 CLINICAL DATA:  Shortness of breath. EXAM: PORTABLE CHEST 1 VIEW COMPARISON:  07/29/2016 and chest CT 07/29/2016 FINDINGS: Lungs are adequately inflated demonstrate continued moderate bilateral perihilar opacification and bibasilar opacification. Findings likely due to congestive heart failure with interstitial edema and bilateral pleural effusions/atelectasis. Cardiomediastinal silhouette and remainder of the exam is unchanged. IMPRESSION: Persistent hazy bilateral perihilar and bibasilar opacification suggesting congestive heart failure with interstitial edema and bilateral effusions/ atelectasis. Electronically Signed   By: Elberta Fortis M.D.   On: 07/30/2016 08:34   Dg Chest Port 1 View  Result Date: 07/29/2016 CLINICAL DATA:  Shortness of Breath EXAM: PORTABLE CHEST 1 VIEW COMPARISON:  July 28, 2016 FINDINGS: There is persistence of interstitial pulmonary edema. There is extensive new alveolar opacity throughout both mid and lower lung zones, slightly more on the left than on the right. There are bilateral pleural effusions with cardiomegaly. There is pulmonary venous hypertension. No adenopathy evident. IMPRESSION: Progression of congestive heart failure, now or pleural effusions an apparent alveolar edema as well as persistent interstitial edema. A degree of superimposed pneumonia cannot be excluded radiographically. The cardiac silhouette is stable. Electronically Signed   By: Bretta Bang III M.D.   On: 07/29/2016 08:16   Dg Chest Port 1 View  Result Date: 07/28/2016 CLINICAL DATA:  Hypoxia. Chest pressure/ pain and shortness of breath. EXAM: PORTABLE  CHEST 1 VIEW COMPARISON:  05/20/2013 and 11/21/2011. FINDINGS: 1452 hour. The heart now appears mildly enlarged. There are lower lung volumes with diffuse interstitial prominence suspicious for mild pulmonary edema. Right infrahilar fullness is likely vascular. There is no confluent airspace opacity, pneumothorax or significant pleural effusion. IMPRESSION: Cardiomegaly with probable pulmonary edema, most consistent with congestive heart failure. Radiographic follow up recommended. Electronically Signed   By: Carey Bullocks M.D.   On: 07/28/2016 15:05    Time Spent in minutes  30   Susa Raring K M.D on 07/30/2016  at 8:55 AM  Between 7am to 7pm - Pager - [phone]  After 7pm go to [email or web address] - password [email or web address]  [facility] -  Office  [phone]

## 2016-07-31 LAB — BASIC METABOLIC PANEL
ANION GAP: 7 (ref 5–15)
BUN: 9 mg/dL (ref 6–20)
CHLORIDE: 93 mmol/L — AB (ref 101–111)
CO2: 36 mmol/L — AB (ref 22–32)
Calcium: 8.5 mg/dL — ABNORMAL LOW (ref 8.9–10.3)
Creatinine, Ser: 0.55 mg/dL (ref 0.44–1.00)
GFR calc non Af Amer: >60 mL/min (ref >60)
GLUCOSE: 102 mg/dL — AB (ref 65–99)
POTASSIUM: 4.3 mmol/L (ref 3.5–5.1)
Sodium: 136 mmol/L (ref 135–145)

## 2016-07-31 LAB — TSH: TSH: 5.267 u[IU]/mL — ABNORMAL HIGH (ref 0.350–4.500)

## 2016-07-31 MED ORDER — FUROSEMIDE 10 MG/ML IJ SOLN
40.0000 mg | Freq: Once | INTRAMUSCULAR | Status: AC
  Administered 2016-07-31: 40 mg via INTRAVENOUS
  Filled 2016-07-31: qty 4

## 2016-07-31 MED ORDER — ZOLPIDEM TARTRATE 5 MG PO TABS
2.5000 mg | ORAL_TABLET | Freq: Every evening | ORAL | Status: DC | PRN
  Administered 2016-07-31: 2.5 mg via ORAL
  Filled 2016-07-31: qty 1

## 2016-07-31 NOTE — Progress Notes (Signed)
Report called to 3rd floor RN Verlon AuLeslie.Pt assigned room 332. Pt transported via wheelchair

## 2016-07-31 NOTE — Progress Notes (Signed)
PROGRESS NOTE                                                                                                                                                                                                             Patient Demographics:    Kathleen Velazquez, is a 60 y.o. female, DOB - Jan 22, 1956, NWG:[id number]  Admit date - 07/27/2016   Admitting Physician Standley Brooking, MD  Outpatient Primary MD for the patient is Toma Deiters, MD  LOS - 4  Chief Complaint  Patient presents with  . Weakness    cannot walk       Brief Narrative   60 year old woman PMH irritable bowel syndrome presents with worsening, profound diarrhea, poor oral intake. Found to have severe hypokalemia and hypomagnesemia and referred for admission.   Next morning when I saw her she had developed hyperkalemia along with CHF and required BiPAP overnight.   Subjective:    Kathleen Velazquez today has, No headache, No chest pain, No abdominal pain - No Nausea, No new weakness tingling or numbness, No Cough - Improved SOB.    Assessment  & Plan :    1.Diarrhea induced initial hypokalemia and hypomagnesemia with generalized weakness. All resolved with replacement.  2. Hyperkalemia. Due to potassium replacement, hyperkalemia protocol followed And potassium mildly low on 07/30/2016 which will be replaced. Magnesium now stable.  3. SVT secondary to dehydration History of chronic sinus tachycardia for the last 5 years with heart rate in mid 120s. Has been hydrated continue low-dose beta blocker.  4. Acute hypoxic respiratory failure requiring BiPAP secondary to acute on chronic diastolic CHF EF 60%. Due to IV fluid replacement, IV fluids stopped, started on Lasix, Zaroxolyn and Nitropaste. Respiratory failure has resolved she is down to 2 L nasal cannula oxygen from BiPAP and comfortable. We will increase activity add flutter valve and try to titrate off  oxygen.  5. Generalized weakness due to dehydration. Dehydration resolved, initiated PT and activity.  6. IBS with chronic diarrhea. Placed on Imodium will monitor. C. difficile negative.    Family Communication  :  None  Code Status :  Full  Diet : Diet regular Room service appropriate? Yes; Fluid consistency: Thin   Disposition Plan  :  Med surge likely discharge home tomorrow  Consults  :  None  Procedures  :  TTE - Normal LV wall thickness with LVEF 55-60%. Indeterminate diastolic function. Trivial mitral regurgitation. Mildly reduced right ventricular contraction. Mild tricuspid regurgitation with PASP 26 mmHg. Trivial pericardial effusion. Left pleural effusion noted.  DVT Prophylaxis  :  Lovenox    Lab Results  Component Value Date   PLT 152 07/28/2016    Inpatient Medications  Scheduled Meds: . aspirin  324 mg Oral Daily  . enoxaparin (LOVENOX) injection  40 mg Subcutaneous Q24H  . feeding supplement (ENSURE ENLIVE)  237 mL Oral BID BM  . metoprolol succinate  25 mg Oral BID  . sodium chloride flush  3 mL Intravenous Q12H  . venlafaxine XR  75 mg Oral Q breakfast   Continuous Infusions: PRN Meds:.albuterol, ipratropium, loperamide, morphine injection, nitroGLYCERIN, ondansetron  Antibiotics  :    Anti-infectives    None         Objective:   Vitals:   07/31/16 0500 07/31/16 0730 07/31/16 0800 07/31/16 0831  BP:   124/89 124/89  Pulse:   100 (!) 108  Resp:   18   Temp:  98.2 F (36.8 C)    TempSrc:  Oral    SpO2:   95%   Weight: 56.1 kg (123 lb 10.9 oz)     Height:        Wt Readings from Last 3 Encounters:  07/31/16 56.1 kg (123 lb 10.9 oz)  05/16/13 59.4 kg (131 lb)  04/27/12 58.2 kg (128 lb 6.4 oz)     Intake/Output Summary (Last 24 hours) at 07/31/16 0904 Last data filed at 07/30/16 2211  Gross per 24 hour  Intake              530 ml  Output              250 ml  Net              280 ml     Physical Exam  Awake Alert,  Oriented X 3, No new F.N deficits, Normal affect [place].AT,PERRAL Supple Neck,No JVD, No cervical lymphadenopathy appriciated.  Symmetrical Chest wall movement, Good air movement bilaterally, few rales RRR,No Gallops,Rubs or new Murmurs, No Parasternal Heave +ve B.Sounds, Abd Soft, No tenderness, No organomegaly appriciated, No rebound - guarding or rigidity. No Cyanosis, Clubbing or edema, No new Rash or bruise       Data Review:    CBC  Recent Labs Lab 07/27/16 1053 07/28/16 0439  WBC 8.6 7.0  HGB 13.3 11.4*  HCT 38.3 32.8*  PLT 181 152  MCV 99.2 100.3*  MCH 34.5* 34.9*  MCHC 34.7 34.8  RDW 13.9 14.3  LYMPHSABS 2.0  --   MONOABS 0.7  --   EOSABS 0.0  --   BASOSABS 0.0  --     Chemistries   Recent Labs Lab 07/27/16 1053 07/27/16 1137 07/28/16 0439 07/28/16 1538 07/28/16 2056 07/29/16 0230  07/29/16 0753 07/29/16 1044 07/29/16 1518 07/30/16 0510 07/31/16 0536  NA 143  --  144 144 143 144  --   --   --   --  140 136  Velazquez <2.0*  --  <2.0* 3.1* 3.5 5.8*  < > 7.3* 4.5 3.7 3.1* 4.3  CL 100*  --  110 115* 111 115*  --   --   --   --  97* 93*  CO2 33*  --  28 22 22 22   --   --   --   --  35* 36*  GLUCOSE  115*  --  115* 131* 237* 169*  --   --   --   --  104* 102*  BUN 8  --  6 5* <5* 5*  --   --   --   --  8 9  CREATININE 0.60  --  0.55 0.64 0.63 0.65  --   --   --   --  0.58 0.55  CALCIUM 8.8*  --  7.4* 7.4* 7.1* 7.2*  --   --   --   --  8.2* 8.5*  MG  --  1.3* 2.0  --   --  2.2  --   --   --   --  1.7  --   AST 34  --   --   --   --   --   --   --   --   --   --   --   ALT 27  --   --   --   --   --   --   --   --   --   --   --   ALKPHOS 82  --   --   --   --   --   --   --   --   --   --   --   BILITOT 1.3*  --   --   --   --   --   --   --   --   --   --   --   < > = values in this interval not displayed. ------------------------------------------------------------------------------------------------------------------ No results for input(s): CHOL, HDL, LDLCALC,  TRIG, CHOLHDL, LDLDIRECT in the last 72 hours.  No results found for: HGBA1C ------------------------------------------------------------------------------------------------------------------ No results for input(s): TSH, T4TOTAL, T3FREE, THYROIDAB in the last 72 hours.  Invalid input(s): FREET3 ------------------------------------------------------------------------------------------------------------------ No results for input(s): VITAMINB12, FOLATE, FERRITIN, TIBC, IRON, RETICCTPCT in the last 72 hours.  Coagulation profile No results for input(s): INR, PROTIME in the last 168 hours.  No results for input(s): DDIMER in the last 72 hours.  Cardiac Enzymes  Recent Labs Lab 07/29/16 0230 07/29/16 0800 07/29/16 1336  TROPONINI 0.04* 0.04* 0.04*   ------------------------------------------------------------------------------------------------------------------ No results found for: BNP  Micro Results Recent Results (from the past 240 hour(s))  MRSA PCR Screening     Status: None   Collection Time: 07/27/16  3:46 PM  Result Value Ref Range Status   MRSA by PCR NEGATIVE NEGATIVE Final    Comment:        The GeneXpert MRSA Assay (FDA approved for NASAL specimens only), is one component of a comprehensive MRSA colonization surveillance program. It is not intended to diagnose MRSA infection nor to guide or monitor treatment for MRSA infections.   C difficile quick scan w PCR reflex     Status: None   Collection Time: 07/27/16  6:58 PM  Result Value Ref Range Status   C Diff antigen NEGATIVE NEGATIVE Final   C Diff toxin NEGATIVE NEGATIVE Final   C Diff interpretation No C. difficile detected.  Final    Radiology Reports Ct Head Wo Contrast  Result Date: 07/27/2016 CLINICAL DATA:  Lower extremity weakness for 1 day EXAM: CT HEAD WITHOUT CONTRAST TECHNIQUE: Contiguous axial images were obtained from the base of the skull through the vertex without intravenous contrast.  COMPARISON:  None. FINDINGS: Brain: No mass effect, midline shift, or acute hemorrhage. Mild global atrophy. Minimal chronic ischemic changes in the  periventricular white matter. Vascular: No hyperdense vessel or unexpected calcification. Skull: Normal. Negative for fracture or focal lesion. Sinuses/Orbits: No acute finding. Other: None. IMPRESSION: No acute intracranial pathology.  Mild chronic changes. Electronically Signed   By: Jolaine ClickArthur  Hoss M.D.   On: 07/27/2016 12:43   Ct Angio Chest Pe W Or Wo Contrast  Result Date: 07/29/2016 CLINICAL DATA:  Shortness of breath, weakness, dehydration. EXAM: CT ANGIOGRAPHY CHEST WITH CONTRAST TECHNIQUE: Multidetector CT imaging of the chest was performed using the standard protocol during bolus administration of intravenous contrast. Multiplanar CT image reconstructions and MIPs were obtained to evaluate the vascular anatomy. CONTRAST:  100 cc Isovue 370 intravenously. COMPARISON:  Chest radiograph 07/29/2016 FINDINGS: Cardiovascular: The heart is mildly enlarged. There is minimal pericardial thickening. There is moderate calcific atherosclerotic disease of the coronary arteries. Mediastinum/Nodes: No enlarged mediastinal, hilar, or axillary lymph nodes. Thyroid gland, and trachea demonstrate no significant findings. There is masslike thickening of the distal esophagus. Lungs/Pleura: There are moderate in size layering bilateral pleural effusions. Airspace consolidation in the dependent portions of the lungs likely represents atelectasis although infectious consolidation is also possible. Upper Abdomen: No acute abnormality. Musculoskeletal: No chest wall abnormality. No acute or significant osseous findings. Review of the MIP images confirms the above findings. IMPRESSION: Mildly enlarged heart with minimal pericardial thickening versus effusion. Moderately advanced calcific atherosclerotic disease of the coronary arteries. Moderate in size bilateral layering pleural  effusions with bilateral dependent atelectasis versus airspace consolidation. Masslike thickening of the distal esophagus, which may represent hiatal hernia containing stomach contents, however esophageal mass cannot be confidently excluded. Direct visualization is suggested. Electronically Signed   By: Ted Mcalpineobrinka  Dimitrova M.D.   On: 07/29/2016 15:00   Dg Chest Port 1 View  Result Date: 07/30/2016 CLINICAL DATA:  Shortness of breath. EXAM: PORTABLE CHEST 1 VIEW COMPARISON:  07/29/2016 and chest CT 07/29/2016 FINDINGS: Lungs are adequately inflated demonstrate continued moderate bilateral perihilar opacification and bibasilar opacification. Findings likely due to congestive heart failure with interstitial edema and bilateral pleural effusions/atelectasis. Cardiomediastinal silhouette and remainder of the exam is unchanged. IMPRESSION: Persistent hazy bilateral perihilar and bibasilar opacification suggesting congestive heart failure with interstitial edema and bilateral effusions/ atelectasis. Electronically Signed   By: Elberta Fortisaniel  Boyle M.D.   On: 07/30/2016 08:34   Dg Chest Port 1 View  Result Date: 07/29/2016 CLINICAL DATA:  Shortness of Breath EXAM: PORTABLE CHEST 1 VIEW COMPARISON:  July 28, 2016 FINDINGS: There is persistence of interstitial pulmonary edema. There is extensive new alveolar opacity throughout both mid and lower lung zones, slightly more on the left than on the right. There are bilateral pleural effusions with cardiomegaly. There is pulmonary venous hypertension. No adenopathy evident. IMPRESSION: Progression of congestive heart failure, now or pleural effusions an apparent alveolar edema as well as persistent interstitial edema. A degree of superimposed pneumonia cannot be excluded radiographically. The cardiac silhouette is stable. Electronically Signed   By: Bretta BangWilliam  Woodruff III M.D.   On: 07/29/2016 08:16   Dg Chest Port 1 View  Result Date: 07/28/2016 CLINICAL DATA:   Hypoxia. Chest pressure/ pain and shortness of breath. EXAM: PORTABLE CHEST 1 VIEW COMPARISON:  05/20/2013 and 11/21/2011. FINDINGS: 1452 hour. The heart now appears mildly enlarged. There are lower lung volumes with diffuse interstitial prominence suspicious for mild pulmonary edema. Right infrahilar fullness is likely vascular. There is no confluent airspace opacity, pneumothorax or significant pleural effusion. IMPRESSION: Cardiomegaly with probable pulmonary edema, most consistent with congestive heart failure. Radiographic follow up  recommended. Electronically Signed   By: Carey Bullocks M.D.   On: 07/28/2016 15:05    Time Spent in minutes  30   Kathleen Velazquez M.D on 07/31/2016 at 9:04 AM  Between 7am to 7pm - Pager - [phone]  After 7pm go to [email or web address] - password [email or web address]  [facility] -  Office  [phone]

## 2016-08-01 LAB — BASIC METABOLIC PANEL
Anion gap: 10 (ref 5–15)
BUN: 16 mg/dL (ref 6–20)
CHLORIDE: 92 mmol/L — AB (ref 101–111)
CO2: 34 mmol/L — ABNORMAL HIGH (ref 22–32)
CREATININE: 0.71 mg/dL (ref 0.44–1.00)
Calcium: 8.5 mg/dL — ABNORMAL LOW (ref 8.9–10.3)
Glucose, Bld: 110 mg/dL — ABNORMAL HIGH (ref 65–99)
Potassium: 3.9 mmol/L (ref 3.5–5.1)
SODIUM: 136 mmol/L (ref 135–145)

## 2016-08-01 LAB — MAGNESIUM: MAGNESIUM: 1.5 mg/dL — AB (ref 1.7–2.4)

## 2016-08-01 MED ORDER — ALBUTEROL SULFATE (2.5 MG/3ML) 0.083% IN NEBU: 2.5000 mg | INHALATION_SOLUTION | RESPIRATORY_TRACT | 0 refills | Status: DC | PRN

## 2016-08-01 MED ORDER — ASPIRIN EC 81 MG PO TBEC: 81.0000 mg | DELAYED_RELEASE_TABLET | Freq: Every day | ORAL | 0 refills | Status: DC

## 2016-08-01 MED ORDER — POTASSIUM CHLORIDE ER 10 MEQ PO TBCR: 10.0000 meq | EXTENDED_RELEASE_TABLET | Freq: Every day | ORAL | 0 refills | Status: DC

## 2016-08-01 MED ORDER — FUROSEMIDE 40 MG PO TABS: 40.0000 mg | ORAL_TABLET | Freq: Every day | ORAL | 0 refills | Status: DC

## 2016-08-01 MED ORDER — LOPERAMIDE HCL 2 MG PO CAPS: 4.0000 mg | ORAL_CAPSULE | ORAL | 0 refills | Status: DC | PRN

## 2016-08-01 MED ORDER — MAGNESIUM SULFATE 2 GM/50ML IV SOLN
2.0000 g | Freq: Once | INTRAVENOUS | Status: AC
  Administered 2016-08-01: 2 g via INTRAVENOUS

## 2016-08-01 MED ORDER — MAGNESIUM OXIDE 400 (241.3 MG) MG PO TABS
400.0000 mg | ORAL_TABLET | Freq: Every day | ORAL | 0 refills | Status: DC
Start: 2016-08-01 — End: 2020-02-26

## 2016-08-01 NOTE — Progress Notes (Signed)
Mrs. Kathleen Velazquez refused home health services, but accepted nebulized of which was delivered to her room prior to discharge.

## 2016-08-01 NOTE — Discharge Summary (Addendum)
Kathleen Velazquez ONG:[id number]:[id number] DOB: [birth year] DOA: 07/27/2016  PCP: [name] A HASANAJ, MD  Admit date: 07/27/2016  Discharge date: 08/01/2016  Admitted From: Home  Disposition:  Home   Recommendations for Outpatient Follow-up:   Follow up with PCP in 1-2 weeks  PCP Please obtain BMP/CBC, 2 view CXR in 1week,  (see Discharge instructions)   PCP Please follow up on the following pending results: None   Home Health: HHPT-RN   Equipment/Devices: Walker  Consultations: None Discharge Condition: Fair   CODE STATUS: Full   Diet Recommendation:  Heart Healthy with 1.5 L total fluid restriction daily   Chief Complaint  Patient presents with  . Weakness    cannot walk     Brief history of present illness from the day of admission and additional interim summary    60 year old woman PMH irritable bowel syndrome presents with worsening, profound diarrhea, poor oral intake. Found to have severe hypokalemia and hypomagnesemia and referred for admission. Complicated by acute on chronic diastolic CHF with acute hypoxic respiratory failure which has resolved now.      Hospital issues addressed    1.Diarrhea induced initial hypokalemia and hypomagnesemia with generalized weakness. All resolved with replacement.She will be placed on magnesium and potassium upon discharge as well as she is on Lasix. Magnesium was replaced again this morning as it was borderline low.  2. Hyperkalemia. Due to potassium replacement, hyperkalemia protocol followed And potassium mildly low on 07/30/2016 which will be replaced. Magnesium now stable.  3. SVT secondary to dehydration History of chronic sinus tachycardia for the last 5 years with heart rate in mid 120s. Has been hydrated continue low-dose beta blocker.  4. Acute hypoxic  respiratory failure requiring BiPAP secondary to acute on chronic diastolic CHF EF 60%. Due to IV fluid replacement, IV fluids stopped, she initially required BiPAP and then oxygen, was diuresed with IV Lasix with good effect, now lungs are clear, she is on room air ambulating in the hallway without any symptoms or oxygen need, will be discharged on Lasix and potassium supplementation request PCP to monitor weight and BMP closely.  5. Generalized weakness due to dehydration. Dehydration resolved, initiated PT and activity. We will qualify for home PT and RN along with a walker.  6. IBS with chronic diarrhea. Placed on Imodium will monitor. C. difficile negative.   Discharge diagnosis     Principal Problem:   Hypokalemia Active Problems:   Generalized weakness   Hypomagnesemia   SVT (supraventricular tachycardia) (HCC)   Diarrhea   IBS (irritable bowel syndrome)    Discharge instructions    Discharge Instructions    Diet - low sodium heart healthy    Complete by:  As directed    Discharge instructions    Complete by:  As directed    You need an EGD within 1 - 2 weeks  Follow with Primary MD [name] A HASANAJ, MD in 7 days   Get CBC, CMP, 2 view Chest X ray checked  by Primary MD or SNF  MD in 5-7 days ( we routinely change or add medications that can affect your baseline labs and fluid status, therefore we recommend that you get the mentioned basic workup next visit with your PCP, your PCP may decide not to get them or add new tests based on their clinical decision)   Activity: As tolerated with Full fall precautions use walker/cane & assistance as needed   Disposition Home     Diet:  Heart Healthy  Check your Weight same time everyday, if you gain over 2 pounds, or you develop in leg swelling, experience more shortness of breath or chest pain, call your Primary MD immediately. Follow Cardiac Low Salt Diet and 1.5 lit/day fluid restriction.   On your next visit with your  primary care physician please Get Medicines reviewed and adjusted.   Please request your Prim.MD to go over all Hospital Tests and Procedure/Radiological results at the follow up, please get all Hospital records sent to your Prim MD by signing hospital release before you go home.   If you experience worsening of your admission symptoms, develop shortness of breath, life threatening emergency, suicidal or homicidal thoughts you must seek medical attention immediately by calling 911 or calling your MD immediately  if symptoms less severe.  You Must read complete instructions/literature along with all the possible adverse reactions/side effects for all the Medicines you take and that have been prescribed to you. Take any new Medicines after you have completely understood and accpet all the possible adverse reactions/side effects.   Do not drive, operate heavy machinery, perform activities at heights, swimming or participation in water activities or provide baby sitting services if your were admitted for syncope or siezures until you have seen by Primary MD or a Neurologist and advised to do so again.  Do not drive when taking Pain medications.    Do not take more than prescribed Pain, Sleep and Anxiety Medications  Special Instructions: If you have smoked or chewed Tobacco  in the last 2 yrs please stop smoking, stop any regular Alcohol  and or any Recreational drug use.  Wear Seat belts while driving.   Please note  You were cared for by a hospitalist during your hospital stay. If you have any questions about your discharge medications or the care you received while you were in the hospital after you are discharged, you can call the unit and asked to speak with the hospitalist on call if the hospitalist that took care of you is not available. Once you are discharged, your primary care physician will handle any further medical issues. Please note that NO REFILLS for any discharge medications  will be authorized once you are discharged, as it is imperative that you return to your primary care physician (or establish a relationship with a primary care physician if you do not have one) for your aftercare needs so that they can reassess your need for medications and monitor your lab values.   Increase activity slowly    Complete by:  As directed       Discharge Medications   Allergies as of 08/01/2016      Reactions   Codeine Nausea And Vomiting      Medication List    TAKE these medications   albuterol (2.5 MG/3ML) 0.083% nebulizer solution Commonly known as:  PROVENTIL Take 3 mLs (2.5 mg total) by nebulization every 4 (four) hours as needed for wheezing or shortness of breath.   aspirin EC 81 MG tablet Take 1  tablet (81 mg total) by mouth daily.   desvenlafaxine 50 MG 24 hr tablet Commonly known as:  PRISTIQ Take 50 mg by mouth every morning.   furosemide 40 MG tablet Commonly known as:  LASIX Take 1 tablet (40 mg total) by mouth daily.   loperamide 2 MG capsule Commonly known as:  IMODIUM Take 2 capsules (4 mg total) by mouth every 4 (four) hours as needed for diarrhea or loose stools.   metoprolol succinate 25 MG 24 hr tablet Commonly known as:  TOPROL-XL Take 25 mg by mouth 2 (two) times daily.   potassium chloride 10 MEQ tablet Commonly known as:  K-DUR Take 1 tablet (10 mEq total) by mouth daily.   SUMAtriptan 100 MG tablet Commonly known as:  IMITREX Take 100 mg by mouth every 2 (two) hours as needed for migraine. May repeat in 2 hours if headache persists or recurs.            Durable Medical Equipment        Start     Ordered   07/28/16 1435  For home use only DME Walker rolling  Once    Question:  Patient needs a walker to treat with the following condition  Answer:  Arthritis   07/28/16 1435      Follow-up Information    [name] A HASANAJ, MD. Schedule an appointment as soon as possible for a visit in 1 week(s).   Specialty:  Internal  Medicine Why:  review CT results Contact information: [address] [place] [id number] [phone]        Lionel DecemberNajeeb Rehman, MD. Schedule an appointment as soon as possible for a visit in 1 week(s).   Specialty:  Gastroenterology Why:  EGD Contact information: [address], [address] [place] [address] [phone]        Dina RichBranch, Jonathan, MD. Schedule an appointment as soon as possible for a visit in 1 week(s).   Specialty:  Cardiology Why:  CHF Contact information: [address] [place] [address] [phone]           Major procedures and Radiology Reports - PLEASE review detailed and final reports thoroughly  -      TTE - Normal LV wall thickness with LVEF 55-60%. Indeterminatediastolic function. Trivial mitral regurgitation. Mildly reducedright ventricular contraction. Mild tricuspid regurgitation withPASP 26 mmHg. Trivial pericardial effusion. Left pleural effusionnoted.    Ct Head Wo Contrast  Result Date: 07/27/2016 CLINICAL DATA:  Lower extremity weakness for 1 day EXAM: CT HEAD WITHOUT CONTRAST TECHNIQUE: Contiguous axial images were obtained from the base of the skull through the vertex without intravenous contrast. COMPARISON:  None. FINDINGS: Brain: No mass effect, midline shift, or acute hemorrhage. Mild global atrophy. Minimal chronic ischemic changes in the periventricular white matter. Vascular: No hyperdense vessel or unexpected calcification. Skull: Normal. Negative for fracture or focal lesion. Sinuses/Orbits: No acute finding. Other: None. IMPRESSION: No acute intracranial pathology.  Mild chronic changes. Electronically Signed   By: Jolaine ClickArthur  Hoss M.D.   On: 07/27/2016 12:43   Ct Angio Chest Pe W Or Wo Contrast  Result Date: 07/29/2016 CLINICAL DATA:  Shortness of breath, weakness, dehydration. EXAM: CT ANGIOGRAPHY CHEST WITH CONTRAST TECHNIQUE: Multidetector CT imaging of the chest was performed using the standard protocol during  bolus administration of intravenous contrast. Multiplanar CT image reconstructions and MIPs were obtained to evaluate the vascular anatomy. CONTRAST:  100 cc Isovue 370 intravenously. COMPARISON:  Chest radiograph 07/29/2016 FINDINGS: Cardiovascular:  The heart is mildly enlarged. There is minimal pericardial thickening. There is moderate calcific atherosclerotic disease of the coronary arteries. Mediastinum/Nodes: No enlarged mediastinal, hilar, or axillary lymph nodes. Thyroid gland, and trachea demonstrate no significant findings. There is masslike thickening of the distal esophagus. Lungs/Pleura: There are moderate in size layering bilateral pleural effusions. Airspace consolidation in the dependent portions of the lungs likely represents atelectasis although infectious consolidation is also possible. Upper Abdomen: No acute abnormality. Musculoskeletal: No chest wall abnormality. No acute or significant osseous findings. Review of the MIP images confirms the above findings. IMPRESSION: Mildly enlarged heart with minimal pericardial thickening versus effusion. Moderately advanced calcific atherosclerotic disease of the coronary arteries. Moderate in size bilateral layering pleural effusions with bilateral dependent atelectasis versus airspace consolidation. Masslike thickening of the distal esophagus, which may represent hiatal hernia containing stomach contents, however esophageal mass cannot be confidently excluded. Direct visualization is suggested. Electronically Signed   By: Ted Mcalpine M.D.   On: 07/29/2016 15:00   Dg Chest Port 1 View  Result Date: 07/30/2016 CLINICAL DATA:  Shortness of breath. EXAM: PORTABLE CHEST 1 VIEW COMPARISON:  07/29/2016 and chest CT 07/29/2016 FINDINGS: Lungs are adequately inflated demonstrate continued moderate bilateral perihilar opacification and bibasilar opacification. Findings likely due to congestive heart failure with interstitial edema and bilateral pleural  effusions/atelectasis. Cardiomediastinal silhouette and remainder of the exam is unchanged. IMPRESSION: Persistent hazy bilateral perihilar and bibasilar opacification suggesting congestive heart failure with interstitial edema and bilateral effusions/ atelectasis. Electronically Signed   By: Elberta Fortis M.D.   On: 07/30/2016 08:34   Dg Chest Port 1 View  Result Date: 07/29/2016 CLINICAL DATA:  Shortness of Breath EXAM: PORTABLE CHEST 1 VIEW COMPARISON:  July 28, 2016 FINDINGS: There is persistence of interstitial pulmonary edema. There is extensive new alveolar opacity throughout both mid and lower lung zones, slightly more on the left than on the right. There are bilateral pleural effusions with cardiomegaly. There is pulmonary venous hypertension. No adenopathy evident. IMPRESSION: Progression of congestive heart failure, now or pleural effusions an apparent alveolar edema as well as persistent interstitial edema. A degree of superimposed pneumonia cannot be excluded radiographically. The cardiac silhouette is stable. Electronically Signed   By: Bretta Bang III M.D.   On: 07/29/2016 08:16   Dg Chest Port 1 View  Result Date: 07/28/2016 CLINICAL DATA:  Hypoxia. Chest pressure/ pain and shortness of breath. EXAM: PORTABLE CHEST 1 VIEW COMPARISON:  05/20/2013 and 11/21/2011. FINDINGS: 1452 hour. The heart now appears mildly enlarged. There are lower lung volumes with diffuse interstitial prominence suspicious for mild pulmonary edema. Right infrahilar fullness is likely vascular. There is no confluent airspace opacity, pneumothorax or significant pleural effusion. IMPRESSION: Cardiomegaly with probable pulmonary edema, most consistent with congestive heart failure. Radiographic follow up recommended. Electronically Signed   By: Carey Bullocks M.D.   On: 07/28/2016 15:05    Micro Results     Recent Results (from the past 240 hour(s))  MRSA PCR Screening     Status: None   Collection  Time: 07/27/16  3:46 PM  Result Value Ref Range Status   MRSA by PCR NEGATIVE NEGATIVE Final    Comment:        The GeneXpert MRSA Assay (FDA approved for NASAL specimens only), is one component of a comprehensive MRSA colonization surveillance program. It is not intended to diagnose MRSA infection nor to guide or monitor treatment for MRSA infections.   C difficile quick scan w PCR reflex  Status: None   Collection Time: 07/27/16  6:58 PM  Result Value Ref Range Status   C Diff antigen NEGATIVE NEGATIVE Final   C Diff toxin NEGATIVE NEGATIVE Final   C Diff interpretation No C. difficile detected.  Final    Today   Subjective    Kathleen Velazquez today has no headache,no chest abdominal pain,no new weakness tingling or numbness, feels much better wants to go home today.     Objective   Blood pressure 112/69, pulse 95, temperature 98.6 F (37 C), temperature source Oral, resp. rate 16, height 5\' 2"  (1.575 m), weight 56.1 kg (123 lb 10.9 oz), SpO2 94 %.   Intake/Output Summary (Last 24 hours) at 08/01/16 0906 Last data filed at 08/01/16 0636  Gross per 24 hour  Intake              240 ml  Output             1200 ml  Net             -960 ml    Exam Awake Alert, Oriented x 3, No new F.N deficits, Normal affect [place].AT,PERRAL Supple Neck,No JVD, No cervical lymphadenopathy appriciated.  Symmetrical Chest wall movement, Good air movement bilaterally, CTAB RRR,No Gallops,Rubs or new Murmurs, No Parasternal Heave +ve B.Sounds, Abd Soft, Non tender, No organomegaly appriciated, No rebound -guarding or rigidity. No Cyanosis, Clubbing or edema, No new Rash or bruise   Data Review   CBC w Diff: Lab Results  Component Value Date   WBC 7.0 07/28/2016   HGB 11.4 (L) 07/28/2016   HCT 32.8 (L) 07/28/2016   PLT 152 07/28/2016   LYMPHOPCT 23 07/27/2016   MONOPCT 8 07/27/2016   EOSPCT 0 07/27/2016   BASOPCT 0 07/27/2016    CMP: Lab Results  Component Value Date   NA 136  08/01/2016   K 3.9 08/01/2016   CL 92 (L) 08/01/2016   CO2 34 (H) 08/01/2016   BUN 16 08/01/2016   CREATININE 0.71 08/01/2016   PROT 5.9 (L) 07/27/2016   ALBUMIN 3.3 (L) 07/27/2016   BILITOT 1.3 (H) 07/27/2016   ALKPHOS 82 07/27/2016   AST 34 07/27/2016   ALT 27 07/27/2016  .   Total Time in preparing paper work, data evaluation and todays exam - 35 minutes  Leroy Sea M.D on 08/01/2016 at 9:06 AM  [facility]   Office  [phone]

## 2016-08-01 NOTE — Progress Notes (Signed)
IV removed, WNL. Discharge instructions given to pt, verbalized understanding. Pt awaiting husband to come take her home.

## 2016-08-01 NOTE — Discharge Instructions (Signed)
You need an EGD within 1 - 2 weeks  Follow with Primary MD Kathleen DeitersXAJE A HASANAJ, MD in 7 days   Get CBC, CMP, 2 view Chest X ray checked  by Primary MD or SNF MD in 5-7 days ( we routinely change or add medications that can affect your baseline labs and fluid status, therefore we recommend that you get the mentioned basic workup next visit with your PCP, your PCP may decide not to get them or add new tests based on their clinical decision)   Activity: As tolerated with Full fall precautions use walker/cane & assistance as needed   Disposition Home     Diet:  Heart Healthy  Check your Weight same time everyday, if you gain over 2 pounds, or you develop in leg swelling, experience more shortness of breath or chest pain, call your Primary MD immediately. Follow Cardiac Low Salt Diet and 1.5 lit/day fluid restriction.   On your next visit with your primary care physician please Get Medicines reviewed and adjusted.   Please request your Prim.MD to go over all Hospital Tests and Procedure/Radiological results at the follow up, please get all Hospital records sent to your Prim MD by signing hospital release before you go home.   If you experience worsening of your admission symptoms, develop shortness of breath, life threatening emergency, suicidal or homicidal thoughts you must seek medical attention immediately by calling 911 or calling your MD immediately  if symptoms less severe.  You Must read complete instructions/literature along with all the possible adverse reactions/side effects for all the Medicines you take and that have been prescribed to you. Take any new Medicines after you have completely understood and accpet all the possible adverse reactions/side effects.   Do not drive, operate heavy machinery, perform activities at heights, swimming or participation in water activities or provide baby sitting services if your were admitted for syncope or siezures until you have seen by Primary MD or  a Neurologist and advised to do so again.  Do not drive when taking Pain medications.    Do not take more than prescribed Pain, Sleep and Anxiety Medications  Special Instructions: If you have smoked or chewed Tobacco  in the last 2 yrs please stop smoking, stop any regular Alcohol  and or any Recreational drug use.  Wear Seat belts while driving.   Please note  You were cared for by a hospitalist during your hospital stay. If you have any questions about your discharge medications or the care you received while you were in the hospital after you are discharged, you can call the unit and asked to speak with the hospitalist on call if the hospitalist that took care of you is not available. Once you are discharged, your primary care physician will handle any further medical issues. Please note that NO REFILLS for any discharge medications will be authorized once you are discharged, as it is imperative that you return to your primary care physician (or establish a relationship with a primary care physician if you do not have one) for your aftercare needs so that they can reassess your need for medications and monitor your lab values.

## 2016-08-10 ENCOUNTER — Encounter (INDEPENDENT_AMBULATORY_CARE_PROVIDER_SITE_OTHER): Payer: Self-pay | Admitting: Internal Medicine

## 2016-08-10 ENCOUNTER — Ambulatory Visit (INDEPENDENT_AMBULATORY_CARE_PROVIDER_SITE_OTHER): Payer: Managed Care, Other (non HMO) | Admitting: Internal Medicine

## 2016-08-10 ENCOUNTER — Encounter (INDEPENDENT_AMBULATORY_CARE_PROVIDER_SITE_OTHER): Payer: Self-pay | Admitting: *Deleted

## 2016-08-10 ENCOUNTER — Other Ambulatory Visit (INDEPENDENT_AMBULATORY_CARE_PROVIDER_SITE_OTHER): Payer: Self-pay | Admitting: Internal Medicine

## 2016-08-10 VITALS — BP 120/80 | HR 140 | Temp 98.0°F | Ht 62.0 in | Wt 113.7 lb

## 2016-08-10 DIAGNOSIS — R933 Abnormal findings on diagnostic imaging of other parts of digestive tract: Secondary | ICD-10-CM | POA: Diagnosis not present

## 2016-08-10 DIAGNOSIS — R935 Abnormal findings on diagnostic imaging of other abdominal regions, including retroperitoneum: Secondary | ICD-10-CM

## 2016-08-10 NOTE — Progress Notes (Signed)
Subjective:    Patient ID: Kathleen Velazquez, female    DOB: Sep 11, 1955, 61 y.o.   MRN: [id number]  HPI Here today for f/u after recent admission to [facility] for profound diarrhea, poor oral intake.  Hx of chronic IBS. Had not been on any recent antibiotics. No prior hx of C. Diff.  Two days before admission, she had stayed in bed, unable to walk due to generalized weakness.  Potassium less than 2.  She was given Potassium infusion per records.  No fever, sorethroat, n/v/abdominal .  Admitted x 5 days.  07/27/2016 C diff negative. She tells me she had diarrhea. She is having about 4 loose stools a day which is normal for her.    Her appetite is okay. She is eating good. No nausea or vomiting.  She denies any acid reflux. She is taking once a day.   EGD in 2011 revealed small sliding hiatal hernia without evidence of erosive esophagitis. Erosive antral gastritis with prepyloric scar.   07/29/2016 CT angio chest PE w or WO CM: Mildly enlarged heart with minimal pericardial thickening versus effusion.  Moderately advanced calcific atherosclerotic disease of the coronary arteries.  Moderate in size bilateral layering pleural effusions with bilateral dependent atelectasis versus airspace consolidation.  Masslike thickening of the distal esophagus, which may represent hiatal hernia containing stomach contents, however esophageal mass cannot be confidently excluded. Direct visualization is suggested.   CBC    Component Value Date/Time   WBC 7.0 07/28/2016 0439   RBC 3.27 (L) 07/28/2016 0439   HGB 11.4 (L) 07/28/2016 0439   HCT 32.8 (L) 07/28/2016 0439   PLT 152 07/28/2016 0439   MCV 100.3 (H) 07/28/2016 0439   MCH 34.9 (H) 07/28/2016 0439   MCHC 34.8 07/28/2016 0439   RDW 14.3 07/28/2016 0439   LYMPHSABS 2.0 07/27/2016 1053   MONOABS 0.7 07/27/2016 1053   EOSABS 0.0 07/27/2016 1053   BASOSABS 0.0 07/27/2016 1053      Review of Systems Past Medical History:  Diagnosis Date   . Anxiety   . Arthritis    knees,   . Ejection fraction    Normal, echo, June, 2013  . GERD (gastroesophageal reflux disease)   . Headache(784.0)    hx of migraines   . Irritable bowel syndrome (IBS)   . PONV (postoperative nausea and vomiting)   . Sinus tachycardia    Persistent sinus tachycardia, June, 2013 ( prior monitor in 2007 had shown no significant abnormalities.)    Past Surgical History:  Procedure Laterality Date  . CHOLECYSTECTOMY    . HEMORRHOID SURGERY N/A 05/20/2013   Procedure: EXTENSIVE HEMORRHOIDECTOMY;  Surgeon: Dalia Heading, MD;  Location: [facility];  Service: General;  Laterality: N/A;  . JOINT REPLACEMENT     right knee replacement   . OTHER SURGICAL HISTORY     left breast biopsy - benign   . OTHER SURGICAL HISTORY     hx of skin cancer surgery   . TOTAL KNEE ARTHROPLASTY  11/28/2011   Procedure: TOTAL KNEE ARTHROPLASTY;  Surgeon: Loanne Drilling, MD;  Location: [facility];  Service: Orthopedics;  Laterality: Left;  . TUBAL LIGATION      Allergies  Allergen Reactions  . Codeine Nausea And Vomiting    Current Outpatient Prescriptions on File Prior to Visit  Medication Sig Dispense Refill  . albuterol (PROVENTIL) (2.5 MG/3ML) 0.083% nebulizer solution Take 3 mLs (2.5 mg total) by nebulization every 4 (four) hours as needed for wheezing or  shortness of breath. 75 mL 0  . aspirin EC 81 MG tablet Take 1 tablet (81 mg total) by mouth daily. 30 tablet 0  . desvenlafaxine (PRISTIQ) 50 MG 24 hr tablet Take 50 mg by mouth every morning.    . loperamide (IMODIUM) 2 MG capsule Take 2 capsules (4 mg total) by mouth every 4 (four) hours as needed for diarrhea or loose stools. 30 capsule 0  . magnesium oxide (MAG-OX) 400 (241.3 Mg) MG tablet Take 1 tablet (400 mg total) by mouth daily. 30 tablet 0  . metoprolol succinate (TOPROL-XL) 25 MG 24 hr tablet Take 25 mg by mouth 2 (two) times daily.    . potassium chloride (K-DUR) 10 MEQ tablet Take 1 tablet (10 mEq total) by  mouth daily. 30 tablet 0  . SUMAtriptan (IMITREX) 100 MG tablet Take 100 mg by mouth every 2 (two) hours as needed for migraine. May repeat in 2 hours if headache persists or recurs.     No current facility-administered medications on file prior to visit.        Objective:   Physical Exam Blood pressure 120/80, pulse (!) 140, temperature 98 F (36.7 C), height 5\' 2"  (1.575 m), weight 113 lb 11.2 oz (51.6 kg).  Alert and oriented. Skin warm and dry. Oral mucosa is moist.   . Sclera anicteric, conjunctivae is pink. Thyroid not enlarged. No cervical lymphadenopathy. Lungs clear. Heart regular rate and rhythm.  Abdomen is soft. Bowel sounds are positive. No hepatomegaly. No abdominal masses felt. No tenderness.  No edema to lower extremities.  .       Assessment & Plan:  Diarrhea. Symptoms have improved. She is having 4 stools a day.  Abnormal CT chest.EGD to rule esophageal mass/hernia.  I discussed with Dr. Karilyn Cotaehman Take Imodium one in am and one in pm.

## 2016-08-10 NOTE — Patient Instructions (Addendum)
Imodium one in am and one in pm.  EGD. I discussed with Dr. Karilyn Cotaehman.

## 2016-08-13 NOTE — ED Provider Notes (Signed)
[facility] Provider Note   CSN: [id number] Arrival date & time: 07/27/16  4098     History   Chief Complaint Chief Complaint  Patient presents with  . Weakness    cannot walk    HPI Kathleen Velazquez is a 61 y.o. female with past medical history as outlined below including chronic diarrhea associated with her IBS presenting with slowly progressive generalized weakness, now with the inability to walk without assistance since yesterday.  She denies fevers, chills, nausea, vomiting, chest pain, palpitations or shortness of breath, also denies focal weakness, headache, numbness or tingling in extremities or difficulty with speech. She denies prior history of similar symptoms.  She has found no alleviators.  The history is provided by the patient and a relative.    Past Medical History:  Diagnosis Date  . Anxiety   . Arthritis    knees,   . Ejection fraction    Normal, echo, June, 2013  . GERD (gastroesophageal reflux disease)   . Headache(784.0)    hx of migraines   . Irritable bowel syndrome (IBS)   . PONV (postoperative nausea and vomiting)   . Sinus tachycardia    Persistent sinus tachycardia, June, 2013 ( prior monitor in 2007 had shown no significant abnormalities.)    Patient Active Problem List   Diagnosis Date Noted  . Abnormal CT of the abdomen 08/10/2016  . Hypokalemia 07/27/2016  . Generalized weakness 07/27/2016  . Hypomagnesemia 07/27/2016  . SVT (supraventricular tachycardia) (HCC) 07/27/2016  . Diarrhea 07/27/2016  . IBS (irritable bowel syndrome) 07/27/2016  . GERD (gastroesophageal reflux disease)   . Headache(784.0)   . Arthritis   . Anxiety   . Ejection fraction   . Sinus tachycardia   . Palpitations 01/16/2012  . Postop Acute blood loss anemia 11/29/2011  . OA (osteoarthritis) of knee 11/28/2011    Past Surgical History:  Procedure Laterality Date  . CHOLECYSTECTOMY    . HEMORRHOID SURGERY N/A 05/20/2013   Procedure: EXTENSIVE  HEMORRHOIDECTOMY;  Surgeon: Dalia Heading, MD;  Location: [facility];  Service: General;  Laterality: N/A;  . JOINT REPLACEMENT     right knee replacement   . OTHER SURGICAL HISTORY     left breast biopsy - benign   . OTHER SURGICAL HISTORY     hx of skin cancer surgery   . TOTAL KNEE ARTHROPLASTY  11/28/2011   Procedure: TOTAL KNEE ARTHROPLASTY;  Surgeon: Loanne Drilling, MD;  Location: [facility];  Service: Orthopedics;  Laterality: Left;  . TUBAL LIGATION      OB History    No data available       Home Medications    Prior to Admission medications   Medication Sig Start Date End Date Taking? Authorizing Provider  desvenlafaxine (PRISTIQ) 50 MG 24 hr tablet Take 50 mg by mouth every morning.   Yes Historical Provider, MD  metoprolol succinate (TOPROL-XL) 25 MG 24 hr tablet Take 25 mg by mouth 2 (two) times daily.   Yes Historical Provider, MD  SUMAtriptan (IMITREX) 100 MG tablet Take 100 mg by mouth every 2 (two) hours as needed for migraine. May repeat in 2 hours if headache persists or recurs.   Yes Historical Provider, MD  albuterol (PROVENTIL) (2.5 MG/3ML) 0.083% nebulizer solution Take 3 mLs (2.5 mg total) by nebulization every 4 (four) hours as needed for wheezing or shortness of breath. 08/01/16   Leroy Sea, MD  aspirin EC 81 MG tablet Take 1 tablet (81 mg  total) by mouth daily. 08/01/16   Leroy Sea, MD  loperamide (IMODIUM) 2 MG capsule Take 2 capsules (4 mg total) by mouth every 4 (four) hours as needed for diarrhea or loose stools. 08/01/16   Leroy Sea, MD  magnesium oxide (MAG-OX) 400 (241.3 Mg) MG tablet Take 1 tablet (400 mg total) by mouth daily. 08/01/16   Leroy Sea, MD  potassium chloride (K-DUR) 10 MEQ tablet Take 1 tablet (10 mEq total) by mouth daily. 08/01/16   Leroy Sea, MD    Family History Family History  Problem Relation Age of Onset  . Irritable bowel syndrome Sister     Social History Social History  Substance Use Topics    . Smoking status: Never Smoker  . Smokeless tobacco: Never Used  . Alcohol use No     Comment: recovering alcoholic x 7 years      Allergies   Codeine   Review of Systems Review of Systems  Constitutional: Negative for chills and fever.  HENT: Negative for congestion and sore throat.   Eyes: Negative.   Respiratory: Negative for chest tightness and shortness of breath.   Cardiovascular: Negative for chest pain and palpitations.  Gastrointestinal: Positive for diarrhea. Negative for abdominal pain, nausea and vomiting.  Genitourinary: Negative.   Musculoskeletal: Negative for arthralgias, joint swelling and neck pain.  Skin: Negative.  Negative for rash and wound.  Neurological: Positive for weakness. Negative for dizziness, facial asymmetry, speech difficulty, light-headedness, numbness and headaches.  Psychiatric/Behavioral: Negative.      Physical Exam Updated Vital Signs BP 112/69 (BP Location: Left Arm)   Pulse 95   Temp 98.6 F (37 C) (Oral)   Resp 16   Ht 5\' 2"  (1.575 m)   Wt 56.1 kg   SpO2 94%   BMI 22.62 kg/m   Physical Exam  Constitutional: She is oriented to person, place, and time. She appears well-developed and well-nourished.  HENT:  Head: Normocephalic and atraumatic.  Eyes: Conjunctivae are normal.  Neck: Normal range of motion.  Cardiovascular: Normal rate, regular rhythm, normal heart sounds and intact distal pulses.   Pulmonary/Chest: Effort normal and breath sounds normal. She has no wheezes.  Abdominal: Soft. Bowel sounds are normal. There is no tenderness.  Musculoskeletal: Normal range of motion.  Neurological: She is alert and oriented to person, place, and time. She displays no atrophy and no tremor. No cranial nerve deficit or sensory deficit. She exhibits normal muscle tone. Coordination normal.  Reflex Scores:      Bicep reflexes are 1+ on the right side and 1+ on the left side. Grip strength equal, 2/5 bilaterally.  Pt unable to SLR  legs.  Skin: Skin is warm and dry.  Psychiatric: She has a normal mood and affect.  Nursing note and vitals reviewed.    ED Treatments / Results  Labs (all labs ordered are listed, but only abnormal results are displayed) Labs Reviewed  CBC WITH DIFFERENTIAL/PLATELET - Abnormal; Notable for the following:       Result Value   RBC 3.86 (*)    MCH 34.5 (*)    All other components within normal limits  COMPREHENSIVE METABOLIC PANEL - Abnormal; Notable for the following:    Potassium <2.0 (*)    Chloride 100 (*)    CO2 33 (*)    Glucose, Bld 115 (*)    Calcium 8.8 (*)    Total Protein 5.9 (*)    Albumin 3.3 (*)  Total Bilirubin 1.3 (*)    All other components within normal limits  URINALYSIS, ROUTINE W REFLEX MICROSCOPIC - Abnormal; Notable for the following:    Bilirubin Urine SMALL (*)    Protein, ur TRACE (*)    Leukocytes, UA TRACE (*)    All other components within normal limits  URINALYSIS, MICROSCOPIC (REFLEX) - Abnormal; Notable for the following:    Bacteria, UA MANY (*)    Squamous Epithelial / LPF 0-5 (*)    All other components within normal limits  MAGNESIUM - Abnormal; Notable for the following:    Magnesium 1.3 (*)    All other components within normal limits  BASIC METABOLIC PANEL - Abnormal; Notable for the following:    Potassium <2.0 (*)    Glucose, Bld 115 (*)    Calcium 7.4 (*)    All other components within normal limits  CBC - Abnormal; Notable for the following:    RBC 3.27 (*)    Hemoglobin 11.4 (*)    HCT 32.8 (*)    MCV 100.3 (*)    MCH 34.9 (*)    All other components within normal limits  BASIC METABOLIC PANEL - Abnormal; Notable for the following:    Potassium 3.1 (*)    Chloride 115 (*)    Glucose, Bld 131 (*)    BUN 5 (*)    Calcium 7.4 (*)    All other components within normal limits  PHOSPHORUS - Abnormal; Notable for the following:    Phosphorus 1.4 (*)    All other components within normal limits  TROPONIN I - Abnormal;  Notable for the following:    Troponin I 0.04 (*)    All other components within normal limits  TROPONIN I - Abnormal; Notable for the following:    Troponin I 0.03 (*)    All other components within normal limits  CK - Abnormal; Notable for the following:    Total CK 659 (*)    All other components within normal limits  PHOSPHORUS - Abnormal; Notable for the following:    Phosphorus 5.0 (*)    All other components within normal limits  BASIC METABOLIC PANEL - Abnormal; Notable for the following:    Potassium 5.8 (*)    Chloride 115 (*)    Glucose, Bld 169 (*)    BUN 5 (*)    Calcium 7.2 (*)    All other components within normal limits  TROPONIN I - Abnormal; Notable for the following:    Troponin I 0.03 (*)    All other components within normal limits  BASIC METABOLIC PANEL - Abnormal; Notable for the following:    Glucose, Bld 237 (*)    BUN <5 (*)    Calcium 7.1 (*)    All other components within normal limits  BLOOD GAS, ARTERIAL - Abnormal; Notable for the following:    pO2, Arterial 82.9 (*)    Acid-base deficit 2.1 (*)    All other components within normal limits  TROPONIN I - Abnormal; Notable for the following:    Troponin I 0.04 (*)    All other components within normal limits  POTASSIUM - Abnormal; Notable for the following:    Potassium 7.4 (*)    All other components within normal limits  POTASSIUM - Abnormal; Notable for the following:    Potassium 7.3 (*)    All other components within normal limits  TROPONIN I - Abnormal; Notable for the following:    Troponin I  0.04 (*)    All other components within normal limits  TROPONIN I - Abnormal; Notable for the following:    Troponin I 0.04 (*)    All other components within normal limits  GLUCOSE, CAPILLARY - Abnormal; Notable for the following:    Glucose-Capillary 238 (*)    All other components within normal limits  BASIC METABOLIC PANEL - Abnormal; Notable for the following:    Potassium 3.1 (*)     Chloride 97 (*)    CO2 35 (*)    Glucose, Bld 104 (*)    Calcium 8.2 (*)    All other components within normal limits  BASIC METABOLIC PANEL - Abnormal; Notable for the following:    Chloride 93 (*)    CO2 36 (*)    Glucose, Bld 102 (*)    Calcium 8.5 (*)    All other components within normal limits  TSH - Abnormal; Notable for the following:    TSH 5.267 (*)    All other components within normal limits  BASIC METABOLIC PANEL - Abnormal; Notable for the following:    Chloride 92 (*)    CO2 34 (*)    Glucose, Bld 110 (*)    Calcium 8.5 (*)    All other components within normal limits  MAGNESIUM - Abnormal; Notable for the following:    Magnesium 1.5 (*)    All other components within normal limits  MRSA PCR SCREENING  C DIFFICILE QUICK SCREEN W PCR REFLEX  MAGNESIUM  MAGNESIUM  POTASSIUM  POTASSIUM  MAGNESIUM    EKG  EKG Interpretation  Date/Time:  Wednesday July 27 2016 11:23:46 EST Ventricular Rate:  192 PR Interval:    QRS Duration: 72 QT Interval:  264 QTC Calculation: 472 R Axis:   110 Text Interpretation:  Supraventricular tachycardia Right axis deviation Repolarization abnormality, prob rate related Baseline wander in lead(s) V4 V5 V6 Confirmed by ZACKOWSKI  MD, SCOTT ([id number]) on 07/27/2016 11:28:55 AM       Radiology No results found.  Procedures Procedures (including critical care time)  Medications Ordered in ED Medications  nitroGLYCERIN (NITROGLYN) 2 % ointment 0.5 inch (0.5 inches Topical Not Given 07/29/16 2323)  sodium chloride 0.9 % bolus 1,000 mL (0 mLs Intravenous Stopped 07/27/16 1544)  potassium chloride 10 mEq in 100 mL IVPB (0 mEq Intravenous Stopped 07/27/16 1456)  magnesium sulfate IVPB 1 g 100 mL (0 g Intravenous Stopped 07/27/16 1453)  Influenza vac split quadrivalent PF (FLUARIX) injection 0.5 mL (0.5 mLs Intramuscular Given 07/29/16 1054)  magnesium sulfate IVPB 2 g 50 mL (2 g Intravenous Given 07/27/16 1913)  potassium  chloride 10 mEq in 100 mL IVPB (10 mEq Intravenous Given 07/28/16 0918)  potassium chloride SA (K-DUR,KLOR-CON) CR tablet 40 mEq (40 mEq Oral Given 07/28/16 0605)  potassium chloride SA (K-DUR,KLOR-CON) CR tablet 40 mEq (40 mEq Oral Given 07/29/16 0041)  furosemide (LASIX) injection 40 mg (40 mg Intravenous Given 07/28/16 1520)  potassium phosphate 30 mmol in dextrose 5 % 500 mL infusion (30 mmol Intravenous Given 07/28/16 1738)  furosemide (LASIX) injection 40 mg (40 mg Intravenous Given 07/28/16 1942)  morphine 2 MG/ML injection 2 mg (2 mg Intravenous Given 07/28/16 2100)  nitroGLYCERIN (NITROGLYN) 2 % ointment 0.5 inch (0.5 inches Topical Given 07/28/16 2100)  magnesium sulfate IVPB 2 g 50 mL (2 g Intravenous Given 07/28/16 2116)  LORazepam (ATIVAN) injection 0.5 mg (0.5 mg Intravenous Given 07/30/16 0520)  albuterol (PROVENTIL) (2.5 MG/3ML) 0.083% nebulizer solution 10 mg (  10 mg Nebulization Given 07/29/16 0801)  insulin aspart (novoLOG) injection 10 Units (10 Units Intravenous Given 07/29/16 0805)  dextrose 50 % solution 50 mL (50 mLs Intravenous Given 07/29/16 0811)  sodium bicarbonate injection 50 mEq (50 mEq Intravenous Given 07/29/16 0811)  sodium polystyrene (KAYEXALATE) 15 GM/60ML suspension 15 g (15 g Oral Given 07/29/16 0826)  calcium gluconate 1 g in sodium chloride 0.9 % 100 mL IVPB (1 g Intravenous Given 07/29/16 0800)  dextrose 50 % solution (50 mLs  Given 07/29/16 0829)  furosemide (LASIX) injection 40 mg (40 mg Intravenous Given 07/29/16 0824)  sodium polystyrene (KAYEXALATE) 15 GM/60ML suspension 15 g (15 g Oral Given 07/29/16 1000)  furosemide (LASIX) injection 40 mg (40 mg Intravenous Given 07/29/16 1223)  metolazone (ZAROXOLYN) tablet 2.5 mg (2.5 mg Oral Given 07/29/16 0845)  iopamidol (ISOVUE-370) 76 % injection 100 mL (100 mLs Intravenous Contrast Given 07/29/16 1442)  potassium chloride SA (K-DUR,KLOR-CON) CR tablet 40 mEq (40 mEq Oral Given 07/30/16 1619)  magnesium  sulfate IVPB 2 g 50 mL (2 g Intravenous Given 07/30/16 1013)  furosemide (LASIX) injection 40 mg (40 mg Intravenous Given 07/31/16 0836)  magnesium sulfate IVPB 2 g 50 mL (2 g Intravenous Given 08/01/16 0934)     Initial Impression / Assessment and Plan / ED Course  I have reviewed the triage vital signs and the nursing notes.  Pertinent labs & imaging results that were available during my care of the patient were reviewed by me and considered in my medical decision making (see chart for details).  Clinical Course     Pt with profound hypokalemia and hypmagnesemia with institution of replacement per IV in the ed.  During her ed visit, she developed SVT transiently but was hemodynamically stable and tolerated well.  She was given IV potassium and magnesium.  SVT broke spontaneously without need of adenosine.  Called for admission.  CRITICAL CARE Performed by: Burgess AmorIDOL, Laresa Oshiro Total critical care time: 30 minutes Critical care time was exclusive of separately billable procedures and treating other patients. Critical care was necessary to treat or prevent imminent or life-threatening deterioration. Critical care was time spent personally by me on the following activities: development of treatment plan with patient and/or surrogate as well as nursing, discussions with consultants, evaluation of patient's response to treatment, examination of patient, obtaining history from patient or surrogate, ordering and performing treatments and interventions, ordering and review of laboratory studies, ordering and review of radiographic studies, pulse oximetry and re-evaluation of patient's condition.   Pt was seen by Dr. Deretha EmoryZackowski during this admission.  Final Clinical Impressions(s) / ED Diagnoses   Final diagnoses:  Hypokalemia  Hypomagnesemia  Chronic diarrhea    New Prescriptions Discharge Medication List as of 08/01/2016  9:15 AM    START taking these medications   Details  albuterol  (PROVENTIL) (2.5 MG/3ML) 0.083% nebulizer solution Take 3 mLs (2.5 mg total) by nebulization every 4 (four) hours as needed for wheezing or shortness of breath., Starting Mon 08/01/2016, Print    aspirin EC 81 MG tablet Take 1 tablet (81 mg total) by mouth daily., Starting Mon 08/01/2016, Print    loperamide (IMODIUM) 2 MG capsule Take 2 capsules (4 mg total) by mouth every 4 (four) hours as needed for diarrhea or loose stools., Starting Mon 08/01/2016, Print    magnesium oxide (MAG-OX) 400 (241.3 Mg) MG tablet Take 1 tablet (400 mg total) by mouth daily., Starting Mon 08/01/2016, Print    potassium chloride (K-DUR) 10 MEQ tablet  Take 1 tablet (10 mEq total) by mouth daily., Starting Mon 08/01/2016, Print    furosemide (LASIX) 40 MG tablet Take 1 tablet (40 mg total) by mouth daily., Starting Mon 08/01/2016, Print         Burgess Amor, PA-C 08/13/16 1610    Vanetta Mulders, MD 08/13/16 [phone]

## 2016-08-19 ENCOUNTER — Ambulatory Visit (HOSPITAL_COMMUNITY)
Admission: RE | Admit: 2016-08-19 | Discharge: 2016-08-19 | Disposition: A | Discharge status: Home / Self Care | Payer: Managed Care, Other (non HMO) | Source: Ambulatory Visit | Attending: Internal Medicine | Admitting: Internal Medicine

## 2016-08-19 ENCOUNTER — Encounter (HOSPITAL_COMMUNITY): Payer: Self-pay

## 2016-08-19 ENCOUNTER — Encounter (HOSPITAL_COMMUNITY)
Admission: RE | Disposition: A | Discharge status: Home / Self Care | Payer: Self-pay | Source: Ambulatory Visit | Attending: Internal Medicine

## 2016-08-19 DIAGNOSIS — K580 Irritable bowel syndrome with diarrhea: Secondary | ICD-10-CM | POA: Diagnosis not present

## 2016-08-19 DIAGNOSIS — K222 Esophageal obstruction: Secondary | ICD-10-CM | POA: Diagnosis not present

## 2016-08-19 DIAGNOSIS — Z79899 Other long term (current) drug therapy: Secondary | ICD-10-CM | POA: Diagnosis not present

## 2016-08-19 DIAGNOSIS — R000 Tachycardia, unspecified: Secondary | ICD-10-CM | POA: Insufficient documentation

## 2016-08-19 DIAGNOSIS — Z7982 Long term (current) use of aspirin: Secondary | ICD-10-CM | POA: Diagnosis not present

## 2016-08-19 DIAGNOSIS — K766 Portal hypertension: Secondary | ICD-10-CM | POA: Insufficient documentation

## 2016-08-19 DIAGNOSIS — Z9049 Acquired absence of other specified parts of digestive tract: Secondary | ICD-10-CM | POA: Insufficient documentation

## 2016-08-19 DIAGNOSIS — Z885 Allergy status to narcotic agent status: Secondary | ICD-10-CM | POA: Insufficient documentation

## 2016-08-19 DIAGNOSIS — R197 Diarrhea, unspecified: Secondary | ICD-10-CM

## 2016-08-19 DIAGNOSIS — Z96651 Presence of right artificial knee joint: Secondary | ICD-10-CM | POA: Insufficient documentation

## 2016-08-19 DIAGNOSIS — R935 Abnormal findings on diagnostic imaging of other abdominal regions, including retroperitoneum: Secondary | ICD-10-CM

## 2016-08-19 DIAGNOSIS — K3189 Other diseases of stomach and duodenum: Secondary | ICD-10-CM

## 2016-08-19 DIAGNOSIS — M170 Bilateral primary osteoarthritis of knee: Secondary | ICD-10-CM | POA: Insufficient documentation

## 2016-08-19 DIAGNOSIS — K2950 Unspecified chronic gastritis without bleeding: Secondary | ICD-10-CM | POA: Insufficient documentation

## 2016-08-19 DIAGNOSIS — F419 Anxiety disorder, unspecified: Secondary | ICD-10-CM | POA: Insufficient documentation

## 2016-08-19 DIAGNOSIS — K449 Diaphragmatic hernia without obstruction or gangrene: Secondary | ICD-10-CM | POA: Diagnosis not present

## 2016-08-19 DIAGNOSIS — Z8379 Family history of other diseases of the digestive system: Secondary | ICD-10-CM | POA: Insufficient documentation

## 2016-08-19 DIAGNOSIS — R933 Abnormal findings on diagnostic imaging of other parts of digestive tract: Secondary | ICD-10-CM

## 2016-08-19 HISTORY — PX: ESOPHAGOGASTRODUODENOSCOPY: SHX5428

## 2016-08-19 SURGERY — EGD (ESOPHAGOGASTRODUODENOSCOPY)
Anesthesia: Moderate Sedation

## 2016-08-19 MED ORDER — PROMETHAZINE HCL 25 MG/ML IJ SOLN
INTRAMUSCULAR | Status: DC | PRN
  Administered 2016-08-19 (×2): 6.25 mg via INTRAVENOUS

## 2016-08-19 MED ORDER — PROMETHAZINE HCL 25 MG/ML IJ SOLN
INTRAMUSCULAR | Status: AC
  Filled 2016-08-19: qty 1

## 2016-08-19 MED ORDER — MEPERIDINE HCL 50 MG/ML IJ SOLN
INTRAMUSCULAR | Status: DC | PRN
  Administered 2016-08-19 (×2): 25 mg via INTRAVENOUS

## 2016-08-19 MED ORDER — MEPERIDINE HCL 50 MG/ML IJ SOLN
INTRAMUSCULAR | Status: AC
  Filled 2016-08-19: qty 1

## 2016-08-19 MED ORDER — BUTAMBEN-TETRACAINE-BENZOCAINE 2-2-14 % EX AERO
INHALATION_SPRAY | CUTANEOUS | Status: DC | PRN
  Administered 2016-08-19: 1 via TOPICAL

## 2016-08-19 MED ORDER — MIDAZOLAM HCL 5 MG/5ML IJ SOLN
INTRAMUSCULAR | Status: DC | PRN
  Administered 2016-08-19: 1 mg via INTRAVENOUS
  Administered 2016-08-19 (×2): 2 mg via INTRAVENOUS

## 2016-08-19 MED ORDER — LOPERAMIDE HCL 2 MG PO CAPS: 2.0000 mg | ORAL_CAPSULE | ORAL | 0 refills | Status: DC

## 2016-08-19 MED ORDER — SODIUM CHLORIDE 0.9 % IV SOLN
INTRAVENOUS | Status: DC
  Administered 2016-08-19: 12:00:00 via INTRAVENOUS

## 2016-08-19 MED ORDER — MIDAZOLAM HCL 5 MG/5ML IJ SOLN
INTRAMUSCULAR | Status: AC
  Filled 2016-08-19: qty 10

## 2016-08-19 NOTE — H&P (Signed)
Kathleen Velazquez is an 61 y.o. female.   Chief Complaint: Patient is here for EGD. HPI: Patient is 61 year old Caucasian female who was recently hospitalized for a profound hypokalemia and dehydration. C. difficile was negative. She was also having respiratory symptoms and underwent CT Angela chest which revealed thickening in the region of cardia raising suspicions of mass. She has therefore undergoing diagnostic EGD. She denies heartburn dysphagia nausea vomiting or abdominal pain. She has had diarrhea for several years. She had diagnostic colonoscopy in October 2011 and was normal and so her sigmoid colon biopsies. She has been afraid to take medications because she does not want to get constipated.  Past Medical History:  Diagnosis Date  . Anxiety   . Arthritis    knees,   . Ejection fraction    Normal, echo, June, 2013      . Headache(784.0)    hx of migraines   . Irritable bowel syndrome (IBS)   . PONV (postoperative nausea and vomiting)   . Sinus tachycardia    Persistent sinus tachycardia, June, 2013 ( prior monitor in 2007 had shown no significant abnormalities.)    Past Surgical History:  Procedure Laterality Date  . CHOLECYSTECTOMY    . HEMORRHOID SURGERY N/A 05/20/2013   Procedure: EXTENSIVE HEMORRHOIDECTOMY;  Surgeon: Dalia HeadingMark A Jenkins, MD;  Location: [facility];  Service: General;  Laterality: N/A;  . JOINT REPLACEMENT     right knee replacement   . OTHER SURGICAL HISTORY     left breast biopsy - benign   . OTHER SURGICAL HISTORY     hx of skin cancer surgery   . TOTAL KNEE ARTHROPLASTY  11/28/2011   Procedure: TOTAL KNEE ARTHROPLASTY;  Surgeon: Loanne DrillingFrank V Aluisio, MD;  Location: [facility];  Service: Orthopedics;  Laterality: Left;  . TUBAL LIGATION      Family History  Problem Relation Age of Onset  . Irritable bowel syndrome Sister    Social History:  reports that she has never smoked. She has never used smokeless tobacco. She reports that she does not drink alcohol or use  drugs.  Allergies:  Allergies  Allergen Reactions  . Codeine Nausea And Vomiting    Medications Prior to Admission  Medication Sig Dispense Refill  . albuterol (PROVENTIL) (2.5 MG/3ML) 0.083% nebulizer solution Take 3 mLs (2.5 mg total) by nebulization every 4 (four) hours as needed for wheezing or shortness of breath. 75 mL 0  . aspirin EC 81 MG tablet Take 1 tablet (81 mg total) by mouth daily. 30 tablet 0  . loperamide (IMODIUM) 2 MG capsule Take 2 capsules (4 mg total) by mouth every 4 (four) hours as needed for diarrhea or loose stools. 30 capsule 0  . magnesium oxide (MAG-OX) 400 (241.3 Mg) MG tablet Take 1 tablet (400 mg total) by mouth daily. 30 tablet 0  . metoprolol tartrate (LOPRESSOR) 25 MG tablet Take 25 mg by mouth 2 (two) times daily.    . potassium chloride (K-DUR) 10 MEQ tablet Take 1 tablet (10 mEq total) by mouth daily. 30 tablet 0  . SUMAtriptan (IMITREX) 100 MG tablet Take 100 mg by mouth every 2 (two) hours as needed for migraine. May repeat in 2 hours if headache persists or recurs.      No results found for this or any previous visit (from the past 48 hour(s)). No results found.  ROS  Blood pressure (!) 137/98, pulse 98, temperature 98.1 F (36.7 C), temperature source Oral, resp. rate 16, SpO2 99 %.  Physical Exam  Constitutional: She appears well-developed and well-nourished.  HENT:  Mouth/Throat: Oropharynx is clear and moist.  Eyes: Conjunctivae are normal. No scleral icterus.  Neck: No thyromegaly present.  Cardiovascular: Normal rate, regular rhythm and normal heart sounds.   No murmur heard. Respiratory: Effort normal and breath sounds normal.  GI: Soft. She exhibits no distension and no mass. There is no tenderness.  Musculoskeletal: She exhibits no edema.  Lymphadenopathy:    She has no cervical adenopathy.  Neurological: She is alert.  Skin: Skin is warm and dry.     Assessment/Plan Abnormal chest CT raising suspicion of mass and  cardia. Patient has no symptoms pertaining to upper GI tract. Diagnostic EGD.  Lionel December, MD 08/19/2016, 1:08 PM

## 2016-08-19 NOTE — Op Note (Signed)
[facility] Patient Name: Kathleen Velazquez Procedure Date: 08/19/2016 12:08 PM MRN: [id number] Date of Birth: 1956-06-30 Attending MD: Lionel December , MD CSN: [id number] Age: 61 Admit Type: Outpatient Procedure:                Upper GI endoscopy Indications:              Abnormal CT of the GI tract Providers:                Lionel December, MD, Loma Messing B. Patsy Lager, RN, Nena Polio, RN, Dyann Ruddle, Burke Keels, Technician Referring MD:             Lia Hopping, MD Medicines:                Cetacaine spray, Promethazine 12.5 mg IV,                            Meperidine 50 mg IV, Midazolam 5 mg IV Complications:            No immediate complications. Estimated Blood Loss:     Estimated blood loss was minimal. Procedure:                Pre-Anesthesia Assessment:                           - Prior to the procedure, a History and Physical                            was performed, and patient medications and                            allergies were reviewed. The patient's tolerance of                            previous anesthesia was also reviewed. The risks                            and benefits of the procedure and the sedation                            options and risks were discussed with the patient.                            All questions were answered, and informed consent                            was obtained. Prior Anticoagulants: The patient                            last took aspirin 1 day prior to the procedure. ASA                            Grade Assessment: II - A patient with mild systemic  disease. After reviewing the risks and benefits,                            the patient was deemed in satisfactory condition to                            undergo the procedure.                           After obtaining informed consent, the endoscope was                            passed under direct vision. Throughout the                           procedure, the patient's blood pressure, pulse, and                            oxygen saturations were monitored continuously. The                            [id number] ([id number] scope was introduced through the                            mouth, and advanced to the second part of duodenum.                            The upper GI endoscopy was accomplished without                            difficulty. The patient tolerated the procedure                            well. Scope In: 1:21:54 PM Scope Out: 1:29:28 PM Total Procedure Duration: 0 hours 7 minutes 34 seconds  Findings:      The examined esophagus was normal.      The Z-line was regular and was found 33 cm from the incisors.      A non-obstructing and mild Schatzki ring (acquired) was found at the       gastroesophageal junction.      A 3 cm hiatal hernia was present.      Mild portal hypertensive gastropathy was found in the gastric fundus and       in the gastric body. Biopsies were taken with a cold forceps for       histology.      The exam of the stomach was otherwise normal.      The duodenal bulb and second portion of the duodenum were normal.       Biopsies were taken with a cold forceps for histology. Impression:               - Normal esophagus.                           - Z-line regular, 33 cm from the incisors.                           -  Non-obstructing and mild Schatzki ring.                           - 3 cm hiatal hernia.                           - Portal hypertensive gastropathy. Biopsied.                           - Normal duodenal bulb and second portion of the                            duodenum. Biopsied(because of chronic diarrhea).                           Comment: No mass notedsuspected on chest CT.                           ring was not manipulated because patienthave                            dysphagia. Moderate Sedation:      Moderate (conscious) sedation was administered by  the endoscopy nurse       and supervised by the endoscopist. The following parameters were       monitored: oxygen saturation, heart rate, blood pressure, CO2       capnography and response to care. Total physician intraservice time was       13 minutes. Recommendation:           - Patient has a contact number available for                            emergencies. The signs and symptoms of potential                            delayed complications were discussed with the                            patient. Return to normal activities tomorrow.                            Written discharge instructions were provided to the                            patient.                           - Resume previous diet today.                           - Continue present medications.                           - Patient advised to take Imodium OTC 2 mg by mouth  every morning.                           - Await pathology results. Procedure Code(s):        --- Professional ---                           [phone], Esophagogastroduodenoscopy, flexible,                            transoral; with biopsy, single or multiple                           99152, Moderate sedation services provided by the                            same physician or other qualified health care                            professional performing the diagnostic or                            therapeutic service that the sedation supports,                            requiring the presence of an independent trained                            observer to assist in the monitoring of the                            patient's level of consciousness and physiological                            status; initial 15 minutes of intraservice time,                            patient age 22 years or older Diagnosis Code(s):        --- Professional ---                           K22.2, Esophageal obstruction                            K44.9, Diaphragmatic hernia without obstruction or                            gangrene                           K76.6, Portal hypertension                           K31.89, Other diseases of stomach and duodenum                           R93.3, Abnormal findings  on diagnostic imaging of                            other parts of digestive tract CPT copyright 2016 [employer]. All rights reserved. The codes documented in this report are preliminary and upon coder review may  be revised to meet current compliance requirements. Lionel December, MD Lionel December, MD 08/19/2016 2:04:11 PM This report has been signed electronically. Number of Addenda: 0

## 2016-08-19 NOTE — Discharge Instructions (Signed)
Resume usual medications and diet. Take Imodium OTC 2 mg by mouth every morning. Keep stool diary as to frequency and consistency of stools for the next 4 weeks. No driving for 24 hours. Physician will call with biopsy results.   Upper Endoscopy, Care After Refer to this sheet in the next few weeks. These instructions provide you with information about caring for yourself after your procedure. Your health care provider may also give you more specific instructions. Your treatment has been planned according to current medical practices, but problems sometimes occur. Call your health care provider if you have any problems or questions after your procedure. What can I expect after the procedure? After the procedure, it is common to have:  A sore throat.  Bloating.  Nausea. Follow these instructions at home:  Follow instructions from your health care provider about what to eat or drink after your procedure.  Return to your normal activities as told by your health care provider. Ask your health care provider what activities are safe for you.  Take over-the-counter and prescription medicines only as told by your health care provider.  Do not drive for 24 hours if you received a sedative.  Keep all follow-up visits as told by your health care provider. This is important. Contact a health care provider if:  You have a sore throat that lasts longer than one day.  You have trouble swallowing. Get help right away if:  You have a fever.  You vomit blood or your vomit looks like coffee grounds.  You have bloody, black, or tarry stools.  You have a severe sore throat or you cannot swallow.  You have difficulty breathing.  You have severe pain in your chest or belly. This information is not intended to replace advice given to you by your health care provider. Make sure you discuss any questions you have with your health care provider. Document Released: 01/24/2012 Document Revised:  12/31/2015 Document Reviewed: 05/07/2015 Elsevier Interactive Patient Education  2017 [employer]

## 2016-08-25 ENCOUNTER — Encounter (HOSPITAL_COMMUNITY): Payer: Self-pay | Admitting: Internal Medicine

## 2016-08-26 ENCOUNTER — Telehealth (INDEPENDENT_AMBULATORY_CARE_PROVIDER_SITE_OTHER): Payer: Self-pay | Admitting: *Deleted

## 2016-08-26 NOTE — Telephone Encounter (Signed)
Patient presented to the ov for a weight check om 08/16/2016. She weighed 120.1lbs. Dr.Rehman was made aware of this and that she wanted her results. It is documented that the patient 's husband was called and given results.

## 2016-09-19 ENCOUNTER — Encounter (INDEPENDENT_AMBULATORY_CARE_PROVIDER_SITE_OTHER): Payer: Self-pay | Admitting: Internal Medicine

## 2016-09-19 NOTE — Progress Notes (Signed)
Patient was given an appointment with Dorene Arerri Setzer, NP for 12/05/16 at 2:15pm.  A letter was mailed to the patient.

## 2016-09-26 ENCOUNTER — Other Ambulatory Visit (INDEPENDENT_AMBULATORY_CARE_PROVIDER_SITE_OTHER): Payer: Self-pay | Admitting: Internal Medicine

## 2016-12-05 ENCOUNTER — Ambulatory Visit (INDEPENDENT_AMBULATORY_CARE_PROVIDER_SITE_OTHER): Payer: Managed Care, Other (non HMO) | Admitting: Internal Medicine

## 2018-03-09 IMAGING — CR DG CHEST 1V PORT
1 series · 1 of 1 positions shown · non-contrast
Comparison: July 28, 2016

CLINICAL DATA: Shortness of Breath

EXAM:
PORTABLE CHEST 1 VIEW

[portable]
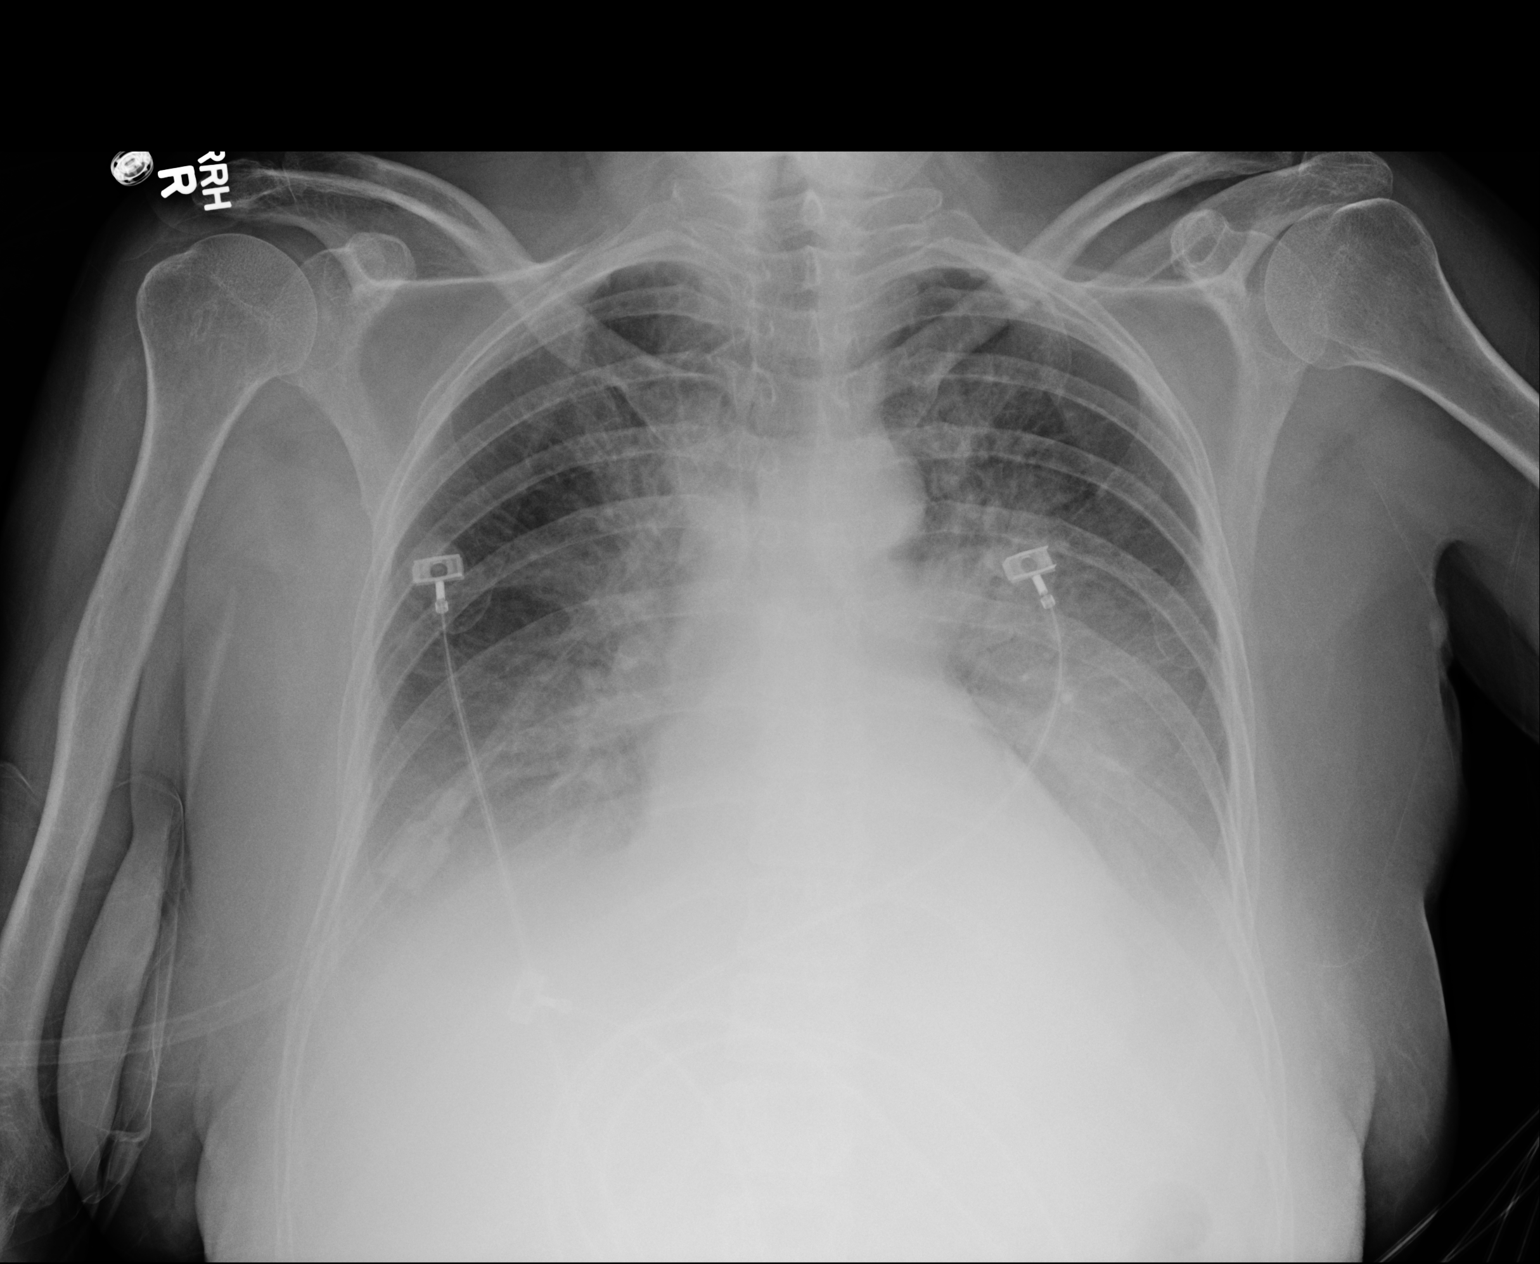

[1 of 1 positions shown; findings below may reference images not displayed]

FINDINGS: There is persistence of interstitial pulmonary edema. There is
extensive new alveolar opacity throughout both mid and lower lung
zones, slightly more on the left than on the right. There are
bilateral pleural effusions with cardiomegaly. There is pulmonary
venous hypertension. No adenopathy evident.
IMPRESSION: Progression of congestive heart failure, now or pleural effusions an
apparent alveolar edema as well as persistent interstitial edema. A
degree of superimposed pneumonia cannot be excluded
radiographically. The cardiac silhouette is stable.

## 2018-08-13 DIAGNOSIS — M109 Gout, unspecified: Secondary | ICD-10-CM | POA: Diagnosis not present

## 2018-08-13 DIAGNOSIS — M1000 Idiopathic gout, unspecified site: Secondary | ICD-10-CM | POA: Diagnosis not present

## 2018-08-13 DIAGNOSIS — I10 Essential (primary) hypertension: Secondary | ICD-10-CM | POA: Diagnosis not present

## 2018-08-13 DIAGNOSIS — M47816 Spondylosis without myelopathy or radiculopathy, lumbar region: Secondary | ICD-10-CM | POA: Diagnosis not present

## 2018-08-13 DIAGNOSIS — Z6826 Body mass index (BMI) 26.0-26.9, adult: Secondary | ICD-10-CM | POA: Diagnosis not present

## 2018-08-13 DIAGNOSIS — M9903 Segmental and somatic dysfunction of lumbar region: Secondary | ICD-10-CM | POA: Diagnosis not present

## 2018-08-17 DIAGNOSIS — M47816 Spondylosis without myelopathy or radiculopathy, lumbar region: Secondary | ICD-10-CM | POA: Diagnosis not present

## 2018-08-17 DIAGNOSIS — M1000 Idiopathic gout, unspecified site: Secondary | ICD-10-CM | POA: Diagnosis not present

## 2018-08-17 DIAGNOSIS — M9903 Segmental and somatic dysfunction of lumbar region: Secondary | ICD-10-CM | POA: Diagnosis not present

## 2018-08-21 DIAGNOSIS — M1000 Idiopathic gout, unspecified site: Secondary | ICD-10-CM | POA: Diagnosis not present

## 2018-08-21 DIAGNOSIS — M9903 Segmental and somatic dysfunction of lumbar region: Secondary | ICD-10-CM | POA: Diagnosis not present

## 2018-08-21 DIAGNOSIS — M47816 Spondylosis without myelopathy or radiculopathy, lumbar region: Secondary | ICD-10-CM | POA: Diagnosis not present

## 2018-08-28 DIAGNOSIS — M9903 Segmental and somatic dysfunction of lumbar region: Secondary | ICD-10-CM | POA: Diagnosis not present

## 2018-08-28 DIAGNOSIS — M47816 Spondylosis without myelopathy or radiculopathy, lumbar region: Secondary | ICD-10-CM | POA: Diagnosis not present

## 2018-08-28 DIAGNOSIS — M1000 Idiopathic gout, unspecified site: Secondary | ICD-10-CM | POA: Diagnosis not present

## 2018-09-04 DIAGNOSIS — M1000 Idiopathic gout, unspecified site: Secondary | ICD-10-CM | POA: Diagnosis not present

## 2018-09-04 DIAGNOSIS — M47816 Spondylosis without myelopathy or radiculopathy, lumbar region: Secondary | ICD-10-CM | POA: Diagnosis not present

## 2018-09-04 DIAGNOSIS — M9903 Segmental and somatic dysfunction of lumbar region: Secondary | ICD-10-CM | POA: Diagnosis not present

## 2018-09-14 DIAGNOSIS — M1000 Idiopathic gout, unspecified site: Secondary | ICD-10-CM | POA: Diagnosis not present

## 2018-09-14 DIAGNOSIS — M47816 Spondylosis without myelopathy or radiculopathy, lumbar region: Secondary | ICD-10-CM | POA: Diagnosis not present

## 2018-09-14 DIAGNOSIS — M9903 Segmental and somatic dysfunction of lumbar region: Secondary | ICD-10-CM | POA: Diagnosis not present

## 2018-09-18 DIAGNOSIS — M9903 Segmental and somatic dysfunction of lumbar region: Secondary | ICD-10-CM | POA: Diagnosis not present

## 2018-09-18 DIAGNOSIS — M47816 Spondylosis without myelopathy or radiculopathy, lumbar region: Secondary | ICD-10-CM | POA: Diagnosis not present

## 2018-09-18 DIAGNOSIS — M1000 Idiopathic gout, unspecified site: Secondary | ICD-10-CM | POA: Diagnosis not present

## 2018-09-21 DIAGNOSIS — M1000 Idiopathic gout, unspecified site: Secondary | ICD-10-CM | POA: Diagnosis not present

## 2018-09-21 DIAGNOSIS — M9903 Segmental and somatic dysfunction of lumbar region: Secondary | ICD-10-CM | POA: Diagnosis not present

## 2018-09-21 DIAGNOSIS — M47816 Spondylosis without myelopathy or radiculopathy, lumbar region: Secondary | ICD-10-CM | POA: Diagnosis not present

## 2018-09-25 DIAGNOSIS — M109 Gout, unspecified: Secondary | ICD-10-CM | POA: Diagnosis not present

## 2018-09-25 DIAGNOSIS — M9903 Segmental and somatic dysfunction of lumbar region: Secondary | ICD-10-CM | POA: Diagnosis not present

## 2018-09-25 DIAGNOSIS — M47816 Spondylosis without myelopathy or radiculopathy, lumbar region: Secondary | ICD-10-CM | POA: Diagnosis not present

## 2018-10-01 DIAGNOSIS — M47816 Spondylosis without myelopathy or radiculopathy, lumbar region: Secondary | ICD-10-CM | POA: Diagnosis not present

## 2018-10-01 DIAGNOSIS — M9903 Segmental and somatic dysfunction of lumbar region: Secondary | ICD-10-CM | POA: Diagnosis not present

## 2018-10-01 DIAGNOSIS — M109 Gout, unspecified: Secondary | ICD-10-CM | POA: Diagnosis not present

## 2018-10-08 DIAGNOSIS — M109 Gout, unspecified: Secondary | ICD-10-CM | POA: Diagnosis not present

## 2018-10-08 DIAGNOSIS — M47816 Spondylosis without myelopathy or radiculopathy, lumbar region: Secondary | ICD-10-CM | POA: Diagnosis not present

## 2018-10-08 DIAGNOSIS — M9903 Segmental and somatic dysfunction of lumbar region: Secondary | ICD-10-CM | POA: Diagnosis not present

## 2018-10-15 DIAGNOSIS — M47816 Spondylosis without myelopathy or radiculopathy, lumbar region: Secondary | ICD-10-CM | POA: Diagnosis not present

## 2018-10-15 DIAGNOSIS — M109 Gout, unspecified: Secondary | ICD-10-CM | POA: Diagnosis not present

## 2018-10-15 DIAGNOSIS — M9903 Segmental and somatic dysfunction of lumbar region: Secondary | ICD-10-CM | POA: Diagnosis not present

## 2018-11-26 IMAGING — CT CT HEAD W/O CM
3 series · 15 of 47 positions shown, 18 images · non-contrast
Comparison: None.

CLINICAL DATA: Lower extremity weakness for 1 day

EXAM:
CT HEAD WITHOUT CONTRAST
TECHNIQUE: Contiguous axial images were obtained from the base of the skull
through the vertex without intravenous contrast.

[Series 2: head wo · axial · 0.41mm/px · z∈[+41,+166]mm · 9 of 30 slices shown, 12 images]
[im 3/30  brain]
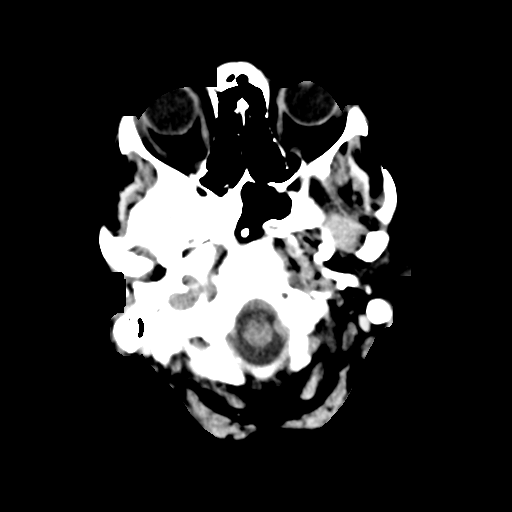
[im 3/30  bone]
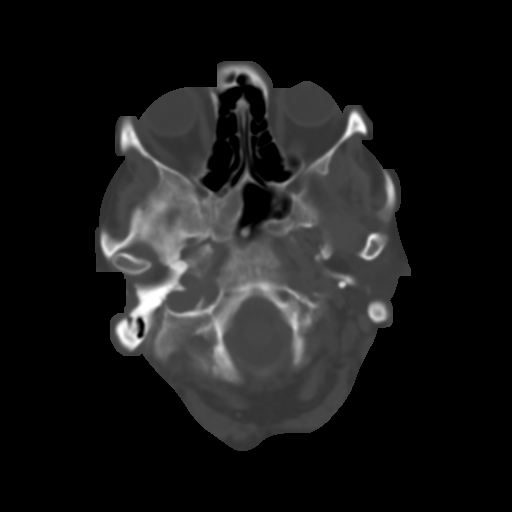
[im 6/30  brain]
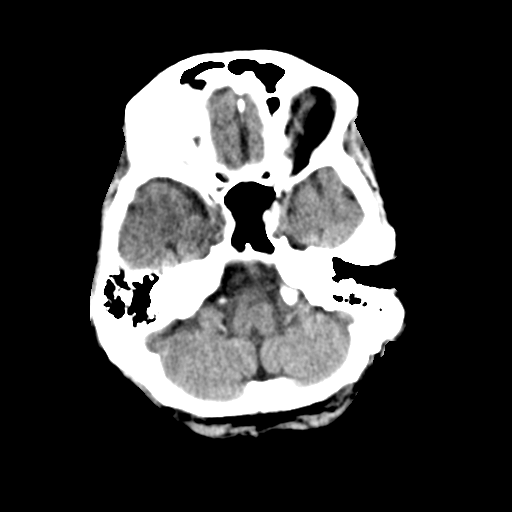
[im 9/30  brain]
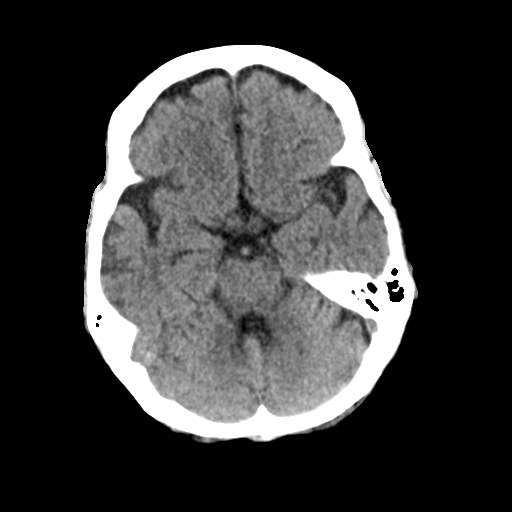
[im 12/30  brain]
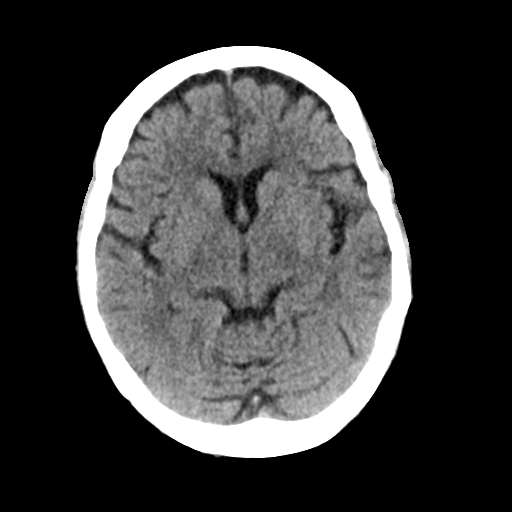
[im 16/30  brain]
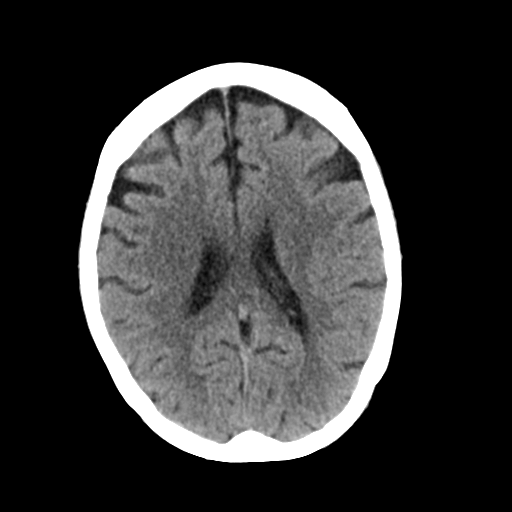
[im 16/30  bone]
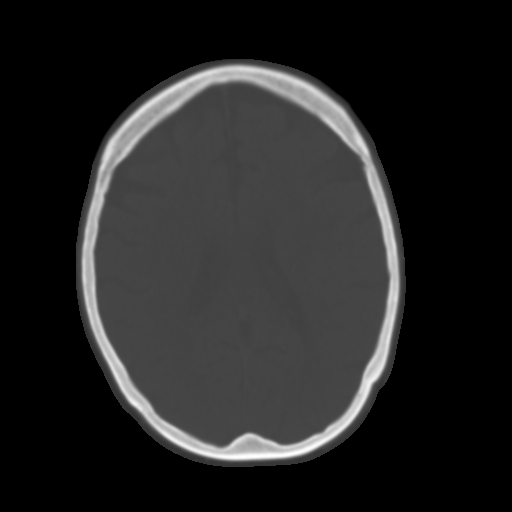
[im 19/30  brain]
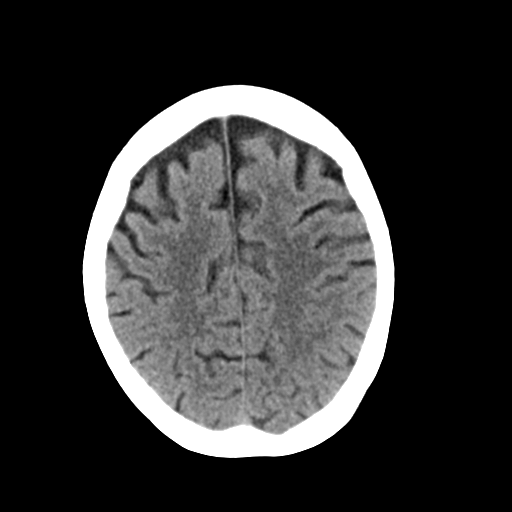
[im 22/30  brain]
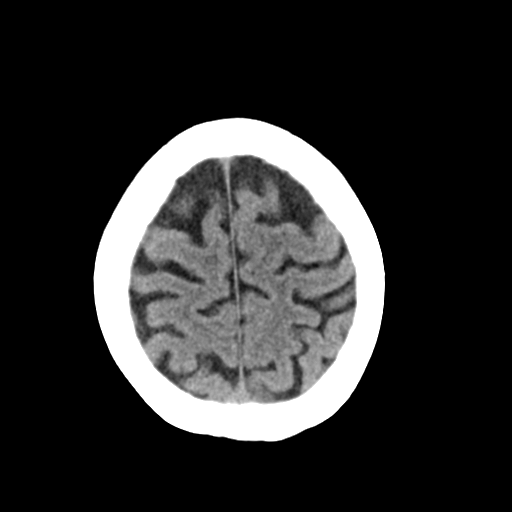
[im 25/30  brain]
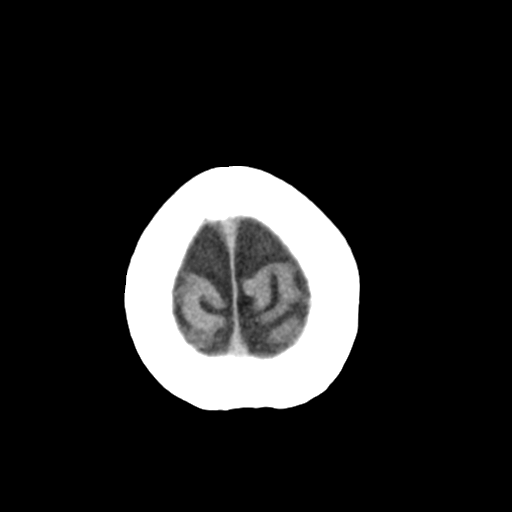
[im 28/30  brain]
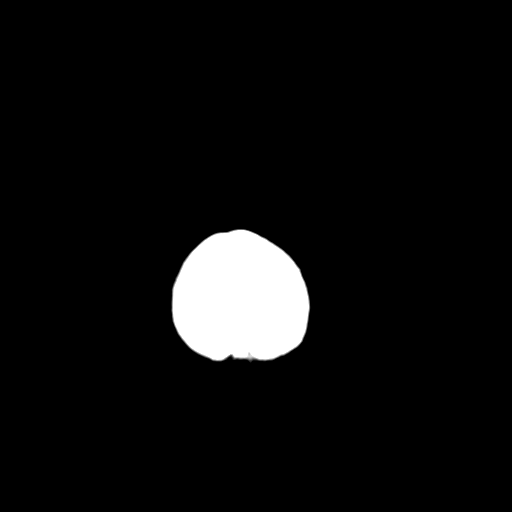
[im 28/30  bone]
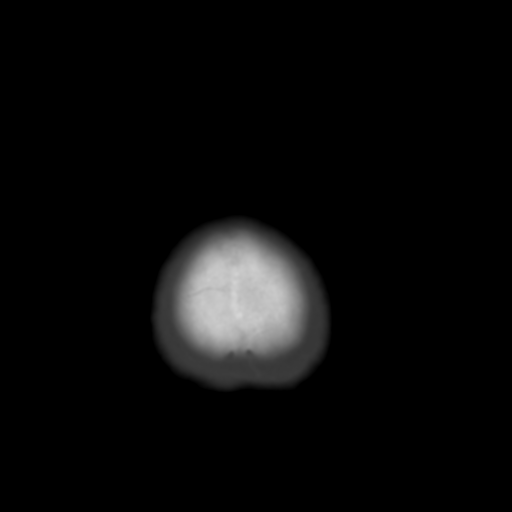

[Series 4: coronal soft tissue · coronal · 0.30mm/px · 3 of 67 slices shown]
[im 23/67  brain]
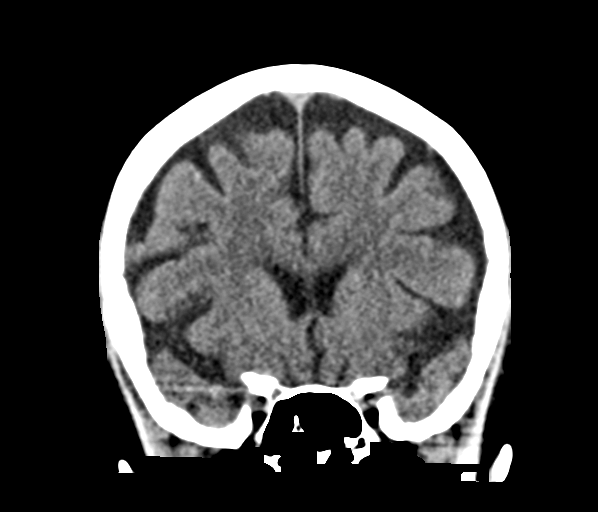
[im 30/67  brain]
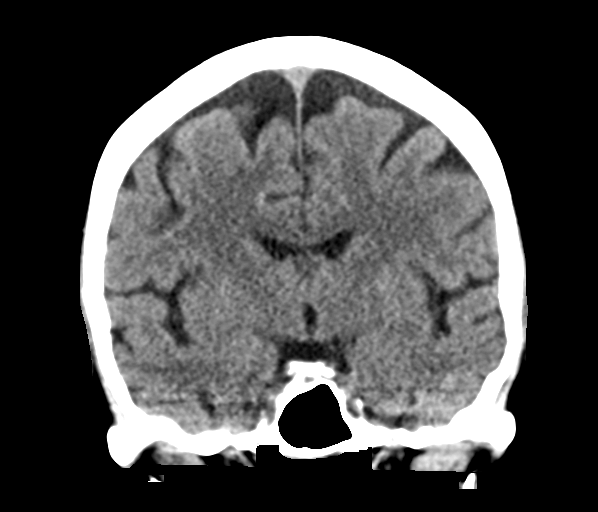
[im 37/67  brain]
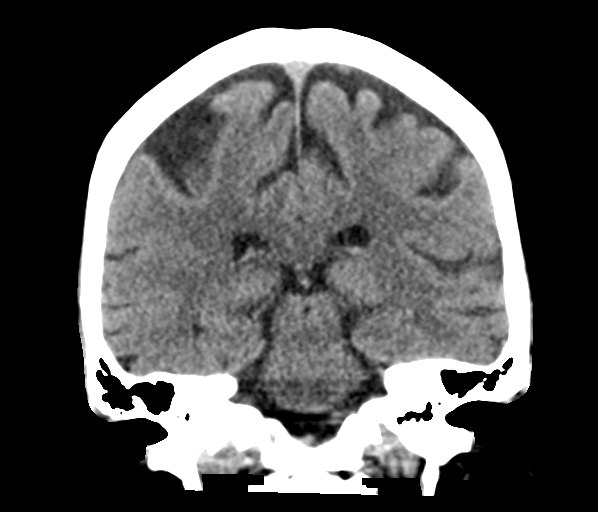

[Series 5: sagittal soft tissue · sagittal · 0.33mm/px · 3 of 53 slices shown]
[im 18/53  brain]
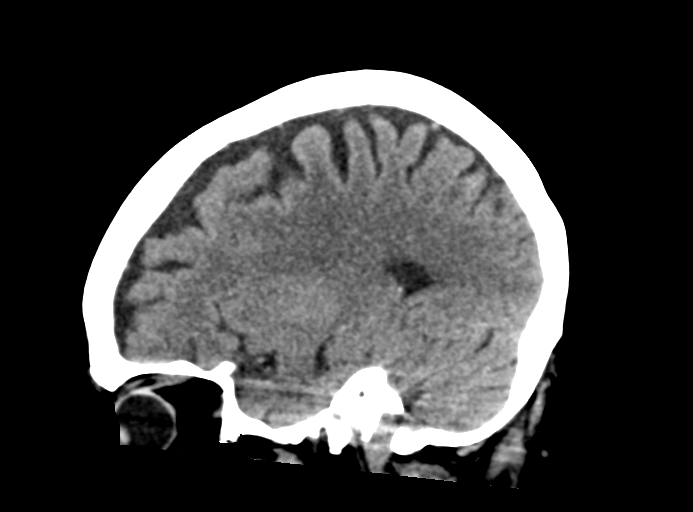
[im 27/53  brain]
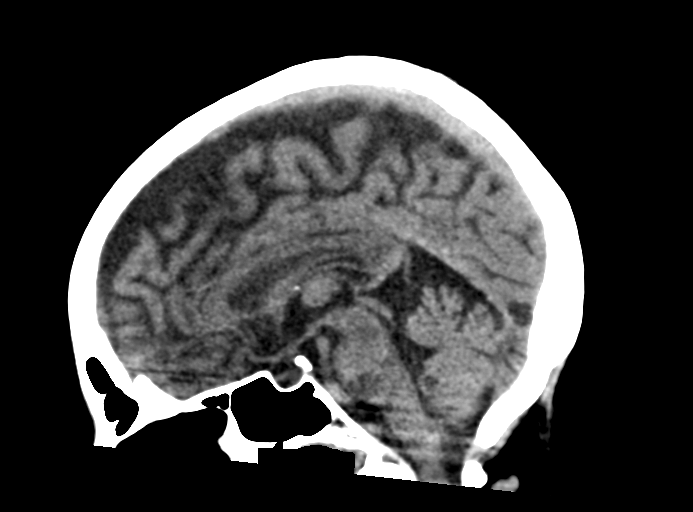
[im 35/53  brain]
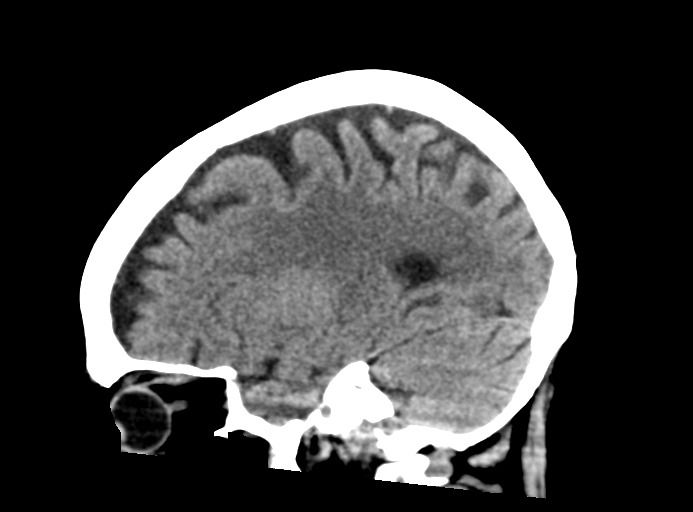

[15 of 47 positions shown; findings below may reference images not displayed]

FINDINGS: Brain: No mass effect, midline shift, or acute hemorrhage. Mild
global atrophy. Minimal chronic ischemic changes in the
periventricular white matter.

Vascular: No hyperdense vessel or unexpected calcification.

Skull: Normal. Negative for fracture or focal lesion.

Sinuses/Orbits: No acute finding.

Other: None.
IMPRESSION: No acute intracranial pathology.  Mild chronic changes.

## 2019-02-12 DIAGNOSIS — H524 Presbyopia: Secondary | ICD-10-CM | POA: Diagnosis not present

## 2019-02-12 DIAGNOSIS — Z0100 Encounter for examination of eyes and vision without abnormal findings: Secondary | ICD-10-CM | POA: Diagnosis not present

## 2019-02-20 DIAGNOSIS — I10 Essential (primary) hypertension: Secondary | ICD-10-CM | POA: Diagnosis not present

## 2019-02-20 DIAGNOSIS — M109 Gout, unspecified: Secondary | ICD-10-CM | POA: Diagnosis not present

## 2019-02-20 DIAGNOSIS — Z6826 Body mass index (BMI) 26.0-26.9, adult: Secondary | ICD-10-CM | POA: Diagnosis not present

## 2019-02-22 DIAGNOSIS — I10 Essential (primary) hypertension: Secondary | ICD-10-CM | POA: Diagnosis not present

## 2019-10-03 DIAGNOSIS — E7849 Other hyperlipidemia: Secondary | ICD-10-CM | POA: Diagnosis not present

## 2019-10-03 DIAGNOSIS — M5432 Sciatica, left side: Secondary | ICD-10-CM | POA: Diagnosis not present

## 2019-10-03 DIAGNOSIS — Z0000 Encounter for general adult medical examination without abnormal findings: Secondary | ICD-10-CM | POA: Diagnosis not present

## 2019-10-03 DIAGNOSIS — Z6825 Body mass index (BMI) 25.0-25.9, adult: Secondary | ICD-10-CM | POA: Diagnosis not present

## 2019-10-03 DIAGNOSIS — Z131 Encounter for screening for diabetes mellitus: Secondary | ICD-10-CM | POA: Diagnosis not present

## 2019-10-03 DIAGNOSIS — M109 Gout, unspecified: Secondary | ICD-10-CM | POA: Diagnosis not present

## 2019-10-03 DIAGNOSIS — I10 Essential (primary) hypertension: Secondary | ICD-10-CM | POA: Diagnosis not present

## 2019-12-18 DIAGNOSIS — E7849 Other hyperlipidemia: Secondary | ICD-10-CM | POA: Diagnosis not present

## 2019-12-18 DIAGNOSIS — L03115 Cellulitis of right lower limb: Secondary | ICD-10-CM | POA: Diagnosis not present

## 2019-12-18 DIAGNOSIS — Z6825 Body mass index (BMI) 25.0-25.9, adult: Secondary | ICD-10-CM | POA: Diagnosis not present

## 2019-12-18 DIAGNOSIS — M109 Gout, unspecified: Secondary | ICD-10-CM | POA: Diagnosis not present

## 2019-12-18 DIAGNOSIS — Z0000 Encounter for general adult medical examination without abnormal findings: Secondary | ICD-10-CM | POA: Diagnosis not present

## 2019-12-18 DIAGNOSIS — I10 Essential (primary) hypertension: Secondary | ICD-10-CM | POA: Diagnosis not present

## 2020-01-14 DIAGNOSIS — Z96651 Presence of right artificial knee joint: Secondary | ICD-10-CM | POA: Diagnosis not present

## 2020-01-14 DIAGNOSIS — M25461 Effusion, right knee: Secondary | ICD-10-CM | POA: Diagnosis not present

## 2020-01-24 DIAGNOSIS — Z96652 Presence of left artificial knee joint: Secondary | ICD-10-CM | POA: Diagnosis not present

## 2020-01-24 DIAGNOSIS — M25461 Effusion, right knee: Secondary | ICD-10-CM | POA: Diagnosis not present

## 2020-01-24 DIAGNOSIS — Z96651 Presence of right artificial knee joint: Secondary | ICD-10-CM | POA: Diagnosis not present

## 2020-01-24 DIAGNOSIS — Z471 Aftercare following joint replacement surgery: Secondary | ICD-10-CM | POA: Diagnosis not present

## 2020-01-24 DIAGNOSIS — M25561 Pain in right knee: Secondary | ICD-10-CM | POA: Diagnosis not present

## 2020-01-27 DIAGNOSIS — M25461 Effusion, right knee: Secondary | ICD-10-CM | POA: Diagnosis not present

## 2020-02-13 ENCOUNTER — Encounter (HOSPITAL_COMMUNITY): Payer: Self-pay

## 2020-02-13 NOTE — Progress Notes (Signed)
PCP- Dr. Bjorn Loser clearance on chart Cardiologist -   PPM/ICD -  Device Orders -  Rep Notified -   Chest x-ray -  EKG - 02-20-20 epic Stress Test -  ECHO -  Cardiac Cath -   Sleep Study -  CPAP -   Fasting Blood Sugar -  Checks Blood Sugar _____ times a day  Blood Thinner Instructions: Aspirin Instructions:  ERAS Protcol - PRE-SURGERY Ensure or G2-   COVID TEST- 7-17   Anesthesia review: htn  Patient denies shortness of breath, fever, cough and chest pain at PAT     none    All instructions explained to the patient, with a verbal understanding of the material. Patient agrees to go over the instructions while at home for a better understanding. Patient also instructed to self quarantine after being tested for COVID-19. The opportunity to ask questions was provided.

## 2020-02-13 NOTE — Patient Instructions (Signed)
DUE TO COVID-19 ONLY ONE VISITOR IS ALLOWED TO COME WITH YOU AND STAY IN THE WAITING ROOM ONLY DURING PRE OP AND PROCEDURE DAY OF SURGERY. TWO  VISITOR MAY VISIT WITH YOU AFTER SURGERY IN YOUR PRIVATE ROOM DURING VISITING HOURS ONLY! 10a-8p  YOU NEED TO HAVE A COVID 19 TEST ON__7-17-21_____ @_______ , THIS TEST MUST BE DONE BEFORE SURGERY, COME  [address], [place] , .  [facility]) ONCE YOUR COVID TEST IS COMPLETED, PLEASE BEGIN THE QUARANTINE INSTRUCTIONS AS OUTLINED IN YOUR HANDOUT.                Kathleen Velazquez  02/13/2020   Your procedure is scheduled on: 02-26-20   Report to [facility] Main  Entrance   Report to admitting at      1145 AM     Call this number if you have problems the morning of surgery [phone]    Remember: NO SOLID FOOD AFTER MIDNIGHT THE NIGHT PRIOR TO SURGERY. NOTHING BY MOUTH EXCEPT CLEAR LIQUIDS UNTIL   11:15 am  . PLEASE FINISH ENSURE DRINK PER SURGEON ORDER  WHICH NEEDS TO BE COMPLETED AT      11:15 am then nothing by mouth .    CLEAR LIQUID DIET   Foods Allowed                                                                     Foods Excluded  Coffee and tea, regular and decaf                             liquids that you cannot  Plain Jell-O any favor except red or purple                                           see through such as: Fruit ices (not with fruit pulp)                                     milk, soups, orange juice  Iced Popsicles                                    All solid food Carbonated beverages, regular and diet                                    Cranberry, grape and apple juices Sports drinks like Gatorade Lightly seasoned clear broth or consume(fat free) Sugar, honey syrup   _____________________________________________________________________     BRUSH YOUR TEETH MORNING OF SURGERY AND RINSE YOUR MOUTH OUT, NO CHEWING GUM CANDY OR MINTS.     Take these medicines the morning of  surgery with A SIP OF WATER: Bactrim, metoprolol, lopid  You may not have any metal on your body including hair pins and              piercings  Do not wear jewelry, make-up, lotions, powders or perfumes, deodorant             Do not wear nail polish on your fingernails.  Do not shave  48 hours prior to surgery.     Do not bring valuables to the hospital. [facility] IS NOT             RESPONSIBLE   FOR VALUABLES.  Contacts, dentures or bridgework may not be worn into surgery.       Patients discharged the day of surgery will not be allowed to drive home. IF YOU ARE HAVING SURGERY AND GOING HOME THE SAME DAY, YOU MUST HAVE AN ADULT TO DRIVE YOU HOME AND BE WITH YOU FOR 24 HOURS. YOU MAY GO HOME BY TAXI OR UBER OR ORTHERWISE, BUT AN ADULT MUST ACCOMPANY YOU HOME AND STAY WITH YOU FOR 24 HOURS.  Name and phone number of your driver:  Special Instructions: N/A              Please read over the following fact sheets you were given: _____________________________________________________________________             [facility] - Preparing for Surgery Before surgery, you can play an important role.  Because skin is not sterile, your skin needs to be as free of germs as possible.  You can reduce the number of germs on your skin by washing with CHG (chlorahexidine gluconate) soap before surgery.  CHG is an antiseptic cleaner which kills germs and bonds with the skin to continue killing germs even after washing. Please DO NOT use if you have an allergy to CHG or antibacterial soaps.  If your skin becomes reddened/irritated stop using the CHG and inform your nurse when you arrive at Short Stay. Do not shave (including legs and underarms) for at least 48 hours prior to the first CHG shower.  You may shave your face/neck. Please follow these instructions carefully:  1.  Shower with CHG Soap the night before surgery and the  morning of Surgery.  2.  If you choose to  wash your hair, wash your hair first as usual with your  normal  shampoo.  3.  After you shampoo, rinse your hair and body thoroughly to remove the  shampoo.                           4.  Use CHG as you would any other liquid soap.  You can apply chg directly  to the skin and wash                       Gently with a scrungie or clean washcloth.  5.  Apply the CHG Soap to your body ONLY FROM THE NECK DOWN.   Do not use on face/ open                           Wound or open sores. Avoid contact with eyes, ears mouth and genitals (private parts).                       Wash face,  Genitals (private parts) with your normal soap.  6.  Wash thoroughly, paying special attention to the area where your surgery  will be performed.  7.  Thoroughly rinse your body with warm water from the neck down.  8.  DO NOT shower/wash with your normal soap after using and rinsing off  the CHG Soap.                9.  Pat yourself dry with a clean towel.            10.  Wear clean pajamas.            11.  Place clean sheets on your bed the night of your first shower and do not  sleep with pets. Day of Surgery : Do not apply any lotions/deodorants the morning of surgery.  Please wear clean clothes to the hospital/surgery center.  FAILURE TO FOLLOW THESE INSTRUCTIONS MAY RESULT IN THE CANCELLATION OF YOUR SURGERY PATIENT SIGNATURE_________________________________  NURSE SIGNATURE__________________________________  ________________________________________________________________________   Adam Phenix  An incentive spirometer is a tool that can help keep your lungs clear and active. This tool measures how well you are filling your lungs with each breath. Taking long deep breaths may help reverse or decrease the chance of developing breathing (pulmonary) problems (especially infection) following:  A long period of time when you are unable to move or be active. BEFORE THE PROCEDURE   If the  spirometer includes an indicator to show your best effort, your nurse or respiratory therapist will set it to a desired goal.  If possible, sit up straight or lean slightly forward. Try not to slouch.  Hold the incentive spirometer in an upright position. INSTRUCTIONS FOR USE  1. Sit on the edge of your bed if possible, or sit up as far as you can in bed or on a chair. 2. Hold the incentive spirometer in an upright position. 3. Breathe out normally. 4. Place the mouthpiece in your mouth and seal your lips tightly around it. 5. Breathe in slowly and as deeply as possible, raising the piston or the ball toward the top of the column. 6. Hold your breath for 3-5 seconds or for as long as possible. Allow the piston or ball to fall to the bottom of the column. 7. Remove the mouthpiece from your mouth and breathe out normally. 8. Rest for a few seconds and repeat Steps 1 through 7 at least 10 times every 1-2 hours when you are awake. Take your time and take a few normal breaths between deep breaths. 9. The spirometer may include an indicator to show your best effort. Use the indicator as a goal to work toward during each repetition. 10. After each set of 10 deep breaths, practice coughing to be sure your lungs are clear. If you have an incision (the cut made at the time of surgery), support your incision when coughing by placing a pillow or rolled up towels firmly against it. Once you are able to get out of bed, walk around indoors and cough well. You may stop using the incentive spirometer when instructed by your caregiver.  RISKS AND COMPLICATIONS  Take your time so you do not get dizzy or light-headed.  If you are in pain, you may need to take or ask for pain medication before doing incentive spirometry. It is harder to take a deep breath if you are having pain. AFTER USE  Rest and breathe slowly and easily.  It can be helpful to keep track of a log of your  progress. Your caregiver can provide  you with a simple table to help with this. If you are using the spirometer at home, follow these instructions: [place] IF:   You are having difficultly using the spirometer.  You have trouble using the spirometer as often as instructed.  Your pain medication is not giving enough relief while using the spirometer.  You develop fever of 100.5 F (38.1 C) or higher. SEEK IMMEDIATE MEDICAL CARE IF:   You cough up bloody sputum that had not been present before.  You develop fever of 102 F (38.9 C) or greater.  You develop worsening pain at or near the incision site. MAKE SURE YOU:   Understand these instructions.  Will watch your condition.  Will get help right away if you are not doing well or get worse. Document Released: 12/05/2006 Document Revised: 10/17/2011 Document Reviewed: 02/05/2007 [facility] Patient Information 2014 [place], [place].   ________________________________________________________________________

## 2020-02-20 ENCOUNTER — Other Ambulatory Visit: Payer: Self-pay

## 2020-02-20 ENCOUNTER — Encounter (HOSPITAL_COMMUNITY)
Admission: RE | Admit: 2020-02-20 | Discharge: 2020-02-20 | Disposition: A | Discharge status: Home / Self Care | Payer: Medicare HMO | Source: Ambulatory Visit | Attending: Orthopedic Surgery | Admitting: Orthopedic Surgery

## 2020-02-20 ENCOUNTER — Encounter (HOSPITAL_COMMUNITY): Payer: Self-pay

## 2020-02-20 DIAGNOSIS — T84018A Broken internal joint prosthesis, other site, initial encounter: Secondary | ICD-10-CM | POA: Insufficient documentation

## 2020-02-20 DIAGNOSIS — I10 Essential (primary) hypertension: Secondary | ICD-10-CM | POA: Insufficient documentation

## 2020-02-20 DIAGNOSIS — Z01818 Encounter for other preprocedural examination: Secondary | ICD-10-CM | POA: Insufficient documentation

## 2020-02-20 DIAGNOSIS — Z79899 Other long term (current) drug therapy: Secondary | ICD-10-CM | POA: Insufficient documentation

## 2020-02-20 DIAGNOSIS — X58XXXA Exposure to other specified factors, initial encounter: Secondary | ICD-10-CM | POA: Insufficient documentation

## 2020-02-20 DIAGNOSIS — Z7982 Long term (current) use of aspirin: Secondary | ICD-10-CM | POA: Insufficient documentation

## 2020-02-20 DIAGNOSIS — Z96659 Presence of unspecified artificial knee joint: Secondary | ICD-10-CM | POA: Insufficient documentation

## 2020-02-20 DIAGNOSIS — R9431 Abnormal electrocardiogram [ECG] [EKG]: Secondary | ICD-10-CM | POA: Insufficient documentation

## 2020-02-20 HISTORY — DX: Personal history of other diseases of the digestive system: Z87.19

## 2020-02-20 HISTORY — DX: Essential (primary) hypertension: I10

## 2020-02-20 LAB — COMPREHENSIVE METABOLIC PANEL
ALT: 14 U/L (ref 0–44)
AST: 15 U/L (ref 15–41)
Albumin: 3.9 g/dL (ref 3.5–5.0)
Alkaline Phosphatase: 127 U/L — ABNORMAL HIGH (ref 38–126)
Anion gap: 12 (ref 5–15)
BUN: 17 mg/dL (ref 8–23)
CO2: 23 mmol/L (ref 22–32)
Calcium: 9.3 mg/dL (ref 8.9–10.3)
Chloride: 102 mmol/L (ref 98–111)
Creatinine, Ser: 0.73 mg/dL (ref 0.44–1.00)
GFR calc Af Amer: >60 mL/min (ref >60)
GFR calc non Af Amer: >60 mL/min (ref >60)
Glucose, Bld: 91 mg/dL (ref 70–99)
Potassium: 4.9 mmol/L (ref 3.5–5.1)
Sodium: 137 mmol/L (ref 135–145)
Total Bilirubin: 0.5 mg/dL (ref 0.3–1.2)
Total Protein: 7.5 g/dL (ref 6.5–8.1)

## 2020-02-20 LAB — CBC
HCT: 40.7 % (ref 36.0–46.0)
Hemoglobin: 13.1 g/dL (ref 12.0–15.0)
MCH: 32.4 pg (ref 26.0–34.0)
MCHC: 32.2 g/dL (ref 30.0–36.0)
MCV: 100.7 fL — ABNORMAL HIGH (ref 80.0–100.0)
Platelets: 217 10*3/uL (ref 150–400)
RBC: 4.04 MIL/uL (ref 3.87–5.11)
RDW: 12.7 % (ref 11.5–15.5)
WBC: 7.4 10*3/uL (ref 4.0–10.5)
nRBC: 0 % (ref 0.0–0.2)

## 2020-02-20 LAB — APTT: aPTT: 27 seconds (ref 24–36)

## 2020-02-20 LAB — PROTIME-INR
INR: 1 (ref 0.8–1.2)
Prothrombin Time: 13 seconds (ref 11.4–15.2)

## 2020-02-20 LAB — SURGICAL PCR SCREEN
MRSA, PCR: NEGATIVE
Staphylococcus aureus: NEGATIVE

## 2020-02-20 NOTE — Anesthesia Preprocedure Evaluation (Addendum)
Anesthesia Evaluation  Patient identified by MRN, date of birth, ID band Patient awake    Reviewed: Allergy & Precautions, NPO status , Patient's Chart, lab work & pertinent test results  History of Anesthesia Complications (+) PONV and history of anesthetic complications  Airway Mallampati: II  TM Distance: >3 FB Neck ROM: Full    Dental no notable dental hx. (+) Teeth Intact   Pulmonary neg pulmonary ROS,    Pulmonary exam normal breath sounds clear to auscultation       Cardiovascular hypertension, Pt. on medications Normal cardiovascular exam+ dysrhythmias Supra Ventricular Tachycardia  Rhythm:Regular Rate:Normal  Normal EF Echo 2017   Neuro/Psych  Headaches, negative psych ROS   GI/Hepatic Neg liver ROS, hiatal hernia, GERD  Medicated and Controlled,  Endo/Other  Hyperlipidemia  Renal/GU negative Renal ROS  negative genitourinary   Musculoskeletal  (+) Arthritis , Osteoarthritis,  Failed Right total knee replacement   Abdominal   Peds  Hematology  (+) anemia ,   Anesthesia Other Findings   Reproductive/Obstetrics                           Anesthesia Physical Anesthesia Plan  ASA: II  Anesthesia Plan: General   Post-op Pain Management:    Induction: Intravenous, Cricoid pressure planned and Rapid sequence  PONV Risk Score and Plan: 4 or greater and Ondansetron, Dexamethasone, Midazolam and Treatment may vary due to age or medical condition  Airway Management Planned: Oral ETT  Additional Equipment:   Intra-op Plan:   Post-operative Plan: Extubation in OR  Informed Consent: I have reviewed the patients History and Physical, chart, labs and discussed the procedure including the risks, benefits and alternatives for the proposed anesthesia with the patient or authorized representative who has indicated his/her understanding and acceptance.     Dental advisory given  Plan  Discussed with: CRNA, Anesthesiologist and Surgeon  Anesthesia Plan Comments: (See APP note by Joslyn Hy, FNP  Patient refuses SAB. Will proceed with GETA.)       Anesthesia Quick Evaluation

## 2020-02-20 NOTE — Progress Notes (Signed)
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  EKG shows lateral ST and T wave abnormality. Pt denies CP, SOB.  States does feel "tired" when walking from living room to bathroom (up 2 steps) at home.  Reports was able to "walk all over [employer]" without CV symptoms until ~ a month ago when her knee began to hurt.  Lungs CTA B, HRRR, no murmurs, no peripheral edema.   Reviewed EKGs with Dr. Maple Hudson.    VS: BP 137/76   Pulse 80   Temp 36.9 C (Oral)   Resp 16   Ht 5\' 2"  (1.575 m)   Wt 63.5 kg   SpO2 99%   BMI 25.61 kg/m    PROVIDERS: - PCP is , MD   LABS: Labs reviewed: Acceptable for surgery. (all labs ordered are listed, but only abnormal results are displayed)  Labs Reviewed  CBC - Abnormal; Notable for the following components:      Result Value   MCV 100.7 (*)    All other components within normal limits  COMPREHENSIVE METABOLIC PANEL - Abnormal; Notable for the following components:   Alkaline Phosphatase 127 (*)    All other components within normal limits  SURGICAL PCR SCREEN  APTT  PROTIME-INR  TYPE AND SCREEN    EKG 02/20/20: NSR. ST & T wave abnormality, consider lateral ischemia   CV: Echo 07/29/16:  - Left ventricle: The cavity size was normal. Wall thickness was normal. Systolic function was normal. The estimated ejection fraction was in the range of 55% to 60%. Wall motion was normal; there were no regional wall motion abnormalities.  - Aortic valve: Mildly calcified  annulus. Trileaflet.  - Mitral valve: Mildly thickened leaflets . There was trivial regurgitation.  - Right ventricle: Systolic function was mildly reduced.  - Tricuspid valve: There was mild regurgitation.  - Pulmonary arteries: PA peak pressure: 26 mm Hg (S).  - Pericardium, extracardiac: A trivial pericardial effusion was identified. There was a left pleural effusion.  - Impressions: Normal LV wall thickness with LVEF 55-60%. Indeterminate diastolic function. Trivial mitral regurgitation. Mildly reduced right ventricular contraction. Mild tricuspid regurgitation with PASP 26 mmHg. Trivial pericardial effusion. Left pleural effusion noted.    Past Medical History:  Diagnosis Date  . Anxiety   . Arthritis    knees,   . Ejection fraction    Normal, echo, June, 2013  . GERD (gastroesophageal reflux disease)   . Headache(784.0)    hx of migraines   . History of hiatal hernia   . Hypertension   . Irritable bowel syndrome (IBS)   . PONV (postoperative nausea and vomiting)   . Sinus tachycardia    Persistent sinus tachycardia, June, 2013 ( prior monitor in 2007 had shown no significant abnormalities.)    Past Surgical History:  Procedure Laterality Date  . CHOLECYSTECTOMY    . ESOPHAGOGASTRODUODENOSCOPY N/A 08/19/2016   Procedure: ESOPHAGOGASTRODUODENOSCOPY (EGD);  Surgeon: 10/17/2016, MD;  Location: [facility];  Service: Endoscopy;  Laterality: N/A;  2:50  . EYE SURGERY     cataract  . HEMORRHOID SURGERY N/A 05/20/2013   Procedure: EXTENSIVE HEMORRHOIDECTOMY;  Surgeon: Dalia Heading, MD;  Location: [facility];  Service: General;  Laterality: N/A;  . JOINT REPLACEMENT     right knee replacement   . OTHER SURGICAL HISTORY     left breast biopsy - benign   . OTHER SURGICAL HISTORY     hx of skin cancer surgery   . TOTAL KNEE ARTHROPLASTY  11/28/2011   Procedure: TOTAL KNEE ARTHROPLASTY;  Surgeon: Loanne Drilling, MD;  Location: [facility];  Service: Orthopedics;  Laterality: Left;   . TUBAL LIGATION      MEDICATIONS: . albuterol (PROVENTIL) (2.5 MG/3ML) 0.083% nebulizer solution  . aspirin EC 81 MG tablet  . gemfibrozil (LOPID) 600 MG tablet  . loperamide (IMODIUM) 2 MG capsule  . magnesium oxide (MAG-OX) 400 (241.3 Mg) MG tablet  . metoprolol tartrate (LOPRESSOR) 25 MG tablet  . potassium chloride (K-DUR) 10 MEQ tablet  . sulfamethoxazole-trimethoprim (BACTRIM DS) 800-160 MG tablet   No current facility-administered medications for this encounter.    If no changes, I anticipate pt can proceed with surgery as scheduled.   Rica Mast, PhD, FNP-BC [facility]/Anesthesiology Phone: [phone] 02/20/2020 2:44 PM

## 2020-02-22 ENCOUNTER — Other Ambulatory Visit (HOSPITAL_COMMUNITY)
Admission: RE | Admit: 2020-02-22 | Discharge: 2020-02-22 | Disposition: A | Discharge status: Home / Self Care | Payer: Medicare HMO | Source: Ambulatory Visit | Attending: Orthopedic Surgery | Admitting: Orthopedic Surgery

## 2020-02-22 DIAGNOSIS — Z20822 Contact with and (suspected) exposure to covid-19: Secondary | ICD-10-CM | POA: Diagnosis not present

## 2020-02-22 DIAGNOSIS — Z01812 Encounter for preprocedural laboratory examination: Secondary | ICD-10-CM | POA: Insufficient documentation

## 2020-02-22 LAB — SARS CORONAVIRUS 2 (TAT 6-24 HRS): SARS Coronavirus 2: NEGATIVE

## 2020-02-25 MED ORDER — BUPIVACAINE LIPOSOME 1.3 % IJ SUSP
20.0000 mL | Freq: Once | INTRAMUSCULAR | Status: DC
  Filled 2020-02-25: qty 20

## 2020-02-25 NOTE — H&P (Signed)
TOTAL KNEE REVISION ADMISSION H&P  Patient is being admitted for right revision total knee arthroplasty.  Subjective:  Chief Complaint:right knee pain.  HPI: Kathleen Velazquez, 64 y.o. female, has a history of pain and functional disability in the right knee(s) due to failed previous arthroplasty and patient has failed non-surgical conservative treatments for greater than 12 weeks to include use of assistive devices and activity modification. The indications for the revision of the total knee arthroplasty are bearing surface wear leading to symptomatic synovitis. Onset of symptoms was abrupt starting 3 months ago with stable course since that time.  Prior procedures on the right knee(s) include arthroplasty performed approximately 30 years ago in [place].  Patient currently rates pain in the right knee(s) at 7 out of 10 with activity. There is joint swelling.  There is no current active infection.  Patient Active Problem List   Diagnosis Date Noted  . Abnormal CT of the abdomen 08/10/2016  . Hypokalemia 07/27/2016  . Generalized weakness 07/27/2016  . Hypomagnesemia 07/27/2016  . SVT (supraventricular tachycardia) (HCC) 07/27/2016  . Diarrhea 07/27/2016  . IBS (irritable bowel syndrome) 07/27/2016  . GERD (gastroesophageal reflux disease)   . Headache(784.0)   . Arthritis   . Anxiety   . Ejection fraction   . Sinus tachycardia   . Palpitations 01/16/2012  . Postop Acute blood loss anemia 11/29/2011  . OA (osteoarthritis) of knee 11/28/2011   Past Medical History:  Diagnosis Date  . Anxiety   . Arthritis    knees,   . Ejection fraction    Normal, echo, June, 2013  . GERD (gastroesophageal reflux disease)   . Headache(784.0)    hx of migraines   . History of hiatal hernia   . Hypertension   . Irritable bowel syndrome (IBS)   . PONV (postoperative nausea and vomiting)   . Sinus tachycardia    Persistent sinus tachycardia, June, 2013 ( prior monitor in 2007 had shown no significant  abnormalities.)    Past Surgical History:  Procedure Laterality Date  . CHOLECYSTECTOMY    . ESOPHAGOGASTRODUODENOSCOPY N/A 08/19/2016   Procedure: ESOPHAGOGASTRODUODENOSCOPY (EGD);  Surgeon: Malissa Hippo, MD;  Location[facility];  Service: Endoscopy;  Laterality: N/A;  2:50  . EYE SURGERY     cataract  . HEMORRHOID SURGERY N/A 05/20/2013   Procedure: EXTENSIVE HEMORRHOIDECTOMY;  Surgeon: Dalia Heading, MD;  Location: [facility];  Service: General;  Laterality: N/A;  . JOINT REPLACEMENT     right knee replacement   . OTHER SURGICAL HISTORY     left breast biopsy - benign   . OTHER SURGICAL HISTORY     hx of skin cancer surgery   . TOTAL KNEE ARTHROPLASTY  11/28/2011   Procedure: TOTAL KNEE ARTHROPLASTY;  Surgeon: Loanne Drilling, MD;  Location: [facility];  Service: Orthopedics;  Laterality: Left;  . TUBAL LIGATION      Current Facility-Administered Medications  Medication Dose Route Frequency Provider Last Rate Last Admin  . [START ON 02/26/2020] bupivacaine liposome (EXPAREL) 1.3 % injection 266 mg  20 mL Other Once Cindi Carbon, [facility]       Current Outpatient Medications  Medication Sig Dispense Refill Last Dose  . gemfibrozil (LOPID) 600 MG tablet Take 600 mg by mouth 2 (two) times daily before a meal.     . metoprolol tartrate (LOPRESSOR) 25 MG tablet Take 25 mg by mouth 2 (two) times daily.     Marland Kitchen sulfamethoxazole-trimethoprim (BACTRIM DS) 800-160 MG tablet  Take 1 tablet by mouth 2 (two) times daily.     Marland Kitchen albuterol (PROVENTIL) (2.5 MG/3ML) 0.083% nebulizer solution Take 3 mLs (2.5 mg total) by nebulization every 4 (four) hours as needed for wheezing or shortness of breath. (Patient not taking: Reported on 02/07/2020) 75 mL 0 Not Taking at Unknown time  . aspirin EC 81 MG tablet Take 1 tablet (81 mg total) by mouth daily. (Patient not taking: Reported on 02/07/2020) 30 tablet 0 Not Taking at Unknown time  . loperamide (IMODIUM) 2 MG capsule Take 1 capsule (2 mg total) by mouth every  morning. (Patient not taking: Reported on 02/07/2020) 30 capsule 0 Not Taking at Unknown time  . magnesium oxide (MAG-OX) 400 (241.3 Mg) MG tablet Take 1 tablet (400 mg total) by mouth daily. (Patient not taking: Reported on 02/07/2020) 30 tablet 0 Not Taking at Unknown time  . potassium chloride (K-DUR) 10 MEQ tablet Take 1 tablet (10 mEq total) by mouth daily. (Patient not taking: Reported on 02/07/2020) 30 tablet 0 Not Taking at Unknown time   Allergies  Allergen Reactions  . Codeine Nausea And Vomiting    Social History   Tobacco Use  . Smoking status: Never Smoker  . Smokeless tobacco: Never Used  Substance Use Topics  . Alcohol use: Not Currently    Comment: recovering alcoholic x 7 years     Family History  Problem Relation Age of Onset  . Irritable bowel syndrome Sister       Review of Systems  Constitutional: Negative for chills and fever.  HENT: Negative for congestion, sore throat and tinnitus.   Eyes: Negative for photophobia and pain.  Respiratory: Negative for cough, shortness of breath and wheezing.   Cardiovascular: Negative for chest pain and palpitations.  Gastrointestinal: Negative for nausea and vomiting.  Genitourinary: Negative for dysuria, frequency and urgency.  Neurological: Negative for dizziness, weakness and headaches.     Objective:  Physical Exam Well nourished and well developed.  General: Alert and oriented x3, cooperative and pleasant, no acute distress.  Head: normocephalic, atraumatic, neck supple.  Eyes: EOMI.  Respiratory: breath sounds clear in all fields, no wheezing, rales, or rhonchi. Cardiovascular: Regular rate and rhythm, no murmurs, gallops or rubs.  Abdomen: non-tender to palpation and soft, normoactive bowel sounds. Musculoskeletal:  Right knee exam:  No evidence of any cellulitis or erythema about the knee.  There is an effusion.  The range of motion: 0 to 90 degrees with pain on range of motion. It is not a firm endpoint, but  she is just resisting secondary to pain.  Slight varus/valgus laxity and AP laxity.   Calves soft and nontender. Motor function intact in LE. Strength 5/5 LE bilaterally. Neuro: Distal pulses 2+. Sensation to light touch intact in LE.   Labs:  Estimated body mass index is 25.61 kg/m as calculated from the following:   Height as of 02/20/20: 5\' 2"  (1.575 m).   Weight as of 02/20/20: 63.5 kg.  Imaging Review Radiographs- AP and lateral of the bilateral knees dated 01/24/2020 demonstrate the prosthesis on the left is in excellent position with no periprosthetic abnormalities. On the right, she has a prosthesis in good position, but it looks like the polyethylene is close to being fully worn. No evidence of any obvious loosening. Her alignment looks fine.     Assessment/Plan:  End stage arthritis, right knee(s) with failed previous arthroplasty.   The patient history, physical examination, clinical judgment of the provider and imaging studies  are consistent with end stage degenerative joint disease of the right knee(s), previous total knee arthroplasty. Revision total knee arthroplasty is deemed medically necessary. The treatment options including medical management, injection therapy, arthroscopy and revision arthroplasty were discussed at length. The risks and benefits of revision total knee arthroplasty were presented and reviewed. The risks due to aseptic loosening, infection, stiffness, patella tracking problems, thromboembolic complications and other imponderables were discussed. The patient acknowledged the explanation, agreed to proceed with the plan and consent was signed. Patient is being admitted for inpatient treatment for surgery, pain control, PT, OT, prophylactic antibiotics, VTE prophylaxis, progressive ambulation and ADL's and discharge planning.The patient is planning to be discharged home   Therapy Plans: Outpatient therapy at [facility] in [place] Disposition: Home with family  nearby Planned DVT Prophylaxis: Aspirin 325 mg BID DME Needed: None PCP: Lia Hopping, MD (clearance provided) TXA: IV Allergies: Codeine (vomiting) Anesthesia Concerns: None BMI: 25.6 Last HgbA1c: Not diabetic  Other: - Tachycardia following original TKA, and required blood transfusion  - Patient was instructed on what medications to stop prior to surgery. - Follow-up visit in 2 weeks with Dr. Lequita Halt - Begin physical therapy following surgery - Pre-operative lab work as pre-surgical testing - Prescriptions will be provided in hospital at time of discharge  Arther Abbott, PA-C Orthopedic Surgery [facility] [place]

## 2020-02-26 ENCOUNTER — Ambulatory Visit (HOSPITAL_COMMUNITY): Payer: Medicare HMO | Admitting: Certified Registered"

## 2020-02-26 ENCOUNTER — Encounter (HOSPITAL_COMMUNITY)
Admission: RE | Disposition: A | Discharge status: Home / Self Care | Payer: Self-pay | Source: Other Acute Inpatient Hospital | Attending: Orthopedic Surgery

## 2020-02-26 ENCOUNTER — Ambulatory Visit (HOSPITAL_COMMUNITY): Payer: Medicare HMO | Admitting: Emergency Medicine

## 2020-02-26 ENCOUNTER — Other Ambulatory Visit: Payer: Self-pay

## 2020-02-26 ENCOUNTER — Encounter (HOSPITAL_COMMUNITY): Payer: Self-pay | Admitting: Orthopedic Surgery

## 2020-02-26 ENCOUNTER — Observation Stay (HOSPITAL_COMMUNITY)
Admission: RE | Admit: 2020-02-26 | Discharge: 2020-02-28 | Disposition: A | Discharge status: Home / Self Care | Payer: Medicare HMO | Source: Other Acute Inpatient Hospital | Attending: Orthopedic Surgery | Admitting: Orthopedic Surgery

## 2020-02-26 DIAGNOSIS — Z79899 Other long term (current) drug therapy: Secondary | ICD-10-CM | POA: Insufficient documentation

## 2020-02-26 DIAGNOSIS — I10 Essential (primary) hypertension: Secondary | ICD-10-CM | POA: Diagnosis not present

## 2020-02-26 DIAGNOSIS — T84012D Broken internal right knee prosthesis, subsequent encounter: Secondary | ICD-10-CM

## 2020-02-26 DIAGNOSIS — T84062A Wear of articular bearing surface of internal prosthetic right knee joint, initial encounter: Secondary | ICD-10-CM | POA: Diagnosis not present

## 2020-02-26 DIAGNOSIS — G8918 Other acute postprocedural pain: Secondary | ICD-10-CM | POA: Diagnosis not present

## 2020-02-26 DIAGNOSIS — T84012A Broken internal right knee prosthesis, initial encounter: Secondary | ICD-10-CM

## 2020-02-26 DIAGNOSIS — K219 Gastro-esophageal reflux disease without esophagitis: Secondary | ICD-10-CM | POA: Diagnosis not present

## 2020-02-26 DIAGNOSIS — T84018A Broken internal joint prosthesis, other site, initial encounter: Secondary | ICD-10-CM

## 2020-02-26 DIAGNOSIS — Z96651 Presence of right artificial knee joint: Secondary | ICD-10-CM | POA: Diagnosis not present

## 2020-02-26 DIAGNOSIS — Z96659 Presence of unspecified artificial knee joint: Secondary | ICD-10-CM

## 2020-02-26 DIAGNOSIS — T84022A Instability of internal right knee prosthesis, initial encounter: Secondary | ICD-10-CM | POA: Diagnosis not present

## 2020-02-26 DIAGNOSIS — T84090A Other mechanical complication of internal right hip prosthesis, initial encounter: Secondary | ICD-10-CM | POA: Diagnosis not present

## 2020-02-26 HISTORY — PX: TOTAL KNEE REVISION: SHX996

## 2020-02-26 LAB — TYPE AND SCREEN
ABO/RH(D): O POS
Antibody Screen: NEGATIVE

## 2020-02-26 SURGERY — TOTAL KNEE REVISION
Anesthesia: General | Site: Knee | Laterality: Right

## 2020-02-26 MED ORDER — ONDANSETRON HCL 4 MG/2ML IJ SOLN
INTRAMUSCULAR | Status: DC | PRN
  Administered 2020-02-26: 4 mg via INTRAVENOUS

## 2020-02-26 MED ORDER — PHENOL 1.4 % MT LIQD: 1.0000 | OROMUCOSAL | Status: DC | PRN

## 2020-02-26 MED ORDER — HYDROMORPHONE HCL 1 MG/ML IJ SOLN
INTRAMUSCULAR | Status: AC
  Administered 2020-02-26: 0.5 mg via INTRAVENOUS
  Filled 2020-02-26: qty 1

## 2020-02-26 MED ORDER — PHENYLEPHRINE HCL-NACL 10-0.9 MG/250ML-% IV SOLN
INTRAVENOUS | Status: DC | PRN
  Administered 2020-02-26: 25 ug/min via INTRAVENOUS

## 2020-02-26 MED ORDER — POVIDONE-IODINE 10 % EX SWAB
2.0000 "application " | Freq: Once | CUTANEOUS | Status: AC
  Administered 2020-02-26: 2 via TOPICAL

## 2020-02-26 MED ORDER — PROPOFOL 10 MG/ML IV BOLUS
INTRAVENOUS | Status: AC
  Filled 2020-02-26: qty 20

## 2020-02-26 MED ORDER — ONDANSETRON HCL 4 MG/2ML IJ SOLN: 4.0000 mg | Freq: Once | INTRAMUSCULAR | Status: DC | PRN

## 2020-02-26 MED ORDER — HYDROMORPHONE HCL 1 MG/ML IJ SOLN
INTRAMUSCULAR | Status: DC | PRN
  Administered 2020-02-26 (×2): .4 mg via INTRAVENOUS
  Administered 2020-02-26: .2 mg via INTRAVENOUS

## 2020-02-26 MED ORDER — FENTANYL CITRATE (PF) 100 MCG/2ML IJ SOLN
25.0000 ug | Freq: Once | INTRAMUSCULAR | Status: AC
  Administered 2020-02-26: 100 ug via INTRAVENOUS
  Filled 2020-02-26: qty 2

## 2020-02-26 MED ORDER — MIDAZOLAM HCL 2 MG/2ML IJ SOLN
1.0000 mg | Freq: Once | INTRAMUSCULAR | Status: AC
  Administered 2020-02-26: 2 mg via INTRAVENOUS
  Filled 2020-02-26: qty 2

## 2020-02-26 MED ORDER — SODIUM CHLORIDE (PF) 0.9 % IJ SOLN
INTRAMUSCULAR | Status: AC
  Filled 2020-02-26: qty 10

## 2020-02-26 MED ORDER — METHOCARBAMOL 500 MG IVPB - SIMPLE MED
INTRAVENOUS | Status: AC
  Filled 2020-02-26: qty 50

## 2020-02-26 MED ORDER — SODIUM CHLORIDE 0.9 % IR SOLN
Status: DC | PRN
  Administered 2020-02-26: 3000 mL

## 2020-02-26 MED ORDER — ONDANSETRON HCL 4 MG PO TABS: 4.0000 mg | ORAL_TABLET | Freq: Four times a day (QID) | ORAL | Status: DC | PRN

## 2020-02-26 MED ORDER — SUCCINYLCHOLINE CHLORIDE 200 MG/10ML IV SOSY
PREFILLED_SYRINGE | INTRAVENOUS | Status: AC
  Filled 2020-02-26: qty 10

## 2020-02-26 MED ORDER — MIDAZOLAM HCL 5 MG/5ML IJ SOLN
INTRAMUSCULAR | Status: DC | PRN
  Administered 2020-02-26: .5 mg via INTRAVENOUS

## 2020-02-26 MED ORDER — ROPIVACAINE HCL 7.5 MG/ML IJ SOLN
INTRAMUSCULAR | Status: DC | PRN
  Administered 2020-02-26: 20 mL via PERINEURAL

## 2020-02-26 MED ORDER — ROCURONIUM BROMIDE 10 MG/ML (PF) SYRINGE
PREFILLED_SYRINGE | INTRAVENOUS | Status: AC
  Filled 2020-02-26: qty 10

## 2020-02-26 MED ORDER — ROCURONIUM BROMIDE 10 MG/ML (PF) SYRINGE
PREFILLED_SYRINGE | INTRAVENOUS | Status: DC | PRN
  Administered 2020-02-26: 50 mg via INTRAVENOUS

## 2020-02-26 MED ORDER — CHLORHEXIDINE GLUCONATE 0.12 % MT SOLN
15.0000 mL | Freq: Once | OROMUCOSAL | Status: AC
  Administered 2020-02-26: 15 mL via OROMUCOSAL

## 2020-02-26 MED ORDER — LACTATED RINGERS IV SOLN: INTRAVENOUS | Status: DC

## 2020-02-26 MED ORDER — TRAMADOL HCL 50 MG PO TABS
50.0000 mg | ORAL_TABLET | Freq: Four times a day (QID) | ORAL | Status: DC | PRN
  Filled 2020-02-26: qty 1

## 2020-02-26 MED ORDER — METOCLOPRAMIDE HCL 5 MG/ML IJ SOLN: 5.0000 mg | Freq: Three times a day (TID) | INTRAMUSCULAR | Status: DC | PRN

## 2020-02-26 MED ORDER — DEXAMETHASONE SODIUM PHOSPHATE 10 MG/ML IJ SOLN
10.0000 mg | Freq: Once | INTRAMUSCULAR | Status: AC
  Administered 2020-02-27: 10 mg via INTRAVENOUS
  Filled 2020-02-26: qty 1

## 2020-02-26 MED ORDER — PROPOFOL 10 MG/ML IV BOLUS
INTRAVENOUS | Status: DC | PRN
  Administered 2020-02-26: 150 mg via INTRAVENOUS

## 2020-02-26 MED ORDER — HYDROMORPHONE HCL 2 MG/ML IJ SOLN
INTRAMUSCULAR | Status: AC
  Filled 2020-02-26: qty 1

## 2020-02-26 MED ORDER — TRANEXAMIC ACID-NACL 1000-0.7 MG/100ML-% IV SOLN
1000.0000 mg | INTRAVENOUS | Status: AC
  Administered 2020-02-26: 14:00:00 1000 mg via INTRAVENOUS
  Filled 2020-02-26: qty 100

## 2020-02-26 MED ORDER — ASPIRIN EC 325 MG PO TBEC
325.0000 mg | DELAYED_RELEASE_TABLET | Freq: Two times a day (BID) | ORAL | Status: DC
  Administered 2020-02-27 – 2020-02-28 (×3): 325 mg via ORAL
  Filled 2020-02-26 (×3): qty 1

## 2020-02-26 MED ORDER — BISACODYL 10 MG RE SUPP: 10.0000 mg | Freq: Every day | RECTAL | Status: DC | PRN

## 2020-02-26 MED ORDER — LIDOCAINE 2% (20 MG/ML) 5 ML SYRINGE
INTRAMUSCULAR | Status: DC | PRN
  Administered 2020-02-26: 60 mg via INTRAVENOUS

## 2020-02-26 MED ORDER — MORPHINE SULFATE (PF) 2 MG/ML IV SOLN: 0.5000 mg | INTRAVENOUS | Status: DC | PRN

## 2020-02-26 MED ORDER — SODIUM CHLORIDE (PF) 0.9 % IJ SOLN
INTRAMUSCULAR | Status: DC | PRN
  Administered 2020-02-26: 60 mL

## 2020-02-26 MED ORDER — FLEET ENEMA 7-19 GM/118ML RE ENEM: 1.0000 | ENEMA | Freq: Once | RECTAL | Status: DC | PRN

## 2020-02-26 MED ORDER — FENTANYL CITRATE (PF) 250 MCG/5ML IJ SOLN
INTRAMUSCULAR | Status: AC
  Filled 2020-02-26: qty 5

## 2020-02-26 MED ORDER — MEPERIDINE HCL 50 MG/ML IJ SOLN: 6.2500 mg | INTRAMUSCULAR | Status: DC | PRN

## 2020-02-26 MED ORDER — METOCLOPRAMIDE HCL 5 MG PO TABS: 5.0000 mg | ORAL_TABLET | Freq: Three times a day (TID) | ORAL | Status: DC | PRN

## 2020-02-26 MED ORDER — BUPIVACAINE LIPOSOME 1.3 % IJ SUSP
INTRAMUSCULAR | Status: DC | PRN
  Administered 2020-02-26: 20 mL

## 2020-02-26 MED ORDER — DIPHENHYDRAMINE HCL 12.5 MG/5ML PO ELIX
12.5000 mg | ORAL_SOLUTION | ORAL | Status: DC | PRN
  Administered 2020-02-27: 12.5 mg via ORAL
  Administered 2020-02-27: 25 mg via ORAL
  Filled 2020-02-26: qty 5
  Filled 2020-02-26: qty 10

## 2020-02-26 MED ORDER — CEFAZOLIN SODIUM-DEXTROSE 2-4 GM/100ML-% IV SOLN
2.0000 g | Freq: Four times a day (QID) | INTRAVENOUS | Status: AC
  Administered 2020-02-26 – 2020-02-27 (×2): 2 g via INTRAVENOUS
  Filled 2020-02-26 (×2): qty 100

## 2020-02-26 MED ORDER — ONDANSETRON HCL 4 MG/2ML IJ SOLN
INTRAMUSCULAR | Status: AC
  Filled 2020-02-26: qty 2

## 2020-02-26 MED ORDER — MIDAZOLAM HCL 2 MG/2ML IJ SOLN
INTRAMUSCULAR | Status: AC
  Filled 2020-02-26: qty 2

## 2020-02-26 MED ORDER — OXYCODONE HCL 5 MG PO TABS: 5.0000 mg | ORAL_TABLET | Freq: Once | ORAL | Status: DC | PRN

## 2020-02-26 MED ORDER — GABAPENTIN 300 MG PO CAPS
300.0000 mg | ORAL_CAPSULE | Freq: Three times a day (TID) | ORAL | Status: DC
  Administered 2020-02-26 – 2020-02-28 (×5): 300 mg via ORAL
  Filled 2020-02-26 (×5): qty 1

## 2020-02-26 MED ORDER — SUCCINYLCHOLINE CHLORIDE 200 MG/10ML IV SOSY
PREFILLED_SYRINGE | INTRAVENOUS | Status: DC | PRN
  Administered 2020-02-26: 100 mg via INTRAVENOUS

## 2020-02-26 MED ORDER — HYDROMORPHONE HCL 1 MG/ML IJ SOLN
0.2500 mg | INTRAMUSCULAR | Status: DC | PRN
  Administered 2020-02-26: 0.5 mg via INTRAVENOUS

## 2020-02-26 MED ORDER — FENTANYL CITRATE (PF) 100 MCG/2ML IJ SOLN
INTRAMUSCULAR | Status: DC | PRN
  Administered 2020-02-26 (×4): 50 ug via INTRAVENOUS

## 2020-02-26 MED ORDER — ORAL CARE MOUTH RINSE: 15.0000 mL | Freq: Once | OROMUCOSAL | Status: AC

## 2020-02-26 MED ORDER — METHOCARBAMOL 500 MG PO TABS
500.0000 mg | ORAL_TABLET | Freq: Four times a day (QID) | ORAL | Status: DC | PRN
  Administered 2020-02-27 – 2020-02-28 (×3): 500 mg via ORAL
  Filled 2020-02-26 (×3): qty 1

## 2020-02-26 MED ORDER — HYDROMORPHONE HCL 2 MG PO TABS
2.0000 mg | ORAL_TABLET | ORAL | Status: DC | PRN
  Administered 2020-02-27 (×3): 2 mg via ORAL
  Administered 2020-02-28: 4 mg via ORAL
  Administered 2020-02-28: 2 mg via ORAL
  Administered 2020-02-28: 4 mg via ORAL
  Filled 2020-02-26: qty 2
  Filled 2020-02-26 (×4): qty 1
  Filled 2020-02-26: qty 2

## 2020-02-26 MED ORDER — DEXAMETHASONE SODIUM PHOSPHATE 10 MG/ML IJ SOLN
8.0000 mg | Freq: Once | INTRAMUSCULAR | Status: AC
  Administered 2020-02-26: 14:00:00 10 mg via INTRAVENOUS

## 2020-02-26 MED ORDER — CEFAZOLIN SODIUM-DEXTROSE 2-4 GM/100ML-% IV SOLN
2.0000 g | INTRAVENOUS | Status: AC
  Administered 2020-02-26: 14:00:00 2 g via INTRAVENOUS
  Filled 2020-02-26: qty 100

## 2020-02-26 MED ORDER — PROPOFOL 500 MG/50ML IV EMUL
INTRAVENOUS | Status: DC | PRN
  Administered 2020-02-26: 20 ug/kg/min via INTRAVENOUS

## 2020-02-26 MED ORDER — DOCUSATE SODIUM 100 MG PO CAPS
100.0000 mg | ORAL_CAPSULE | Freq: Two times a day (BID) | ORAL | Status: DC
  Administered 2020-02-26 – 2020-02-28 (×4): 100 mg via ORAL
  Filled 2020-02-26 (×4): qty 1

## 2020-02-26 MED ORDER — METOPROLOL TARTRATE 25 MG PO TABS
25.0000 mg | ORAL_TABLET | Freq: Two times a day (BID) | ORAL | Status: DC
  Administered 2020-02-27 – 2020-02-28 (×3): 25 mg via ORAL
  Filled 2020-02-26 (×3): qty 1

## 2020-02-26 MED ORDER — ACETAMINOPHEN 10 MG/ML IV SOLN
1000.0000 mg | Freq: Once | INTRAVENOUS | Status: AC
  Administered 2020-02-26: 14:00:00 1000 mg via INTRAVENOUS
  Filled 2020-02-26: qty 100

## 2020-02-26 MED ORDER — GEMFIBROZIL 600 MG PO TABS
600.0000 mg | ORAL_TABLET | Freq: Two times a day (BID) | ORAL | Status: DC
  Administered 2020-02-27 – 2020-02-28 (×3): 600 mg via ORAL
  Filled 2020-02-26 (×4): qty 1

## 2020-02-26 MED ORDER — ONDANSETRON HCL 4 MG/2ML IJ SOLN
4.0000 mg | Freq: Four times a day (QID) | INTRAMUSCULAR | Status: DC | PRN
  Administered 2020-02-26: 4 mg via INTRAVENOUS
  Filled 2020-02-26: qty 2

## 2020-02-26 MED ORDER — SODIUM CHLORIDE 0.9 % IV SOLN: INTRAVENOUS | Status: DC

## 2020-02-26 MED ORDER — POLYETHYLENE GLYCOL 3350 17 G PO PACK: 17.0000 g | PACK | Freq: Every day | ORAL | Status: DC | PRN

## 2020-02-26 MED ORDER — MENTHOL 3 MG MT LOZG: 1.0000 | LOZENGE | OROMUCOSAL | Status: DC | PRN

## 2020-02-26 MED ORDER — SODIUM CHLORIDE (PF) 0.9 % IJ SOLN
INTRAMUSCULAR | Status: AC
  Filled 2020-02-26: qty 50

## 2020-02-26 MED ORDER — PHENYLEPHRINE 40 MCG/ML (10ML) SYRINGE FOR IV PUSH (FOR BLOOD PRESSURE SUPPORT)
PREFILLED_SYRINGE | INTRAVENOUS | Status: DC | PRN
  Administered 2020-02-26 (×3): 80 ug via INTRAVENOUS
  Administered 2020-02-26 (×2): 120 ug via INTRAVENOUS

## 2020-02-26 MED ORDER — ACETAMINOPHEN 500 MG PO TABS
1000.0000 mg | ORAL_TABLET | Freq: Four times a day (QID) | ORAL | Status: AC
  Administered 2020-02-26 – 2020-02-27 (×3): 1000 mg via ORAL
  Filled 2020-02-26 (×3): qty 2

## 2020-02-26 MED ORDER — METHOCARBAMOL 500 MG IVPB - SIMPLE MED
500.0000 mg | Freq: Four times a day (QID) | INTRAVENOUS | Status: DC | PRN
  Administered 2020-02-26: 500 mg via INTRAVENOUS
  Filled 2020-02-26: qty 50

## 2020-02-26 MED ORDER — OXYCODONE HCL 5 MG/5ML PO SOLN: 5.0000 mg | Freq: Once | ORAL | Status: DC | PRN

## 2020-02-26 MED ORDER — SUGAMMADEX SODIUM 200 MG/2ML IV SOLN
INTRAVENOUS | Status: DC | PRN
  Administered 2020-02-26: 200 mg via INTRAVENOUS

## 2020-02-26 SURGICAL SUPPLY — 78 items
ADAPTER BOLT FEMORAL +2/-2 (Knees) ×3 IMPLANT
AUG FEM SZ2.5 4 STRL LF KN RT (Knees) ×1 IMPLANT
AUGMENT DIST PFC 4MM SZ 2.5 RT (Knees) ×1 IMPLANT
BAG DECANTER FOR FLEXI CONT (MISCELLANEOUS) IMPLANT
BAG SPEC THK2 15X12 ZIP CLS (MISCELLANEOUS)
BAG ZIPLOCK 12X15 (MISCELLANEOUS) IMPLANT
BLADE SAG 18X100X1.27 (BLADE) ×3 IMPLANT
BLADE SAW SGTL 11.0X1.19X90.0M (BLADE) ×3 IMPLANT
BLADE SURG SZ10 CARB STEEL (BLADE) ×6 IMPLANT
BNDG ELASTIC 6X5.8 VLCR STR LF (GAUZE/BANDAGES/DRESSINGS) ×3 IMPLANT
BONE CEMENT GENTAMICIN (Cement) ×9 IMPLANT
CEMENT BONE GENTAMICIN 40 (Cement) ×3 IMPLANT
CEMENT RESTRICTOR DEPUY SZ 4 (Cement) ×3 IMPLANT
CLOSURE WOUND 1/2 X4 (GAUZE/BANDAGES/DRESSINGS) ×2
CLOTH BEACON ORANGE TIMEOUT ST (SAFETY) ×3 IMPLANT
COVER SURGICAL LIGHT HANDLE (MISCELLANEOUS) ×3 IMPLANT
COVER WAND RF STERILE (DRAPES) ×3 IMPLANT
CUFF TOURN SGL QUICK 24 (TOURNIQUET CUFF) ×3
CUFF TOURN SGL QUICK 34 (TOURNIQUET CUFF) ×3
CUFF TRNQT CYL 24X4X16.5-23 (TOURNIQUET CUFF) ×1 IMPLANT
CUFF TRNQT CYL 34X4.125X (TOURNIQUET CUFF) ×1 IMPLANT
DECANTER SPIKE VIAL GLASS SM (MISCELLANEOUS) IMPLANT
DISAL AUG PFC 4MM SZ 2.5 RT (Knees) ×3 IMPLANT
DRAPE U-SHAPE 47X51 STRL (DRAPES) ×3 IMPLANT
DRSG ADAPTIC 3X8 NADH LF (GAUZE/BANDAGES/DRESSINGS) ×3 IMPLANT
DRSG AQUACEL AG ADV 3.5X10 (GAUZE/BANDAGES/DRESSINGS) ×3 IMPLANT
DRSG PAD ABDOMINAL 8X10 ST (GAUZE/BANDAGES/DRESSINGS) ×3 IMPLANT
DURAPREP 26ML APPLICATOR (WOUND CARE) ×3 IMPLANT
ELECT REM PT RETURN 15FT ADLT (MISCELLANEOUS) ×3 IMPLANT
EVACUATOR 1/8 PVC DRAIN (DRAIN) ×3 IMPLANT
FEM TC3 RT PFC SIGMA SZ2.5 (Orthopedic Implant) ×3 IMPLANT
FEMORAL ADAPTER (Orthopedic Implant) ×3 IMPLANT
FEMORAL TC3 RT PFC SIGMA SZ2.5 (Orthopedic Implant) ×1 IMPLANT
GAUZE SPONGE 2X2 8PLY STRL LF (GAUZE/BANDAGES/DRESSINGS) ×1 IMPLANT
GAUZE SPONGE 4X4 12PLY STRL (GAUZE/BANDAGES/DRESSINGS) ×3 IMPLANT
GLOVE BIO SURGEON STRL SZ7 (GLOVE) ×3 IMPLANT
GLOVE BIO SURGEON STRL SZ8 (GLOVE) ×3 IMPLANT
GLOVE BIOGEL PI IND STRL 7.0 (GLOVE) ×1 IMPLANT
GLOVE BIOGEL PI IND STRL 8 (GLOVE) ×1 IMPLANT
GLOVE BIOGEL PI INDICATOR 7.0 (GLOVE) ×2
GLOVE BIOGEL PI INDICATOR 8 (GLOVE) ×2
GOWN STRL REUS W/TWL LRG LVL3 (GOWN DISPOSABLE) ×6 IMPLANT
HANDPIECE INTERPULSE COAX TIP (DISPOSABLE) ×3
HOLDER FOLEY CATH W/STRAP (MISCELLANEOUS) IMPLANT
IMMOBILIZER KNEE 20 (SOFTGOODS) ×3
IMMOBILIZER KNEE 20 THIGH 36 (SOFTGOODS) ×1 IMPLANT
INSERT TIBIAL TC3 SZ 2.5 12.5 (Knees) ×3 IMPLANT
KIT TURNOVER KIT A (KITS) IMPLANT
MANIFOLD NEPTUNE II (INSTRUMENTS) ×3 IMPLANT
NS IRRIG 1000ML POUR BTL (IV SOLUTION) ×3 IMPLANT
PACK TOTAL KNEE CUSTOM (KITS) ×3 IMPLANT
PADDING CAST COTTON 6X4 STRL (CAST SUPPLIES) ×3 IMPLANT
PENCIL SMOKE EVACUATOR (MISCELLANEOUS) ×3 IMPLANT
PIN STEINMAN FIXATION KNEE (PIN) ×3 IMPLANT
POST AUG PFC 4MM SZ 2.5 (Knees) ×6 IMPLANT
PROTECTOR NERVE ULNAR (MISCELLANEOUS) ×3 IMPLANT
SET HNDPC FAN SPRY TIP SCT (DISPOSABLE) ×1 IMPLANT
SPONGE GAUZE 2X2 STER 10/PKG (GAUZE/BANDAGES/DRESSINGS) ×2
STEM TIBIA PFC 13X30MM (Stem) ×3 IMPLANT
STEM UNIVERSAL REVISION 75X12 (Stem) ×3 IMPLANT
STRIP CLOSURE SKIN 1/2X4 (GAUZE/BANDAGES/DRESSINGS) ×4 IMPLANT
SUT MNCRL AB 4-0 PS2 18 (SUTURE) ×3 IMPLANT
SUT STRATAFIX 0 PDS 27 VIOLET (SUTURE) ×3
SUT VIC AB 2-0 CT1 27 (SUTURE) ×9
SUT VIC AB 2-0 CT1 TAPERPNT 27 (SUTURE) ×3 IMPLANT
SUTURE STRATFX 0 PDS 27 VIOLET (SUTURE) ×1 IMPLANT
SWAB COLLECTION DEVICE MRSA (MISCELLANEOUS) IMPLANT
SWAB CULTURE ESWAB REG 1ML (MISCELLANEOUS) ×6 IMPLANT
SYR 50ML LL SCALE MARK (SYRINGE) ×6 IMPLANT
TOWER CARTRIDGE SMART MIX (DISPOSABLE) ×3 IMPLANT
TRAY FOLEY MTR SLVR 16FR STAT (SET/KITS/TRAYS/PACK) ×3 IMPLANT
TRAY REVISION SZ 2.5 (Knees) ×3 IMPLANT
TRAY SLEEVE CEM ML (Knees) ×3 IMPLANT
TUBE KAMVAC SUCTION (TUBING) IMPLANT
WATER STERILE IRR 1000ML POUR (IV SOLUTION) ×6 IMPLANT
WEDGE SZ 2.0MM 5MM (Knees) ×6 IMPLANT
WRAP KNEE MAXI GEL POST OP (GAUZE/BANDAGES/DRESSINGS) ×3 IMPLANT
YANKAUER SUCT BULB TIP 10FT TU (MISCELLANEOUS) ×3 IMPLANT

## 2020-02-26 NOTE — Progress Notes (Signed)
PT Cancellation Note  Patient Details Name: Kathleen Velazquez MRN: [id number] DOB: 03/29/1956   Cancelled Treatment:    Reason Eval/Treat Not Completed: Patient's level of consciousness;Fatigue/lethargy limiting ability to participate (Pt resting in bed. Pt able to be alerted to answer simple questions but having difficulty keeping eyes open. Will follow up at later time/date when pt less lethargic and as schedule allows.)   Wynn Maudlin, DPT Acute Rehabilitation Services  Office [phone] Pager [phone]  02/26/2020 5:50 PM

## 2020-02-26 NOTE — Progress Notes (Signed)
Orthopedic Tech Progress Note Patient Details:  Kathleen Velazquez Dec 27, 1955 [id number]  CPM Right Knee CPM Right Knee: On Right Knee Flexion (Degrees): 40 Right Knee Extension (Degrees): 10  Post Interventions Patient Tolerated: Well Instructions Provided: Care of device  [name] 02/26/2020, 3:59 PM

## 2020-02-26 NOTE — Transfer of Care (Signed)
Immediate Anesthesia Transfer of Care Note  Patient: Kathleen Velazquez  Procedure(s) Performed: TOTAL KNEE REVISION (Right Knee)  Patient Location: PACU  Anesthesia Type:GA combined with regional for post-op pain  Level of Consciousness: awake, patient cooperative and responds to stimulation  Airway & Oxygen Therapy: Patient Spontanous Breathing and Patient connected to face mask oxygen  Post-op Assessment: Report given to RN and Post -op Vital signs reviewed and stable  Post vital signs: Reviewed and stable  Last Vitals:  Vitals Value Taken Time  BP 145/101   Temp    Pulse 87   Resp 14   SpO2 99     Last Pain:  Vitals:   02/26/20 1315  TempSrc:   PainSc: 0-No pain         Complications: No complications documented.

## 2020-02-26 NOTE — Anesthesia Postprocedure Evaluation (Signed)
Anesthesia Post Note  Patient: Kathleen Velazquez  Procedure(s) Performed: TOTAL KNEE REVISION (Right Knee)     Patient location during evaluation: PACU Anesthesia Type: General Level of consciousness: awake and alert and oriented Pain management: pain level controlled Vital Signs Assessment: post-procedure vital signs reviewed and stable Respiratory status: spontaneous breathing, nonlabored ventilation, respiratory function stable and patient connected to face mask oxygen Cardiovascular status: stable and blood pressure returned to baseline Postop Assessment: no apparent nausea or vomiting Anesthetic complications: no   No complications documented.  Last Vitals:  Vitals:   02/26/20 1600 02/26/20 1615  BP: 135/88 (!) 138/96  Pulse: 84 81  Resp: 15 15  Temp:    SpO2: 95% 99%    Last Pain:  Vitals:   02/26/20 1615  TempSrc:   PainSc: 9                  Dick Hark A.

## 2020-02-26 NOTE — Interval H&P Note (Signed)
History and Physical Interval Note:  02/26/2020 11:42 AM  Kathleen Velazquez  has presented today for surgery, with the diagnosis of Failed right total knee arthroplasty.  The various methods of treatment have been discussed with the patient and family. After consideration of risks, benefits and other options for treatment, the patient has consented to  Procedure(s) with comments: TOTAL KNEE REVISION (Right) - as a surgical intervention.  The patient's history has been reviewed, patient examined, no change in status, stable for surgery.  I have reviewed the patient's chart and labs.  Questions were answered to the patient's satisfaction.     Homero Fellers Prophet Renwick

## 2020-02-26 NOTE — Discharge Instructions (Addendum)
 Frank Aluisio, MD Total Joint Specialist [facility] [address] [place], [place] [address] [phone]  TOTAL KNEE REVISION POSTOPERATIVE DIRECTIONS  Knee Rehabilitation, Guidelines Following Surgery  Results after knee surgery are often greatly improved when you follow the exercise, range of motion and muscle strengthening exercises prescribed by your doctor. Safety measures are also important to protect the knee from further injury. If any of these exercises cause you to have increased pain or swelling in your knee joint, decrease the amount until you are comfortable again and slowly increase them. If you have problems or questions, call your caregiver or physical therapist for advice.   BLOOD CLOT PREVENTION . Take a 325 mg Aspirin two times a day for three weeks following surgery. Then take an 81 mg Aspirin once a day for three weeks. Then discontinue Aspirin. . You may resume your vitamins/supplements upon discharge from the hospital. . Do not take any NSAIDs (Advil, Aleve, Ibuprofen, Meloxicam, etc.) until you have discontinued the 325 mg Aspirin.  HOME CARE INSTRUCTIONS  . Remove items at home which could result in a fall. This includes throw rugs or furniture in walking pathways.  . ICE to the affected knee as much as tolerated. Icing helps control swelling. If the swelling is well controlled you will be more comfortable and rehab easier. Continue to use ice on the knee for pain and swelling from surgery. You may notice swelling that will progress down to the foot and ankle. This is normal after surgery. Elevate the leg when you are not up walking on it.    . Continue to use the breathing machine which will help keep your temperature down. It is common for your temperature to cycle up and down following surgery, especially at night when you are not up moving around and exerting yourself. The breathing machine keeps your lungs expanded and your  temperature down. . Do not place pillow under the operative knee, focus on keeping the knee straight while resting  DIET You may resume your previous home diet once you are discharged from the hospital.  DRESSING / WOUND CARE / SHOWERING . Keep your bulky bandage on for 2 days. On the third post-operative day you may remove the Ace bandage and gauze. There is a waterproof adhesive bandage on your skin which will stay in place until your first follow-up appointment. Once you remove this you will not need to place another bandage . You may begin showering 3 days following surgery, but do not submerge the incision under water.  ACTIVITY For the first 5 days, the key is rest and control of pain and swelling . Do your home exercises twice a day starting on post-operative day 3. On the days you go to physical therapy, just do the home exercises once that day. . You should rest, ice and elevate the leg for 50 minutes out of every hour. Get up and walk/stretch for 10 minutes per hour. After 5 days you can increase your activity slowly as tolerated. . Walk with your walker as instructed. Use the walker until you are comfortable transitioning to a cane. Walk with the cane in the opposite hand of the operative leg. You may discontinue the cane once you are comfortable and walking steadily. . Avoid periods of inactivity such as sitting longer than an hour when not asleep. This helps prevent blood clots.  . You may discontinue the knee immobilizer once you are able to perform a straight leg raise   while lying down. . You may resume a sexual relationship in one month or when given the OK by your doctor.  . You may return to work once you are cleared by your doctor.  . Do not drive a car for 6 weeks or until released by your surgeon.  . Do not drive while taking narcotics.  TED HOSE STOCKINGS Wear the elastic stockings on both legs for three weeks following surgery during the day. You may remove them at night  for sleeping.  WEIGHT BEARING Weight bearing as tolerated with assist device (walker, cane, etc) as directed, use it as long as suggested by your surgeon or therapist, typically at least 4-6 weeks.  POSTOPERATIVE CONSTIPATION PROTOCOL Constipation - defined medically as fewer than three stools per week and severe constipation as less than one stool per week.  One of the most common issues patients have following surgery is constipation.  Even if you have a regular bowel pattern at home, your normal regimen is likely to be disrupted due to multiple reasons following surgery.  Combination of anesthesia, postoperative narcotics, change in appetite and fluid intake all can affect your bowels.  In order to avoid complications following surgery, here are some recommendations in order to help you during your recovery period.  . Colace (docusate) - Pick up an over-the-counter form of Colace or another stool softener and take twice a day as long as you are requiring postoperative pain medications.  Take with a full glass of water daily.  If you experience loose stools or diarrhea, hold the colace until you stool forms back up. If your symptoms do not get better within 1 week or if they get worse, check with your doctor. . Dulcolax (bisacodyl) - Pick up over-the-counter and take as directed by the product packaging as needed to assist with the movement of your bowels.  Take with a full glass of water.  Use this product as needed if not relieved by Colace only.  . MiraLax (polyethylene glycol) - Pick up over-the-counter to have on hand. MiraLax is a solution that will increase the amount of water in your bowels to assist with bowel movements.  Take as directed and can mix with a glass of water, juice, soda, coffee, or tea. Take if you go more than two days without a movement. Do not use MiraLax more than once per day. Call your doctor if you are still constipated or irregular after using this medication for 7 days  in a row.  If you continue to have problems with postoperative constipation, please contact the office for further assistance and recommendations.  If you experience "the worst abdominal pain ever" or develop nausea or vomiting, please contact the office immediatly for further recommendations for treatment.  ITCHING If you experience itching with your medications, try taking only a single pain pill, or even half a pain pill at a time.  You can also use Benadryl over the counter for itching or also to help with sleep.   MEDICATIONS See your medication summary on the "After Visit Summary" that the nursing staff will review with you prior to discharge.  You may have some home medications which will be placed on hold until you complete the course of blood thinner medication.  It is important for you to complete the blood thinner medication as prescribed by your surgeon.  Continue your approved medications as instructed at time of discharge.  PRECAUTIONS . If you experience chest pain or shortness of breath -   call 911 immediately for transfer to the hospital emergency department.  . If you develop a fever greater that 101 F, purulent drainage from wound, increased redness or drainage from wound, foul odor from the wound/dressing, or calf pain - CONTACT YOUR SURGEON.                                                   FOLLOW-UP APPOINTMENTS Make sure you keep all of your appointments after your operation with your surgeon and caregivers. You should call the office at the above phone number and make an appointment for approximately two weeks after the date of your surgery or on the date instructed by your surgeon outlined in the "After Visit Summary".  RANGE OF MOTION AND STRENGTHENING EXERCISES  Rehabilitation of the knee is important following a knee injury or an operation. After just a few days of immobilization, the muscles of the thigh which control the knee become weakened and shrink (atrophy). Knee  exercises are designed to build up the tone and strength of the thigh muscles and to improve knee motion. Often times heat used for twenty to thirty minutes before working out will loosen up your tissues and help with improving the range of motion but do not use heat for the first two weeks following surgery. These exercises can be done on a training (exercise) mat, on the floor, on a table or on a bed. Use what ever works the best and is most comfortable for you Knee exercises include:  . Leg Lifts - While your knee is still immobilized in a splint or cast, you can do straight leg raises. Lift the leg to 60 degrees, hold for 3 sec, and slowly lower the leg. Repeat 10-20 times 2-3 times daily. Perform this exercise against resistance later as your knee gets better.  . Quad and Hamstring Sets - Tighten up the muscle on the front of the thigh (Quad) and hold for 5-10 sec. Repeat this 10-20 times hourly. Hamstring sets are done by pushing the foot backward against an object and holding for 5-10 sec. Repeat as with quad sets.   Leg Slides: Lying on your back, slowly slide your foot toward your buttocks, bending your knee up off the floor (only go as far as is comfortable). Then slowly slide your foot back down until your leg is flat on the floor again.  Angel Wings: Lying on your back spread your legs to the side as far apart as you can without causing discomfort.  A rehabilitation program following serious knee injuries can speed recovery and prevent re-injury in the future due to weakened muscles. Contact your doctor or a physical therapist for more information on knee rehabilitation.   IF YOU ARE TRANSFERRED TO A SKILLED REHAB FACILITY If the patient is transferred to a skilled rehab facility following release from the hospital, a list of the current medications will be sent to the facility for the patient to continue.  When discharged from the skilled rehab facility, please have the facility set up the  patient's Home Health Physical Therapy prior to being released. Also, the skilled facility will be responsible for providing the patient with their medications at time of release from the facility to include their pain medication, the muscle relaxants, and their blood thinner medication. If the patient is still at the rehab facility   at time of the two week follow up appointment, the skilled rehab facility will also need to assist the patient in arranging follow up appointment in our office and any transportation needs.  MAKE SURE YOU:  . Understand these instructions.  . Get help right away if you are not doing well or get worse.   DENTAL ANTIBIOTICS:  In most cases prophylactic antibiotics for Dental procdeures after total joint surgery are not necessary.  Exceptions are as follows:  1. History of prior total joint infection  2. Severely immunocompromised (Organ Transplant, cancer chemotherapy, Rheumatoid biologic meds such as Humera)  3. Poorly controlled diabetes (A1C &gt; 8.0, blood glucose over 200)  If you have one of these conditions, contact your surgeon for an antibiotic prescription, prior to your dental procedure.    Pick up stool softner and laxative for home use following surgery while on pain medications. Do not submerge incision under water. Please use good hand washing techniques while changing dressing each day. May shower starting three days after surgery. Please use a clean towel to pat the incision dry following showers. Continue to use ice for pain and swelling after surgery. Do not use any lotions or creams on the incision until instructed by your surgeon.  

## 2020-02-26 NOTE — Anesthesia Procedure Notes (Signed)
Procedure Name: Intubation Date/Time: 02/26/2020 1:35 PM Performed by: Silas Sacramento, CRNA Pre-anesthesia Checklist: Patient identified, Emergency Drugs available, Suction available and Patient being monitored Patient Re-evaluated:Patient Re-evaluated prior to induction Oxygen Delivery Method: Circle system utilized Preoxygenation: Pre-oxygenation with 100% oxygen Induction Type: IV induction, Rapid sequence and Cricoid Pressure applied Laryngoscope Size: Mac and 4 Grade View: Grade I Tube type: Oral Tube size: 7.0 mm Number of attempts: 1 Airway Equipment and Method: Stylet Placement Confirmation: ETT inserted through vocal cords under direct vision,  positive ETCO2 and breath sounds checked- equal and bilateral Secured at: 21 cm Tube secured with: Tape Dental Injury: Teeth and Oropharynx as per pre-operative assessment

## 2020-02-26 NOTE — Progress Notes (Signed)
Orthopedic Tech Progress Note Patient Details:  Kathleen Velazquez November 20, 1955 [id number] Took off cpm 7:10 Patient ID: Kathleen Velazquez, female   DOB: March 21, 1956, 64 y.o.   MRN: [id number]   Kathleen Velazquez 02/26/2020, 7:15 PM

## 2020-02-26 NOTE — Brief Op Note (Signed)
02/26/2020  3:07 PM  PATIENT:  Kathleen Velazquez  64 y.o. female  PRE-OPERATIVE DIAGNOSIS:  Failed right total knee arthroplasty  POST-OPERATIVE DIAGNOSIS:  Failed right total knee arthroplasty  PROCEDURE:  Procedure(s) with comments: TOTAL KNEE REVISION (Right) -  SURGEON:  Surgeon(s) and Role:    Ollen Gross, MD - Primary  PHYSICIAN ASSISTANT:   ASSISTANTS: Arther Abbott PA-C  ANESTHESIA:   Adductor canal block and spinal  EBL:  100 mL   BLOOD ADMINISTERED:none  DRAINS: (Medium) Hemovact drain(s) in the right knee with  Suction Open   LOCAL MEDICATIONS USED:  OTHER Expare  COUNTS:  YES                                                                                                                                                                                                                      TOURNIQUET:  * Missing tourniquet times found for documented tourniquets in log: [id number] * Total Tourniquet Time Documented: Thigh (Right) - 31 minutes Total: Thigh (Right) - 31 minutes   DICTATION: .Other Dictation: Dictation Number [id number]  PLAN OF CARE: Admit to inpatient   PATIENT DISPOSITION:  PACU - hemodynamically stable.

## 2020-02-26 NOTE — Progress Notes (Signed)
AssistedDr. Foster with right, ultrasound guided, adductor canal block. Side rails up, monitors on throughout procedure. See vital signs in flow sheet. Tolerated Procedure well.  

## 2020-02-26 NOTE — Anesthesia Procedure Notes (Signed)
Anesthesia Regional Block: Adductor canal block   Pre-Anesthetic Checklist: ,, timeout performed, Correct Patient, Correct Site, Correct Laterality, Correct Procedure, Correct Position, site marked, Risks and benefits discussed,  Surgical consent,  Pre-op evaluation,  At surgeon's request and post-op pain management  Laterality: Right  Prep: chloraprep       Needles:  Injection technique: Single-shot  Needle Type: Echogenic Stimulator Needle     Needle Length: 9cm  Needle Gauge: 21   Needle insertion depth: 7 cm   Additional Needles:   Procedures:,,,, ultrasound used (permanent image in chart),,,,  Narrative:  Start time: 02/26/2020 12:55 PM End time: 02/26/2020 1:00 PM Injection made incrementally with aspirations every 5 mL.  Performed by: Personally  Anesthesiologist: Mal Amabile, MD  Additional Notes: Timeout performed. Patient sedated. Relevant anatomy ID'd using [place]. Incremental 2-97ml injection of LA with frequent aspiration. Patient tolerated procedure well.        Right Adductor Canal Block

## 2020-02-27 LAB — CBC
HCT: 31.4 % — ABNORMAL LOW (ref 36.0–46.0)
Hemoglobin: 10 g/dL — ABNORMAL LOW (ref 12.0–15.0)
MCH: 32.1 pg (ref 26.0–34.0)
MCHC: 31.8 g/dL (ref 30.0–36.0)
MCV: 100.6 fL — ABNORMAL HIGH (ref 80.0–100.0)
Platelets: 174 10*3/uL (ref 150–400)
RBC: 3.12 MIL/uL — ABNORMAL LOW (ref 3.87–5.11)
RDW: 13 % (ref 11.5–15.5)
WBC: 8.3 10*3/uL (ref 4.0–10.5)
nRBC: 0 % (ref 0.0–0.2)

## 2020-02-27 LAB — BASIC METABOLIC PANEL
Anion gap: 10 (ref 5–15)
BUN: 16 mg/dL (ref 8–23)
CO2: 22 mmol/L (ref 22–32)
Calcium: 8.3 mg/dL — ABNORMAL LOW (ref 8.9–10.3)
Chloride: 104 mmol/L (ref 98–111)
Creatinine, Ser: 0.74 mg/dL (ref 0.44–1.00)
GFR calc Af Amer: >60 mL/min (ref >60)
GFR calc non Af Amer: >60 mL/min (ref >60)
Glucose, Bld: 256 mg/dL — ABNORMAL HIGH (ref 70–99)
Potassium: 4 mmol/L (ref 3.5–5.1)
Sodium: 136 mmol/L (ref 135–145)

## 2020-02-27 NOTE — Evaluation (Signed)
Physical Therapy Evaluation Patient Details Name: Kathleen Velazquez MRN: [id number] DOB: Apr 13, 1956 Today's Date: 02/27/2020   History of Present Illness  Pt s/p R TKR revision and with hx of bil TKR  Clinical Impression  Pt s/p R TKR revision and presents with decreased R LE strength/ROM and post op pain limiting functional mobility.  Pt should progress to dc home with family assist.  Pt reports first OP PT set for Monday 03/02/20.    Follow Up Recommendations Follow surgeon's recommendation for DC plan and follow-up therapies    Equipment Recommendations  None recommended by PT    Recommendations for Other Services       Precautions / Restrictions Precautions Precautions: Knee;Fall Required Braces or Orthoses: Knee Immobilizer - Right Knee Immobilizer - Right: Discontinue once straight leg raise with < 10 degree lag Restrictions Weight Bearing Restrictions: No Other Position/Activity Restrictions: WBAT      Mobility  Bed Mobility Overal bed mobility: Needs Assistance Bed Mobility: Supine to Sit     Supine to sit: Min assist     General bed mobility comments: cues for sequence and use of L LE to self assist.  Physical assist to manage R LE  Transfers Overall transfer level: Needs assistance Equipment used: Rolling walker (2 wheeled) Transfers: Sit to/from Stand Sit to Stand: Min assist         General transfer comment: cues for LE management and use of UEs to self assist  Ambulation/Gait Ambulation/Gait assistance: Min assist Gait Distance (Feet): 36 Feet Assistive device: Rolling walker (2 wheeled) Gait Pattern/deviations: Step-to pattern;Decreased step length - right;Decreased step length - left;Shuffle;Trunk flexed Gait velocity: decr   General Gait Details: cues for sequence, posture and position from RW; distance ltd by pain  Stairs            Wheelchair Mobility    Modified Rankin (Stroke Patients Only)       Balance Overall balance  assessment: Needs assistance Sitting-balance support: No upper extremity supported;Feet supported Sitting balance-Leahy Scale: Good     Standing balance support: Bilateral upper extremity supported Standing balance-Leahy Scale: Poor                               Pertinent Vitals/Pain Pain Assessment: 0-10 Pain Score: 8  Pain Location: R knee Pain Descriptors / Indicators: Aching;Burning;Grimacing;Guarding;Sore Pain Intervention(s): Limited activity within patient's tolerance;Monitored during session;Ice applied;Patient requesting pain meds-RN notified    Home Living Family/patient expects to be discharged to:: Private residence Living Arrangements: Alone Available Help at Discharge: Available PRN/intermittently Type of Home: House Home Access: Stairs to enter   [employer] of Steps: 1 Home Layout: Two level Home Equipment: Walker - 2 wheels;Cane - single point      Prior Function Level of Independence: Independent with assistive device(s)         Comments: using RW or cane to assist with ambulation     Hand Dominance        Extremity/Trunk Assessment   Upper Extremity Assessment Upper Extremity Assessment: Overall WFL for tasks assessed    Lower Extremity Assessment Lower Extremity Assessment: RLE deficits/detail    Cervical / Trunk Assessment Cervical / Trunk Assessment: Normal  Communication   Communication: No difficulties  Cognition Arousal/Alertness: Awake/alert Behavior During Therapy: WFL for tasks assessed/performed Overall Cognitive Status: Within Functional Limits for tasks assessed  General Comments      Exercises Total Joint Exercises Ankle Circles/Pumps: AROM;Both;15 reps;Supine   Assessment/Plan    PT Assessment Patient needs continued PT services  PT Problem List Decreased strength;Decreased range of motion;Decreased activity tolerance;Decreased  balance;Decreased mobility;Decreased knowledge of use of DME;Pain       PT Treatment Interventions DME instruction;Gait training;Stair training;Functional mobility training;Therapeutic activities;Therapeutic exercise;Patient/family education    PT Goals (Current goals can be found in the Care Plan section)  Acute Rehab PT Goals Patient Stated Goal: Regain IND PT Goal Formulation: With patient Time For Goal Achievement: 03/05/20 Potential to Achieve Goals: Good    Frequency 7X/week   Barriers to discharge Decreased caregiver support Pt states does not have 24/7 assist but family will be around frequently    Co-evaluation               AM-PAC PT "6 Clicks" Mobility  Outcome Measure Help needed turning from your back to your side while in a flat bed without using bedrails?: A Little Help needed moving from lying on your back to sitting on the side of a flat bed without using bedrails?: A Little Help needed moving to and from a bed to a chair (including a wheelchair)?: A Little Help needed standing up from a chair using your arms (e.g., wheelchair or bedside chair)?: A Little Help needed to walk in hospital room?: A Little Help needed climbing 3-5 steps with a railing? : A Lot 6 Click Score: 17    End of Session Equipment Utilized During Treatment: Gait belt Activity Tolerance: Patient tolerated treatment well;Patient limited by pain Patient left: in chair;with call bell/phone within reach;with chair alarm set Nurse Communication: Mobility status PT Visit Diagnosis: Difficulty in walking, not elsewhere classified (R26.2);Pain Pain - Right/Left: Right Pain - part of body: Knee    Time: 0935-1000 PT Time Calculation (min) (ACUTE ONLY): 25 min   Charges:   PT Evaluation $PT Eval Low Complexity: 1 Low PT Treatments $Gait Training: 8-22 mins        Mauro Kaufmann PT Acute Rehabilitation Services Pager [phone] Office  [phone]   Braian Tijerina 02/27/2020, 12:51 PM

## 2020-02-27 NOTE — Plan of Care (Signed)
  Problem: Education: Goal: Knowledge of the prescribed therapeutic regimen will improve Outcome: Progressing   Problem: Activity: Goal: Ability to avoid complications of mobility impairment will improve Outcome: Progressing Goal: Range of joint motion will improve Outcome: Progressing   Problem: Pain Management: Goal: Pain level will decrease with appropriate interventions Outcome: Progressing   

## 2020-02-27 NOTE — Progress Notes (Signed)
Orthopedic Tech Progress Note Patient Details:  Kathleen Velazquez 05/25/1956 [id number] Patient off cpm 4:45 Patient ID: Kathleen Velazquez, female   DOB: 1956-03-19, 64 y.o.   MRN: [id number]   [name] 02/27/2020, 4:47 PM

## 2020-02-27 NOTE — Progress Notes (Signed)
   Subjective: 1 Day Post-Op Procedure(s) (LRB): TOTAL KNEE REVISION (Right) Patient reports pain as moderate.   Patient seen in rounds with Dr. Lequita Halt. Patient is well, and has had no acute complaints or problems other than soreness in the right knee. Denies chest pain, SOB, or calf pain. No issues overnight.  We will continue therapy today.   Objective: Vital signs in last 24 hours: Temp:  [97.5 F (36.4 C)-98.8 F (37.1 C)] 97.8 F (36.6 C) (07/22 0627) Pulse Rate:  [60-99] 71 (07/22 0627) Resp:  [9-24] 16 (07/22 0627) BP: (107-149)/(70-101) 111/70 (07/22 0627) SpO2:  [91 %-100 %] 95 % (07/22 0627) Weight:  [63.5 kg] 63.5 kg (07/21 1152)  Intake/Output from previous day:  Intake/Output Summary (Last 24 hours) at 02/27/2020 0750 Last data filed at 02/27/2020 6060 Gross per 24 hour  Intake 2671.12 ml  Output 1080 ml  Net 1591.12 ml     Intake/Output this shift: No intake/output data recorded.  Labs: Recent Labs    02/27/20 0321  HGB 10.0*   Recent Labs    02/27/20 0321  WBC 8.3  RBC 3.12*  HCT 31.4*  PLT 174   Recent Labs    02/27/20 0321  NA 136  K 4.0  CL 104  CO2 22  BUN 16  CREATININE 0.74  GLUCOSE 256*  CALCIUM 8.3*   No results for input(s): LABPT, INR in the last 72 hours.  Exam: General - Patient is Alert and Oriented Extremity - Neurologically intact Neurovascular intact Sensation intact distally Dorsiflexion/Plantar flexion intact Dressing - dressing C/D/I Motor Function - intact, moving foot and toes well on exam.   Past Medical History:  Diagnosis Date  . Anxiety   . Arthritis    knees,   . Ejection fraction    Normal, echo, June, 2013  . GERD (gastroesophageal reflux disease)   . Headache(784.0)    hx of migraines   . History of hiatal hernia   . Hypertension   . Irritable bowel syndrome (IBS)   . PONV (postoperative nausea and vomiting)   . Sinus tachycardia    Persistent sinus tachycardia, June, 2013 ( prior monitor  in 2007 had shown no significant abnormalities.)    Assessment/Plan: 1 Day Post-Op Procedure(s) (LRB): TOTAL KNEE REVISION (Right) Principal Problem:   Failed total knee arthroplasty (HCC) Active Problems:   Failed total right knee replacement (HCC)  Estimated body mass index is 25.6 kg/m as calculated from the following:   Height as of this encounter: 5\' 2"  (1.575 m).   Weight as of this encounter: 63.5 kg. Advance diet Up with therapy  DVT Prophylaxis - Aspirin Weight bearing as tolerated. Continue therapy.  Plan is to go Home after hospital stay. Plan for discharge tomorrow pending progress with therapy.  , PA-C Orthopedic Surgery [phone] 02/27/2020, 7:50 AM

## 2020-02-27 NOTE — Progress Notes (Signed)
Physical Therapy Treatment Patient Details Name: Kathleen Velazquez MRN: [id number] DOB: Apr 16, 1956 Today's Date: 02/27/2020    History of Present Illness Pt s/p R TKR revision and with hx of bil TKR    PT Comments    Pt with improved pain control and noted improved activity tolerance.  Pt ambulated increased distance in hall and performed therex program with assist.   Follow Up Recommendations  Follow surgeon's recommendation for DC plan and follow-up therapies     Equipment Recommendations  None recommended by PT    Recommendations for Other Services       Precautions / Restrictions Precautions Precautions: Knee;Fall Required Braces or Orthoses: Knee Immobilizer - Right Knee Immobilizer - Right: Discontinue once straight leg raise with < 10 degree lag Restrictions Weight Bearing Restrictions: No Other Position/Activity Restrictions: WBAT    Mobility  Bed Mobility Overal bed mobility: Needs Assistance Bed Mobility: Supine to Sit     Supine to sit: Min assist     General bed mobility comments: Pt up in chair and requests back to same  Transfers Overall transfer level: Needs assistance Equipment used: Rolling walker (2 wheeled) Transfers: Sit to/from Stand Sit to Stand: Min assist         General transfer comment: cues for LE management and use of UEs to self assist  Ambulation/Gait Ambulation/Gait assistance: Min assist Gait Distance (Feet): 78 Feet Assistive device: Rolling walker (2 wheeled) Gait Pattern/deviations: Step-to pattern;Decreased step length - right;Decreased step length - left;Shuffle;Trunk flexed Gait velocity: decr   General Gait Details: cues for sequence, posture and position from RW; distance ltd by pain   Stairs             Wheelchair Mobility    Modified Rankin (Stroke Patients Only)       Balance Overall balance assessment: Needs assistance Sitting-balance support: No upper extremity supported;Feet supported Sitting  balance-Leahy Scale: Good     Standing balance support: Bilateral upper extremity supported Standing balance-Leahy Scale: Fair                              Cognition Arousal/Alertness: Awake/alert Behavior During Therapy: WFL for tasks assessed/performed Overall Cognitive Status: Within Functional Limits for tasks assessed                                        Exercises Total Joint Exercises Ankle Circles/Pumps: AROM;Both;15 reps;Supine Quad Sets: AROM;Both;10 reps;Supine Heel Slides: AAROM;Right;15 reps;Supine Straight Leg Raises: AAROM;AROM;Right;15 reps;Supine Goniometric ROM: AAROM R knee -5 - 40 - pain limited    General Comments        Pertinent Vitals/Pain Pain Assessment: 0-10 Pain Score: 6  Pain Location: R knee Pain Descriptors / Indicators: Aching;Burning;Grimacing;Guarding;Sore Pain Intervention(s): Limited activity within patient's tolerance;Monitored during session;Premedicated before session;Ice applied    Home Living Family/patient expects to be discharged to:: Private residence Living Arrangements: Alone Available Help at Discharge: Available PRN/intermittently Type of Home: House Home Access: Stairs to enter   Home Layout: Two level Home Equipment: Environmental consultant - 2 wheels;Cane - single point      Prior Function Level of Independence: Independent with assistive device(s)      Comments: using RW or cane to assist with ambulation   PT Goals (current goals can now be found in the care plan section) Acute Rehab PT Goals Patient Stated Goal: Regain  IND PT Goal Formulation: With patient Time For Goal Achievement: 03/05/20 Potential to Achieve Goals: Good Progress towards PT goals: Progressing toward goals    Frequency    7X/week      PT Plan Current plan remains appropriate    Co-evaluation              AM-PAC PT "6 Clicks" Mobility   Outcome Measure  Help needed turning from your back to your side while in  a flat bed without using bedrails?: A Little Help needed moving from lying on your back to sitting on the side of a flat bed without using bedrails?: A Little Help needed moving to and from a bed to a chair (including a wheelchair)?: A Little Help needed standing up from a chair using your arms (e.g., wheelchair or bedside chair)?: A Little Help needed to walk in hospital room?: A Little Help needed climbing 3-5 steps with a railing? : A Lot 6 Click Score: 17    End of Session Equipment Utilized During Treatment: Gait belt Activity Tolerance: Patient tolerated treatment well Patient left: in chair;with call bell/phone within reach;with chair alarm set Nurse Communication: Mobility status PT Visit Diagnosis: Difficulty in walking, not elsewhere classified (R26.2);Pain Pain - Right/Left: Right Pain - part of body: Knee     Time: 1200-1230 PT Time Calculation (min) (ACUTE ONLY): 30 min  Charges:  $Gait Training: 8-22 mins $Therapeutic Exercise: 8-22 mins                     Mauro Kaufmann PT Acute Rehabilitation Services Pager [phone] Office [phone]    Iviana Blasingame 02/27/2020, 12:57 PM

## 2020-02-27 NOTE — Op Note (Signed)
NAME: Kathleen Velazquez, Kathleen Velazquez MEDICAL RECORD ON:[id number] ACCOUNT [id number] DATE OF BIRTH:07/22/1956 FACILITY: [facility] LOCATION: [facility] PHYSICIAN:Sanjana Folz Dulcy Fanny, MD  OPERATIVE REPORT  DATE OF PROCEDURE:  02/26/2020  PREOPERATIVE DIAGNOSIS:  Failed right total knee arthroplasty.  POSTOPERATIVE DIAGNOSIS:  Failed right total knee arthroplasty.  PROCEDURE:  Right total knee arthroplasty revision.  SURGEON:  Ollen Gross, MD  ASSISTANT:  Arther Abbott, PA-C  ANESTHESIA:  Adductor canal block and spinal.  ESTIMATED BLOOD LOSS:  100 mL.  DRAIN:  Hemovac x1.  TOURNIQUET TIME:  Up 31 minutes at 300 mmHg, down 8 minutes, up additional 19 minutes at 300 mmHg.  COMPLICATIONS:  None.  CONDITION:  Stable to recovery.  BRIEF CLINICAL NOTE:  The patient is a 64 year old female with a severely painful right total knee arthroplasty.  She had an episode a few months ago with a marked increase in pain.  She also has significant instability in the knee.  She had a negative  infection workup.  Given her significant pain and instability, she presents now for polyethylene versus total knee revision.  PROCEDURE IN DETAIL:  After successful administration of adductor canal block and spinal anesthetic, a tourniquet was placed high on the right thigh.  Right lower extremity prepped and draped in the usual sterile fashion.  Extremity was wrapped in  Esmarch, knee flexed, and tourniquet inflated to 300 mmHg.  Midline incision made with a 10 blade through subcutaneous tissue to the level of the extensor mechanism.  A fresh blade used to make a medial parapatellar arthrotomy.  A small amount of fluid  in joint sent for stat Gram stain, C and S, which showed no organisms, no white cells.  So we then elevated the soft tissue over the proximal medial tibia into the semimembranosus bursa.  Soft tissue laterally was elevated with attention being paid to  avoid the patellar tendon on tibial tubercle.  Patella was  everted, knee flexed 90 degrees.  Polyethylene did not appear damaged.  Removed the polyethylene from the tibial tray.  The tibia was then subluxed forward and circumferential retraction  obtained.  I used an oscillating saw to disrupt the interface between the tibial component and bone and it was easily disrupted and tibial component easily removed consistent with it being loose.  There was no bone loss present.  We then placed the  extramedullary tibial alignment guide, referencing proximally to medial aspect of tibial tubercle and distally along the 2nd metatarsal axis and tibial crest.  Block was pinned to remove 2 mm off the previously cut bone surface.  Resection was made with  oscillating saw.  Size 2.5 was the most appropriate tibial component.  The proximal tibia was prepared to modular drill then modular drill +13 x 30 stem extension.  We then broached for a 29 mm sleeve.  We then addressed the femoral side.  Minimal disruption was necessary on the interface between the implant and bone in order to remove this implant consistent again with either fibrous ingrowth or no ingrowth.  The component was easily removed with no  bone loss.  There was no cement visible.  A drill was used to create a starting hole in the distal femur and then the canal was thoroughly irrigated.  We reamed up to 14 mm.  The 14 mm reamer was left in place to serve as our intramedullary cutting  guide.  Distal femoral cutting block is placed and need to go in the +4 position laterally to get any bone and  neutral to get any bone medially.  We thus needed a 4 mm distal lateral augment.  Size 2.5 AP cutting block was then placed and rotation was  marked off the epicondylar axis and confirmed by creating a rectangular flexion gap in 90 degrees of flexion with the spacer in place.  Block is pinned in this position.  We had to go in the +4 position posteriorly, both medial and lateral to get any  bone.  We thus needed 4 mm augments  posteriorly, both medial and lateral.  Intercondylar block was placed and the TC3 cut was made.  The trial femur was then placed, which is a 2.5 TC3 femur with augments distally 4 mm on the lateral side and posteriorly 4 mm medial and lateral.  The stem is a 14 x 75 in the +2 position.  When we tried to reduce it the 14 would not go as the canal was  too tight, so I had to go with 12, which was easily reduced.  On the tibial side, we placed a trial, which was a size 2.5 MBT tibial tray with 5 mm augments medial and lateral limbs were size 2 so as to taper down the size of the implant, so it would  cover the bone and not overhang.  The 2 size 5 mm augments were placed medial and lateral 29 mm sleeve was placed and a 13 x 30 stem extension placed.  We went up to a 15 mm as our insert, which allowed for full extension with excellent varus, valgus and  anterior, posterior balance throughout full range of motion.  Patella was everted and soft tissue removed from the patellar component.  There was a well-fixed patellar component and it tracked normally in the trochlea.  Thus, it was left intact.  The tourniquet was let down after a total tourniquet time of 31 minutes and was kept down for 8 minutes.  Minor bleeding points were identified and cauterized.  While the tourniquet was down these implants were assembled on the back table.  After 8  minutes the leg was rewrapped in Esmarch and tourniquet reinflated to 300 mmHg.  The cement was mixed, which is 3 batches of gentamicin impregnated cement.  We did the tibial side first, which is a size 2.5 MBT revision tray with 5 mm medial and lateral  augments, size 2, as well as a 29 sleeve and 13 x 30 stem extension.  We had placed a size 4 cement restrictor prior to doing all this.  Cement was injected into the canal and also placed on the cut bone surface.  The implant was impacted and all  extruded cement removed.  On the femoral side, we cemented distally and the  femoral component was a size 2.5 TC3 femur with the 4 mm distal lateral augment, 4 mm medial and lateral posterior augments.  A 12 x 75 stem extension in the +2 position in 5  degrees of valgus.  This was cemented in place and all extruded cement removed.  Trial 15 mm insert was placed.  Knee held in full extension and again more extruded cement was removed.  The knee was placed through range of motion, which has excellent  stability and the tourniquet was then released after a 2nd tourniquet time of 19 minutes.  Wound was copiously irrigated with saline solution and the permanent 15 mm insert was placed.  The extensor mechanism was closed over the Hemovac drain with  running 0 Stratafix suture.  Flexion against gravity was about 130 degrees.  Patella tracks normally.  Subcutaneous was then closed with interrupted 2-0 Vicryl and subcuticular running 4-0 Monocryl.  Note that 20 mL of Exparel with 60 mL of saline was  injected into the extensor mechanism periosteum of the femur and posterior capsule and this was all done prior to the implant placement.  Once completed, the incision was cleaned and dried and a bulky sterile dressing applied.  She was placed into a knee  immobilizer, awakened and transported to recovery in stable condition.    Please note that a surgical assistant was a medical necessity for this procedure.  Assistance was necessary for retraction of vital ligaments and neurovascular structures, and for proper positioning of the limb, for safe removal of the old implant, and  safe and accurate placement of the new implant.  CN/NUANCE  D:02/26/2020 T:02/27/2020 JOB:[id number]

## 2020-02-27 NOTE — Progress Notes (Signed)
Orthopedic Tech Progress Note Patient Details:  Kathleen Velazquez July 01, 1956 [id number]  Patient ID: [name], female   DOB: 03/05/1956, 64 y.o.   MRN: [id number]   Kizzie Fantasia 02/27/2020, 2:47 PM cpm applied @1445 

## 2020-02-27 NOTE — Progress Notes (Signed)
Physical Therapy Treatment Patient Details Name: Kathleen Velazquez MRN: [id number] DOB: November 07, 1955 Today's Date: 02/27/2020    History of Present Illness Pt s/p R TKR revision and with hx of bil TKR    PT Comments    Pt very cooperative, progressing steadily with mobility and hopeful for dc home tomorrow.   Follow Up Recommendations  Follow surgeon's recommendation for DC plan and follow-up therapies     Equipment Recommendations  None recommended by PT    Recommendations for Other Services       Precautions / Restrictions Precautions Precautions: Knee;Fall Required Braces or Orthoses: Knee Immobilizer - Right Knee Immobilizer - Right: Discontinue once straight leg raise with < 10 degree lag Restrictions Weight Bearing Restrictions: No Other Position/Activity Restrictions: WBAT    Mobility  Bed Mobility Overal bed mobility: Needs Assistance Bed Mobility: Sit to Supine       Sit to supine: Min guard   General bed mobility comments: Pt self assisting R LE with UEs  Transfers Overall transfer level: Needs assistance Equipment used: Rolling walker (2 wheeled) Transfers: Sit to/from Stand Sit to Stand: Min guard         General transfer comment: cues for LE management and use of UEs to self assist  Ambulation/Gait Ambulation/Gait assistance: Min guard Gait Distance (Feet): 118 Feet Assistive device: Rolling walker (2 wheeled) Gait Pattern/deviations: Step-to pattern;Decreased step length - right;Decreased step length - left;Shuffle;Trunk flexed Gait velocity: decr   General Gait Details: cues for sequence, posture and position from RW;    Optometrist    Modified Rankin (Stroke Patients Only)       Balance Overall balance assessment: Needs assistance Sitting-balance support: No upper extremity supported;Feet supported Sitting balance-Leahy Scale: Good     Standing balance support: Bilateral upper extremity  supported Standing balance-Leahy Scale: Fair                              Cognition Arousal/Alertness: Awake/alert Behavior During Therapy: WFL for tasks assessed/performed Overall Cognitive Status: Within Functional Limits for tasks assessed                                        Exercises      General Comments        Pertinent Vitals/Pain Pain Assessment: 0-10 Pain Score: 7  Pain Location: R knee Pain Descriptors / Indicators: Aching;Burning;Grimacing;Guarding;Sore Pain Intervention(s): Limited activity within patient's tolerance;Monitored during session;Patient requesting pain meds-RN notified;Ice applied (Pt requested to take pain meds after PT)    Home Living                      Prior Function            PT Goals (current goals can now be found in the care plan section) Acute Rehab PT Goals Patient Stated Goal: Regain IND PT Goal Formulation: With patient Time For Goal Achievement: 03/05/20 Potential to Achieve Goals: Good Progress towards PT goals: Progressing toward goals    Frequency    7X/week      PT Plan Current plan remains appropriate    Co-evaluation              AM-PAC PT "6 Clicks" Mobility   Outcome Measure  Help needed  turning from your back to your side while in a flat bed without using bedrails?: A Little Help needed moving from lying on your back to sitting on the side of a flat bed without using bedrails?: A Little Help needed moving to and from a bed to a chair (including a wheelchair)?: A Little Help needed standing up from a chair using your arms (e.g., wheelchair or bedside chair)?: A Little Help needed to walk in hospital room?: A Little Help needed climbing 3-5 steps with a railing? : A Lot 6 Click Score: 17    End of Session Equipment Utilized During Treatment: Gait belt Activity Tolerance: Patient tolerated treatment well Patient left: in chair;with call bell/phone within  reach;with chair alarm set Nurse Communication: Mobility status PT Visit Diagnosis: Difficulty in walking, not elsewhere classified (R26.2);Pain Pain - Right/Left: Right Pain - part of body: Knee     Time: 1421-1443 PT Time Calculation (min) (ACUTE ONLY): 22 min  Charges:  $Gait Training: 8-22 mins                     Mauro Kaufmann PT Acute Rehabilitation Services Pager [phone] Office [phone]    Yola Paradiso 02/27/2020, 4:03 PM

## 2020-02-28 ENCOUNTER — Encounter (HOSPITAL_COMMUNITY): Payer: Self-pay | Admitting: Orthopedic Surgery

## 2020-02-28 LAB — BASIC METABOLIC PANEL
Anion gap: 9 (ref 5–15)
BUN: 14 mg/dL (ref 8–23)
CO2: 24 mmol/L (ref 22–32)
Calcium: 8.6 mg/dL — ABNORMAL LOW (ref 8.9–10.3)
Chloride: 109 mmol/L (ref 98–111)
Creatinine, Ser: 0.56 mg/dL (ref 0.44–1.00)
GFR calc Af Amer: >60 mL/min (ref >60)
GFR calc non Af Amer: >60 mL/min (ref >60)
Glucose, Bld: 121 mg/dL — ABNORMAL HIGH (ref 70–99)
Potassium: 4.2 mmol/L (ref 3.5–5.1)
Sodium: 142 mmol/L (ref 135–145)

## 2020-02-28 LAB — CBC
HCT: 29.8 % — ABNORMAL LOW (ref 36.0–46.0)
Hemoglobin: 9.2 g/dL — ABNORMAL LOW (ref 12.0–15.0)
MCH: 31.8 pg (ref 26.0–34.0)
MCHC: 30.9 g/dL (ref 30.0–36.0)
MCV: 103.1 fL — ABNORMAL HIGH (ref 80.0–100.0)
Platelets: 161 10*3/uL (ref 150–400)
RBC: 2.89 MIL/uL — ABNORMAL LOW (ref 3.87–5.11)
RDW: 13.2 % (ref 11.5–15.5)
WBC: 10.3 10*3/uL (ref 4.0–10.5)
nRBC: 0 % (ref 0.0–0.2)

## 2020-02-28 MED ORDER — HYDROMORPHONE HCL 2 MG PO TABS: 2.0000 mg | ORAL_TABLET | Freq: Four times a day (QID) | ORAL | 0 refills | Status: DC | PRN

## 2020-02-28 MED ORDER — ASPIRIN 325 MG PO TBEC: 325.0000 mg | DELAYED_RELEASE_TABLET | Freq: Two times a day (BID) | ORAL | 0 refills | Status: AC

## 2020-02-28 MED ORDER — GABAPENTIN 300 MG PO CAPS: ORAL_CAPSULE | ORAL | 0 refills | Status: DC

## 2020-02-28 MED ORDER — TRAMADOL HCL 50 MG PO TABS: 50.0000 mg | ORAL_TABLET | Freq: Four times a day (QID) | ORAL | 0 refills | Status: DC | PRN

## 2020-02-28 MED ORDER — METHOCARBAMOL 500 MG PO TABS: 500.0000 mg | ORAL_TABLET | Freq: Four times a day (QID) | ORAL | 0 refills | Status: DC | PRN

## 2020-02-28 NOTE — Progress Notes (Signed)
   Subjective: 2 Days Post-Op Procedure(s) (LRB): TOTAL KNEE REVISION (Right) Patient reports pain as mild.   Patient seen in rounds by Dr. Lequita Halt. Patient is well, and has had no acute complaints or problems. States she is ready to go home. Denies chest pain, SOB, or calf pain. Voiding without difficulty. No issues overnight.  Plan is to go Home after hospital stay.  Objective: Vital signs in last 24 hours: Temp:  [97.4 F (36.3 C)-97.6 F (36.4 C)] 97.6 F (36.4 C) (07/23 0519) Pulse Rate:  [56-80] 79 (07/23 0519) Resp:  [13-18] 16 (07/23 0519) BP: (102-175)/(66-84) 116/69 (07/23 0519) SpO2:  [93 %-98 %] 96 % (07/23 0519)  Intake/Output from previous day:  Intake/Output Summary (Last 24 hours) at 02/28/2020 0750 Last data filed at 02/28/2020 0519 Gross per 24 hour  Intake 1380 ml  Output --  Net 1380 ml    Intake/Output this shift: No intake/output data recorded.  Labs: Recent Labs    02/27/20 0321 02/28/20 0311  HGB 10.0* 9.2*   Recent Labs    02/27/20 0321 02/28/20 0311  WBC 8.3 10.3  RBC 3.12* 2.89*  HCT 31.4* 29.8*  PLT 174 161   Recent Labs    02/27/20 0321 02/28/20 0311  NA 136 142  K 4.0 4.2  CL 104 109  CO2 22 24  BUN 16 14  CREATININE 0.74 0.56  GLUCOSE 256* 121*  CALCIUM 8.3* 8.6*   No results for input(s): LABPT, INR in the last 72 hours.  Exam: General - Patient is Alert and Oriented Extremity - Neurologically intact Neurovascular intact Sensation intact distally Dorsiflexion/Plantar flexion intact Dressing/Incision - clean, dry, no drainage Motor Function - intact, moving foot and toes well on exam.   Past Medical History:  Diagnosis Date  . Anxiety   . Arthritis    knees,   . Ejection fraction    Normal, echo, June, 2013  . GERD (gastroesophageal reflux disease)   . Headache(784.0)    hx of migraines   . History of hiatal hernia   . Hypertension   . Irritable bowel syndrome (IBS)   . PONV (postoperative nausea and  vomiting)   . Sinus tachycardia    Persistent sinus tachycardia, June, 2013 ( prior monitor in 2007 had shown no significant abnormalities.)    Assessment/Plan: 2 Days Post-Op Procedure(s) (LRB): TOTAL KNEE REVISION (Right) Principal Problem:   Failed total knee arthroplasty (HCC) Active Problems:   Failed total right knee replacement (HCC)  Estimated body mass index is 25.6 kg/m as calculated from the following:   Height as of this encounter: 5\' 2"  (1.575 m).   Weight as of this encounter: 63.5 kg. Up with therapy D/C IV fluids  DVT Prophylaxis - Aspirin Weight-bearing as tolerated  Plan for discharge after one session of PT. Scheduled for outpatient therapy at [facility] in [place] Follow-up in the office on August 3rd.  The PDMP database was reviewed today (02/28/2020) prior to any opioid medications being prescribed to this patient.    03/01/2020, PA-C Orthopedic Surgery [phone] 02/28/2020, 7:50 AM

## 2020-02-28 NOTE — Progress Notes (Signed)
Physical Therapy Treatment Patient Details Name: Kathleen Velazquez MRN: [id number] DOB: 11-21-1955 Today's Date: 02/28/2020    History of Present Illness Pt s/p R TKR revision and with hx of bil TKR    PT Comments    Pt continues to progress with mobility but moving slower this am - pt states more stiff and sore.  Pt negotiated stairs with assist and written instruction provided.   Follow Up Recommendations  Follow surgeon's recommendation for DC plan and follow-up therapies     Equipment Recommendations  None recommended by PT    Recommendations for Other Services       Precautions / Restrictions Precautions Precautions: Knee;Fall Required Braces or Orthoses: Knee Immobilizer - Right Knee Immobilizer - Right: Discontinue once straight leg raise with < 10 degree lag Restrictions Weight Bearing Restrictions: No Other Position/Activity Restrictions: WBAT    Mobility  Bed Mobility Overal bed mobility: Needs Assistance Bed Mobility: Supine to Sit     Supine to sit: Supervision     General bed mobility comments: Pt self assisting R LE with UEs  Transfers Overall transfer level: Needs assistance Equipment used: Rolling walker (2 wheeled) Transfers: Sit to/from Stand Sit to Stand: Min guard;Supervision         General transfer comment: cues for LE management and use of UEs to self assist  Ambulation/Gait Ambulation/Gait assistance: Min guard;Supervision Gait Distance (Feet): 100 Feet Assistive device: Rolling walker (2 wheeled) Gait Pattern/deviations: Step-to pattern;Decreased step length - right;Decreased step length - left;Shuffle;Trunk flexed Gait velocity: decr   General Gait Details: Increased time with min cues for sequence, posture and position from RW;    Stairs Stairs: Yes Stairs assistance: Min assist Stair Management: No rails;One rail Left;Forwards;Backwards;With walker;With cane;Step to pattern Number of Stairs: 7 General stair comments: single  step twice bkwd with RW; 5 steps with rail and cane; cues for sequence and foot/cane/RW placement   Wheelchair Mobility    Modified Rankin (Stroke Patients Only)       Balance Overall balance assessment: Needs assistance Sitting-balance support: No upper extremity supported;Feet supported Sitting balance-Leahy Scale: Good     Standing balance support: Bilateral upper extremity supported Standing balance-Leahy Scale: Fair                              Cognition Arousal/Alertness: Awake/alert Behavior During Therapy: WFL for tasks assessed/performed Overall Cognitive Status: Within Functional Limits for tasks assessed                                        Exercises      General Comments        Pertinent Vitals/Pain Pain Assessment: 0-10 Pain Score: 7  Pain Location: R knee Pain Descriptors / Indicators: Aching;Burning;Grimacing;Guarding;Sore Pain Intervention(s): Limited activity within patient's tolerance;Monitored during session;Premedicated before session;Ice applied    Home Living                      Prior Function            PT Goals (current goals can now be found in the care plan section) Acute Rehab PT Goals Patient Stated Goal: Regain IND PT Goal Formulation: With patient Time For Goal Achievement: 03/05/20 Potential to Achieve Goals: Good Progress towards PT goals: Progressing toward goals    Frequency    7X/week  PT Plan Current plan remains appropriate    Co-evaluation              AM-PAC PT "6 Clicks" Mobility   Outcome Measure  Help needed turning from your back to your side while in a flat bed without using bedrails?: A Little Help needed moving from lying on your back to sitting on the side of a flat bed without using bedrails?: A Little Help needed moving to and from a bed to a chair (including a wheelchair)?: A Little Help needed standing up from a chair using your arms (e.g.,  wheelchair or bedside chair)?: A Little Help needed to walk in hospital room?: A Little Help needed climbing 3-5 steps with a railing? : A Little 6 Click Score: 18    End of Session Equipment Utilized During Treatment: Gait belt Activity Tolerance: Patient tolerated treatment well Patient left: in chair;with call bell/phone within reach;with chair alarm set Nurse Communication: Mobility status PT Visit Diagnosis: Difficulty in walking, not elsewhere classified (R26.2);Pain Pain - Right/Left: Right Pain - part of body: Knee     Time: 3838-1840 PT Time Calculation (min) (ACUTE ONLY): 32 min  Charges:  $Gait Training: 23-37 mins                     Mauro Kaufmann PT Acute Rehabilitation Services Pager [phone] Office [phone]    Samvel Zinn 02/28/2020, 12:17 PM

## 2020-02-28 NOTE — Progress Notes (Signed)
Physical Therapy Treatment Patient Details Name: Kathleen Velazquez MRN: [id number] DOB: 1955-12-21 Today's Date: 02/28/2020    History of Present Illness Pt s/p R TKR revision and with hx of bil TKR    PT Comments    Pt performed HEP with assist and increased time/multiple rests to complete tasks.  Written instruction provided and reviewed.   Follow Up Recommendations  Follow surgeon's recommendation for DC plan and follow-up therapies     Equipment Recommendations  None recommended by PT    Recommendations for Other Services       Precautions / Restrictions Precautions Precautions: Knee;Fall Required Braces or Orthoses: Knee Immobilizer - Right Knee Immobilizer - Right: Discontinue once straight leg raise with < 10 degree lag Restrictions Weight Bearing Restrictions: No Other Position/Activity Restrictions: WBAT    Mobility  Bed Mobility Overal bed mobility: Needs Assistance Bed Mobility: Supine to Sit     Supine to sit: Supervision     General bed mobility comments: Pt self assisting R LE with UEs  Transfers Overall transfer level: Needs assistance Equipment used: Rolling walker (2 wheeled) Transfers: Sit to/from Stand Sit to Stand: Min guard;Supervision         General transfer comment: cues for LE management and use of UEs to self assist  Ambulation/Gait Ambulation/Gait assistance: Min guard;Supervision Gait Distance (Feet): 100 Feet Assistive device: Rolling walker (2 wheeled) Gait Pattern/deviations: Step-to pattern;Decreased step length - right;Decreased step length - left;Shuffle;Trunk flexed Gait velocity: decr   General Gait Details: Increased time with min cues for sequence, posture and position from RW;    Stairs Stairs: Yes Stairs assistance: Min assist Stair Management: No rails;One rail Left;Forwards;Backwards;With walker;With cane;Step to pattern Number of Stairs: 7 General stair comments: single step twice bkwd with RW; 5 steps with  rail and cane; cues for sequence and foot/cane/RW placement   Wheelchair Mobility    Modified Rankin (Stroke Patients Only)       Balance Overall balance assessment: Needs assistance Sitting-balance support: No upper extremity supported;Feet supported Sitting balance-Leahy Scale: Good     Standing balance support: Bilateral upper extremity supported Standing balance-Leahy Scale: Fair                              Cognition Arousal/Alertness: Awake/alert Behavior During Therapy: WFL for tasks assessed/performed Overall Cognitive Status: Within Functional Limits for tasks assessed                                        Exercises Total Joint Exercises Ankle Circles/Pumps: AROM;Both;15 reps;Supine Quad Sets: AROM;Both;10 reps;Supine Heel Slides: AAROM;Right;15 reps;Supine Straight Leg Raises: AAROM;AROM;Right;15 reps;Supine Goniometric ROM: AAROM R knee -5 - 45    General Comments        Pertinent Vitals/Pain Pain Assessment: 0-10 Pain Score: 7  Pain Location: R knee Pain Descriptors / Indicators: Aching;Burning;Grimacing;Guarding;Sore Pain Intervention(s): Limited activity within patient's tolerance;Monitored during session;Premedicated before session;Ice applied    Home Living                      Prior Function            PT Goals (current goals can now be found in the care plan section) Acute Rehab PT Goals Patient Stated Goal: Regain IND PT Goal Formulation: With patient Time For Goal Achievement: 03/05/20 Potential to Achieve Goals: Good  Progress towards PT goals: Progressing toward goals    Frequency    7X/week      PT Plan Current plan remains appropriate    Co-evaluation              AM-PAC PT "6 Clicks" Mobility   Outcome Measure  Help needed turning from your back to your side while in a flat bed without using bedrails?: A Little Help needed moving from lying on your back to sitting on the  side of a flat bed without using bedrails?: A Little Help needed moving to and from a bed to a chair (including a wheelchair)?: A Little Help needed standing up from a chair using your arms (e.g., wheelchair or bedside chair)?: A Little Help needed to walk in hospital room?: A Little Help needed climbing 3-5 steps with a railing? : A Little 6 Click Score: 18    End of Session Equipment Utilized During Treatment: Gait belt Activity Tolerance: Patient tolerated treatment well Patient left: in chair;with call bell/phone within reach;with chair alarm set Nurse Communication: Mobility status PT Visit Diagnosis: Difficulty in walking, not elsewhere classified (R26.2);Pain Pain - Right/Left: Right Pain - part of body: Knee     Time: 1610-9604 PT Time Calculation (min) (ACUTE ONLY): 25 min  Charges:  $Gait Training: 23-37 mins $Therapeutic Exercise: 23-37 mins                     Mauro Kaufmann PT Acute Rehabilitation Services Pager [phone] Office [phone]    Micheal Sheen 02/28/2020, 12:21 PM

## 2020-02-28 NOTE — Plan of Care (Signed)
Patient discharged home in stable condition 

## 2020-03-02 LAB — AEROBIC/ANAEROBIC CULTURE W GRAM STAIN (SURGICAL/DEEP WOUND)
Culture: NO GROWTH
Gram Stain: NONE SEEN

## 2020-03-02 NOTE — Discharge Summary (Signed)
Physician Discharge Summary   Patient ID: Kathleen Velazquez MRN: [id number] DOB/AGE: [birth year] 64 y.o.  Admit date: 02/26/2020 Discharge date: 02/28/2020  Primary Diagnosis: Failed right total knee arthroplasty   Admission Diagnoses:  Past Medical History:  Diagnosis Date  . Anxiety   . Arthritis    knees,   . Ejection fraction    Normal, echo, June, 2013  . GERD (gastroesophageal reflux disease)   . Headache(784.0)    hx of migraines   . History of hiatal hernia   . Hypertension   . Irritable bowel syndrome (IBS)   . PONV (postoperative nausea and vomiting)   . Sinus tachycardia    Persistent sinus tachycardia, June, 2013 ( prior monitor in 2007 had shown no significant abnormalities.)   Discharge Diagnoses:   Principal Problem:   Failed total knee arthroplasty (HCC) Active Problems:   Failed total right knee replacement (HCC)  Estimated body mass index is 25.6 kg/m as calculated from the following:   Height as of this encounter: 5\' 2"  (1.575 m).   Weight as of this encounter: 63.5 kg.  Procedure:  Procedure(s) (LRB): TOTAL KNEE REVISION (Right)   Consults: None  HPI: The patient is a 64 year old female with a severely painful right total knee arthroplasty.  She had an episode a few months ago with a marked increase in pain.  She also has significant instability in the knee.  She had a negative infection workup.  Given her significant pain and instability, she presents now for polyethylene versus total knee revision.  Laboratory Data: Admission on 02/26/2020, Discharged on 02/28/2020  Component Date Value Ref Range Status  . Specimen Description 02/26/2020    Final                   Value:KNEE RIGHT Performed at [facility], [address] [place], [place] [address]   . Special Requests 02/26/2020    Final                   Value:NONE Performed at [facility], [address] [place], [place] [address]   . Gram Stain  02/26/2020    Final                   Value:NO WBC SEEN NO ORGANISMS SEEN Gram Stain Report Called to,Read Back By and Verified With: J.LOPEZ RN AT 1440 ON 02/26/20 BY N.THOMPSON Performed at [facility], [address] [place], [place] [address]   . Culture 02/26/2020    Final                   Value:NO GROWTH 4 DAYS NO ANAEROBES ISOLATED; CULTURE IN PROGRESS FOR 5 DAYS Performed at [facility], [address] [place], [place] [address]   . Report Status 02/26/2020 PENDING   Incomplete  . WBC 02/27/2020 8.3  4.0 - 10.5 K/uL Final  . RBC 02/27/2020 3.12* 3.87 - 5.11 MIL/uL Final  . Hemoglobin 02/27/2020 10.0* 12.0 - 15.0 g/dL Final  . HCT 65/78/469607/22/2021 31.4* 36 - 46 % Final  . MCV 02/27/2020 100.6* 80.0 - 100.0 fL Final  . MCH 02/27/2020 32.1  26.0 - 34.0 pg Final  . MCHC 02/27/2020 31.8  30.0 - 36.0 g/dL Final  . RDW 29/52/841307/22/2021 13.0  11.5 - 15.5 % Final  . Platelets 02/27/2020 174  150 - 400 K/uL Final  . nRBC 02/27/2020 0.0  0.0 - 0.2 % Final  Performed at [facility], [address] [place], [place] [address]  . Sodium 02/27/2020 136  135 - 145 mmol/L Final  . Potassium 02/27/2020 4.0  3.5 - 5.1 mmol/L Final  . Chloride 02/27/2020 104  98 - 111 mmol/L Final  . CO2 02/27/2020 22  22 - 32 mmol/L Final  . Glucose, Bld 02/27/2020 256* 70 - 99 mg/dL Final   Glucose reference range applies only to samples taken after fasting for at least 8 hours.  . BUN 02/27/2020 16  8 - 23 mg/dL Final  . Creatinine, Ser 02/27/2020 0.74  0.44 - 1.00 mg/dL Final  . Calcium 93/23/5573 8.3* 8.9 - 10.3 mg/dL Final  . GFR calc non Af Amer 02/27/2020 >60  >60 mL/min Final  . GFR calc Af Amer 02/27/2020 >60  >60 mL/min Final  . Anion gap 02/27/2020 10  5 - 15 Final   Performed at [facility], [address], [place], [place] [address]  . WBC 02/28/2020 10.3  4.0 - 10.5 K/uL Final  . RBC 02/28/2020 2.89* 3.87 - 5.11 MIL/uL Final  .  Hemoglobin 02/28/2020 9.2* 12.0 - 15.0 g/dL Final  . HCT 42/70/6237 29.8* 36 - 46 % Final  . MCV 02/28/2020 103.1* 80.0 - 100.0 fL Final  . MCH 02/28/2020 31.8  26.0 - 34.0 pg Final  . MCHC 02/28/2020 30.9  30.0 - 36.0 g/dL Final  . RDW 62/83/1517 13.2  11.5 - 15.5 % Final  . Platelets 02/28/2020 161  150 - 400 K/uL Final  . nRBC 02/28/2020 0.0  0.0 - 0.2 % Final   Performed at [facility], [address] [place], [place] [address]  . Sodium 02/28/2020 142  135 - 145 mmol/L Final  . Potassium 02/28/2020 4.2  3.5 - 5.1 mmol/L Final  . Chloride 02/28/2020 109  98 - 111 mmol/L Final  . CO2 02/28/2020 24  22 - 32 mmol/L Final  . Glucose, Bld 02/28/2020 121* 70 - 99 mg/dL Final   Glucose reference range applies only to samples taken after fasting for at least 8 hours.  . BUN 02/28/2020 14  8 - 23 mg/dL Final  . Creatinine, Ser 02/28/2020 0.56  0.44 - 1.00 mg/dL Final  . Calcium 37/05/6268 8.6* 8.9 - 10.3 mg/dL Final  . GFR calc non Af Amer 02/28/2020 >60  >60 mL/min Final  . GFR calc Af Amer 02/28/2020 >60  >60 mL/min Final  . Anion gap 02/28/2020 9  5 - 15 Final   Performed at [facility], [address], [place], [place] [address]  Hospital Outpatient Visit on 02/22/2020  Component Date Value Ref Range Status  . SARS Coronavirus 2 02/22/2020 NEGATIVE  NEGATIVE Final   Comment: (NOTE) SARS-CoV-2 target nucleic acids are NOT DETECTED.  The SARS-CoV-2 RNA is generally detectable in upper and lower respiratory specimens during the acute phase of infection. Negative results do not preclude SARS-CoV-2 infection, do not rule out co-infections with other pathogens, and should not be used as the sole basis for treatment or other patient management decisions. Negative results must be combined with clinical observations, patient history, and epidemiological information. The expected result is Negative.  Fact Sheet for  Patients: HairSlick.no  Fact Sheet for Healthcare Providers: quierodirigir.com  This test is not yet approved or cleared by the [place] FDA and  has been authorized for detection and/or diagnosis of SARS-CoV-2 by FDA under an Emergency Use Authorization (EUA). This EUA will remain  in effect (meaning this test can be used) for the duration of the COVID-19 declaration under Se                          ction 564(b)(1) of the Act, 21 U.S.C. section 360bbb-3(b)(1), unless the authorization is terminated or revoked sooner.  Performed at [facility], [address], [place], [place] [address]   Hospital Outpatient Visit on 02/20/2020  Component Date Value Ref Range Status  . MRSA, PCR 02/20/2020 NEGATIVE  NEGATIVE Final  . Staphylococcus aureus 02/20/2020 NEGATIVE  NEGATIVE Final   Comment: (NOTE) The Xpert SA Assay (FDA approved for NASAL specimens in patients 20 years of age and older), is one component of a comprehensive surveillance program. It is not intended to diagnose infection nor to guide or monitor treatment. Performed at [facility] [address] [place], [place] [address]   . aPTT 02/20/2020 27  24 - 36 seconds Final   Performed at [facility], [address], [place], [place] [address]  . WBC 02/20/2020 7.4  4.0 - 10.5 K/uL Final  . RBC 02/20/2020 4.04  3.87 - 5.11 MIL/uL Final  . Hemoglobin 02/20/2020 13.1  12.0 - 15.0 g/dL Final  . HCT 91/47/8295 40.7  36 - 46 % Final  . MCV 02/20/2020 100.7* 80.0 - 100.0 fL Final  . MCH 02/20/2020 32.4  26.0 - 34.0 pg Final  . MCHC 02/20/2020 32.2  30.0 - 36.0 g/dL Final  . RDW 62/13/0865 12.7  11.5 - 15.5 % Final  . Platelets 02/20/2020 217  150 - 400 K/uL Final  . nRBC 02/20/2020 0.0  0.0 - 0.2 % Final   Performed at [facility], [address], [place], [place] [address]  . Sodium 02/20/2020 137  135  - 145 mmol/L Final  . Potassium 02/20/2020 4.9  3.5 - 5.1 mmol/L Final  . Chloride 02/20/2020 102  98 - 111 mmol/L Final  . CO2 02/20/2020 23  22 - 32 mmol/L Final  . Glucose, Bld 02/20/2020 91  70 - 99 mg/dL Final   Glucose reference range applies only to samples taken after fasting for at least 8 hours.  . BUN 02/20/2020 17  8 - 23 mg/dL Final  . Creatinine, Ser 02/20/2020 0.73  0.44 - 1.00 mg/dL Final  . Calcium 62/95/2841 9.3  8.9 - 10.3 mg/dL Final  . Total Protein 02/20/2020 7.5  6.5 - 8.1 g/dL Final  . Albumin 32/44/0102 3.9  3.5 - 5.0 g/dL Final  . AST 72/53/6644 15  15 - 41 U/L Final  . ALT 02/20/2020 14  0 - 44 U/L Final  . Alkaline Phosphatase 02/20/2020 127* 38 - 126 U/L Final  . Total Bilirubin 02/20/2020 0.5  0.3 - 1.2 mg/dL Final  . GFR calc non Af Amer 02/20/2020 >60  >60 mL/min Final  . GFR calc Af Amer 02/20/2020 >60  >60 mL/min Final  . Anion gap 02/20/2020 12  5 - 15 Final   Performed at [facility], [address] [place], [place] [address]  . Prothrombin Time 02/20/2020 13.0  11.4 - 15.2 seconds Final  . INR 02/20/2020 1.0  0.8 - 1.2 Final   Comment: (NOTE) INR goal varies based on device and disease states. Performed at [facility], [address], [place], [place] [address]   . ABO/RH(D) 02/20/2020 O POS   Final  . Antibody Screen  02/20/2020 NEG   Final  . Sample Expiration 02/20/2020 02/29/2020,2359   Final  . Extend sample reason 02/20/2020    Final                   Value:NO TRANSFUSIONS OR PREGNANCY IN THE PAST 3 MONTHS Performed at [facility], [address], [place], [place] [address]      X-Rays:No results found.  EKG: Orders placed or performed during the hospital encounter of 02/20/20  . EKG 12 lead per protocol  . EKG 12 lead per protocol     Hospital Course: Kathleen Velazquez is a 64 y.o. who was admitted to [facility]. They were brought to the operating room on  02/26/2020 and underwent Procedure(s): TOTAL KNEE REVISION.  Patient tolerated the procedure well and was later transferred to the recovery room and then to the orthopaedic floor for postoperative care. They were given PO and IV analgesics for pain control following their surgery. They were given 24 hours of postoperative antibiotics of  Anti-infectives (From admission, onward)   Start     Dose/Rate Route Frequency Ordered Stop   02/26/20 2030  ceFAZolin (ANCEF) IVPB 2g/100 mL premix        2 g 200 mL/hr over 30 Minutes Intravenous Every 6 hours 02/26/20 1700 02/27/20 0303   02/26/20 1230  ceFAZolin (ANCEF) IVPB 2g/100 mL premix        2 g 200 mL/hr over 30 Minutes Intravenous On call to O.R. 02/26/20 1219 02/26/20 1433     and started on DVT prophylaxis in the form of Aspirin.   PT and OT were ordered for total joint protocol. Discharge planning consulted to help with postop disposition and equipment needs. Patient had a good night on the evening of surgery. They started to get up OOB with therapy on POD #0. Hemovac drain was pulled without difficulty on day one. Continued to work with therapy into POD #2. Pt was seen during rounds on day two and was ready to go home pending progress with therapy. Dressing was changed and the incision was clean, dry, and intact. Pt worked with therapy for two additional sessions and was meeting their goals. She was discharged to home later that day in stable condition.  Diet: Regular diet Activity: WBAT Follow-up: in 2 weeks Disposition: Home with outpatient therapy at [facility] in [place] Discharged Condition: stable   Discharge Instructions    Call MD / Call 911   Complete by: As directed    If you experience chest pain or shortness of breath, CALL 911 and be transported to the hospital emergency room.  If you develope a fever above 101 F, pus (white drainage) or increased drainage or redness at the wound, or calf pain, call your surgeon's office.   Change dressing    Complete by: As directed    You may remove the bulky bandage (ACE wrap and gauze) two days after surgery. You will have an adhesive waterproof bandage underneath. Leave this in place until your first follow-up appointment.   Constipation Prevention   Complete by: As directed    Drink plenty of fluids.  Prune juice may be helpful.  You may use a stool softener, such as Colace (over the counter) 100 mg twice a day.  Use MiraLax (over the counter) for constipation as needed.   Diet - low sodium heart healthy   Complete by: As directed    Do not put a pillow under the knee.  Place it under the heel.   Complete by: As directed    Driving restrictions   Complete by: As directed    No driving for two weeks   TED hose   Complete by: As directed    Use stockings (TED hose) for three weeks on both leg(s).  You may remove them at night for sleeping.   Weight bearing as tolerated   Complete by: As directed      Allergies as of 02/28/2020      Reactions   Codeine Nausea And Vomiting      Medication List    STOP taking these medications   sulfamethoxazole-trimethoprim 800-160 MG tablet Commonly known as: BACTRIM DS     TAKE these medications   aspirin 325 MG EC tablet Take 1 tablet (325 mg total) by mouth 2 (two) times daily for 19 days. Then take one 81 mg aspirin once a day for three weeks. Then discontinue aspirin.   gabapentin 300 MG capsule Commonly known as: NEURONTIN Take a 300 mg capsule three times a day for two weeks following surgery.Then take a 300 mg capsule two times a day for two weeks. Then take a 300 mg capsule once a day for two weeks. Then discontinue.   gemfibrozil 600 MG tablet Commonly known as: LOPID Take 600 mg by mouth 2 (two) times daily before a meal.   HYDROmorphone 2 MG tablet Commonly known as: DILAUDID Take 1-2 tablets (2-4 mg total) by mouth every 6 (six) hours as needed for severe pain.   methocarbamol 500 MG tablet Commonly known as: ROBAXIN Take 1  tablet (500 mg total) by mouth every 6 (six) hours as needed for muscle spasms.   metoprolol tartrate 25 MG tablet Commonly known as: LOPRESSOR Take 25 mg by mouth 2 (two) times daily.   traMADol 50 MG tablet Commonly known as: ULTRAM Take 1-2 tablets (50-100 mg total) by mouth every 6 (six) hours as needed for moderate pain.            Discharge Care Instructions  (From admission, onward)         Start     Ordered   02/28/20 0000  Weight bearing as tolerated        02/28/20 0755   02/28/20 0000  Change dressing       Comments: You may remove the bulky bandage (ACE wrap and gauze) two days after surgery. You will have an adhesive waterproof bandage underneath. Leave this in place until your first follow-up appointment.   02/28/20 0755          Follow-up Information    Ollen Gross, MD. Schedule an appointment as soon as possible for a visit on 03/10/2020.   Specialty: Orthopedic Surgery Contact information: [address] [place] [address] [phone]               Signed: Arther Abbott, PA-C Orthopedic Surgery 03/02/2020, 9:15 AM

## 2020-03-03 DIAGNOSIS — Z96651 Presence of right artificial knee joint: Secondary | ICD-10-CM | POA: Diagnosis not present

## 2020-03-03 DIAGNOSIS — M25661 Stiffness of right knee, not elsewhere classified: Secondary | ICD-10-CM | POA: Diagnosis not present

## 2020-03-03 DIAGNOSIS — Z471 Aftercare following joint replacement surgery: Secondary | ICD-10-CM | POA: Diagnosis not present

## 2020-03-03 DIAGNOSIS — R2689 Other abnormalities of gait and mobility: Secondary | ICD-10-CM | POA: Diagnosis not present

## 2020-03-03 DIAGNOSIS — M6281 Muscle weakness (generalized): Secondary | ICD-10-CM | POA: Diagnosis not present

## 2020-03-04 DIAGNOSIS — R2689 Other abnormalities of gait and mobility: Secondary | ICD-10-CM | POA: Diagnosis not present

## 2020-03-04 DIAGNOSIS — M6281 Muscle weakness (generalized): Secondary | ICD-10-CM | POA: Diagnosis not present

## 2020-03-04 DIAGNOSIS — M25661 Stiffness of right knee, not elsewhere classified: Secondary | ICD-10-CM | POA: Diagnosis not present

## 2020-03-04 DIAGNOSIS — Z96651 Presence of right artificial knee joint: Secondary | ICD-10-CM | POA: Diagnosis not present

## 2020-03-04 DIAGNOSIS — Z471 Aftercare following joint replacement surgery: Secondary | ICD-10-CM | POA: Diagnosis not present

## 2020-03-05 DIAGNOSIS — Z96651 Presence of right artificial knee joint: Secondary | ICD-10-CM | POA: Diagnosis not present

## 2020-03-05 DIAGNOSIS — M25661 Stiffness of right knee, not elsewhere classified: Secondary | ICD-10-CM | POA: Diagnosis not present

## 2020-03-05 DIAGNOSIS — Z471 Aftercare following joint replacement surgery: Secondary | ICD-10-CM | POA: Diagnosis not present

## 2020-03-05 DIAGNOSIS — R2689 Other abnormalities of gait and mobility: Secondary | ICD-10-CM | POA: Diagnosis not present

## 2020-03-05 DIAGNOSIS — M6281 Muscle weakness (generalized): Secondary | ICD-10-CM | POA: Diagnosis not present

## 2020-03-09 DIAGNOSIS — M25661 Stiffness of right knee, not elsewhere classified: Secondary | ICD-10-CM | POA: Diagnosis not present

## 2020-03-09 DIAGNOSIS — M6281 Muscle weakness (generalized): Secondary | ICD-10-CM | POA: Diagnosis not present

## 2020-03-09 DIAGNOSIS — R2689 Other abnormalities of gait and mobility: Secondary | ICD-10-CM | POA: Diagnosis not present

## 2020-03-09 DIAGNOSIS — Z96651 Presence of right artificial knee joint: Secondary | ICD-10-CM | POA: Diagnosis not present

## 2020-03-09 DIAGNOSIS — Z471 Aftercare following joint replacement surgery: Secondary | ICD-10-CM | POA: Diagnosis not present

## 2020-03-11 DIAGNOSIS — M6281 Muscle weakness (generalized): Secondary | ICD-10-CM | POA: Diagnosis not present

## 2020-03-11 DIAGNOSIS — Z96651 Presence of right artificial knee joint: Secondary | ICD-10-CM | POA: Diagnosis not present

## 2020-03-11 DIAGNOSIS — Z471 Aftercare following joint replacement surgery: Secondary | ICD-10-CM | POA: Diagnosis not present

## 2020-03-11 DIAGNOSIS — R2689 Other abnormalities of gait and mobility: Secondary | ICD-10-CM | POA: Diagnosis not present

## 2020-03-11 DIAGNOSIS — M25661 Stiffness of right knee, not elsewhere classified: Secondary | ICD-10-CM | POA: Diagnosis not present

## 2020-03-13 DIAGNOSIS — Z96651 Presence of right artificial knee joint: Secondary | ICD-10-CM | POA: Diagnosis not present

## 2020-03-13 DIAGNOSIS — R2689 Other abnormalities of gait and mobility: Secondary | ICD-10-CM | POA: Diagnosis not present

## 2020-03-13 DIAGNOSIS — M6281 Muscle weakness (generalized): Secondary | ICD-10-CM | POA: Diagnosis not present

## 2020-03-13 DIAGNOSIS — Z471 Aftercare following joint replacement surgery: Secondary | ICD-10-CM | POA: Diagnosis not present

## 2020-03-13 DIAGNOSIS — M25661 Stiffness of right knee, not elsewhere classified: Secondary | ICD-10-CM | POA: Diagnosis not present

## 2020-03-16 DIAGNOSIS — Z471 Aftercare following joint replacement surgery: Secondary | ICD-10-CM | POA: Diagnosis not present

## 2020-03-16 DIAGNOSIS — R2689 Other abnormalities of gait and mobility: Secondary | ICD-10-CM | POA: Diagnosis not present

## 2020-03-16 DIAGNOSIS — M6281 Muscle weakness (generalized): Secondary | ICD-10-CM | POA: Diagnosis not present

## 2020-03-16 DIAGNOSIS — M25661 Stiffness of right knee, not elsewhere classified: Secondary | ICD-10-CM | POA: Diagnosis not present

## 2020-03-16 DIAGNOSIS — Z96651 Presence of right artificial knee joint: Secondary | ICD-10-CM | POA: Diagnosis not present

## 2020-03-18 DIAGNOSIS — M25661 Stiffness of right knee, not elsewhere classified: Secondary | ICD-10-CM | POA: Diagnosis not present

## 2020-03-18 DIAGNOSIS — Z96651 Presence of right artificial knee joint: Secondary | ICD-10-CM | POA: Diagnosis not present

## 2020-03-18 DIAGNOSIS — R2689 Other abnormalities of gait and mobility: Secondary | ICD-10-CM | POA: Diagnosis not present

## 2020-03-18 DIAGNOSIS — Z471 Aftercare following joint replacement surgery: Secondary | ICD-10-CM | POA: Diagnosis not present

## 2020-03-18 DIAGNOSIS — M6281 Muscle weakness (generalized): Secondary | ICD-10-CM | POA: Diagnosis not present

## 2020-03-20 DIAGNOSIS — M6281 Muscle weakness (generalized): Secondary | ICD-10-CM | POA: Diagnosis not present

## 2020-03-20 DIAGNOSIS — Z471 Aftercare following joint replacement surgery: Secondary | ICD-10-CM | POA: Diagnosis not present

## 2020-03-20 DIAGNOSIS — R2689 Other abnormalities of gait and mobility: Secondary | ICD-10-CM | POA: Diagnosis not present

## 2020-03-20 DIAGNOSIS — M25661 Stiffness of right knee, not elsewhere classified: Secondary | ICD-10-CM | POA: Diagnosis not present

## 2020-03-20 DIAGNOSIS — Z96651 Presence of right artificial knee joint: Secondary | ICD-10-CM | POA: Diagnosis not present

## 2020-03-23 DIAGNOSIS — Z96651 Presence of right artificial knee joint: Secondary | ICD-10-CM | POA: Diagnosis not present

## 2020-03-23 DIAGNOSIS — M25661 Stiffness of right knee, not elsewhere classified: Secondary | ICD-10-CM | POA: Diagnosis not present

## 2020-03-23 DIAGNOSIS — M6281 Muscle weakness (generalized): Secondary | ICD-10-CM | POA: Diagnosis not present

## 2020-03-23 DIAGNOSIS — R2689 Other abnormalities of gait and mobility: Secondary | ICD-10-CM | POA: Diagnosis not present

## 2020-03-23 DIAGNOSIS — Z471 Aftercare following joint replacement surgery: Secondary | ICD-10-CM | POA: Diagnosis not present

## 2020-03-25 DIAGNOSIS — Z96651 Presence of right artificial knee joint: Secondary | ICD-10-CM | POA: Diagnosis not present

## 2020-03-25 DIAGNOSIS — Z471 Aftercare following joint replacement surgery: Secondary | ICD-10-CM | POA: Diagnosis not present

## 2020-03-25 DIAGNOSIS — M25661 Stiffness of right knee, not elsewhere classified: Secondary | ICD-10-CM | POA: Diagnosis not present

## 2020-03-25 DIAGNOSIS — R2689 Other abnormalities of gait and mobility: Secondary | ICD-10-CM | POA: Diagnosis not present

## 2020-03-25 DIAGNOSIS — M6281 Muscle weakness (generalized): Secondary | ICD-10-CM | POA: Diagnosis not present

## 2020-03-30 DIAGNOSIS — M25661 Stiffness of right knee, not elsewhere classified: Secondary | ICD-10-CM | POA: Diagnosis not present

## 2020-03-30 DIAGNOSIS — Z471 Aftercare following joint replacement surgery: Secondary | ICD-10-CM | POA: Diagnosis not present

## 2020-03-30 DIAGNOSIS — R2689 Other abnormalities of gait and mobility: Secondary | ICD-10-CM | POA: Diagnosis not present

## 2020-03-30 DIAGNOSIS — Z96651 Presence of right artificial knee joint: Secondary | ICD-10-CM | POA: Diagnosis not present

## 2020-03-30 DIAGNOSIS — M6281 Muscle weakness (generalized): Secondary | ICD-10-CM | POA: Diagnosis not present

## 2020-03-31 DIAGNOSIS — Z471 Aftercare following joint replacement surgery: Secondary | ICD-10-CM | POA: Diagnosis not present

## 2020-03-31 DIAGNOSIS — Z96651 Presence of right artificial knee joint: Secondary | ICD-10-CM | POA: Diagnosis not present

## 2020-04-03 DIAGNOSIS — M25661 Stiffness of right knee, not elsewhere classified: Secondary | ICD-10-CM | POA: Diagnosis not present

## 2020-04-03 DIAGNOSIS — R2689 Other abnormalities of gait and mobility: Secondary | ICD-10-CM | POA: Diagnosis not present

## 2020-04-03 DIAGNOSIS — Z471 Aftercare following joint replacement surgery: Secondary | ICD-10-CM | POA: Diagnosis not present

## 2020-04-03 DIAGNOSIS — M6281 Muscle weakness (generalized): Secondary | ICD-10-CM | POA: Diagnosis not present

## 2020-04-03 DIAGNOSIS — Z96651 Presence of right artificial knee joint: Secondary | ICD-10-CM | POA: Diagnosis not present

## 2020-04-07 DIAGNOSIS — M25661 Stiffness of right knee, not elsewhere classified: Secondary | ICD-10-CM | POA: Diagnosis not present

## 2020-04-07 DIAGNOSIS — Z96651 Presence of right artificial knee joint: Secondary | ICD-10-CM | POA: Diagnosis not present

## 2020-04-07 DIAGNOSIS — R2689 Other abnormalities of gait and mobility: Secondary | ICD-10-CM | POA: Diagnosis not present

## 2020-04-07 DIAGNOSIS — M6281 Muscle weakness (generalized): Secondary | ICD-10-CM | POA: Diagnosis not present

## 2020-04-07 DIAGNOSIS — Z471 Aftercare following joint replacement surgery: Secondary | ICD-10-CM | POA: Diagnosis not present

## 2020-04-09 DIAGNOSIS — R2689 Other abnormalities of gait and mobility: Secondary | ICD-10-CM | POA: Diagnosis not present

## 2020-04-09 DIAGNOSIS — M25661 Stiffness of right knee, not elsewhere classified: Secondary | ICD-10-CM | POA: Diagnosis not present

## 2020-04-09 DIAGNOSIS — Z96651 Presence of right artificial knee joint: Secondary | ICD-10-CM | POA: Diagnosis not present

## 2020-04-09 DIAGNOSIS — M6281 Muscle weakness (generalized): Secondary | ICD-10-CM | POA: Diagnosis not present

## 2020-04-09 DIAGNOSIS — Z471 Aftercare following joint replacement surgery: Secondary | ICD-10-CM | POA: Diagnosis not present

## 2020-04-14 DIAGNOSIS — R2689 Other abnormalities of gait and mobility: Secondary | ICD-10-CM | POA: Diagnosis not present

## 2020-04-14 DIAGNOSIS — Z471 Aftercare following joint replacement surgery: Secondary | ICD-10-CM | POA: Diagnosis not present

## 2020-04-14 DIAGNOSIS — Z96651 Presence of right artificial knee joint: Secondary | ICD-10-CM | POA: Diagnosis not present

## 2020-04-14 DIAGNOSIS — M6281 Muscle weakness (generalized): Secondary | ICD-10-CM | POA: Diagnosis not present

## 2020-04-14 DIAGNOSIS — M25661 Stiffness of right knee, not elsewhere classified: Secondary | ICD-10-CM | POA: Diagnosis not present

## 2020-04-17 DIAGNOSIS — R2689 Other abnormalities of gait and mobility: Secondary | ICD-10-CM | POA: Diagnosis not present

## 2020-04-17 DIAGNOSIS — M6281 Muscle weakness (generalized): Secondary | ICD-10-CM | POA: Diagnosis not present

## 2020-04-17 DIAGNOSIS — M25661 Stiffness of right knee, not elsewhere classified: Secondary | ICD-10-CM | POA: Diagnosis not present

## 2020-04-17 DIAGNOSIS — Z96651 Presence of right artificial knee joint: Secondary | ICD-10-CM | POA: Diagnosis not present

## 2020-04-17 DIAGNOSIS — Z471 Aftercare following joint replacement surgery: Secondary | ICD-10-CM | POA: Diagnosis not present

## 2020-04-21 DIAGNOSIS — Z96651 Presence of right artificial knee joint: Secondary | ICD-10-CM | POA: Diagnosis not present

## 2020-04-21 DIAGNOSIS — R2689 Other abnormalities of gait and mobility: Secondary | ICD-10-CM | POA: Diagnosis not present

## 2020-04-21 DIAGNOSIS — M25661 Stiffness of right knee, not elsewhere classified: Secondary | ICD-10-CM | POA: Diagnosis not present

## 2020-04-21 DIAGNOSIS — M6281 Muscle weakness (generalized): Secondary | ICD-10-CM | POA: Diagnosis not present

## 2020-04-21 DIAGNOSIS — Z471 Aftercare following joint replacement surgery: Secondary | ICD-10-CM | POA: Diagnosis not present

## 2020-04-23 DIAGNOSIS — Z96651 Presence of right artificial knee joint: Secondary | ICD-10-CM | POA: Diagnosis not present

## 2020-04-23 DIAGNOSIS — M25661 Stiffness of right knee, not elsewhere classified: Secondary | ICD-10-CM | POA: Diagnosis not present

## 2020-04-23 DIAGNOSIS — Z471 Aftercare following joint replacement surgery: Secondary | ICD-10-CM | POA: Diagnosis not present

## 2020-04-23 DIAGNOSIS — M6281 Muscle weakness (generalized): Secondary | ICD-10-CM | POA: Diagnosis not present

## 2020-04-23 DIAGNOSIS — R2689 Other abnormalities of gait and mobility: Secondary | ICD-10-CM | POA: Diagnosis not present

## 2020-04-28 DIAGNOSIS — Z96651 Presence of right artificial knee joint: Secondary | ICD-10-CM | POA: Diagnosis not present

## 2020-04-28 DIAGNOSIS — M6281 Muscle weakness (generalized): Secondary | ICD-10-CM | POA: Diagnosis not present

## 2020-04-28 DIAGNOSIS — M25661 Stiffness of right knee, not elsewhere classified: Secondary | ICD-10-CM | POA: Diagnosis not present

## 2020-04-28 DIAGNOSIS — R2689 Other abnormalities of gait and mobility: Secondary | ICD-10-CM | POA: Diagnosis not present

## 2020-04-28 DIAGNOSIS — Z471 Aftercare following joint replacement surgery: Secondary | ICD-10-CM | POA: Diagnosis not present

## 2020-05-04 DIAGNOSIS — Z6823 Body mass index (BMI) 23.0-23.9, adult: Secondary | ICD-10-CM | POA: Diagnosis not present

## 2020-05-04 DIAGNOSIS — L03115 Cellulitis of right lower limb: Secondary | ICD-10-CM | POA: Diagnosis not present

## 2020-05-04 DIAGNOSIS — M109 Gout, unspecified: Secondary | ICD-10-CM | POA: Diagnosis not present

## 2020-05-04 DIAGNOSIS — I10 Essential (primary) hypertension: Secondary | ICD-10-CM | POA: Diagnosis not present

## 2020-05-04 DIAGNOSIS — E7849 Other hyperlipidemia: Secondary | ICD-10-CM | POA: Diagnosis not present

## 2020-05-04 DIAGNOSIS — Z0000 Encounter for general adult medical examination without abnormal findings: Secondary | ICD-10-CM | POA: Diagnosis not present

## 2020-09-03 DIAGNOSIS — M109 Gout, unspecified: Secondary | ICD-10-CM | POA: Diagnosis not present

## 2020-09-03 DIAGNOSIS — M25561 Pain in right knee: Secondary | ICD-10-CM | POA: Diagnosis not present

## 2020-09-03 DIAGNOSIS — Z6824 Body mass index (BMI) 24.0-24.9, adult: Secondary | ICD-10-CM | POA: Diagnosis not present

## 2020-09-03 DIAGNOSIS — I10 Essential (primary) hypertension: Secondary | ICD-10-CM | POA: Diagnosis not present

## 2020-09-03 DIAGNOSIS — E7849 Other hyperlipidemia: Secondary | ICD-10-CM | POA: Diagnosis not present

## 2020-09-24 DIAGNOSIS — Z96651 Presence of right artificial knee joint: Secondary | ICD-10-CM | POA: Diagnosis not present

## 2020-09-24 DIAGNOSIS — M25869 Other specified joint disorders, unspecified knee: Secondary | ICD-10-CM | POA: Diagnosis not present

## 2020-10-27 DIAGNOSIS — M65861 Other synovitis and tenosynovitis, right lower leg: Secondary | ICD-10-CM | POA: Diagnosis not present

## 2020-10-27 DIAGNOSIS — M67261 Synovial hypertrophy, not elsewhere classified, right lower leg: Secondary | ICD-10-CM | POA: Diagnosis not present

## 2020-10-27 DIAGNOSIS — Z96651 Presence of right artificial knee joint: Secondary | ICD-10-CM | POA: Diagnosis not present

## 2020-12-23 DIAGNOSIS — Z6824 Body mass index (BMI) 24.0-24.9, adult: Secondary | ICD-10-CM | POA: Diagnosis not present

## 2020-12-23 DIAGNOSIS — I10 Essential (primary) hypertension: Secondary | ICD-10-CM | POA: Diagnosis not present

## 2020-12-23 DIAGNOSIS — M109 Gout, unspecified: Secondary | ICD-10-CM | POA: Diagnosis not present

## 2020-12-23 DIAGNOSIS — E7849 Other hyperlipidemia: Secondary | ICD-10-CM | POA: Diagnosis not present

## 2020-12-23 DIAGNOSIS — Z0000 Encounter for general adult medical examination without abnormal findings: Secondary | ICD-10-CM | POA: Diagnosis not present

## 2020-12-23 DIAGNOSIS — M25561 Pain in right knee: Secondary | ICD-10-CM | POA: Diagnosis not present

## 2021-04-07 DIAGNOSIS — I10 Essential (primary) hypertension: Secondary | ICD-10-CM | POA: Diagnosis not present

## 2021-04-07 DIAGNOSIS — E7849 Other hyperlipidemia: Secondary | ICD-10-CM | POA: Diagnosis not present

## 2021-04-26 DIAGNOSIS — M25561 Pain in right knee: Secondary | ICD-10-CM | POA: Diagnosis not present

## 2021-04-26 DIAGNOSIS — I10 Essential (primary) hypertension: Secondary | ICD-10-CM | POA: Diagnosis not present

## 2021-04-26 DIAGNOSIS — M109 Gout, unspecified: Secondary | ICD-10-CM | POA: Diagnosis not present

## 2021-04-26 DIAGNOSIS — Z6824 Body mass index (BMI) 24.0-24.9, adult: Secondary | ICD-10-CM | POA: Diagnosis not present

## 2021-04-26 DIAGNOSIS — M25552 Pain in left hip: Secondary | ICD-10-CM | POA: Diagnosis not present

## 2021-04-26 DIAGNOSIS — E7849 Other hyperlipidemia: Secondary | ICD-10-CM | POA: Diagnosis not present

## 2022-06-22 DIAGNOSIS — I10 Essential (primary) hypertension: Secondary | ICD-10-CM | POA: Diagnosis not present

## 2022-06-22 DIAGNOSIS — M25561 Pain in right knee: Secondary | ICD-10-CM | POA: Diagnosis not present

## 2022-06-22 DIAGNOSIS — M25552 Pain in left hip: Secondary | ICD-10-CM | POA: Diagnosis not present

## 2022-06-22 DIAGNOSIS — Z6824 Body mass index (BMI) 24.0-24.9, adult: Secondary | ICD-10-CM | POA: Diagnosis not present

## 2022-06-22 DIAGNOSIS — M10362 Gout due to renal impairment, left knee: Secondary | ICD-10-CM | POA: Diagnosis not present

## 2022-06-22 DIAGNOSIS — M109 Gout, unspecified: Secondary | ICD-10-CM | POA: Diagnosis not present

## 2022-06-22 DIAGNOSIS — E7849 Other hyperlipidemia: Secondary | ICD-10-CM | POA: Diagnosis not present

## 2022-11-18 ENCOUNTER — Encounter (HOSPITAL_COMMUNITY): Payer: Self-pay | Admitting: Emergency Medicine

## 2022-11-18 ENCOUNTER — Emergency Department (HOSPITAL_COMMUNITY): Payer: Medicare Other

## 2022-11-18 ENCOUNTER — Inpatient Hospital Stay (HOSPITAL_COMMUNITY)
Admission: EM | Admit: 2022-11-18 | Discharge: 2022-11-27 | DRG: 286 | Disposition: A | Discharge status: Home / Self Care | Payer: Medicare Other | Attending: Internal Medicine | Admitting: Internal Medicine

## 2022-11-18 ENCOUNTER — Other Ambulatory Visit: Payer: Self-pay

## 2022-11-18 DIAGNOSIS — R531 Weakness: Secondary | ICD-10-CM | POA: Diagnosis not present

## 2022-11-18 DIAGNOSIS — E785 Hyperlipidemia, unspecified: Secondary | ICD-10-CM | POA: Insufficient documentation

## 2022-11-18 DIAGNOSIS — I428 Other cardiomyopathies: Secondary | ICD-10-CM | POA: Diagnosis present

## 2022-11-18 DIAGNOSIS — I110 Hypertensive heart disease with heart failure: Principal | ICD-10-CM | POA: Diagnosis present

## 2022-11-18 DIAGNOSIS — Z79899 Other long term (current) drug therapy: Secondary | ICD-10-CM

## 2022-11-18 DIAGNOSIS — Z85828 Personal history of other malignant neoplasm of skin: Secondary | ICD-10-CM

## 2022-11-18 DIAGNOSIS — E8809 Other disorders of plasma-protein metabolism, not elsewhere classified: Secondary | ICD-10-CM | POA: Diagnosis present

## 2022-11-18 DIAGNOSIS — I5082 Biventricular heart failure: Secondary | ICD-10-CM | POA: Diagnosis present

## 2022-11-18 DIAGNOSIS — R000 Tachycardia, unspecified: Secondary | ICD-10-CM | POA: Diagnosis present

## 2022-11-18 DIAGNOSIS — K589 Irritable bowel syndrome without diarrhea: Secondary | ICD-10-CM | POA: Diagnosis present

## 2022-11-18 DIAGNOSIS — Z885 Allergy status to narcotic agent status: Secondary | ICD-10-CM

## 2022-11-18 DIAGNOSIS — I493 Ventricular premature depolarization: Secondary | ICD-10-CM | POA: Diagnosis present

## 2022-11-18 DIAGNOSIS — R32 Unspecified urinary incontinence: Secondary | ICD-10-CM | POA: Diagnosis present

## 2022-11-18 DIAGNOSIS — F419 Anxiety disorder, unspecified: Secondary | ICD-10-CM | POA: Diagnosis present

## 2022-11-18 DIAGNOSIS — E781 Pure hyperglyceridemia: Secondary | ICD-10-CM | POA: Diagnosis present

## 2022-11-18 DIAGNOSIS — L03113 Cellulitis of right upper limb: Secondary | ICD-10-CM | POA: Diagnosis present

## 2022-11-18 DIAGNOSIS — I5043 Acute on chronic combined systolic (congestive) and diastolic (congestive) heart failure: Secondary | ICD-10-CM | POA: Diagnosis present

## 2022-11-18 DIAGNOSIS — E876 Hypokalemia: Secondary | ICD-10-CM

## 2022-11-18 DIAGNOSIS — I959 Hypotension, unspecified: Secondary | ICD-10-CM | POA: Diagnosis present

## 2022-11-18 DIAGNOSIS — F1021 Alcohol dependence, in remission: Secondary | ICD-10-CM | POA: Diagnosis present

## 2022-11-18 DIAGNOSIS — I2510 Atherosclerotic heart disease of native coronary artery without angina pectoris: Secondary | ICD-10-CM | POA: Diagnosis present

## 2022-11-18 DIAGNOSIS — I50810 Right heart failure, unspecified: Secondary | ICD-10-CM

## 2022-11-18 DIAGNOSIS — Z96651 Presence of right artificial knee joint: Secondary | ICD-10-CM | POA: Diagnosis present

## 2022-11-18 DIAGNOSIS — K219 Gastro-esophageal reflux disease without esophagitis: Secondary | ICD-10-CM | POA: Diagnosis present

## 2022-11-18 DIAGNOSIS — K625 Hemorrhage of anus and rectum: Secondary | ICD-10-CM

## 2022-11-18 DIAGNOSIS — I3139 Other pericardial effusion (noninflammatory): Secondary | ICD-10-CM | POA: Diagnosis present

## 2022-11-18 DIAGNOSIS — I5041 Acute combined systolic (congestive) and diastolic (congestive) heart failure: Secondary | ICD-10-CM

## 2022-11-18 DIAGNOSIS — I472 Ventricular tachycardia, unspecified: Secondary | ICD-10-CM | POA: Diagnosis not present

## 2022-11-18 LAB — HEPATIC FUNCTION PANEL
ALT: 22 U/L (ref 0–44)
AST: 41 U/L (ref 15–41)
Albumin: 3.7 g/dL (ref 3.5–5.0)
Alkaline Phosphatase: 94 U/L (ref 38–126)
Bilirubin, Direct: 0.2 mg/dL (ref 0.0–0.2)
Indirect Bilirubin: 1.2 mg/dL — ABNORMAL HIGH (ref 0.3–0.9)
Total Bilirubin: 1.4 mg/dL — ABNORMAL HIGH (ref 0.3–1.2)
Total Protein: 6.9 g/dL (ref 6.5–8.1)

## 2022-11-18 LAB — CBC
HCT: 41.6 % (ref 36.0–46.0)
Hemoglobin: 14.5 g/dL (ref 12.0–15.0)
MCH: 34.4 pg — ABNORMAL HIGH (ref 26.0–34.0)
MCHC: 34.9 g/dL (ref 30.0–36.0)
MCV: 98.8 fL (ref 80.0–100.0)
Platelets: 156 10*3/uL (ref 150–400)
RBC: 4.21 MIL/uL (ref 3.87–5.11)
RDW: 14.3 % (ref 11.5–15.5)
WBC: 6.9 10*3/uL (ref 4.0–10.5)
nRBC: 0 % (ref 0.0–0.2)

## 2022-11-18 LAB — CBG MONITORING, ED: Glucose-Capillary: 122 mg/dL — ABNORMAL HIGH (ref 70–99)

## 2022-11-18 LAB — BASIC METABOLIC PANEL
Anion gap: 15 (ref 5–15)
BUN: 11 mg/dL (ref 8–23)
CO2: 29 mmol/L (ref 22–32)
Calcium: 8.3 mg/dL — ABNORMAL LOW (ref 8.9–10.3)
Chloride: 99 mmol/L (ref 98–111)
Creatinine, Ser: 0.75 mg/dL (ref 0.44–1.00)
GFR, Estimated: >60 mL/min (ref >60)
Glucose, Bld: 101 mg/dL — ABNORMAL HIGH (ref 70–99)
Potassium: <2 mmol/L — CL (ref 3.5–5.1)
Sodium: 143 mmol/L (ref 135–145)

## 2022-11-18 MED ORDER — SODIUM CHLORIDE 0.9 % IV BOLUS
1000.0000 mL | Freq: Once | INTRAVENOUS | Status: AC
  Administered 2022-11-18: 1000 mL via INTRAVENOUS

## 2022-11-18 MED ORDER — OXYCODONE HCL 5 MG PO TABS: 5.0000 mg | ORAL_TABLET | ORAL | Status: DC | PRN

## 2022-11-18 MED ORDER — POTASSIUM CHLORIDE 10 MEQ/100ML IV SOLN
10.0000 meq | Freq: Once | INTRAVENOUS | Status: AC
  Administered 2022-11-18: 10 meq via INTRAVENOUS
  Filled 2022-11-18: qty 100

## 2022-11-18 MED ORDER — POTASSIUM CHLORIDE 20 MEQ PO PACK
40.0000 meq | PACK | Freq: Once | ORAL | Status: AC
  Administered 2022-11-18: 40 meq via ORAL
  Filled 2022-11-18: qty 2

## 2022-11-18 MED ORDER — ACETAMINOPHEN 325 MG PO TABS
650.0000 mg | ORAL_TABLET | Freq: Four times a day (QID) | ORAL | Status: DC | PRN
  Administered 2022-11-18: 650 mg via ORAL
  Filled 2022-11-18: qty 2

## 2022-11-18 MED ORDER — ACETAMINOPHEN 650 MG RE SUPP: 650.0000 mg | Freq: Four times a day (QID) | RECTAL | Status: DC | PRN

## 2022-11-18 MED ORDER — POTASSIUM CHLORIDE IN NACL 40-0.9 MEQ/L-% IV SOLN
INTRAVENOUS | Status: DC
  Filled 2022-11-18 (×5): qty 1000

## 2022-11-18 MED ORDER — METOPROLOL TARTRATE 5 MG/5ML IV SOLN
5.0000 mg | Freq: Once | INTRAVENOUS | Status: AC
  Administered 2022-11-18: 5 mg via INTRAVENOUS
  Filled 2022-11-18: qty 5

## 2022-11-18 NOTE — ED Notes (Signed)
Pt not tachy in triage, but after transporting her to room and placing on monitor HR was 160-180's. EKG captures and given to Dr. Estell Harpin

## 2022-11-18 NOTE — ED Triage Notes (Signed)
Pt arrives POV with family c/o right arm weakness since sometime last night. Pt also has had increased bilateral leg weakness.

## 2022-11-18 NOTE — ED Provider Notes (Signed)
[place] EMERGENCY DEPARTMENT AT [facility] Provider Note   CSN: [id number] Arrival date & time: 11/18/22  1855     History {Add pertinent medical, surgical, social history, OB history to HPI:1} Chief Complaint  Patient presents with   Weakness    Kathleen Velazquez is a 67 y.o. female.  Patient has a history of hypertension and atrial tachycardia.  She states last night she noticed her legs are getting weak in her right arm and then this morning she was unable to move her right arm   Weakness      Home Medications Prior to Admission medications   Medication Sig Start Date End Date Taking? Authorizing Provider  allopurinol (ZYLOPRIM) 100 MG tablet Take 100 mg by mouth daily. 06/22/22  Yes [provider]  gemfibrozil (LOPID) 600 MG tablet Take 600 mg by mouth 2 (two) times daily before a meal.   Yes [provider]  ibuprofen (ADVIL) 200 MG tablet Take 800 mg by mouth every 6 (six) hours as needed for headache or moderate pain.   Yes [provider]  metoprolol tartrate (LOPRESSOR) 25 MG tablet Take 25 mg by mouth 2 (two) times daily.   Yes [provider]      Allergies    Codeine    Review of Systems   Review of Systems  Neurological:  Positive for weakness.    Physical Exam Updated Vital Signs BP 117/64   Pulse 90   Temp 98.8 F (37.1 C) (Oral)   Resp (!) 21   Ht  (1.575 m)   Wt 63.5 kg   SpO2 94%   BMI 25.60 kg/m  Physical Exam  ED Results / Procedures / Treatments   Labs (all labs ordered are listed, but only abnormal results are displayed) Labs Reviewed  BASIC METABOLIC PANEL - Abnormal; Notable for the following components:      Result Value   Potassium <2.0 (*)    Glucose, Bld 101 (*)    Calcium 8.3 (*)    All other components within normal limits  CBC - Abnormal; Notable for the following components:   MCH 34.4 (*)    All other components within normal limits  HEPATIC FUNCTION PANEL -  Abnormal; Notable for the following components:   Total Bilirubin 1.4 (*)    Indirect Bilirubin 1.2 (*)    All other components within normal limits  CBG MONITORING, ED - Abnormal; Notable for the following components:   Glucose-Capillary 122 (*)    All other components within normal limits  URINALYSIS, ROUTINE W REFLEX MICROSCOPIC    EKG None  Radiology CT Head Wo Contrast  Result Date: 11/18/2022 CLINICAL DATA:  CVA, right arm weakness EXAM: CT HEAD WITHOUT CONTRAST TECHNIQUE: Contiguous axial images were obtained from the base of the skull through the vertex without intravenous contrast. RADIATION DOSE REDUCTION: This exam was performed according to the departmental dose-optimization program which includes automated exposure control, adjustment of the mA and/or kV according to patient size and/or use of iterative reconstruction technique. COMPARISON:  07/27/2016 FINDINGS: Brain: No acute intracranial findings are seen. There are no signs of bleeding within the cranium. Cortical sulci are prominent. Vascular: Scattered arterial calcifications are seen. Skull: No acute findings are seen. Sinuses/Orbits: There is mucosal thickening in sphenoid sinus. There are no air-fluid levels in the sinuses. Other: None IMPRESSION: No acute intracranial findings are seen in noncontrast CT brain. Atrophy. Chronic sphenoid sinusitis. Electronically Signed   By: Rhae Hammock  Rathinasamy M.D.   On: 11/18/2022 20:55    Procedures Procedures  {Document cardiac monitor, telemetry assessment procedure when appropriate:1}  Medications Ordered in ED Medications  potassium chloride 10 mEq in 100 mL IVPB (10 mEq Intravenous New Bag/Given 11/18/22 2105)  potassium chloride 10 mEq in 100 mL IVPB (0 mEq Intravenous Stopped 11/18/22 2123)  sodium chloride 0.9 % bolus 1,000 mL (1,000 mLs Intravenous New Bag/Given 11/18/22 2104)  metoprolol tartrate (LOPRESSOR) injection 5 mg (5 mg Intravenous Given 11/18/22 2058)    ED  Course/ Medical Decision Making/ A&P  CRITICAL CARE Performed by: Bethann Berkshire Total critical care time: 45 minutes Critical care time was exclusive of separately billable procedures and treating other patients. Critical care was necessary to treat or prevent imminent or life-threatening deterioration. Critical care was time spent personally by me on the following activities: development of treatment plan with patient and/or surrogate as well as nursing, discussions with consultants, evaluation of patient's response to treatment, examination of patient, obtaining history from patient or surrogate, ordering and performing treatments and interventions, ordering and review of laboratory studies, ordering and review of radiographic studies, pulse oximetry and re-evaluation of patient's condition.  {Patient with significant weakness in her lower extremities in her right arm.  She also has significant hypokalemia.  I spoke with neurology Dr.Khaliqdina and he stated the patient can be admitted to [facility] by the hospitalist and they should get an MRI of the patient's brain.  If the MRI is negative then neurology does not have to be consulted.  If the MRI is positive then hospitalist should consult neurology Click here for ABCD2, HEART and other calculatorsREFRESH Note before signing :1}                          Medical Decision Making Amount and/or Complexity of Data Reviewed Labs: ordered. Radiology: ordered.  Risk Prescription drug management. Decision regarding hospitalization.   Hypoglycemia and significant fatigue and extremity weakness  {Document critical care time when appropriate:1} {Document review of labs and clinical decision tools ie heart score, Chads2Vasc2 etc:1}  {Document your independent review of radiology images, and any outside records:1} {Document your discussion with family members, caretakers, and with consultants:1} {Document social determinants of health  affecting pt's care:1} {Document your decision making why or why not admission, treatments were needed:1} Final Clinical Impression(s) / ED Diagnoses Final diagnoses:  Hypokalemia    Rx / DC Orders ED Discharge Orders     None

## 2022-11-19 ENCOUNTER — Inpatient Hospital Stay (HOSPITAL_COMMUNITY): Payer: Medicare Other

## 2022-11-19 ENCOUNTER — Observation Stay (HOSPITAL_COMMUNITY): Payer: Medicare Other

## 2022-11-19 DIAGNOSIS — I50810 Right heart failure, unspecified: Secondary | ICD-10-CM | POA: Diagnosis not present

## 2022-11-19 DIAGNOSIS — Z515 Encounter for palliative care: Secondary | ICD-10-CM | POA: Diagnosis not present

## 2022-11-19 DIAGNOSIS — I472 Ventricular tachycardia, unspecified: Secondary | ICD-10-CM | POA: Diagnosis not present

## 2022-11-19 DIAGNOSIS — F419 Anxiety disorder, unspecified: Secondary | ICD-10-CM | POA: Diagnosis present

## 2022-11-19 DIAGNOSIS — E785 Hyperlipidemia, unspecified: Secondary | ICD-10-CM | POA: Insufficient documentation

## 2022-11-19 DIAGNOSIS — E876 Hypokalemia: Secondary | ICD-10-CM | POA: Diagnosis present

## 2022-11-19 DIAGNOSIS — Z96651 Presence of right artificial knee joint: Secondary | ICD-10-CM | POA: Diagnosis present

## 2022-11-19 DIAGNOSIS — I5043 Acute on chronic combined systolic (congestive) and diastolic (congestive) heart failure: Secondary | ICD-10-CM | POA: Diagnosis present

## 2022-11-19 DIAGNOSIS — R531 Weakness: Secondary | ICD-10-CM

## 2022-11-19 DIAGNOSIS — K219 Gastro-esophageal reflux disease without esophagitis: Secondary | ICD-10-CM | POA: Diagnosis present

## 2022-11-19 DIAGNOSIS — I110 Hypertensive heart disease with heart failure: Secondary | ICD-10-CM | POA: Diagnosis present

## 2022-11-19 DIAGNOSIS — E782 Mixed hyperlipidemia: Secondary | ICD-10-CM

## 2022-11-19 DIAGNOSIS — Z7189 Other specified counseling: Secondary | ICD-10-CM | POA: Diagnosis not present

## 2022-11-19 DIAGNOSIS — I5021 Acute systolic (congestive) heart failure: Secondary | ICD-10-CM | POA: Diagnosis not present

## 2022-11-19 DIAGNOSIS — F1021 Alcohol dependence, in remission: Secondary | ICD-10-CM | POA: Diagnosis present

## 2022-11-19 DIAGNOSIS — M7989 Other specified soft tissue disorders: Secondary | ICD-10-CM | POA: Diagnosis not present

## 2022-11-19 DIAGNOSIS — E8809 Other disorders of plasma-protein metabolism, not elsewhere classified: Secondary | ICD-10-CM | POA: Diagnosis present

## 2022-11-19 DIAGNOSIS — Z885 Allergy status to narcotic agent status: Secondary | ICD-10-CM | POA: Diagnosis not present

## 2022-11-19 DIAGNOSIS — K580 Irritable bowel syndrome with diarrhea: Secondary | ICD-10-CM

## 2022-11-19 DIAGNOSIS — R0603 Acute respiratory distress: Secondary | ICD-10-CM | POA: Diagnosis not present

## 2022-11-19 DIAGNOSIS — R32 Unspecified urinary incontinence: Secondary | ICD-10-CM | POA: Diagnosis present

## 2022-11-19 DIAGNOSIS — I2510 Atherosclerotic heart disease of native coronary artery without angina pectoris: Secondary | ICD-10-CM | POA: Diagnosis present

## 2022-11-19 DIAGNOSIS — I959 Hypotension, unspecified: Secondary | ICD-10-CM | POA: Diagnosis present

## 2022-11-19 DIAGNOSIS — K625 Hemorrhage of anus and rectum: Secondary | ICD-10-CM | POA: Diagnosis not present

## 2022-11-19 DIAGNOSIS — R000 Tachycardia, unspecified: Secondary | ICD-10-CM | POA: Diagnosis not present

## 2022-11-19 DIAGNOSIS — I3139 Other pericardial effusion (noninflammatory): Secondary | ICD-10-CM | POA: Diagnosis present

## 2022-11-19 DIAGNOSIS — K589 Irritable bowel syndrome without diarrhea: Secondary | ICD-10-CM | POA: Diagnosis present

## 2022-11-19 DIAGNOSIS — I493 Ventricular premature depolarization: Secondary | ICD-10-CM | POA: Diagnosis present

## 2022-11-19 DIAGNOSIS — Z79899 Other long term (current) drug therapy: Secondary | ICD-10-CM | POA: Diagnosis not present

## 2022-11-19 DIAGNOSIS — I5041 Acute combined systolic (congestive) and diastolic (congestive) heart failure: Secondary | ICD-10-CM | POA: Diagnosis not present

## 2022-11-19 DIAGNOSIS — I428 Other cardiomyopathies: Secondary | ICD-10-CM | POA: Diagnosis present

## 2022-11-19 DIAGNOSIS — L03113 Cellulitis of right upper limb: Secondary | ICD-10-CM | POA: Diagnosis present

## 2022-11-19 DIAGNOSIS — I5082 Biventricular heart failure: Secondary | ICD-10-CM | POA: Diagnosis present

## 2022-11-19 DIAGNOSIS — E781 Pure hyperglyceridemia: Secondary | ICD-10-CM | POA: Diagnosis present

## 2022-11-19 DIAGNOSIS — Z85828 Personal history of other malignant neoplasm of skin: Secondary | ICD-10-CM | POA: Diagnosis not present

## 2022-11-19 LAB — COMPREHENSIVE METABOLIC PANEL
ALT: 26 U/L (ref 0–44)
ALT: 27 U/L (ref 0–44)
AST: 61 U/L — ABNORMAL HIGH (ref 15–41)
AST: 48 U/L — ABNORMAL HIGH (ref 15–41)
Albumin: 2.8 g/dL — ABNORMAL LOW (ref 3.5–5.0)
Albumin: 3 g/dL — ABNORMAL LOW (ref 3.5–5.0)
Alkaline Phosphatase: 80 U/L (ref 38–126)
Alkaline Phosphatase: 88 U/L (ref 38–126)
Anion gap: 7 (ref 5–15)
Anion gap: 16 — ABNORMAL HIGH (ref 5–15)
BUN: 9 mg/dL (ref 8–23)
BUN: 9 mg/dL (ref 8–23)
CO2: 27 mmol/L (ref 22–32)
CO2: 24 mmol/L (ref 22–32)
Calcium: 7.1 mg/dL — ABNORMAL LOW (ref 8.9–10.3)
Calcium: 7.9 mg/dL — ABNORMAL LOW (ref 8.9–10.3)
Chloride: 110 mmol/L (ref 98–111)
Chloride: 105 mmol/L (ref 98–111)
Creatinine, Ser: 0.84 mg/dL (ref 0.44–1.00)
Creatinine, Ser: 0.75 mg/dL (ref 0.44–1.00)
GFR, Estimated: >60 mL/min (ref >60)
GFR, Estimated: >60 mL/min (ref >60)
Glucose, Bld: 104 mg/dL — ABNORMAL HIGH (ref 70–99)
Glucose, Bld: 101 mg/dL — ABNORMAL HIGH (ref 70–99)
Potassium: <2 mmol/L — CL (ref 3.5–5.1)
Potassium: 2.1 mmol/L — CL (ref 3.5–5.1)
Sodium: 144 mmol/L (ref 135–145)
Sodium: 145 mmol/L (ref 135–145)
Total Bilirubin: 1.3 mg/dL — ABNORMAL HIGH (ref 0.3–1.2)
Total Bilirubin: 1.1 mg/dL (ref 0.3–1.2)
Total Protein: 5.1 g/dL — ABNORMAL LOW (ref 6.5–8.1)
Total Protein: 5.5 g/dL — ABNORMAL LOW (ref 6.5–8.1)

## 2022-11-19 LAB — CBC WITH DIFFERENTIAL/PLATELET
Abs Immature Granulocytes: 0.02 10*3/uL (ref 0.00–0.07)
Abs Immature Granulocytes: 0 10*3/uL (ref 0.00–0.07)
Basophils Absolute: 0 10*3/uL (ref 0.0–0.1)
Basophils Absolute: 0 10*3/uL (ref 0.0–0.1)
Basophils Relative: 1 %
Basophils Relative: 0 %
Eosinophils Absolute: 0.1 10*3/uL (ref 0.0–0.5)
Eosinophils Absolute: 0 10*3/uL (ref 0.0–0.5)
Eosinophils Relative: 2 %
Eosinophils Relative: 0 %
HCT: 35.4 % — ABNORMAL LOW (ref 36.0–46.0)
HCT: 38 % (ref 36.0–46.0)
Hemoglobin: 12.4 g/dL (ref 12.0–15.0)
Hemoglobin: 12.8 g/dL (ref 12.0–15.0)
Immature Granulocytes: 0 %
Lymphocytes Relative: 30 %
Lymphocytes Relative: 29 %
Lymphs Abs: 1.7 10*3/uL (ref 0.7–4.0)
Lymphs Abs: 1.9 10*3/uL (ref 0.7–4.0)
MCH: 34.6 pg — ABNORMAL HIGH (ref 26.0–34.0)
MCH: 34.3 pg — ABNORMAL HIGH (ref 26.0–34.0)
MCHC: 35 g/dL (ref 30.0–36.0)
MCHC: 33.7 g/dL (ref 30.0–36.0)
MCV: 98.9 fL (ref 80.0–100.0)
MCV: 101.9 fL — ABNORMAL HIGH (ref 80.0–100.0)
Monocytes Absolute: 0.6 10*3/uL (ref 0.1–1.0)
Monocytes Absolute: 0.4 10*3/uL (ref 0.1–1.0)
Monocytes Relative: 10 %
Monocytes Relative: 6 %
Neutro Abs: 3.2 10*3/uL (ref 1.7–7.7)
Neutro Abs: 4.2 10*3/uL (ref 1.7–7.7)
Neutrophils Relative %: 57 %
Neutrophils Relative %: 65 %
Platelets: 142 10*3/uL — ABNORMAL LOW (ref 150–400)
Platelets: 135 10*3/uL — ABNORMAL LOW (ref 150–400)
RBC: 3.58 MIL/uL — ABNORMAL LOW (ref 3.87–5.11)
RBC: 3.73 MIL/uL — ABNORMAL LOW (ref 3.87–5.11)
RDW: 14.4 % (ref 11.5–15.5)
RDW: 14.6 % (ref 11.5–15.5)
WBC: 5.7 10*3/uL (ref 4.0–10.5)
WBC: 6.4 10*3/uL (ref 4.0–10.5)
nRBC: 0 % (ref 0.0–0.2)
nRBC: 0 % (ref 0.0–0.2)
nRBC: 0 /100 WBC

## 2022-11-19 LAB — PHOSPHORUS: Phosphorus: 3.5 mg/dL (ref 2.5–4.6)

## 2022-11-19 LAB — POTASSIUM
Potassium: 2.4 mmol/L — CL (ref 3.5–5.1)
Potassium: 2.7 mmol/L — CL (ref 3.5–5.1)

## 2022-11-19 LAB — MAGNESIUM
Magnesium: 1.2 mg/dL — ABNORMAL LOW (ref 1.7–2.4)
Magnesium: 2.9 mg/dL — ABNORMAL HIGH (ref 1.7–2.4)
Magnesium: 2.9 mg/dL — ABNORMAL HIGH (ref 1.7–2.4)

## 2022-11-19 LAB — TSH: TSH: 6.98 u[IU]/mL — ABNORMAL HIGH (ref 0.350–4.500)

## 2022-11-19 LAB — HIV ANTIBODY (ROUTINE TESTING W REFLEX): HIV Screen 4th Generation wRfx: NONREACTIVE

## 2022-11-19 LAB — T4, FREE: Free T4: 0.71 ng/dL (ref 0.61–1.12)

## 2022-11-19 MED ORDER — MELATONIN 3 MG PO TABS
3.0000 mg | ORAL_TABLET | Freq: Every day | ORAL | Status: DC
  Administered 2022-11-19 – 2022-11-26 (×8): 3 mg via ORAL
  Filled 2022-11-19 (×8): qty 1

## 2022-11-19 MED ORDER — LOPERAMIDE HCL 2 MG PO CAPS
2.0000 mg | ORAL_CAPSULE | Freq: Four times a day (QID) | ORAL | Status: DC | PRN
  Administered 2022-11-19 – 2022-11-23 (×4): 2 mg via ORAL
  Filled 2022-11-19 (×6): qty 1

## 2022-11-19 MED ORDER — SCOPOLAMINE 1 MG/3DAYS TD PT72
1.0000 | MEDICATED_PATCH | TRANSDERMAL | Status: AC
  Administered 2022-11-19: 1.5 mg via TRANSDERMAL
  Filled 2022-11-19: qty 1

## 2022-11-19 MED ORDER — POTASSIUM CHLORIDE CRYS ER 20 MEQ PO TBCR
40.0000 meq | EXTENDED_RELEASE_TABLET | Freq: Once | ORAL | Status: AC
  Administered 2022-11-19: 40 meq via ORAL
  Filled 2022-11-19: qty 2

## 2022-11-19 MED ORDER — CALCIUM GLUCONATE-NACL 1-0.675 GM/50ML-% IV SOLN
1.0000 g | Freq: Once | INTRAVENOUS | Status: AC
  Administered 2022-11-19: 1000 mg via INTRAVENOUS
  Filled 2022-11-19: qty 50

## 2022-11-19 MED ORDER — POTASSIUM CHLORIDE 10 MEQ/100ML IV SOLN
10.0000 meq | Freq: Once | INTRAVENOUS | Status: AC
  Administered 2022-11-19: 10 meq via INTRAVENOUS
  Filled 2022-11-19: qty 100

## 2022-11-19 MED ORDER — LORAZEPAM 2 MG/ML IJ SOLN
0.5000 mg | Freq: Once | INTRAMUSCULAR | Status: AC | PRN
  Administered 2022-11-19: 0.5 mg via INTRAVENOUS
  Filled 2022-11-19: qty 1

## 2022-11-19 MED ORDER — METOPROLOL TARTRATE 25 MG PO TABS
25.0000 mg | ORAL_TABLET | Freq: Two times a day (BID) | ORAL | Status: DC
  Administered 2022-11-19 – 2022-11-20 (×3): 25 mg via ORAL
  Filled 2022-11-19 (×3): qty 1

## 2022-11-19 MED ORDER — GEMFIBROZIL 600 MG PO TABS
600.0000 mg | ORAL_TABLET | Freq: Two times a day (BID) | ORAL | Status: DC
  Administered 2022-11-19 – 2022-11-27 (×16): 600 mg via ORAL
  Filled 2022-11-19 (×20): qty 1

## 2022-11-19 MED ORDER — HEPARIN SODIUM (PORCINE) 5000 UNIT/ML IJ SOLN
5000.0000 [IU] | Freq: Three times a day (TID) | INTRAMUSCULAR | Status: DC
  Administered 2022-11-19 – 2022-11-21 (×7): 5000 [IU] via SUBCUTANEOUS
  Filled 2022-11-19 (×7): qty 1

## 2022-11-19 MED ORDER — POTASSIUM CHLORIDE 10 MEQ/100ML IV SOLN
10.0000 meq | INTRAVENOUS | Status: AC
  Administered 2022-11-19 (×5): 10 meq via INTRAVENOUS
  Filled 2022-11-19 (×5): qty 100

## 2022-11-19 MED ORDER — MAGNESIUM SULFATE 2 GM/50ML IV SOLN
2.0000 g | Freq: Once | INTRAVENOUS | Status: AC
  Administered 2022-11-19: 2 g via INTRAVENOUS
  Filled 2022-11-19: qty 50

## 2022-11-19 MED ORDER — GADOBUTROL 1 MMOL/ML IV SOLN
6.0000 mL | Freq: Once | INTRAVENOUS | Status: AC | PRN
  Administered 2022-11-19: 6 mL via INTRAVENOUS

## 2022-11-19 MED ORDER — POTASSIUM CHLORIDE CRYS ER 20 MEQ PO TBCR
40.0000 meq | EXTENDED_RELEASE_TABLET | ORAL | Status: AC
  Administered 2022-11-19 (×3): 40 meq via ORAL
  Filled 2022-11-19 (×3): qty 2

## 2022-11-19 MED ORDER — SODIUM CHLORIDE 0.9 % IV SOLN: 12.5000 mg | Freq: Four times a day (QID) | INTRAVENOUS | Status: DC | PRN

## 2022-11-19 NOTE — Assessment & Plan Note (Signed)
-  Continue home gemfibrozil 

## 2022-11-19 NOTE — Assessment & Plan Note (Addendum)
-   Potassium is undetectable less than 2 - Replaced with 40 mill equivalents in normal saline overnight, 40 mEq p.o., 10 mEq IV x 2 in the ED - Recheck in the a.m. with magnesium - Likely due to to GI losses in the setting of diarrhea predominant IBS - Continue to monitor

## 2022-11-19 NOTE — Assessment & Plan Note (Signed)
-   Likely the etiology behind hypokalemia - Will add Imodium if needed - No diarrhea since being in the ER

## 2022-11-19 NOTE — Progress Notes (Addendum)
Date and time results received: 11/19/22 2030   Test: Potassium Critical Value: 2.7  Name of Provider Notified: Monica Becton MD  Orders Received? Or Actions Taken?:  Oral potassium due this PM given. No new orders .

## 2022-11-19 NOTE — Assessment & Plan Note (Signed)
-   Continue home metoprolol 

## 2022-11-19 NOTE — Progress Notes (Signed)
PROGRESS NOTE    Kathleen Velazquez  LOV:[id number] DOB: [birth year] DOA: 11/18/2022 PCP: Toma Deiters, MD     Brief Narrative:   67 y.o. WF PMHx Anxiety, GERD, HTN, sinus tachycardia, irritable bowel syndrome,  Presents the ED with a chief complaint of right sided weakness.  Patient reports that she had right-sided weakness that started during the night between the 11th and 12th.  At 3 PM on the 12th she noted she could not move her right arm at all.  She could not lift it up and left she grabbed it with the left hand to lift it up.  She reports she was having difficulty with ambulation with weakness in her right leg, but the right leg was not dragging.  She had no numbness in her right leg or arm.  Patient reports no choking, slurred speech, drooling, facial droop.  She had no history of stroke to her knowledge.  She reports no associated headache, change in vision, change in hearing.  Patient has very low potassium at presentation.  She reports she has diarrhea all the time.  Every time she eats, and then sometimes even when she is not trying to eat she has diarrhea.  She reports this is due to IBS.  She had low potassium in the past.  She is not on any potassium supplementation at home.  Patient reports she has had some abdominal pain that is been consistent with her normal IBS pain.  She has not had any urinary frequency.  Patient has no other complaints at this time.   Patient does not smoke, does not drink, does not use illicit drugs.  She is vaccinated for COVID and flu.  Patient is full code.   Subjective: 4/14 A/O x 4, sitting on the edge of the bed states feels improved but not back to baseline.  States has underlying anxiety but does not want any medication for it.  Was able to ambulate within the room a small amount with PT.  Anxious about having to possibly go home on oxygen (believe this is most likely her anxiety).   Assessment & Plan: Covid vaccination;   Principal Problem:    Right sided weakness Active Problems:   Sinus tachycardia   Hypokalemia   IBS (irritable bowel syndrome)   Hyperlipidemia  Acute systolic and diastolic CHF/pericardial effusion - Severe see results below - Per Dr. Laurance Flatten cardiology patient refuses any other studies/interventions.  Right sided weakness - Right upper and lower extremity weakness - Last known well was the night of the 11th - CT head shows no acute intracranial findings - Neurology consulted and thinks this is likely due to the potassium but did recommend MRI - MRI ordered for the a.m. - PT and OT eval and treat - Every 4 hours neurochecks - Continue to monitor -4/14 PT/OT evaluation in a.m. for CIR vs SNF   Hyperlipidemia - Continue home gemfibrozil   IBS (irritable bowel syndrome) - Likely the etiology behind hypokalemia - Will add Imodium if needed - No diarrhea since being in the ER   Hypokalemia -Potassium goal.4 - Potassium is undetectable less than 2 - Replaced with 40 mill equivalents in normal saline overnight, 40 mEq p.o., 10 mEq IV x 2 in the ED - Likely due to to GI losses in the setting of diarrhea predominant IBS -4/14 K-Dur  Hypocalcemia - Calcium goal> 8.9 - 4/14 corrected calcium= 8.5 - 4/14 Calcium Gluconate IV 2 g   Sinus tachycardia -  Continue home metoprolol  Goals of care - 4/14 palliative care consult: Patient with SEVERE acute systolic and diastolic CHF, refuses any interventions evaluate for change of CODE STATUS to DNR, evaluate for palliative care/hospice       Mobility Assessment (last 72 hours)     Mobility Assessment     Row Name 11/19/22 0301           Does patient have an order for bedrest or is patient medically unstable No - Continue assessment       What is the highest level of mobility based on the progressive mobility assessment? Level 2 (Chairfast) - Balance while sitting on edge of bed and cannot stand       Is the above level different  from baseline mobility prior to current illness? Yes - Recommend PT order                      DVT prophylaxis: Subcu heparin Code Status: Full Family Communication:  Status is: Inpatient    Dispo: The patient is from: Home              Anticipated d/c is to: Home              Anticipated d/c date is: > 3 days              Patient currently is not medically stable to d/c.      Consultants:  Cardiology Dr. Laurance Flatten   Procedures/Significant Events:  4/12 CT head W0 contrast -No acute intracranial findings are seen.  4/13 MRI brain W/W0 contrast - Negative for an acute infarct. No hemorrhage. No hydrocephalus 4/14 echocardiogram Left Ventricle: LVEF= 25 to 30%. The left ventricle has severely decreased function. -Grade II diastolic dysfunction (pseudonormalization).  -Right Ventricle:  Right ventricular systolic function is severely reduced. estimated right ventricular systolic pressure is 31.8 mmHg.  -Pericardium: A moderately sized pericardial effusion is present. There is  no evidence of cardiac tamponade.   Mitral Valve: Mild to moderate mitral valve regurgitation.   Tricuspid Valve: The tricuspid valve is normal in structure. Tricuspid  valve regurgitation is mild.   Aortic Valve: The aortic valve is tricuspid. There is mild calcification  of the aortic valve. There is mild thickening of the aortic valve. Aortic  valve regurgitation is not visualized. Aortic valve  sclerosis/calcification is present, without any  evidence of aortic stenosis. Aortic valve peak gradient measures 4.2 mmHg.        I have personally reviewed and interpreted all radiology studies and my findings are as above.  VENTILATOR SETTINGS:    Cultures   Antimicrobials:    Devices    LINES / TUBES:      Continuous Infusions:  0.9 % NaCl with KCl 40 mEq / L Stopped (11/19/22 0518)   potassium chloride 10 mEq (11/19/22 0728)     Objective: Vitals:    11/19/22 0130 11/19/22 0245 11/19/22 0433 11/19/22 0734  BP: 114/88 121/88 103/79 (!) 128/98  Pulse: 73 95 97 60  Resp: (!) 23 18 18 20   Temp: 98.7 F (37.1 C) 98.2 F (36.8 C) 98.3 F (36.8 C) 97.9 F (36.6 C)  TempSrc: Oral Oral Oral Oral  SpO2: 96% 95% 92% 90%  Weight:  60 kg    Height:        Intake/Output Summary (Last 24 hours) at 11/19/2022 0902 Last data filed at 11/19/2022 0650 Gross per 24 hour  Intake 672.5 ml  Output 1 ml  Net 671.5 ml   Filed Weights   11/18/22 1911 11/19/22 0245  Weight: 63.5 kg 60 kg    Examination:  General: A/O x 4, No acute respiratory distress Eyes: negative scleral hemorrhage, negative anisocoria, negative icterus ENT: Negative Runny nose, negative gingival bleeding, Neck:  Negative scars, masses, torticollis, lymphadenopathy, JVD Lungs: Clear to auscultation bilaterally without wheezes or crackles Cardiovascular: Regular rate and rhythm without murmur gallop or rub normal S1 and S2 Abdomen: negative abdominal pain, nondistended, positive soft, bowel sounds, no rebound, no ascites, no appreciable mass Extremities: No significant cyanosis, clubbing, or edema bilateral lower extremities Skin: Negative rashes, lesions, ulcers Psychiatric:  Negative depression, positive anxiety, negative fatigue, negative mania  Central nervous system:  Cranial nerves II through XII intact, tongue/uvula midline, all extremities muscle strength 5/5, sensation intact throughout, negative dysarthria, negative expressive aphasia, negative receptive aphasia.  .     Data Reviewed: Care during the described time interval was provided by me .  I have reviewed this patient's available data, including medical history, events of note, physical examination, and all test results as part of my evaluation.  CBC: Recent Labs  Lab 11/18/22 1927 11/19/22 0347  WBC 6.9 5.7  NEUTROABS  --  3.2  HGB 14.5 12.4  HCT 41.6 35.4*  MCV 98.8 98.9  PLT 156 142*   Basic  Metabolic Panel: Recent Labs  Lab 11/18/22 1927 11/19/22 0345  NA 143 144  K <2.0* <2.0*  CL 99 110  CO2 29 27  GLUCOSE 101* 104*  BUN 11 9  CREATININE 0.75 0.84  CALCIUM 8.3* 7.1*  MG  --  1.2*   GFR: Estimated Creatinine Clearance: 50 mL/min (by C-G formula based on SCr of 0.84 mg/dL). Liver Function Tests: Recent Labs  Lab 11/18/22 1927 11/19/22 0345  AST 41 61*  ALT 22 26  ALKPHOS 94 80  BILITOT 1.4* 1.3*  PROT 6.9 5.1*  ALBUMIN 3.7 2.8*   No results for input(s): "LIPASE", "AMYLASE" in the last 168 hours. No results for input(s): "AMMONIA" in the last 168 hours. Coagulation Profile: No results for input(s): "INR", "PROTIME" in the last 168 hours. Cardiac Enzymes: No results for input(s): "CKTOTAL", "CKMB", "CKMBINDEX", "TROPONINI" in the last 168 hours. BNP (last 3 results) No results for input(s): "PROBNP" in the last 8760 hours. HbA1C: No results for input(s): "HGBA1C" in the last 72 hours. CBG: Recent Labs  Lab 11/18/22 1942  GLUCAP 122*   Lipid Profile: No results for input(s): "CHOL", "HDL", "LDLCALC", "TRIG", "CHOLHDL", "LDLDIRECT" in the last 72 hours. Thyroid Function Tests: Recent Labs    11/19/22 0345  TSH 6.980*   Anemia Panel: No results for input(s): "VITAMINB12", "FOLATE", "FERRITIN", "TIBC", "IRON", "RETICCTPCT" in the last 72 hours. Sepsis Labs: No results for input(s): "PROCALCITON", "LATICACIDVEN" in the last 168 hours.  No results found for this or any previous visit (from the past 240 hour(s)).       Radiology Studies: MR BRAIN W WO CONTRAST  Result Date: 11/19/2022 CLINICAL DATA:  Stroke suspected EXAM: MRI HEAD WITHOUT AND WITH CONTRAST TECHNIQUE: Multiplanar, multiecho pulse sequences of the brain and surrounding structures were obtained without and with intravenous contrast. CONTRAST:  6mL GADAVIST GADOBUTROL 1 MMOL/ML IV SOLN COMPARISON:  CT head 11/18/2022 FINDINGS: Brain: Negative for an acute infarct. No hemorrhage.  No hydrocephalus. No extra-axial fluid collection. Mild for age chronic microvascular ischemic change. Age-appropriate volume loss. No mass effect. No mass lesion. Vascular: Normal flow voids. Skull and  upper cervical spine: Normal marrow signal. Sinuses/Orbits: Trace bilateral mastoid effusions. No middle ear effusion. Paranasal sinuses are notable for mucosal thickening in the bilateral sphenoid sinuses. Bilateral lens replacement. Orbits are otherwise unremarkable. Other: None. IMPRESSION: No acute intracranial process. Electronically Signed   By: Lorenza Cambridge M.D.   On: 11/19/2022 07:02   CT Head Wo Contrast  Result Date: 11/18/2022 CLINICAL DATA:  CVA, right arm weakness EXAM: CT HEAD WITHOUT CONTRAST TECHNIQUE: Contiguous axial images were obtained from the base of the skull through the vertex without intravenous contrast. RADIATION DOSE REDUCTION: This exam was performed according to the departmental dose-optimization program which includes automated exposure control, adjustment of the mA and/or kV according to patient size and/or use of iterative reconstruction technique. COMPARISON:  07/27/2016 FINDINGS: Brain: No acute intracranial findings are seen. There are no signs of bleeding within the cranium. Cortical sulci are prominent. Vascular: Scattered arterial calcifications are seen. Skull: No acute findings are seen. Sinuses/Orbits: There is mucosal thickening in sphenoid sinus. There are no air-fluid levels in the sinuses. Other: None IMPRESSION: No acute intracranial findings are seen in noncontrast CT brain. Atrophy. Chronic sphenoid sinusitis. Electronically Signed   By: Ernie Avena M.D.   On: 11/18/2022 20:55        Scheduled Meds:  gemfibrozil  600 mg Oral BID AC   heparin  5,000 Units Subcutaneous Q8H   metoprolol tartrate  25 mg Oral BID   Continuous Infusions:  0.9 % NaCl with KCl 40 mEq / L Stopped (11/19/22 0518)   potassium chloride 10 mEq (11/19/22 0728)     LOS: 0  days    Time spent:40 min    Neyla Gauntt, Roselind Messier, MD [facility]   If 7PM-7AM, please contact night-coverage 11/19/2022, 9:02 AM

## 2022-11-19 NOTE — Progress Notes (Addendum)
Date and time results received: 11/19/22 1108  Test: potassium Critical Value: 2.1  Name of Provider Notified: NO  Orders Received? Or Actions Taken?:  patient receiving 6 runs IV potassium

## 2022-11-19 NOTE — Progress Notes (Addendum)
Date and time results received: 11/19/22 1747 (use smartphrase ".now" to insert current time)  Test: potassium Critical Value: 2.4  Name of Provider Notified: Dr Renford Dills reached out to RN via secure chat  Orders Received? Or Actions Taken?: NEW ORDERS RECEIVED

## 2022-11-19 NOTE — Progress Notes (Signed)
OT Cancellation Note  Patient Details Name: Kathleen Velazquez MRN: [id number] DOB: 09-19-1955   Cancelled Treatment:    Reason Eval/Treat Not Completed: Patient not medically ready (MD requesting that we hold therapy evals until tomorrow due to pt's low potassium.)  Limmie Patricia, OTR/L,CBIS  Supplemental OT - [facility] and [facility] Secure Chat Preferred   11/19/2022, 12:38 PM

## 2022-11-19 NOTE — Progress Notes (Signed)
Brief same day note:  Patient is a 67 year old female with history of anxiety, GERD, hypertension, sinus tachycardia, IBS with chronic diarrhea who presented to the emergency department with complaint of right-sided weakness, difficulty in ambulation.  Stroke was suspected on admission.  CT head did not show any acute findings.  Patient was transferred to [facility] for further workup.  MRI did not show any acute intracranial findings.  Hospital course remarkable for severe hypokalemia.  Assessment and plan:   Right-sided weakness: Presented with complaint of right sided upper and lower extremity weakness since 4/11.  CT head did not show any acute findings as well as MRI. This was discussed with neurology and was thought to be secondary to severe hypokalemia. PT/OT consulted.  We will hold the therapy assessment till tomorrow until potassium level is better.  Severe hypokalemia: Magnesium level optimal.  Patient has chronic diarrhea which could have contributed. Continue aggressive supplementation and  monitoring.  If persistently hypokalemic, will request nephrology consultation  Chronic diarrhea: Has IBS.  Had colonoscopy years ago.  We recommend follow-up with gastroenterology as an outpatient.  Continue Ativan as needed.  Denies any abdomen, nausea or vomiting.  Abdomen is soft, nondistended, nontender. No leukocytosis or fever.  Low suspicion for infection etiology for diarrhea.  Prolonged QT: QTc of 654, critical value.  Will monitor daily EKG.  This is most likely secondary to severe hypokalemia.  Close monitoring  Hyperlipidemia: On gemfrozil  Sinus tachycardia: Chronic.  Continue home metoprolol  Mild respiratory insufficiency: Currently on 2L  of oxygen.  Some crackles heard on the left lower lung.  Will check chest x-ray.  Denies shortness of breath or cough

## 2022-11-19 NOTE — Progress Notes (Signed)
PT Cancellation Note  Patient Details Name: Kathleen Velazquez MRN: [id number] DOB: 31-May-1956   Cancelled Treatment:    Reason Eval/Treat Not Completed: Patient not medically ready (MD messaged and asked to hold due to very low potassium. Will continue to follow up as able and appropriate.)  Harrel Carina, DPT, CLT  Acute Rehabilitation Services Office: [phone] (Secure chat preferred)   Claudia Desanctis 11/19/2022, 12:38 PM

## 2022-11-19 NOTE — Progress Notes (Addendum)
Dr. Renford Dills on department, notified him of patient Respiratory rate of 27.  No new orders received.

## 2022-11-19 NOTE — Assessment & Plan Note (Signed)
-   Right upper and lower extremity weakness - Last known well was the night of the 11th - CT head shows no acute intracranial findings - Neurology consulted and thinks this is likely due to the potassium but did recommend MRI - MRI ordered for the a.m. - PT and OT eval and treat - Every 4 hours neurochecks - Continue to monitor

## 2022-11-19 NOTE — H&P (Signed)
History and Physical    Patient: Kathleen Velazquez:[id number] DOB: [birth year] DOA: 11/18/2022 DOS: the patient was seen and examined on 11/19/2022 PCP: Toma Deiters, MD  Patient coming from: Home  Chief Complaint:  Chief Complaint  Patient presents with   Weakness   HPI: Kathleen Velazquez is a 68 y.o. female with medical history significant of anxiety, GERD, hypertension, irritable bowel syndrome, sinus tachycardia, and more presents the ED with a chief complaint of right sided weakness.  Patient reports that she had right-sided weakness that started during the night between the 11th and 12th.  At 3 PM on the 12th she noted she could not move her right arm at all.  She could not lift it up and left she grabbed it with the left hand to lift it up.  She reports she was having difficulty with ambulation with weakness in her right leg, but the right leg was not dragging.  She had no numbness in her right leg or arm.  Patient reports no choking, slurred speech, drooling, facial droop.  She had no history of stroke to her knowledge.  She reports no associated headache, change in vision, change in hearing.  Patient has very low potassium at presentation.  She reports she has diarrhea all the time.  Every time she eats, and then sometimes even when she is not trying to eat she has diarrhea.  She reports this is due to IBS.  She had low potassium in the past.  She is not on any potassium supplementation at home.  Patient reports she has had some abdominal pain that is been consistent with her normal IBS pain.  She has not had any urinary frequency.  Patient has no other complaints at this time.  Patient does not smoke, does not drink, does not use illicit drugs.  She is vaccinated for COVID and flu.  Patient is full code. Review of Systems: As mentioned in the history of present illness. All other systems reviewed and are negative. Past Medical History:  Diagnosis Date   Anxiety    Arthritis    knees,     Ejection fraction    Normal, echo, June, 2013   GERD (gastroesophageal reflux disease)    Headache(784.0)    hx of migraines    History of hiatal hernia    Hypertension    Irritable bowel syndrome (IBS)    PONV (postoperative nausea and vomiting)    Sinus tachycardia    Persistent sinus tachycardia, June, 2013 ( prior monitor in 2007 had shown no significant abnormalities.)   Past Surgical History:  Procedure Laterality Date   CHOLECYSTECTOMY     ESOPHAGOGASTRODUODENOSCOPY N/A 08/19/2016   Procedure: ESOPHAGOGASTRODUODENOSCOPY (EGD);  Surgeon: Malissa Hippo, MD;  Location: [facility];  Service: Endoscopy;  Laterality: N/A;  2:50   EYE SURGERY     cataract   HEMORRHOID SURGERY N/A 05/20/2013   Procedure: EXTENSIVE HEMORRHOIDECTOMY;  Surgeon: Dalia Heading, MD;  Location: [facility];  Service: General;  Laterality: N/A;   JOINT REPLACEMENT     right knee replacement    OTHER SURGICAL HISTORY     left breast biopsy - benign    OTHER SURGICAL HISTORY     hx of skin cancer surgery    TOTAL KNEE ARTHROPLASTY  11/28/2011   Procedure: TOTAL KNEE ARTHROPLASTY;  Surgeon: Loanne Drilling, MD;  Location: [facility];  Service: Orthopedics;  Laterality: Left;   TOTAL KNEE REVISION Right 02/26/2020  Procedure: TOTAL KNEE REVISION;  Surgeon: Ollen Gross, MD;  Location: [facility];  Service: Orthopedics;  Laterality: Right;    TUBAL LIGATION     Social History:  reports that she has never smoked. She has never used smokeless tobacco. She reports that she does not currently use alcohol. She reports that she does not use drugs.  Allergies  Allergen Reactions   Codeine Nausea And Vomiting    Family History  Problem Relation Age of Onset   Irritable bowel syndrome Sister     Prior to Admission medications   Medication Sig Start Date End Date Taking? Authorizing Provider  allopurinol (ZYLOPRIM) 100 MG tablet Take 100 mg by mouth daily. 06/22/22  Yes [provider]   gemfibrozil (LOPID) 600 MG tablet Take 600 mg by mouth 2 (two) times daily before a meal.   Yes [provider]  ibuprofen (ADVIL) 200 MG tablet Take 800 mg by mouth every 6 (six) hours as needed for headache or moderate pain.   Yes [provider]  metoprolol tartrate (LOPRESSOR) 25 MG tablet Take 25 mg by mouth 2 (two) times daily.   Yes [provider]    Physical Exam: Vitals:   11/19/22 0000 11/19/22 0030 11/19/22 0100 11/19/22 0130  BP: 126/83 99/70 105/80 114/88  Pulse: 85 (!) 107 (!) 106 73  Resp: 16 (!) 30 20 (!) 23  Temp:    98.7 F (37.1 C)  TempSrc:    Oral  SpO2: 90% 92% 95% 96%  Weight:      Height:       1.  General: Patient lying supine in bed,  no acute distress   2. Psychiatric: Alert and oriented x 3, mood and behavior normal for situation, pleasant and cooperative with exam   3. Neurologic: Speech and language are normal, face is symmetric, no facial droop with smile, grip strength is subtly weaker on the right than the left, patient cannot hold her right arm up against gravity, right lower extremity has subtle weakness compared to left with dorsiflexion and plantarflexion.  Patient has difficulty holding leg up against gravity.  Sensation is intact.    4. HEENMT:  Head is atraumatic, normocephalic, pupils reactive to light, neck is supple, trachea is midline, mucous membranes are moist   5. Respiratory : Lungs are clear to auscultation bilaterally without wheezing, rhonchi, rales, no cyanosis, no increase in work of breathing or accessory muscle use   6. Cardiovascular : Heart rate slightly tachycardic, rhythm is regular, no murmurs, rubs or gallops, no peripheral edema, peripheral pulses palpated   7. Gastrointestinal:  Abdomen is soft, nondistended, nontender to palpation bowel sounds active, no masses or organomegaly palpated   8. Skin:  Skin is warm, dry and intact without rashes, acute lesions, or ulcers on limited exam    9.Musculoskeletal:  No acute deformities or trauma, no asymmetry in tone, no peripheral edema, peripheral pulses palpated, no tenderness to palpation in the extremities  Data Reviewed: In the ED Temp 98.8, heart rate 87-90, respiratory rate 16-21, blood pressure 117/64-129/93, satting 94% No leukocytosis with white blood cell count of 6.9, hemoglobin 14.5, platelets 156 Chemistry feels a hypokalemia at less than 2.0, T. bili 1.4 CT head shows no acute intracranial findings EKG shows a heart rate 149 initially with sinus tachycardia and a QTc of 426 Heart rate is improved to 1 teens at the time of my exam Assessment and Plan: * Right sided weakness - Right upper and lower  extremity weakness - Last known well was the night of the 11th - CT head shows no acute intracranial findings - Neurology consulted and thinks this is likely due to the potassium but did recommend MRI - MRI ordered for the a.m. - PT and OT eval and treat - Every 4 hours neurochecks - Continue to monitor  Hyperlipidemia - Continue home gemfibrozil  IBS (irritable bowel syndrome) - Likely the etiology behind hypokalemia - Will add Imodium if needed - No diarrhea since being in the ER  Hypokalemia - Potassium is undetectable less than 2 - Replaced with 40 mill equivalents in normal saline overnight, 40 mEq p.o., 10 mEq IV x 2 in the ED - Recheck in the a.m. with magnesium - Likely due to to GI losses in the setting of diarrhea predominant IBS - Continue to monitor  Sinus tachycardia - Continue home metoprolol      Advance Care Planning:   Code Status: Full Code  Consults: Neurology reports that if they are needed after potassium is normalized, reconsult  Family Communication: No family at bedside  Severity of Illness: The appropriate patient status for this patient is OBSERVATION. Observation status is judged to be reasonable and necessary in order to provide the required intensity of service to  ensure the patient's safety. The patient's presenting symptoms, physical exam findings, and initial radiographic and laboratory data in the context of their medical condition is felt to place them at decreased risk for further clinical deterioration. Furthermore, it is anticipated that the patient will be medically stable for discharge from the hospital within 2 midnights of admission.   Author: Lilyan Gilford, DO 11/19/2022 2:18 AM  For on call review [email or web address].

## 2022-11-19 NOTE — Progress Notes (Addendum)
Overnight progress note  Patient admitted yesterday for right-sided weakness in the setting of severe hypokalemia with potassium <2 on admission and was given potassium replacement.  Notified by RN this morning that patient's heart rate is mostly in 100-120s and intermittently going up to 170s but not sustaining.  Patient is asymptomatic and not hypotensive.  She is on scheduled oral metoprolol 25 mg twice daily. EKG repeated and showing worsening QT prolongation (QTc 654).  IV magnesium 2 g ordered.  Stat labs ordered to check magnesium level, repeat potassium level, and TSH.  Continue cardiac monitoring and avoid QT prolonging drugs.  I have discontinued Phenergan.  Addendum/update: Labs showing potassium <2.0, magnesium 1.2, calcium 8.1 when corrected for hypoalbuminemia.  TSH is elevated and not consistent with hyperthyroidism, will add on free T4 level to labs.  Continue electrolyte repletion, IV potassium 10 mEq x 6 and oral potassium 40 mEq ordered.  Additional IV mag 2 g ordered and IV calcium gluconate 1 g ordered.  Continue to monitor labs closely and replace electrolytes as needed.

## 2022-11-19 NOTE — Progress Notes (Signed)
   11/19/22 0734  Assess: MEWS Score  Temp 97.9 F (36.6 C)  BP (!) 128/98  MAP (mmHg) 107  Pulse Rate 60  ECG Heart Rate (!) 124  Resp 20  SpO2 90 %  O2 Device Nasal Cannula  Assess: MEWS Score  MEWS Temp 0  MEWS Systolic 0  MEWS Pulse 2  MEWS RR 0  MEWS LOC 0  MEWS Score 2  MEWS Score Color Yellow  Assess: if the MEWS score is Yellow or Red  Were vital signs taken at a resting state? Yes  Focused Assessment No change from prior assessment  Does the patient meet 2 or more of the SIRS criteria? No  Does the patient have a confirmed or suspected source of infection? No  Provider and Rapid Response Notified? Yes  MEWS guidelines implemented  Yes, yellow  Treat  MEWS Interventions Considered administering scheduled or prn medications/treatments as ordered  Take Vital Signs  Increase Vital Sign Frequency  Yellow: Q2hr x1, continue Q4hrs until patient remains green for 12hrs  Escalate  MEWS: Escalate Yellow: Discuss with charge nurse and consider notifying provider and/or RRT  Provider Notification  Provider Name/Title Dr Renford Dills  Date Provider Notified 11/19/22  Time Provider Notified 1100  Method of Notification Face-to-face  Notification Reason Other (Comment) (change in mews)  Provider response In department  Date of Provider Response 11/19/22  Time of Provider Response 1100 (stated he would see the patient)  Assess: SIRS CRITERIA  SIRS Temperature  0  SIRS Pulse 1  SIRS Respirations  0  SIRS WBC 0  SIRS Score Sum  1

## 2022-11-20 ENCOUNTER — Inpatient Hospital Stay (HOSPITAL_COMMUNITY): Payer: Medicare Other

## 2022-11-20 DIAGNOSIS — R000 Tachycardia, unspecified: Secondary | ICD-10-CM | POA: Diagnosis not present

## 2022-11-20 DIAGNOSIS — I5021 Acute systolic (congestive) heart failure: Secondary | ICD-10-CM

## 2022-11-20 DIAGNOSIS — R0603 Acute respiratory distress: Secondary | ICD-10-CM | POA: Diagnosis not present

## 2022-11-20 DIAGNOSIS — E876 Hypokalemia: Secondary | ICD-10-CM | POA: Diagnosis not present

## 2022-11-20 DIAGNOSIS — I50810 Right heart failure, unspecified: Secondary | ICD-10-CM | POA: Diagnosis not present

## 2022-11-20 LAB — CBC WITH DIFFERENTIAL/PLATELET
Abs Immature Granulocytes: 0.04 10*3/uL (ref 0.00–0.07)
Basophils Absolute: 0 10*3/uL (ref 0.0–0.1)
Basophils Relative: 0 %
Eosinophils Absolute: 0 10*3/uL (ref 0.0–0.5)
Eosinophils Relative: 0 %
HCT: 39.6 % (ref 36.0–46.0)
Hemoglobin: 13.3 g/dL (ref 12.0–15.0)
Immature Granulocytes: 0 %
Lymphocytes Relative: 19 %
Lymphs Abs: 1.9 10*3/uL (ref 0.7–4.0)
MCH: 35.3 pg — ABNORMAL HIGH (ref 26.0–34.0)
MCHC: 33.6 g/dL (ref 30.0–36.0)
MCV: 105 fL — ABNORMAL HIGH (ref 80.0–100.0)
Monocytes Absolute: 0.8 10*3/uL (ref 0.1–1.0)
Monocytes Relative: 8 %
Neutro Abs: 7.1 10*3/uL (ref 1.7–7.7)
Neutrophils Relative %: 73 %
Platelets: 150 10*3/uL (ref 150–400)
RBC: 3.77 MIL/uL — ABNORMAL LOW (ref 3.87–5.11)
RDW: 14.7 % (ref 11.5–15.5)
WBC: 9.9 10*3/uL (ref 4.0–10.5)
nRBC: 0 % (ref 0.0–0.2)

## 2022-11-20 LAB — MAGNESIUM: Magnesium: 2.8 mg/dL — ABNORMAL HIGH (ref 1.7–2.4)

## 2022-11-20 LAB — POTASSIUM: Potassium: 4.2 mmol/L (ref 3.5–5.1)

## 2022-11-20 LAB — D-DIMER, QUANTITATIVE: D-Dimer, Quant: 2.94 ug/mL-FEU — ABNORMAL HIGH (ref 0.00–0.50)

## 2022-11-20 LAB — PHOSPHORUS: Phosphorus: 4.3 mg/dL (ref 2.5–4.6)

## 2022-11-20 LAB — COMPREHENSIVE METABOLIC PANEL
ALT: 42 U/L (ref 0–44)
AST: 75 U/L — ABNORMAL HIGH (ref 15–41)
Albumin: 2.9 g/dL — ABNORMAL LOW (ref 3.5–5.0)
Alkaline Phosphatase: 95 U/L (ref 38–126)
Anion gap: 12 (ref 5–15)
BUN: 11 mg/dL (ref 8–23)
CO2: 20 mmol/L — ABNORMAL LOW (ref 22–32)
Calcium: 7.6 mg/dL — ABNORMAL LOW (ref 8.9–10.3)
Chloride: 108 mmol/L (ref 98–111)
Creatinine, Ser: 1.32 mg/dL — ABNORMAL HIGH (ref 0.44–1.00)
GFR, Estimated: 44 mL/min — ABNORMAL LOW (ref >60)
Glucose, Bld: 162 mg/dL — ABNORMAL HIGH (ref 70–99)
Potassium: 3.9 mmol/L (ref 3.5–5.1)
Sodium: 140 mmol/L (ref 135–145)
Total Bilirubin: 1.9 mg/dL — ABNORMAL HIGH (ref 0.3–1.2)
Total Protein: 5.6 g/dL — ABNORMAL LOW (ref 6.5–8.1)

## 2022-11-20 LAB — BRAIN NATRIURETIC PEPTIDE: B Natriuretic Peptide: 1370.1 pg/mL — ABNORMAL HIGH (ref 0.0–100.0)

## 2022-11-20 LAB — ECHOCARDIOGRAM COMPLETE
AR max vel: 1.94 cm2
AV Peak grad: 4.2 mmHg
Ao pk vel: 1.02 m/s
Area-P 1/2: 6.96 cm2
Height: 62 in
S' Lateral: 3 cm
Weight: 2116.42 oz

## 2022-11-20 MED ORDER — POTASSIUM CHLORIDE CRYS ER 20 MEQ PO TBCR
20.0000 meq | EXTENDED_RELEASE_TABLET | Freq: Once | ORAL | Status: AC
  Administered 2022-11-20: 20 meq via ORAL
  Filled 2022-11-20: qty 1

## 2022-11-20 MED ORDER — SPIRONOLACTONE 12.5 MG HALF TABLET
12.5000 mg | ORAL_TABLET | Freq: Every day | ORAL | Status: DC
  Administered 2022-11-20 – 2022-11-27 (×8): 12.5 mg via ORAL
  Filled 2022-11-20 (×8): qty 1

## 2022-11-20 MED ORDER — FUROSEMIDE 10 MG/ML IJ SOLN
20.0000 mg | Freq: Once | INTRAMUSCULAR | Status: AC
  Administered 2022-11-20: 20 mg via INTRAVENOUS
  Filled 2022-11-20: qty 2

## 2022-11-20 MED ORDER — CALCIUM GLUCONATE-NACL 2-0.675 GM/100ML-% IV SOLN
2.0000 g | Freq: Once | INTRAVENOUS | Status: AC
  Administered 2022-11-20: 2000 mg via INTRAVENOUS
  Filled 2022-11-20: qty 100

## 2022-11-20 NOTE — Evaluation (Signed)
Occupational Therapy Evaluation Patient Details Name: Kathleen Velazquez MRN: [id number] DOB: 1955/10/24 Today's Date: 11/20/2022   History of Present Illness Pt is a 67 yo female presenting to ED with reports of R sided weakness. Imaging without acute findings, hospital course complicated by low potassium. PMH: anxiety, GERD, HTN, IBS, sinus tachycardia, arthritis   Clinical Impression   Pt currently with functional limitations due to the deficits listed below (see OT Problem List). Prior to admit, pt lived alone and was Modified independent with all ADL tasks and functional mobility. Pt demonstrated SpO2 within normal range on RA. Demonstrates increased anxiety during functional mobility causing her SOB and difficulty catching her breathe. Did provide some verbal education on energy conservation techniques, breathing techniques, and grounding techniques although will require additional education and practice of techniques. Recommend HHOT at discharge to further work on energy conservation and anxiety management techniques within pt's home. Pt will benefit from acute skilled OT to increase their safety and independence with ADL and functional mobility for ADL to facilitate discharge. OT will continue to follow patient acutely.        Recommendations for follow up therapy are one component of a multi-disciplinary discharge planning process, led by the attending physician.  Recommendations may be updated based on patient status, additional functional criteria and insurance authorization.   Assistance Recommended at Discharge Intermittent Supervision/Assistance  Patient can return home with the following A lot of help with walking and/or transfers;A little help with bathing/dressing/bathroom;Assistance with cooking/housework;Assist for transportation;Help with stairs or ramp for entrance    Functional Status Assessment  Patient has had a recent decline in their functional status and demonstrates the  ability to make significant improvements in function in a reasonable and predictable amount of time.  Equipment Recommendations  Other (comment) (BSC?)       Precautions / Restrictions Precautions Precautions: Fall (anxiety) Restrictions Weight Bearing Restrictions: No      Mobility Bed Mobility Overal bed mobility:  (sitting on EOB upon therapy arrival)      Transfers Overall transfer level: Needs assistance Equipment used: Rolling walker (2 wheels) Transfers: Sit to/from Stand, Bed to chair/wheelchair/BSC Sit to Stand: Min assist, +2 physical assistance, From elevated surface     Step pivot transfers: Min assist, +2 physical assistance     General transfer comment: During first transfer, pt became anxious and attempted to sit very quickly. Second transfer pt did not become as anxious as before. Did leave her RW while performing a 360 degree turn to sit requiring VC for safety.      Balance Overall balance assessment: Needs assistance Sitting-balance support: Bilateral upper extremity supported, Feet supported Sitting balance-Leahy Scale: Fair Sitting balance - Comments: sitting EOB   Standing balance support: Reliant on assistive device for balance, During functional activity, Bilateral upper extremity supported Standing balance-Leahy Scale: Poor     ADL either performed or assessed with clinical judgement   ADL Overall ADL's : Needs assistance/impaired Eating/Feeding: Independent;Sitting   Grooming: Set up;Sitting   Upper Body Bathing: Set up;Sitting   Lower Body Bathing: Moderate assistance;Bed level   Upper Body Dressing : Set up;Sitting   Lower Body Dressing: Moderate assistance;Bed level   Toilet Transfer: Minimal assistance;+2 for safety/equipment;BSC/3in1;Rolling walker (2 wheels)   Toileting- Clothing Manipulation and Hygiene: Minimal assistance;Sitting/lateral lean;Sit to/from stand         Vision Baseline Vision/History: 1 Wears  glasses Ability to See in Adequate Light: 0 Adequate Patient Visual Report: No change from baseline  Pertinent Vitals/Pain Pain Assessment Pain Assessment: No/denies pain     Hand Dominance Right   Extremity/Trunk Assessment Upper Extremity Assessment Upper Extremity Assessment: Generalized weakness (Right side weaker than left although improving)   Lower Extremity Assessment Lower Extremity Assessment: Defer to PT evaluation   Cervical / Trunk Assessment Cervical / Trunk Assessment: Normal   Communication Communication Communication: No difficulties;Other (comment) (SOB and struggled to catch her breath due to anxiety when talking)   Cognition Arousal/Alertness: Awake/alert Behavior During Therapy: Anxious Overall Cognitive Status: Within Functional Limits for tasks assessed     General Comments: Consistent anxiety and nervousness which increased during any functional mobility activity. Provided education on breathing techniques, grounding exercise and energy conservation technique of "leaving gas in the tank." Required education throughout evaluation     General Comments  SpO2 remained in mid-high 90's on RA.            Home Living Family/patient expects to be discharged to:: Private residence Living Arrangements: Alone Available Help at Discharge: Neighbor;Available PRN/intermittently;Friend(s);Family Type of Home: House Home Access: Stairs to enter [employer] of Steps: 3 Entrance Stairs-Rails: Right;Left;Can reach both Home Layout: Multi-level;Other (Comment) (split level house) Alternate Level Stairs-Number of Steps: 2 (from living area to bedroom)   Bathroom Shower/Tub: Chief Strategy Officer: Handicapped height Bathroom Accessibility: No   Home Equipment: Agricultural consultant (2 wheels);Rollator (4 wheels);Cane - single point;Grab bars - tub/shower;Shower seat;Electric scooter          Prior Functioning/Environment  Prior Level of Function : Independent/Modified Independent;Driving             Mobility Comments: Utilized RW over rolator due to available space. Usese cane outside the home for short distance and scooter for long distance.          OT Problem List: Decreased strength;Decreased range of motion;Decreased activity tolerance;Impaired balance (sitting and/or standing);Decreased cognition;Decreased safety awareness      OT Treatment/Interventions: Self-care/ADL training;Therapeutic exercise;Energy conservation;DME and/or AE instruction;Therapeutic activities;Cognitive remediation/compensation;Balance training;Patient/family education    OT Goals(Current goals can be found in the care plan section) Acute Rehab OT Goals OT Goal Formulation: Patient unable to participate in goal setting Time For Goal Achievement: 12/04/22 Potential to Achieve Goals: Good  OT Frequency: Min 1X/week    Co-evaluation PT/OT/SLP Co-Evaluation/Treatment: Yes Reason for Co-Treatment: To address functional/ADL transfers   OT goals addressed during session: ADL's and self-care;Strengthening/ROM;Other (comment) (energy conservation/anxiety)      AM-PAC OT "6 Clicks" Daily Activity     Outcome Measure Help from another person eating meals?: None Help from another person taking care of personal grooming?: A Little Help from another person toileting, which includes using toliet, bedpan, or urinal?: A Little Help from another person bathing (including washing, rinsing, drying)?: A Lot Help from another person to put on and taking off regular upper body clothing?: A Little Help from another person to put on and taking off regular lower body clothing?: A Lot 6 Click Score: 17   End of Session Equipment Utilized During Treatment: Rolling walker (2 wheels);Oxygen Nurse Communication: Mobility status  Activity Tolerance: Patient tolerated treatment well;Other (comment) (limited by anxiety) Patient left: in  bed;with call bell/phone within reach  OT Visit Diagnosis: Other symptoms and signs involving cognitive function;Muscle weakness (generalized) (M62.81)                Time: 1610-9604 OT Time Calculation (min): 39 min Charges:  OT General Charges $OT Visit: 1 Visit OT Evaluation $OT Eval High Complexity:  1 High OT Treatments $Therapeutic Activity: 8-22 mins  Limmie Patricia, OTR/L,CBIS  Supplemental OT - [facility] and [facility] Secure Chat Preferred    Estephany Perot, Charisse March 11/20/2022, 1:22 PM

## 2022-11-20 NOTE — Consult Note (Addendum)
Cardiology Consultation   Patient ID: [name] MRN: [id number]; DOB: [birth year]  Admit date: 11/18/2022 Date of Consult: 11/20/2022  PCP:  Kathleen Deiters, MD   [facility] Providers Cardiologist:  None   {   Patient Profile:   Kathleen Velazquez is a 67 y.o. female with a hx of anxiety, GERD, HTN, IBS, and sinus tachycardia who is being seen 11/20/2022 for the evaluation of right sided weakness at the request of Dr. Joseph Velazquez.  History of Present Illness:   Kathleen Velazquez is a 67 year old female with no significant cardiac history who presented with right sided weakness. CT head was unremarkable and MRI brain with no acute pathology. Labs were notably for potassium of 2.1 and therefore she was admitted for further management.  Was evaluated today and was noted to have crackles on exam. Follow-up CXR revealed mild cardiomegaly with pulmonary edema prompting TTE. Echo today showed severe biventricular failure with LVEF 25-30% by limited views and severe RV dysfunction. Cardiology is now consulted for further management.   Currently, the patient states that she feels okay. Has chronic shortness of breath that has been ongoing for years. Denies chest pain but reports indigestion. We discussed her echo results and she adamently states she wants no other procedures done.    Past Medical History:  Diagnosis Date   Anxiety    Arthritis    knees,    Ejection fraction    Normal, echo, June, 2013   GERD (gastroesophageal reflux disease)    Headache(784.0)    hx of migraines    History of hiatal hernia    Hypertension    Irritable bowel syndrome (IBS)    PONV (postoperative nausea and vomiting)    Sinus tachycardia    Persistent sinus tachycardia, June, 2013 ( prior monitor in 2007 had shown no significant abnormalities.)    Past Surgical History:  Procedure Laterality Date   CHOLECYSTECTOMY     ESOPHAGOGASTRODUODENOSCOPY N/A 08/19/2016   Procedure:  ESOPHAGOGASTRODUODENOSCOPY (EGD);  Surgeon: Malissa Hippo, MD;  Location: [facility];  Service: Endoscopy;  Laterality: N/A;  2:50   EYE SURGERY     cataract   HEMORRHOID SURGERY N/A 05/20/2013   Procedure: EXTENSIVE HEMORRHOIDECTOMY;  Surgeon: Dalia Heading, MD;  Location: [facility];  Service: General;  Laterality: N/A;   JOINT REPLACEMENT     right knee replacement    OTHER SURGICAL HISTORY     left breast biopsy - benign    OTHER SURGICAL HISTORY     hx of skin cancer surgery    TOTAL KNEE ARTHROPLASTY  11/28/2011   Procedure: TOTAL KNEE ARTHROPLASTY;  Surgeon: Loanne Drilling, MD;  Location: [facility];  Service: Orthopedics;  Laterality: Left;   TOTAL KNEE REVISION Right 02/26/2020   Procedure: TOTAL KNEE REVISION;  Surgeon: Ollen Gross, MD;  Location: [facility];  Service: Orthopedics;  Laterality: Right;    TUBAL LIGATION       Home Medications:  Prior to Admission medications   Medication Sig Start Date End Date Taking? Authorizing Provider  allopurinol (ZYLOPRIM) 100 MG tablet Take 100 mg by mouth daily. 06/22/22  Yes [provider]  gemfibrozil (LOPID) 600 MG tablet Take 600 mg by mouth 2 (two) times daily before a meal.   Yes [provider]  ibuprofen (ADVIL) 200 MG tablet Take 800 mg by mouth every 6 (six) hours as needed for headache or moderate pain.   Yes [provider]  metoprolol tartrate (LOPRESSOR) 25 MG tablet Take 25 mg by mouth 2 (two) times daily.   Yes [provider]    Inpatient Medications: Scheduled Meds:  gemfibrozil  600 mg Oral BID AC   heparin  5,000 Units Subcutaneous Q8H   melatonin  3 mg Oral QHS   metoprolol tartrate  25 mg Oral BID   scopolamine  1 patch Transdermal Q72H   Continuous Infusions:  PRN Meds: acetaminophen **OR** acetaminophen, loperamide, oxyCODONE  Allergies:    Allergies  Allergen Reactions   Codeine Nausea And Vomiting    Social History:   Social History   Socioeconomic  History   Marital status: Widowed    Spouse name: Not on file   Number of children: Not on file   Years of education: Not on file   Highest education level: Not on file  Occupational History   Not on file  Tobacco Use   Smoking status: Never   Smokeless tobacco: Never  Vaping Use   Vaping Use: Never used  Substance and Sexual Activity   Alcohol use: Not Currently    Comment: recovering alcoholic x 7 years    Drug use: No   Sexual activity: Not Currently    Birth control/protection: Post-menopausal  Other Topics Concern   Not on file  Social History Narrative   Not on file   Social Determinants of Health   Financial Resource Strain: Not on file  Food Insecurity: No Food Insecurity (11/19/2022)   Hunger Vital Sign    Worried About Running Out of Food in the Last Year: Never true    Ran Out of Food in the Last Year: Never true  Transportation Needs: No Transportation Needs (11/19/2022)   PRAPARE - Administrator, Civil Service (Medical): No    Lack of Transportation (Non-Medical): No  Physical Activity: Not on file  Stress: Not on file  Social Connections: Not on file  Intimate Partner Violence: Not At Risk (11/19/2022)   Humiliation, Afraid, Rape, and Kick questionnaire    Fear of Current or Ex-Partner: No    Emotionally Abused: No    Physically Abused: No    Sexually Abused: No    Family History:    Family History  Problem Relation Age of Onset   Irritable bowel syndrome Sister      ROS:  Please see the history of present illness.  All other ROS reviewed and negative.     Physical Exam/Data:   Vitals:   11/20/22 0019 11/20/22 0342 11/20/22 0750 11/20/22 1121  BP: 101/79 92/80 (!) 88/63 109/86  Pulse: 87 85 88 87  Resp: 20 20 19 19   Temp: 97.6 F (36.4 C) 97.8 F (36.6 C) 97.8 F (36.6 C) 97.9 F (36.6 C)  TempSrc: Oral Oral Oral Oral  SpO2: 98% 96% 96% 98%  Weight:      Height:       No intake or output data in the 24 hours ending  11/20/22 1538    11/19/2022    2:45 AM 11/18/2022    7:11 PM 02/26/2020   11:52 AM  Last 3 Weights  Weight (lbs) 132 lb 4.4 oz 139 lb 15.9 oz 139 lb 15.9 oz  Weight (kg) 60 kg 63.5 kg 63.5 kg     Body mass index is 24.19 kg/m.  General:  Comfortable, NAD HEENT: normal Neck: JVD to angle of the mandible Vascular: No carotid bruits; Distal pulses 2+ bilaterally Cardiac: RRR, no murmurs Lungs:  Crackles  in left lung base; diminished on right Abd: soft, nontender, no hepatomegaly  Ext: no edema Musculoskeletal:  No deformities, BUE and BLE strength normal and equal Skin: warm and dry  Neuro:  CNs 2-12 intact, no focal abnormalities noted Psych:  Normal affect   EKG:  The EKG was personally reviewed and demonstrates:  NSR, PACs, TWI in anterolateral leads Telemetry:  Telemetry was personally reviewed and demonstrates:  NSR/ST, PVCs and PVCs  Relevant CV Studies: TTE 11/20/22: IMPRESSIONS     1. Very difficult study due to poor echo windows and lack of  visualization of LV endocardium. However, based on limited views, there is  severe biventricular failure with estimated LVEF ~25% (endocardium very  poorly visualized) and severe RV dysfunction.   2. Left ventricular ejection fraction, by estimation, is 25 to 30%. The  left ventricle has severely decreased function. Left ventricular  endocardial border not optimally defined to evaluate regional wall motion.  There is mild left ventricular  hypertrophy. Left ventricular diastolic parameters are consistent with  Grade II diastolic dysfunction (pseudonormalization).   3. Right ventricular systolic function is severely reduced. The right  ventricular size is mildly enlarged.   4. Left atrial size was mildly dilated.   5. The mitral valve is degenerative. Mild to moderate mitral valve  regurgitation. Moderate mitral annular calcification.   6. The aortic valve is tricuspid. There is mild calcification of the  aortic valve. There is  mild thickening of the aortic valve. Aortic valve  regurgitation is not visualized. Aortic valve sclerosis/calcification is  present, without any evidence of  aortic stenosis.   7. The inferior vena cava is dilated in size with <50% respiratory  variability, suggesting right atrial pressure of 15 mmHg.   8. Moderate pericardial effusion. There is no evidence of cardiac  tamponade.   Comparison(s): No prior Echocardiogram.   Laboratory Data:  High Sensitivity Troponin:  No results for input(s): "TROPONINIHS" in the last 720 hours.   Chemistry Recent Labs  Lab 11/19/22 0345 11/19/22 0941 11/19/22 0942 11/19/22 1510 11/19/22 1910 11/20/22 0317  NA 144  --  145  --   --  140  K <2.0*  --  2.1* 2.4* 2.7* 3.9  CL 110  --  105  --   --  108  CO2 27  --  24  --   --  20*  GLUCOSE 104*  --  101*  --   --  162*  BUN 9  --  9  --   --  11  CREATININE 0.84  --  0.75  --   --  1.32*  CALCIUM 7.1*  --  7.9*  --   --  7.6*  MG 1.2* 2.9* 2.9*  --   --  2.8*  GFRNONAA >60  --  >60  --   --  44*  ANIONGAP 7  --  16*  --   --  12    Recent Labs  Lab 11/19/22 0345 11/19/22 0942 11/20/22 0317  PROT 5.1* 5.5* 5.6*  ALBUMIN 2.8* 3.0* 2.9*  AST 61* 48* 75*  ALT 26 27 42  ALKPHOS 80 88 95  BILITOT 1.3* 1.1 1.9*   Lipids No results for input(s): "CHOL", "TRIG", "HDL", "LABVLDL", "LDLCALC", "CHOLHDL" in the last 168 hours.  Hematology Recent Labs  Lab 11/19/22 0347 11/19/22 0942 11/20/22 0317  WBC 5.7 6.4 9.9  RBC 3.58* 3.73* 3.77*  HGB 12.4 12.8 13.3  HCT 35.4* 38.0 39.6  MCV 98.9  101.9* 105.0*  MCH 34.6* 34.3* 35.3*  MCHC 35.0 33.7 33.6  RDW 14.4 14.6 14.7  PLT 142* 135* 150   Thyroid  Recent Labs  Lab 11/19/22 0345 11/19/22 0941  TSH 6.980*  --   FREET4  --  0.71    BNPNo results for input(s): "BNP", "PROBNP" in the last 168 hours.  DDimer No results for input(s): "DDIMER" in the last 168 hours.   Radiology/Studies:  ECHOCARDIOGRAM COMPLETE  Result Date:  11/20/2022    ECHOCARDIOGRAM REPORT   Patient Name:   Kathleen Velazquez Date of Exam: 11/20/2022 Medical Rec #:  [id number]       Height:       62.0 in Accession #:    [id number]      Weight:       132.3 lb Date of Birth:  [birth year]       BSA:          1.604 m Patient Age:    67 years        BP:           88/63 mmHg Patient Gender: F               HR:           80 bpm. Exam Location:  Inpatient Procedure: 2D Echo, Cardiac Doppler and Color Doppler Indications:    Acute respiratory distress R06.03  History:        Patient has no prior history of Echocardiogram examinations.                 Arrythmias:Tachycardia; Risk Factors:Dyslipidemia.  Sonographer:    Lucendia Herrlich Referring Phys: [id number] AMRIT ADHIKARI IMPRESSIONS  1. Very difficult study due to poor echo windows and lack of visualization of LV endocardium. However, based on limited views, there is severe biventricular failure with estimated LVEF ~25% (endocardium very poorly visualized) and severe RV dysfunction.  2. Left ventricular ejection fraction, by estimation, is 25 to 30%. The left ventricle has severely decreased function. Left ventricular endocardial border not optimally defined to evaluate regional wall motion. There is mild left ventricular hypertrophy. Left ventricular diastolic parameters are consistent with Grade II diastolic dysfunction (pseudonormalization).  3. Right ventricular systolic function is severely reduced. The right ventricular size is mildly enlarged.  4. Left atrial size was mildly dilated.  5. The mitral valve is degenerative. Mild to moderate mitral valve regurgitation. Moderate mitral annular calcification.  6. The aortic valve is tricuspid. There is mild calcification of the aortic valve. There is mild thickening of the aortic valve. Aortic valve regurgitation is not visualized. Aortic valve sclerosis/calcification is present, without any evidence of aortic stenosis.  7. The inferior vena cava is dilated in size with <50%  respiratory variability, suggesting right atrial pressure of 15 mmHg.  8. Moderate pericardial effusion. There is no evidence of cardiac tamponade. Comparison(s): No prior Echocardiogram. FINDINGS  Left Ventricle: Left ventricular ejection fraction, by estimation, is 25 to 30%. The left ventricle has severely decreased function. Left ventricular endocardial border not optimally defined to evaluate regional wall motion. The left ventricular internal cavity size was normal in size. There is mild left ventricular hypertrophy. Left ventricular diastolic parameters are consistent with Grade II diastolic dysfunction (pseudonormalization). Right Ventricle: The right ventricular size is mildly enlarged. Right vetricular wall thickness was not well visualized. Right ventricular systolic function is severely reduced. The tricuspid regurgitant velocity is 2.05 m/s, and with an assumed right atrial pressure of 15 mmHg, the  estimated right ventricular systolic pressure is 31.8 mmHg. Left Atrium: Left atrial size was mildly dilated. Right Atrium: Right atrial size was normal in size. Pericardium: A moderately sized pericardial effusion is present. There is no evidence of cardiac tamponade. Mitral Valve: The mitral valve is degenerative in appearance. Moderate mitral annular calcification. Mild to moderate mitral valve regurgitation. Tricuspid Valve: The tricuspid valve is normal in structure. Tricuspid valve regurgitation is mild. Aortic Valve: The aortic valve is tricuspid. There is mild calcification of the aortic valve. There is mild thickening of the aortic valve. Aortic valve regurgitation is not visualized. Aortic valve sclerosis/calcification is present, without any evidence of aortic stenosis. Aortic valve peak gradient measures 4.2 mmHg. Pulmonic Valve: The pulmonic valve was normal in structure. Pulmonic valve regurgitation is trivial. Aorta: The aortic root and ascending aorta are structurally normal, with no evidence  of dilitation. Venous: The inferior vena cava is dilated in size with less than 50% respiratory variability, suggesting right atrial pressure of 15 mmHg. IAS/Shunts: The atrial septum is grossly normal.  LEFT VENTRICLE PLAX 2D LVIDd:         4.30 cm   Diastology LVIDs:         3.00 cm   LV e' lateral:   6.18 cm/s LV PW:         1.10 cm   LV E/e' lateral: 12.4 LV IVS:        1.20 cm LVOT diam:     2.00 cm LV SV:         32 LV SV Index:   20 LVOT Area:     3.14 cm  LEFT ATRIUM           Index        RIGHT ATRIUM           Index LA diam:      4.20 cm 2.62 cm/m   RA Area:     14.20 cm LA Vol (A4C): 60.9 ml 37.98 ml/m  RA Volume:   34.50 ml  21.51 ml/m  AORTIC VALVE AV Area (Vmax): 1.94 cm AV Vmax:        102.00 cm/s AV Peak Grad:   4.2 mmHg LVOT Vmax:      62.93 cm/s LVOT Vmean:     39.400 cm/s LVOT VTI:       0.103 m  AORTA Ao Root diam: 3.00 cm Ao Asc diam:  3.20 cm MITRAL VALVE               TRICUSPID VALVE MV Area (PHT): 6.96 cm    TR Peak grad:   16.8 mmHg MV Decel Time: 109 msec    TR Vmax:        205.00 cm/s MV E velocity: 76.70 cm/s MV A velocity: 40.70 cm/s  SHUNTS MV E/A ratio:  1.88        Systemic VTI:  0.10 m                            Systemic Diam: 2.00 cm Laurance Flatten MD Electronically signed by Laurance Flatten MD Signature Date/Time: 11/20/2022/3:28:17 PM    Final    DG CHEST PORT 1 VIEW  Result Date: 11/19/2022 CLINICAL DATA:  Shortness of breath. EXAM: PORTABLE CHEST 1 VIEW COMPARISON:  July 30, 2016. FINDINGS: Mild cardiomegaly is noted with central pulmonary vascular congestion. Probable mild bilateral pulmonary edema is noted. Small bilateral pleural effusions are noted. Bony thorax  is unremarkable. IMPRESSION: Mild cardiomegaly with central pulmonary vascular congestion and probable bilateral pulmonary edema. Electronically Signed   By: Lupita Raider M.D.   On: 11/19/2022 14:20   MR BRAIN W WO CONTRAST  Result Date: 11/19/2022 CLINICAL DATA:  Stroke suspected EXAM: MRI  HEAD WITHOUT AND WITH CONTRAST TECHNIQUE: Multiplanar, multiecho pulse sequences of the brain and surrounding structures were obtained without and with intravenous contrast. CONTRAST:  6mL GADAVIST GADOBUTROL 1 MMOL/ML IV SOLN COMPARISON:  CT head 11/18/2022 FINDINGS: Brain: Negative for an acute infarct. No hemorrhage. No hydrocephalus. No extra-axial fluid collection. Mild for age chronic microvascular ischemic change. Age-appropriate volume loss. No mass effect. No mass lesion. Vascular: Normal flow voids. Skull and upper cervical spine: Normal marrow signal. Sinuses/Orbits: Trace bilateral mastoid effusions. No middle ear effusion. Paranasal sinuses are notable for mucosal thickening in the bilateral sphenoid sinuses. Bilateral lens replacement. Orbits are otherwise unremarkable. Other: None. IMPRESSION: No acute intracranial process. Electronically Signed   By: Lorenza Cambridge M.D.   On: 11/19/2022 07:02   CT Head Wo Contrast  Result Date: 11/18/2022 CLINICAL DATA:  CVA, right arm weakness EXAM: CT HEAD WITHOUT CONTRAST TECHNIQUE: Contiguous axial images were obtained from the base of the skull through the vertex without intravenous contrast. RADIATION DOSE REDUCTION: This exam was performed according to the departmental dose-optimization program which includes automated exposure control, adjustment of the mA and/or kV according to patient size and/or use of iterative reconstruction technique. COMPARISON:  07/27/2016 FINDINGS: Brain: No acute intracranial findings are seen. There are no signs of bleeding within the cranium. Cortical sulci are prominent. Vascular: Scattered arterial calcifications are seen. Skull: No acute findings are seen. Sinuses/Orbits: There is mucosal thickening in sphenoid sinus. There are no air-fluid levels in the sinuses. Other: None IMPRESSION: No acute intracranial findings are seen in noncontrast CT brain. Atrophy. Chronic sphenoid sinusitis. Electronically Signed   By: Ernie Avena M.D.   On: 11/18/2022 20:55     Assessment and Plan:   #Severe Biventricular HF: -TTE with LVEF 25-30%, severe RV dysfunction which is newly diagnosed -Currently overloaded with elevated JVD and pulmonary edema on CXR; will check BNP -ECG with NSR, PVCs, TWI in anterolateral leads but these are chronic -Etiology of BiV failure unknown with no history of ischemic heart disease or known cardiomyopathy -No known history of PE but will check d-dimer given degree of RV dysfunction as patient does not want any further scanning currently -Given she was admitted for hypokalemia, will ensure this stabilizes before initiation of lasix and repeat K now for monitoring prior to initiating IV diuresis  -Stop metop due to concern for low output state -Would ideally like to proceed with LHC/RHC and possible CMR but patient is adamently declining further procedures or repeat MRI imaging -Will add GDMT as tolerated but BP may limit this; will start with low dose spiro today to see if she tolerates -Discussed with family as well and they will talk to the patient; ultimately she may opt for GDMT as tolerated with repeat TTE as outpatient to assess for interval change  #Hypokalemia: -Occurred in the setting of diarrhea with K as low as 2.1 which has improved with supplementation -Will repeat K now to ensure stable prior to initiating diuresis; will need to supplement K when giving IV lasix  #Sinus tachycardia: -Hold beta blocker for now given concern for low output state  #Frequent PVCs: -Occurred in the setting of profound hypokalemia as well as BiV failure as above -  K repleted and improved on labs -Holding BB due to concern for low output state     Risk Assessment/Risk Scores:      New York Heart Association (NYHA) Functional Class NYHA Class III        For questions or updates, please contact [facility] Please consult [email or web address] for contact info under     Signed, Meriam Sprague, MD  11/20/2022 3:38 PM

## 2022-11-20 NOTE — Progress Notes (Signed)
Echocardiogram 2D Echocardiogram has been performed.  Lucendia Herrlich 11/20/2022, 3:05 PM

## 2022-11-20 NOTE — Evaluation (Signed)
Physical Therapy Evaluation Patient Details Name: Kathleen Velazquez MRN: [id number] DOB: 1955-10-14 Today's Date: 11/20/2022  History of Present Illness  Pt is a 67 yo female presenting to ED with reports of R sided weakness. Imaging without acute findings, hospital course complicated by low potassium. PMH: anxiety, GERD, HTN, IBS, sinus tachycardia, arthritis.    Clinical Impression  Pt in bed upon arrival of PT, agreeable to evaluation at this time. Prior to admission the pt was ambulating in her home with use of RW, but describes significant limitation by poor endurance with mobility and self-care. The pt was up and sitting EOB upon arrival, reports very limited movement since admission. The pt required minA to rise and steady with all sit-stand transfers, but was able to complete short-distance ambulation in the room (10-15 ft) with minA and use of RW. She c/o SOB, but SpO2 >96% on RA with all mobility, suspect SOB is due to anxiety with all movement. The pt will continue to benefit from skilled PT to progress endurance and strength in LE to allow for greater independence with mobility and facilitate return home with greatest safety.      Recommendations for follow up therapy are one component of a multi-disciplinary discharge planning process, led by the attending physician.  Recommendations may be updated based on patient status, additional functional criteria and insurance authorization.  Follow Up Recommendations       Assistance Recommended at Discharge Intermittent Supervision/Assistance  Patient can return home with the following  A little help with walking and/or transfers;A little help with bathing/dressing/bathroom;Assistance with cooking/housework;Direct supervision/assist for medications management;Direct supervision/assist for financial management;Assist for transportation;Help with stairs or ramp for entrance    Equipment Recommendations None recommended by PT  Recommendations  for Other Services       Functional Status Assessment Patient has had a recent decline in their functional status and demonstrates the ability to make significant improvements in function in a reasonable and predictable amount of time.     Precautions / Restrictions Precautions Precautions: Fall (anxiety) Restrictions Weight Bearing Restrictions: No      Mobility  Bed Mobility Overal bed mobility: Needs Assistance             General bed mobility comments: sitting on EOB at start and end of session    Transfers Overall transfer level: Needs assistance Equipment used: Rolling walker (2 wheels) Transfers: Sit to/from Stand, Bed to chair/wheelchair/BSC Sit to Stand: Min assist, +2 physical assistance, From elevated surface   Step pivot transfers: Min assist, +2 physical assistance       General transfer comment: During first transfer, pt became anxious and attempted to sit very quickly. Second transfer pt did not become as anxious as before. Did leave her RW while performing a 360 degree turn to sit requiring VC for safety.    Ambulation/Gait Ambulation/Gait assistance: Min guard Gait Distance (Feet): 15 Feet (+ 10 ft) Assistive device: Rolling walker (2 wheels) Gait Pattern/deviations: Step-through pattern, Decreased stride length, Shuffle Gait velocity: decreased Gait velocity interpretation: <1.31 ft/sec, indicative of household ambulator   General Gait Details: pt with small shuffling steps but no overt LOB or buckling. SpO2 stable >96% but pt c/o anxiety and SOB      Balance Overall balance assessment: Needs assistance Sitting-balance support: Bilateral upper extremity supported, Feet supported Sitting balance-Leahy Scale: Fair Sitting balance - Comments: sitting EOB   Standing balance support: Reliant on assistive device for balance, During functional activity, Bilateral upper extremity supported Standing balance-Leahy  Scale: Poor                                Pertinent Vitals/Pain Pain Assessment Pain Assessment: No/denies pain    Home Living Family/patient expects to be discharged to:: Private residence Living Arrangements: Alone Available Help at Discharge: Neighbor;Available PRN/intermittently;Friend(s);Family Type of Home: House Home Access: Stairs to enter Entrance Stairs-Rails: Right;Left;Can reach both Entrance Stairs-Number of Steps: 3 Alternate Level Stairs-Number of Steps: 2 (from living area to bedroom) Home Layout: Multi-level;Other (Comment) (split level house) Home Equipment: Rolling Walker (2 wheels);Rollator (4 wheels);Cane - single point;Grab bars - tub/shower;Shower seat;Electric scooter      Prior Function Prior Level of Function : Independent/Modified Independent;Driving             Mobility Comments: Utilized RW over rolator due to available space. Usese cane outside the home for short distance and scooter for long distance.       Hand Dominance   Dominant Hand: Right    Extremity/Trunk Assessment   Upper Extremity Assessment Upper Extremity Assessment: Defer to OT evaluation    Lower Extremity Assessment Lower Extremity Assessment: Generalized weakness (good strength to MMT, but limited endurance and pt dependent on BUE support to rise to standing.)    Cervical / Trunk Assessment Cervical / Trunk Assessment: Normal  Communication   Communication: No difficulties;Other (comment) (SOB and struggled to catch her breath due to anxiety when talking)  Cognition Arousal/Alertness: Awake/alert Behavior During Therapy: Anxious Overall Cognitive Status: Within Functional Limits for tasks assessed                                 General Comments: Consistent anxiety and nervousness which increased during any functional mobility activity. Provided education on breathing techniques, grounding exercise and energy conservation technique of "leaving gas in the tank." Required  education throughout evaluation        General Comments General comments (skin integrity, edema, etc.): SpO2 remained in mid-high 90's on RA.    Exercises     Assessment/Plan    PT Assessment Patient needs continued PT services  PT Problem List Decreased strength;Decreased activity tolerance;Decreased balance;Decreased mobility       PT Treatment Interventions DME instruction;Gait training;Stair training;Functional mobility training;Therapeutic activities;Therapeutic exercise;Balance training    PT Goals (Current goals can be found in the Care Plan section)  Acute Rehab PT Goals Patient Stated Goal: return to independence PT Goal Formulation: With patient Time For Goal Achievement: 12/04/22 Potential to Achieve Goals: Good    Frequency Min 1X/week     Co-evaluation PT/OT/SLP Co-Evaluation/Treatment: Yes Reason for Co-Treatment: To address functional/ADL transfers PT goals addressed during session: Mobility/safety with mobility;Balance;Proper use of DME;Strengthening/ROM OT goals addressed during session: ADL's and self-care;Strengthening/ROM;Other (comment) (energy conservation/anxiety)       AM-PAC PT "6 Clicks" Mobility  Outcome Measure Help needed turning from your back to your side while in a flat bed without using bedrails?: None Help needed moving from lying on your back to sitting on the side of a flat bed without using bedrails?: None Help needed moving to and from a bed to a chair (including a wheelchair)?: A Little Help needed standing up from a chair using your arms (e.g., wheelchair or bedside chair)?: A Little Help needed to walk in hospital room?: A Little Help needed climbing 3-5 steps with a railing? : Total 6 Click Score:  18    End of Session Equipment Utilized During Treatment: Gait belt Activity Tolerance: Patient tolerated treatment well Patient left: in bed;with call bell/phone within reach (sitting EOB) Nurse Communication: Mobility status (pt  on RA) PT Visit Diagnosis: Unsteadiness on feet (R26.81);Other abnormalities of gait and mobility (R26.89);Repeated falls (R29.6);Muscle weakness (generalized) (M62.81)    Time: 7628-3151 PT Time Calculation (min) (ACUTE ONLY): 38 min   Charges:   PT Evaluation $PT Eval Low Complexity: 1 Low          Vickki Muff, PT, DPT   Acute Rehabilitation Department Office [phone] Secure Chat Communication Preferred  Ronnie Derby 11/20/2022, 1:30 PM

## 2022-11-21 ENCOUNTER — Inpatient Hospital Stay (HOSPITAL_COMMUNITY): Payer: Medicare Other

## 2022-11-21 DIAGNOSIS — K625 Hemorrhage of anus and rectum: Secondary | ICD-10-CM | POA: Diagnosis not present

## 2022-11-21 DIAGNOSIS — Z515 Encounter for palliative care: Secondary | ICD-10-CM

## 2022-11-21 DIAGNOSIS — E876 Hypokalemia: Secondary | ICD-10-CM | POA: Diagnosis not present

## 2022-11-21 DIAGNOSIS — I50810 Right heart failure, unspecified: Secondary | ICD-10-CM

## 2022-11-21 DIAGNOSIS — I5041 Acute combined systolic (congestive) and diastolic (congestive) heart failure: Secondary | ICD-10-CM | POA: Diagnosis not present

## 2022-11-21 DIAGNOSIS — Z7189 Other specified counseling: Secondary | ICD-10-CM

## 2022-11-21 DIAGNOSIS — R531 Weakness: Secondary | ICD-10-CM | POA: Diagnosis not present

## 2022-11-21 LAB — CBC WITH DIFFERENTIAL/PLATELET
Abs Immature Granulocytes: 0.03 10*3/uL (ref 0.00–0.07)
Basophils Absolute: 0 10*3/uL (ref 0.0–0.1)
Basophils Relative: 0 %
Eosinophils Absolute: 0 10*3/uL (ref 0.0–0.5)
Eosinophils Relative: 0 %
HCT: 35.1 % — ABNORMAL LOW (ref 36.0–46.0)
Hemoglobin: 11.2 g/dL — ABNORMAL LOW (ref 12.0–15.0)
Immature Granulocytes: 0 %
Lymphocytes Relative: 29 %
Lymphs Abs: 2 10*3/uL (ref 0.7–4.0)
MCH: 34.1 pg — ABNORMAL HIGH (ref 26.0–34.0)
MCHC: 31.9 g/dL (ref 30.0–36.0)
MCV: 107 fL — ABNORMAL HIGH (ref 80.0–100.0)
Monocytes Absolute: 0.7 10*3/uL (ref 0.1–1.0)
Monocytes Relative: 10 %
Neutro Abs: 4.1 10*3/uL (ref 1.7–7.7)
Neutrophils Relative %: 61 %
Platelets: 109 10*3/uL — ABNORMAL LOW (ref 150–400)
RBC: 3.28 MIL/uL — ABNORMAL LOW (ref 3.87–5.11)
RDW: 14.5 % (ref 11.5–15.5)
WBC: 6.9 10*3/uL (ref 4.0–10.5)
nRBC: 0 % (ref 0.0–0.2)

## 2022-11-21 LAB — HEMOGLOBIN AND HEMATOCRIT, BLOOD
HCT: 37.1 % (ref 36.0–46.0)
Hemoglobin: 11.9 g/dL — ABNORMAL LOW (ref 12.0–15.0)

## 2022-11-21 LAB — PHOSPHORUS: Phosphorus: 4.5 mg/dL (ref 2.5–4.6)

## 2022-11-21 LAB — COMPREHENSIVE METABOLIC PANEL
ALT: 88 U/L — ABNORMAL HIGH (ref 0–44)
AST: 108 U/L — ABNORMAL HIGH (ref 15–41)
Albumin: 2.8 g/dL — ABNORMAL LOW (ref 3.5–5.0)
Alkaline Phosphatase: 78 U/L (ref 38–126)
Anion gap: 13 (ref 5–15)
BUN: 14 mg/dL (ref 8–23)
CO2: 21 mmol/L — ABNORMAL LOW (ref 22–32)
Calcium: 8.3 mg/dL — ABNORMAL LOW (ref 8.9–10.3)
Chloride: 105 mmol/L (ref 98–111)
Creatinine, Ser: 1.49 mg/dL — ABNORMAL HIGH (ref 0.44–1.00)
GFR, Estimated: 38 mL/min — ABNORMAL LOW (ref >60)
Glucose, Bld: 136 mg/dL — ABNORMAL HIGH (ref 70–99)
Potassium: 4.2 mmol/L (ref 3.5–5.1)
Sodium: 139 mmol/L (ref 135–145)
Total Bilirubin: 1.7 mg/dL — ABNORMAL HIGH (ref 0.3–1.2)
Total Protein: 5.5 g/dL — ABNORMAL LOW (ref 6.5–8.1)

## 2022-11-21 LAB — PROTIME-INR
INR: 1.2 (ref 0.8–1.2)
Prothrombin Time: 15.5 seconds — ABNORMAL HIGH (ref 11.4–15.2)

## 2022-11-21 LAB — APTT: aPTT: 29 seconds (ref 24–36)

## 2022-11-21 LAB — MAGNESIUM: Magnesium: 2 mg/dL (ref 1.7–2.4)

## 2022-11-21 MED ORDER — HEPARIN BOLUS VIA INFUSION
1000.0000 [IU] | Freq: Once | INTRAVENOUS | Status: AC
  Administered 2022-11-21: 1000 [IU] via INTRAVENOUS
  Filled 2022-11-21: qty 1000

## 2022-11-21 MED ORDER — LORAZEPAM 0.5 MG PO TABS
0.5000 mg | ORAL_TABLET | Freq: Once | ORAL | Status: AC | PRN
  Administered 2022-11-22: 0.5 mg via ORAL
  Filled 2022-11-21: qty 1

## 2022-11-21 MED ORDER — HEPARIN (PORCINE) 25000 UT/250ML-% IV SOLN
1000.0000 [IU]/h | INTRAVENOUS | Status: DC
  Administered 2022-11-21: 1000 [IU]/h via INTRAVENOUS
  Filled 2022-11-21: qty 250

## 2022-11-21 MED ORDER — TECHNETIUM TO 99M ALBUMIN AGGREGATED
3.6000 | Freq: Once | INTRAVENOUS | Status: AC | PRN
  Administered 2022-11-21: 3.6 via INTRAVENOUS

## 2022-11-21 NOTE — Progress Notes (Signed)
ANTICOAGULATION CONSULT NOTE - Initial Consult  Pharmacy Consult for Heparin Indication: rule out pulmonary embolus  Allergies  Allergen Reactions   Codeine Nausea And Vomiting    Patient Measurements: Height: 5\' 2"  (157.5 cm) Weight: 60 kg (132 lb 4.4 oz) IBW/kg (Calculated) : 50.1 Heparin Dosing Weight: total weight  Vital Signs: Temp: 98.3 F (36.8 C) (04/15 0818) Temp Source: Oral (04/15 0818) BP: 117/78 (04/15 0818) Pulse Rate: 90 (04/15 0818)  Labs: Recent Labs    11/19/22 0942 11/20/22 0317 11/21/22 0351  HGB 12.8 13.3 11.2*  HCT 38.0 39.6 35.1*  PLT 135* 150 109*  CREATININE 0.75 1.32* 1.49*    Estimated Creatinine Clearance: 28.2 mL/min (A) (by C-G formula based on SCr of 1.49 mg/dL (H)).   Medical History: Past Medical History:  Diagnosis Date   Anxiety    Arthritis    knees,    Ejection fraction    Normal, echo, June, 2013   GERD (gastroesophageal reflux disease)    Headache(784.0)    hx of migraines    History of hiatal hernia    Hypertension    Irritable bowel syndrome (IBS)    PONV (postoperative nausea and vomiting)    Sinus tachycardia    Persistent sinus tachycardia, June, 2013 ( prior monitor in 2007 had shown no significant abnormalities.)    Medications:  Scheduled:   gemfibrozil  600 mg Oral BID AC   heparin  5,000 Units Subcutaneous Q8H   melatonin  3 mg Oral QHS   scopolamine  1 patch Transdermal Q72H   spironolactone  12.5 mg Oral Daily   Infusions:  PRN: acetaminophen **OR** acetaminophen, loperamide, oxyCODONE  Assessment: 67 yo female with no significant cardiac history admitted with right sided weakness. Head CT and MRI did not show any acute findings. Pharmacy is consulted to dose IV heparin for empiric treatment of PE with elevated d-dimer. VQ scan ordered. No AC prior to admit, Plts slightly low at 109k, Hgb decreased to 11.2 from admission. Received heparin 5k SQ at 6am this morning.  Goal of Therapy:  Heparin  level 0.3-0.7 units/ml Monitor platelets by anticoagulation protocol: Yes   Plan:  Give 1000 units bolus x 1 Start heparin infusion at 1000 units/hr Check anti-Xa level in 8 hours and daily while on heparin Continue to monitor H&H and platelets  Loralee Pacas, PharmD, BCPS Please see amion for complete clinical pharmacist phone list 11/21/2022,8:47 AM

## 2022-11-21 NOTE — Progress Notes (Signed)
Occupational Therapy Treatment Patient Details Name: Kathleen Velazquez MRN: [id number] DOB: 04-29-1956 Today's Date: 11/21/2022   History of present illness Pt is a 67 yo female presenting to ED with reports of R sided weakness. Imaging without acute findings, hospital course complicated by low potassium. PMH: anxiety, GERD, HTN, IBS, sinus tachycardia, arthritis   OT comments  Pt making progress with functional goals. Pt sitting EOB upon arrival. Pt educated on energy conservation techniques and breathing techniques with handuots provided. Pt's O2 SATs remained 95-97% throughout during toilet transfers/toileting tasks. OT will continue to follow acutely to maximize level of function and safety   Recommendations for follow up therapy are one component of a multi-disciplinary discharge planning process, led by the attending physician.  Recommendations may be updated based on patient status, additional functional criteria and insurance authorization.    Assistance Recommended at Discharge Intermittent Supervision/Assistance  Patient can return home with the following  A little help with bathing/dressing/bathroom;Assistance with cooking/housework;Assist for transportation;Help with stairs or ramp for entrance;A little help with walking and/or transfers   Equipment Recommendations  BSC/3in1    Recommendations for Other Services      Precautions / Restrictions Precautions Precautions: Fall       Mobility Bed Mobility               General bed mobility comments: sitting on EOB at start and end of session    Transfers Overall transfer level: Needs assistance Equipment used: 1 person hand held assist Transfers: Sit to/from Stand, Bed to chair/wheelchair/BSC Sit to Stand: Min assist     Step pivot transfers: Min assist           Balance Overall balance assessment: Needs assistance Sitting-balance support: Bilateral upper extremity supported, Feet supported Sitting  balance-Leahy Scale: Good     Standing balance support: During functional activity, Bilateral upper extremity supported, Single extremity supported Standing balance-Leahy Scale: Poor                             ADL either performed or assessed with clinical judgement   ADL                           Toilet Transfer: Minimal assistance;Min guard;Stand-pivot;BSC/3in1   Toileting- Clothing Manipulation and Hygiene: Minimal assistance;Sit to/from stand         General ADL Comments: Pt educated on energy conservation techniques and breathing techniques with handuots provided. Pt's O2 SATs remained 95-97% throughout    Extremity/Trunk Assessment Upper Extremity Assessment Upper Extremity Assessment: Generalized weakness   Lower Extremity Assessment Lower Extremity Assessment: Defer to PT evaluation   Cervical / Trunk Assessment Cervical / Trunk Assessment: Normal    Vision Ability to See in Adequate Light: 0 Adequate Patient Visual Report: No change from baseline     Perception     Praxis      Cognition Arousal/Alertness: Awake/alert Behavior During Therapy: Flat affect Overall Cognitive Status: Within Functional Limits for tasks assessed                                          Exercises      Shoulder Instructions       General Comments      Pertinent Vitals/ Pain       Pain Assessment Pain Assessment:  No/denies pain  Home Living                                          Prior Functioning/Environment              Frequency  Min 1X/week        Progress Toward Goals  OT Goals(current goals can now be found in the care plan section)  Progress towards OT goals: Progressing toward goals     Plan Discharge plan remains appropriate    Co-evaluation                 AM-PAC OT "6 Clicks" Daily Activity     Outcome Measure   Help from another person eating meals?: None Help from  another person taking care of personal grooming?: A Little Help from another person toileting, which includes using toliet, bedpan, or urinal?: A Little Help from another person bathing (including washing, rinsing, drying)?: A Lot Help from another person to put on and taking off regular upper body clothing?: A Little Help from another person to put on and taking off regular lower body clothing?: A Lot 6 Click Score: 17    End of Session Equipment Utilized During Treatment: Rolling walker (2 wheels)  OT Visit Diagnosis: Muscle weakness (generalized) (M62.81)   Activity Tolerance Patient tolerated treatment well   Patient Left in bed;with call bell/phone within reach;Other (comment) (sitting EOB)   Nurse Communication          Time: 7915-0569 OT Time Calculation (min): 17 min  Charges: OT General Charges $OT Visit: 1 Visit OT Treatments $Therapeutic Activity: 8-22 mins    Galen Manila 11/21/2022, 2:30 PM

## 2022-11-21 NOTE — Progress Notes (Signed)
PROGRESS NOTE    Kathleen Velazquez  GEX:[id number] DOB: 02-Jul-1956 DOA: 11/18/2022 PCP: Toma Deiters, MD     Brief Narrative:   67 y.o. WF PMHx Anxiety, GERD, HTN, sinus tachycardia, irritable bowel syndrome,  Presents the ED with a chief complaint of right sided weakness.  Patient reports that she had right-sided weakness that started during the night between the 11th and 12th.  At 3 PM on the 12th she noted she could not move her right arm at all.  She could not lift it up and left she grabbed it with the left hand to lift it up.  She reports she was having difficulty with ambulation with weakness in her right leg, but the right leg was not dragging.  She had no numbness in her right leg or arm.  Patient reports no choking, slurred speech, drooling, facial droop.  She had no history of stroke to her knowledge.  She reports no associated headache, change in vision, change in hearing.  Patient has very low potassium at presentation.  She reports she has diarrhea all the time.  Every time she eats, and then sometimes even when she is not trying to eat she has diarrhea.  She reports this is due to IBS.  She had low potassium in the past.  She is not on any potassium supplementation at home.  Patient reports she has had some abdominal pain that is been consistent with her normal IBS pain.  She has not had any urinary frequency.  Patient has no other complaints at this time.   Patient does not smoke, does not drink, does not use illicit drugs.  She is vaccinated for COVID and flu.  Patient is full code.   Subjective: 4/15/A/O x 4 sitting on the edge of bed.  Very anxious concerning the acute cardiac findings.   Assessment & Plan: Covid vaccination;   Principal Problem:   Right sided weakness Active Problems:   Sinus tachycardia   Hypokalemia   IBS (irritable bowel syndrome)   Hyperlipidemia   Acute combined systolic and diastolic heart failure   Right ventricular failure    Gastrointestinal hemorrhage associated with anorectal source  Acute systolic and diastolic CHF/pericardial effusion - Severe see results below - Per Dr. Laurance Flatten cardiology patient refuses any other studies/interventions. -4/15 Spoke at length with patient and she agrees to do whatever cardiology requires to improve her cardiac status.  To include cardiac MRI, cardiac catheterization etc. have notified palliative care and cardiology  Right sided weakness - Right upper and lower extremity weakness - Last known well was the night of the 11th - CT head shows no acute intracranial findings - Neurology consulted and thinks this is likely due to the potassium but did recommend MRI - MRI ordered for the a.m. - PT and OT eval and treat - Every 4 hours neurochecks - Continue to monitor -4/14 PT/OT evaluation in a.m. for CIR vs SNF  Elevated D-dimer Lab Results  Component Value Date   DDIMER 2.94 (H) 11/20/2022  -Mildly elevated not significantly worrisome for PE/DVT but given patient's multiple medical problems to be on the safe side recommend VQ scan - 4/14 Per Dr. Laurance Flatten cardiology patient refuses any other studies/interventions. -4/15 PA Perlie Gold cardiology able to convince patient to at least except VQ scan pending ADDENDUM VQ scan negative for PE   Hyperlipidemia - Gemfibrozil 600 mg BID   IBS (irritable bowel syndrome) - Likely the etiology behind hypokalemia - Will add  Imodium if needed - No diarrhea since being in the ER   Hypokalemia -Potassium goal.4 - Potassium is undetectable less than 2 - Replaced with 40 mill equivalents in normal saline overnight, 40 mEq p.o., 10 mEq IV x 2 in the ED - Likely due to to GI losses in the setting of diarrhea predominant IBS -4/14 K-Dur  Hypocalcemia - Calcium goal> 8.9 - 4/14 corrected calcium= 8.5 - 4/14 Calcium Gluconate IV 2 g   Sinus tachycardia - Continue home metoprolol  Goals of care - 4/14  palliative care consult: Patient with SEVERE acute systolic and diastolic CHF, refuses any interventions evaluate for change of CODE STATUS to DNR, evaluate for palliative care/hospice       Mobility Assessment (last 72 hours)     Mobility Assessment     Row Name 11/21/22 0800 11/20/22 1945 11/20/22 1322 11/20/22 1306 11/19/22 2000   Does patient have an order for bedrest or is patient medically unstable No - Continue assessment No - Continue assessment -- -- No - Continue assessment   What is the highest level of mobility based on the progressive mobility assessment? Level 5 (Walks with assist in room/hall) - Balance while stepping forward/back and can walk in room with assist - Complete Level 5 (Walks with assist in room/hall) - Balance while stepping forward/back and can walk in room with assist - Complete Level 5 (Walks with assist in room/hall) - Balance while stepping forward/back and can walk in room with assist - Complete Level 5 (Walks with assist in room/hall) - Balance while stepping forward/back and can walk in room with assist - Complete Level 2 (Chairfast) - Balance while sitting on edge of bed and cannot stand   Is the above level different from baseline mobility prior to current illness? Yes - Recommend PT order Yes - Recommend PT order -- -- Yes - Recommend PT order    Row Name 11/19/22 0900 11/19/22 0301         Does patient have an order for bedrest or is patient medically unstable No - Continue assessment No - Continue assessment      What is the highest level of mobility based on the progressive mobility assessment? -- Level 2 (Chairfast) - Balance while sitting on edge of bed and cannot stand      Is the above level different from baseline mobility prior to current illness? -- Yes - Recommend PT order                     DVT prophylaxis: Subcu heparin Code Status: Full Family Communication:  Status is: Inpatient    Dispo: The patient is from: Home               Anticipated d/c is to: Home              Anticipated d/c date is: > 3 days              Patient currently is not medically stable to d/c.      Consultants:  Cardiology Dr. Laurance Flatten   Procedures/Significant Events:  4/12 CT head W0 contrast -No acute intracranial findings are seen.  4/13 MRI brain W/W0 contrast - Negative for an acute infarct. No hemorrhage. No hydrocephalus 4/14 echocardiogram Left Ventricle: LVEF= 25 to 30%. The left ventricle has severely decreased function. -Grade II diastolic dysfunction (pseudonormalization).  -Right Ventricle:  Right ventricular systolic function is severely reduced. estimated right ventricular systolic pressure is  31.8 mmHg.  -Pericardium: A moderately sized pericardial effusion is present. There is  no evidence of cardiac tamponade.   Mitral Valve: Mild to moderate mitral valve regurgitation.   Tricuspid Valve: The tricuspid valve is normal in structure. Tricuspid  valve regurgitation is mild.   Aortic Valve: The aortic valve is tricuspid. There is mild calcification  of the aortic valve. There is mild thickening of the aortic valve. Aortic  valve regurgitation is not visualized. Aortic valve  sclerosis/calcification is present, without any  evidence of aortic stenosis. Aortic valve peak gradient measures 4.2 mmHg.        I have personally reviewed and interpreted all radiology studies and my findings are as above.  VENTILATOR SETTINGS:    Cultures   Antimicrobials:    Devices    LINES / TUBES:      Continuous Infusions:  heparin 1,000 Units/hr (11/21/22 1011)     Objective: Vitals:   11/21/22 0000 11/21/22 0408 11/21/22 0818 11/21/22 1116  BP: (!) 112/92 113/82 117/78 106/83  Pulse: 87 88 90 100  Resp: 17 17 20 20   Temp: (!) 97.5 F (36.4 C) (!) 97.5 F (36.4 C) 98.3 F (36.8 C) 97.6 F (36.4 C)  TempSrc: Oral Oral Oral Oral  SpO2: 97% 98% 100% 97%  Weight:      Height:         Intake/Output Summary (Last 24 hours) at 11/21/2022 1329 Last data filed at 11/21/2022 0600 Gross per 24 hour  Intake 580 ml  Output 185 ml  Net 395 ml    Filed Weights   11/18/22 1911 11/19/22 0245  Weight: 63.5 kg 60 kg   Physical Exam:  General: A/O x 4, No acute respiratory distress, cachectic Eyes: negative scleral hemorrhage, negative anisocoria, negative icterus ENT: Negative Runny nose, negative gingival bleeding, Neck:  Negative scars, masses, torticollis, lymphadenopathy, JVD Lungs: Clear to auscultation bilaterally without wheezes or crackles Cardiovascular: Regular rate and rhythm without murmur gallop or rub normal S1 and S2 Abdomen: negative abdominal pain, nondistended, positive soft, bowel sounds, no rebound, no ascites, no appreciable mass Extremities: No significant cyanosis, clubbing, or edema bilateral lower extremities Skin: Negative rashes, lesions, ulcers Psychiatric:  Negative depression, positive anxiety, negative fatigue, negative mania, tearful Central nervous system:  Cranial nerves II through XII intact, tongue/uvula midline, all extremities muscle strength 5/5, sensation intact throughout, negative dysarthria, negative expressive aphasia, negative receptive aphasia.    .     Data Reviewed: Care during the described time interval was provided by me .  I have reviewed this patient's available data, including medical history, events of note, physical examination, and all test results as part of my evaluation.  CBC: Recent Labs  Lab 11/18/22 1927 11/19/22 0347 11/19/22 0942 11/20/22 0317 11/21/22 0351  WBC 6.9 5.7 6.4 9.9 6.9  NEUTROABS  --  3.2 4.2 7.1 4.1  HGB 14.5 12.4 12.8 13.3 11.2*  HCT 41.6 35.4* 38.0 39.6 35.1*  MCV 98.8 98.9 101.9* 105.0* 107.0*  PLT 156 142* 135* 150 109*    Basic Metabolic Panel: Recent Labs  Lab 11/18/22 1927 11/19/22 0345 11/19/22 0941 11/19/22 0942 11/19/22 1510 11/19/22 1910 11/20/22 0317  11/20/22 1651 11/21/22 0351  NA 143 144  --  145  --   --  140  --  139  K <2.0* <2.0*  --  2.1* 2.4* 2.7* 3.9 4.2 4.2  CL 99 110  --  105  --   --  108  --  105  CO2 29 27  --  24  --   --  20*  --  21*  GLUCOSE 101* 104*  --  101*  --   --  162*  --  136*  BUN 11 9  --  9  --   --  11  --  14  CREATININE 0.75 0.84  --  0.75  --   --  1.32*  --  1.49*  CALCIUM 8.3* 7.1*  --  7.9*  --   --  7.6*  --  8.3*  MG  --  1.2* 2.9* 2.9*  --   --  2.8*  --  2.0  PHOS  --   --   --  3.5  --   --  4.3  --  4.5    GFR: Estimated Creatinine Clearance: 28.2 mL/min (A) (by C-G formula based on SCr of 1.49 mg/dL (H)). Liver Function Tests: Recent Labs  Lab 11/18/22 1927 11/19/22 0345 11/19/22 0942 11/20/22 0317 11/21/22 0351  AST 41 61* 48* 75* 108*  ALT 42 88*  ALKPHOS 94 80 88 95 78  BILITOT 1.4* 1.3* 1.1 1.9* 1.7*  PROT 6.9 5.1* 5.5* 5.6* 5.5*  ALBUMIN 3.7 2.8* 3.0* 2.9* 2.8*    No results for input(s): "LIPASE", "AMYLASE" in the last 168 hours. No results for input(s): "AMMONIA" in the last 168 hours. Coagulation Profile: Recent Labs  Lab 11/21/22 0938  INR 1.2   Cardiac Enzymes: No results for input(s): "CKTOTAL", "CKMB", "CKMBINDEX", "TROPONINI" in the last 168 hours. BNP (last 3 results) No results for input(s): "PROBNP" in the last 8760 hours. HbA1C: No results for input(s): "HGBA1C" in the last 72 hours. CBG: Recent Labs  Lab 11/18/22 1942  GLUCAP 122*    Lipid Profile: No results for input(s): "CHOL", "HDL", "LDLCALC", "TRIG", "CHOLHDL", "LDLDIRECT" in the last 72 hours. Thyroid Function Tests: Recent Labs    11/19/22 0345 11/19/22 0941  TSH 6.980*  --   FREET4  --  0.71    Anemia Panel: No results for input(s): "VITAMINB12", "FOLATE", "FERRITIN", "TIBC", "IRON", "RETICCTPCT" in the last 72 hours. Sepsis Labs: No results for input(s): "PROCALCITON", "LATICACIDVEN" in the last 168 hours.  No results found for this or any previous visit (from the  past 240 hour(s)).       Radiology Studies: DG CHEST PORT 1 VIEW  Result Date: 11/21/2022 CLINICAL DATA:  Weakness.  Foot swelling. EXAM: PORTABLE CHEST 1 VIEW COMPARISON:  Radiographs 11/19/2022 and 07/30/2016.  CT 07/29/2016. FINDINGS: 0950 hours. Stable cardiomegaly, vascular congestion and bilateral pleural effusions. There is associated dependent atelectasis at both lung bases. No pneumothorax. The bones appear unchanged. Telemetry leads overlie the chest. IMPRESSION: Cardiomegaly, vascular congestion and bilateral pleural effusions consistent with congestive heart failure. No significant change from previous study of 2 days prior. Electronically Signed   By: Carey Bullocks M.D.   On: 11/21/2022 10:16   ECHOCARDIOGRAM COMPLETE  Result Date: 11/20/2022    ECHOCARDIOGRAM REPORT   Patient Name:   Kathleen Velazquez Date of Exam: 11/20/2022 Medical Rec #:  [id number]       Height:       62.0 in Accession #:    [id number]      Weight:       132.3 lb Date of Birth:  13-Aug-1955       BSA:          1.604 m Patient Age:    61 years  BP:           88/63 mmHg Patient Gender: F               HR:           80 bpm. Exam Location:  Inpatient Procedure: 2D Echo, Cardiac Doppler and Color Doppler Indications:    Acute respiratory distress R06.03  History:        Patient has no prior history of Echocardiogram examinations.                 Arrythmias:Tachycardia; Risk Factors:Dyslipidemia.  Sonographer:    Lucendia Herrlich Referring Phys: [id number] AMRIT ADHIKARI IMPRESSIONS  1. Very difficult study due to poor echo windows and lack of visualization of LV endocardium. However, based on limited views, there is severe biventricular failure with estimated LVEF ~25% (endocardium very poorly visualized) and severe RV dysfunction.  2. Left ventricular ejection fraction, by estimation, is 25 to 30%. The left ventricle has severely decreased function. Left ventricular endocardial border not optimally defined to evaluate  regional wall motion. There is mild left ventricular hypertrophy. Left ventricular diastolic parameters are consistent with Grade II diastolic dysfunction (pseudonormalization).  3. Right ventricular systolic function is severely reduced. The right ventricular size is mildly enlarged.  4. Left atrial size was mildly dilated.  5. The mitral valve is degenerative. Mild to moderate mitral valve regurgitation. Moderate mitral annular calcification.  6. The aortic valve is tricuspid. There is mild calcification of the aortic valve. There is mild thickening of the aortic valve. Aortic valve regurgitation is not visualized. Aortic valve sclerosis/calcification is present, without any evidence of aortic stenosis.  7. The inferior vena cava is dilated in size with <50% respiratory variability, suggesting right atrial pressure of 15 mmHg.  8. Moderate pericardial effusion. There is no evidence of cardiac tamponade. Comparison(s): No prior Echocardiogram. FINDINGS  Left Ventricle: Left ventricular ejection fraction, by estimation, is 25 to 30%. The left ventricle has severely decreased function. Left ventricular endocardial border not optimally defined to evaluate regional wall motion. The left ventricular internal cavity size was normal in size. There is mild left ventricular hypertrophy. Left ventricular diastolic parameters are consistent with Grade II diastolic dysfunction (pseudonormalization). Right Ventricle: The right ventricular size is mildly enlarged. Right vetricular wall thickness was not well visualized. Right ventricular systolic function is severely reduced. The tricuspid regurgitant velocity is 2.05 m/s, and with an assumed right atrial pressure of 15 mmHg, the estimated right ventricular systolic pressure is 31.8 mmHg. Left Atrium: Left atrial size was mildly dilated. Right Atrium: Right atrial size was normal in size. Pericardium: A moderately sized pericardial effusion is present. There is no evidence of  cardiac tamponade. Mitral Valve: The mitral valve is degenerative in appearance. Moderate mitral annular calcification. Mild to moderate mitral valve regurgitation. Tricuspid Valve: The tricuspid valve is normal in structure. Tricuspid valve regurgitation is mild. Aortic Valve: The aortic valve is tricuspid. There is mild calcification of the aortic valve. There is mild thickening of the aortic valve. Aortic valve regurgitation is not visualized. Aortic valve sclerosis/calcification is present, without any evidence of aortic stenosis. Aortic valve peak gradient measures 4.2 mmHg. Pulmonic Valve: The pulmonic valve was normal in structure. Pulmonic valve regurgitation is trivial. Aorta: The aortic root and ascending aorta are structurally normal, with no evidence of dilitation. Venous: The inferior vena cava is dilated in size with less than 50% respiratory variability, suggesting right atrial pressure of 15 mmHg. IAS/Shunts: The atrial septum is grossly  normal.  LEFT VENTRICLE PLAX 2D LVIDd:         4.30 cm   Diastology LVIDs:         3.00 cm   LV e' lateral:   6.18 cm/s LV PW:         1.10 cm   LV E/e' lateral: 12.4 LV IVS:        1.20 cm LVOT diam:     2.00 cm LV SV:         32 LV SV Index:   20 LVOT Area:     3.14 cm  LEFT ATRIUM           Index        RIGHT ATRIUM           Index LA diam:      4.20 cm 2.62 cm/m   RA Area:     14.20 cm LA Vol (A4C): 60.9 ml 37.98 ml/m  RA Volume:   34.50 ml  21.51 ml/m  AORTIC VALVE AV Area (Vmax): 1.94 cm AV Vmax:        102.00 cm/s AV Peak Grad:   4.2 mmHg LVOT Vmax:      62.93 cm/s LVOT Vmean:     39.400 cm/s LVOT VTI:       0.103 m  AORTA Ao Root diam: 3.00 cm Ao Asc diam:  3.20 cm MITRAL VALVE               TRICUSPID VALVE MV Area (PHT): 6.96 cm    TR Peak grad:   16.8 mmHg MV Decel Time: 109 msec    TR Vmax:        205.00 cm/s MV E velocity: 76.70 cm/s MV A velocity: 40.70 cm/s  SHUNTS MV E/A ratio:  1.88        Systemic VTI:  0.10 m                             Systemic Diam: 2.00 cm Laurance Flatten MD Electronically signed by Laurance Flatten MD Signature Date/Time: 11/20/2022/3:28:17 PM    Final         Scheduled Meds:  gemfibrozil  600 mg Oral BID AC   melatonin  3 mg Oral QHS   scopolamine  1 patch Transdermal Q72H   spironolactone  12.5 mg Oral Daily   Continuous Infusions:  heparin 1,000 Units/hr (11/21/22 1011)     LOS: 2 days    Time spent:40 min    Brendy Ficek, Roselind Messier, MD [facility]   If 7PM-7AM, please contact night-coverage 11/21/2022, 1:29 PM

## 2022-11-21 NOTE — Progress Notes (Signed)
Rounding Note    Patient Name: Kathleen Velazquez Date of Encounter: 11/21/2022  [facility] Cardiologist: None   Subjective   Patient appears comfortable in bed this morning. Reports improvement in respiratory status, no longer requiring O2 support. She denies chest pain, palpitations. Patient continues to express frustration at being "left in the hallway" following her MRI. She is adamantly opposed to further cardiac evaluation.   Inpatient Medications    Scheduled Meds:  gemfibrozil  600 mg Oral BID AC   heparin  5,000 Units Subcutaneous Q8H   melatonin  3 mg Oral QHS   scopolamine  1 patch Transdermal Q72H   spironolactone  12.5 mg Oral Daily   Continuous Infusions:  PRN Meds: acetaminophen **OR** acetaminophen, loperamide, oxyCODONE   Vital Signs    Vitals:   11/20/22 2025 11/21/22 0000 11/21/22 0408 11/21/22 0818  BP: 105/87 (!) 112/92 113/82 117/78  Pulse: 89 87 88 90  Resp: 18 17 17 20   Temp: 97.8 F (36.6 C) (!) 97.5 F (36.4 C) (!) 97.5 F (36.4 C) 98.3 F (36.8 C)  TempSrc: Oral Oral Oral Oral  SpO2: 96% 97% 98% 100%  Weight:      Height:        Intake/Output Summary (Last 24 hours) at 11/21/2022 0904 Last data filed at 11/21/2022 0600 Gross per 24 hour  Intake 580 ml  Output 185 ml  Net 395 ml      11/19/2022    2:45 AM 11/18/2022    7:11 PM 02/26/2020   11:52 AM  Last 3 Weights  Weight (lbs) 132 lb 4.4 oz 139 lb 15.9 oz 139 lb 15.9 oz  Weight (kg) 60 kg 63.5 kg 63.5 kg      Telemetry    Sinus tachycardia with frequent PVCs - Personally Reviewed  ECG    No new tracing - Personally Reviewed  Physical Exam   GEN: No acute distress.   Neck: JVD not appreciably elevated on physical exam Cardiac: RRR, quiet systolic murmur Respiratory: bilateral bases diminished. Poor inspiratory effort with frequent coughing limited exam. GI: Soft, nontender, non-distended  MS: No edema; No deformity. Bilateral feet are cool to touch. Neuro:   Nonfocal  Psych: Normal affect   Labs    High Sensitivity Troponin:  No results for input(s): "TROPONINIHS" in the last 720 hours.   Chemistry Recent Labs  Lab 11/19/22 0942 11/19/22 1510 11/20/22 0317 11/20/22 1651 11/21/22 0351  NA 145  --  140  --  139  K 2.1*   < > 3.9 4.2 4.2  CL 105  --  108  --  105  CO2 24  --  20*  --  21*  GLUCOSE 101*  --  162*  --  136*  BUN 9  --  11  --  14  CREATININE 0.75  --  1.32*  --  1.49*  CALCIUM 7.9*  --  7.6*  --  8.3*  MG 2.9*  --  2.8*  --  2.0  PROT 5.5*  --  5.6*  --  5.5*  ALBUMIN 3.0*  --  2.9*  --  2.8*  AST 48*  --  75*  --  108*  ALT 27  --  42  --  88*  ALKPHOS 88  --  95  --  78  BILITOT 1.1  --  1.9*  --  1.7*  GFRNONAA >60  --  44*  --  38*  ANIONGAP 16*  --  12  --  13   < > = values in this interval not displayed.    Lipids No results for input(s): "CHOL", "TRIG", "HDL", "LABVLDL", "LDLCALC", "CHOLHDL" in the last 168 hours.  Hematology Recent Labs  Lab 11/19/22 0942 11/20/22 0317 11/21/22 0351  WBC 6.4 9.9 6.9  RBC 3.73* 3.77* 3.28*  HGB 12.8 13.3 11.2*  HCT 38.0 39.6 35.1*  MCV 101.9* 105.0* 107.0*  MCH 34.3* 35.3* 34.1*  MCHC 33.7 33.6 31.9  RDW 14.6 14.7 14.5  PLT 135* 150 109*   Thyroid  Recent Labs  Lab 11/19/22 0345 11/19/22 0941  TSH 6.980*  --   FREET4  --  0.71    BNP Recent Labs  Lab 11/20/22 1651  BNP 1,370.1*    DDimer  Recent Labs  Lab 11/20/22 1651  DDIMER 2.94*     Radiology    ECHOCARDIOGRAM COMPLETE  Result Date: 11/20/2022    ECHOCARDIOGRAM REPORT   Patient Name:   Kathleen Velazquez Date of Exam: 11/20/2022 Medical Rec #:  [id number]       Height:       62.0 in Accession #:    [id number]      Weight:       132.3 lb Date of Birth:  [birth year]       BSA:          1.604 m Patient Age:    67 years        BP:           88/63 mmHg Patient Gender: F               HR:           80 bpm. Exam Location:  Inpatient Procedure: 2D Echo, Cardiac Doppler and Color Doppler Indications:     Acute respiratory distress R06.03  History:        Patient has no prior history of Echocardiogram examinations.                 Arrythmias:Tachycardia; Risk Factors:Dyslipidemia.  Sonographer:    Lucendia Herrlich Referring Phys: [id number] AMRIT ADHIKARI IMPRESSIONS  1. Very difficult study due to poor echo windows and lack of visualization of LV endocardium. However, based on limited views, there is severe biventricular failure with estimated LVEF ~25% (endocardium very poorly visualized) and severe RV dysfunction.  2. Left ventricular ejection fraction, by estimation, is 25 to 30%. The left ventricle has severely decreased function. Left ventricular endocardial border not optimally defined to evaluate regional wall motion. There is mild left ventricular hypertrophy. Left ventricular diastolic parameters are consistent with Grade II diastolic dysfunction (pseudonormalization).  3. Right ventricular systolic function is severely reduced. The right ventricular size is mildly enlarged.  4. Left atrial size was mildly dilated.  5. The mitral valve is degenerative. Mild to moderate mitral valve regurgitation. Moderate mitral annular calcification.  6. The aortic valve is tricuspid. There is mild calcification of the aortic valve. There is mild thickening of the aortic valve. Aortic valve regurgitation is not visualized. Aortic valve sclerosis/calcification is present, without any evidence of aortic stenosis.  7. The inferior vena cava is dilated in size with <50% respiratory variability, suggesting right atrial pressure of 15 mmHg.  8. Moderate pericardial effusion. There is no evidence of cardiac tamponade. Comparison(s): No prior Echocardiogram. FINDINGS  Left Ventricle: Left ventricular ejection fraction, by estimation, is 25 to 30%. The left ventricle has severely decreased function. Left ventricular endocardial border not optimally defined to  evaluate regional wall motion. The left ventricular internal cavity size was  normal in size. There is mild left ventricular hypertrophy. Left ventricular diastolic parameters are consistent with Grade II diastolic dysfunction (pseudonormalization). Right Ventricle: The right ventricular size is mildly enlarged. Right vetricular wall thickness was not well visualized. Right ventricular systolic function is severely reduced. The tricuspid regurgitant velocity is 2.05 m/s, and with an assumed right atrial pressure of 15 mmHg, the estimated right ventricular systolic pressure is 31.8 mmHg. Left Atrium: Left atrial size was mildly dilated. Right Atrium: Right atrial size was normal in size. Pericardium: A moderately sized pericardial effusion is present. There is no evidence of cardiac tamponade. Mitral Valve: The mitral valve is degenerative in appearance. Moderate mitral annular calcification. Mild to moderate mitral valve regurgitation. Tricuspid Valve: The tricuspid valve is normal in structure. Tricuspid valve regurgitation is mild. Aortic Valve: The aortic valve is tricuspid. There is mild calcification of the aortic valve. There is mild thickening of the aortic valve. Aortic valve regurgitation is not visualized. Aortic valve sclerosis/calcification is present, without any evidence of aortic stenosis. Aortic valve peak gradient measures 4.2 mmHg. Pulmonic Valve: The pulmonic valve was normal in structure. Pulmonic valve regurgitation is trivial. Aorta: The aortic root and ascending aorta are structurally normal, with no evidence of dilitation. Venous: The inferior vena cava is dilated in size with less than 50% respiratory variability, suggesting right atrial pressure of 15 mmHg. IAS/Shunts: The atrial septum is grossly normal.  LEFT VENTRICLE PLAX 2D LVIDd:         4.30 cm   Diastology LVIDs:         3.00 cm   LV e' lateral:   6.18 cm/s LV PW:         1.10 cm   LV E/e' lateral: 12.4 LV IVS:        1.20 cm LVOT diam:     2.00 cm LV SV:         32 LV SV Index:   20 LVOT Area:     3.14 cm   LEFT ATRIUM           Index        RIGHT ATRIUM           Index LA diam:      4.20 cm 2.62 cm/m   RA Area:     14.20 cm LA Vol (A4C): 60.9 ml 37.98 ml/m  RA Volume:   34.50 ml  21.51 ml/m  AORTIC VALVE AV Area (Vmax): 1.94 cm AV Vmax:        102.00 cm/s AV Peak Grad:   4.2 mmHg LVOT Vmax:      62.93 cm/s LVOT Vmean:     39.400 cm/s LVOT VTI:       0.103 m  AORTA Ao Root diam: 3.00 cm Ao Asc diam:  3.20 cm MITRAL VALVE               TRICUSPID VALVE MV Area (PHT): 6.96 cm    TR Peak grad:   16.8 mmHg MV Decel Time: 109 msec    TR Vmax:        205.00 cm/s MV E velocity: 76.70 cm/s MV A velocity: 40.70 cm/s  SHUNTS MV E/A ratio:  1.88        Systemic VTI:  0.10 m  Systemic Diam: 2.00 cm Laurance Flatten MD Electronically signed by Laurance Flatten MD Signature Date/Time: 11/20/2022/3:28:17 PM    Final    DG CHEST PORT 1 VIEW  Result Date: 11/19/2022 CLINICAL DATA:  Shortness of breath. EXAM: PORTABLE CHEST 1 VIEW COMPARISON:  July 30, 2016. FINDINGS: Mild cardiomegaly is noted with central pulmonary vascular congestion. Probable mild bilateral pulmonary edema is noted. Small bilateral pleural effusions are noted. Bony thorax is unremarkable. IMPRESSION: Mild cardiomegaly with central pulmonary vascular congestion and probable bilateral pulmonary edema. Electronically Signed   By: Lupita Raider M.D.   On: 11/19/2022 14:20    Cardiac Studies   TTE 11/20/22: IMPRESSIONS     1. Very difficult study due to poor echo windows and lack of  visualization of LV endocardium. However, based on limited views, there is  severe biventricular failure with estimated LVEF ~25% (endocardium very  poorly visualized) and severe RV dysfunction.   2. Left ventricular ejection fraction, by estimation, is 25 to 30%. The  left ventricle has severely decreased function. Left ventricular  endocardial border not optimally defined to evaluate regional wall motion.  There is mild left  ventricular  hypertrophy. Left ventricular diastolic parameters are consistent with  Grade II diastolic dysfunction (pseudonormalization).   3. Right ventricular systolic function is severely reduced. The right  ventricular size is mildly enlarged.   4. Left atrial size was mildly dilated.   5. The mitral valve is degenerative. Mild to moderate mitral valve  regurgitation. Moderate mitral annular calcification.   6. The aortic valve is tricuspid. There is mild calcification of the  aortic valve. There is mild thickening of the aortic valve. Aortic valve  regurgitation is not visualized. Aortic valve sclerosis/calcification is  present, without any evidence of  aortic stenosis.   7. The inferior vena cava is dilated in size with <50% respiratory  variability, suggesting right atrial pressure of 15 mmHg.   8. Moderate pericardial effusion. There is no evidence of cardiac  tamponade.   Comparison(s): No prior Echocardiogram.   Patient Profile     Kathleen Velazquez is a 67 y.o. female with a hx of anxiety, GERD, HTN, IBS, and sinus tachycardia who is being seen for the evaluation of severe biventricular failure and severe RV dysfunction at the request of Dr. Joseph Art.   Assessment & Plan    Severe Biventricular HF  TTE with LVEF 25-30%, severe RV dysfunction which is newly diagnosed. BNP 1370.1. ECG with NSR, PVCs, TWI in anterolateral leads but these are chronic. Etiology of BiV failure unknown with no history of ischemic heart disease or known cardiomyopathy  Patient s/p 20mg  IV lasix yesterday evening. I/O does not appear accurate. Given worsened creatinine/no obvious dyspnea, would defer further diuretics until PE evaluation completed. Continue to hold beta blocker due to concern for low output state Would ideally like to proceed with LHC/RHC and possible CMR but patient continues to decline further procedures or repeat MRI imaging Patient tolerating spironolactone, elevated creatinine  prevents addition of further GDMT at this time.  Elevated D-Dimer  No known history of PE but D-dimer elevated to 2.94 in setting of severe RV dysfunction.   Start heparin Patient agreeable to CTA chest or V/Q scan this morning. V/Q scan ordered by primary team.   Hypokalemia  Occurred in the setting of diarrhea with K as low as 2.1 which has improved with supplementation and spironolactone. Continue to monitor.   Sinus tachycardia  Continue to hold beta blocker for now  given concern for low output state   Frequent PVCs  Patient continues with frequent PVCs. Would plan to resume beta blocker with clinical improvement.       For questions or updates, please contact [facility] Please consult [email or web address] for contact info under        Signed, Perlie Gold, PA-C  11/21/2022, 9:04 AM

## 2022-11-21 NOTE — Consult Note (Signed)
Palliative Care Consult Note                                  Date: 11/21/2022   Patient Name: Kathleen Velazquez  DOB: 02/13/1956  MRN: [id number]  Age / Sex: 67 y.o., female  PCP: Toma Deiters, MD Referring Physician: Drema Dallas, MD  Reason for Consultation: Establishing goals of care  HPI/Patient Profile: 67 y.o. female  with past medical history of anxiety, GERD, hypertension, sinus tachycardia, IBS with chronic diarrhea who presented to the emergency department with complaint of right-sided weakness, difficulty in ambulation.  Stroke was suspected on admission.  CT head did not show any acute findings. She admitted on 11/18/2022 with right-sided weakness, severe anemia, chronic diarrhea, prolonged QT (likely due to hypokalemia).   Further workup found severe new onset HFrEF with EF 25 to 30%.  PMT was consulted for GOC conversations.  Past Medical History:  Diagnosis Date   Anxiety    Arthritis    knees,    Ejection fraction    Normal, echo, June, 2013   GERD (gastroesophageal reflux disease)    Headache(784.0)    hx of migraines    History of hiatal hernia    Hypertension    Irritable bowel syndrome (IBS)    PONV (postoperative nausea and vomiting)    Sinus tachycardia    Persistent sinus tachycardia, June, 2013 ( prior monitor in 2007 had shown no significant abnormalities.)    Subjective:   This NP Wynne Dust reviewed medical records, received report from team, assessed the patient and then meet at the patient's bedside to discuss diagnosis, prognosis, GOC, EOL wishes disposition and options.  I met with the patient at the bedside. I spoke with the patient's daughter by phone.   Concept of Palliative Care was introduced as specialized medical care for people and their families living with serious illness.  If focuses on providing relief from the symptoms and stress of a serious illness.  The goal is to improve  quality of life for both the patient and the family. Values and goals of care important to patient and family were attempted to be elicited.  Created space and opportunity for patient  and family to explore thoughts and feelings regarding current medical situation   Natural trajectory and current clinical status were discussed. Questions and concerns addressed. Patient  encouraged to call with questions or concerns.    Patient/Family Understanding of Illness: When I met with the patient she initially states that she knows she is here for low potassium.  She could not elaborate on her heart disease.  Had further discussion about her chronic comorbidities and acute presentations.  We noted right-sided weakness likely due in part to hypokalemia secondary to chronic diarrhea.  CT and MRI are negative for stroke which we celebrated.  Noted severe BiV failure.  Had an extensive discussion on the pathophysiology of HFrEF.  She seems to be on board and understanding of her current health problems  Life Review: Patient has 1 daughter Kenney Houseman and 2 grandsons.  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  Things that bring her happiness are primarily her grandchildren.  She used to enjoy shopping before her weakness onset.  She also enjoys watching cooking shows (especially [employer]) as well as home-improvement shows specifically property brothers.  The patient's daughter states that her mom's had a downward  decline over the past 6 months with included swelling and becoming "winded".  We discussed the possibility that heart failure was slowly building over time until it came to ahead when she sought medical treatment.  Patient Values: Family, faith (she actively Production assistant, radio)  Goals: Medical management, no procedures, will decide on MRI imaging case-by-case.  Today's Discussion: In addition to discussions about above we had substantial discussion on various  topics.  She notes that she sees Dr. Bradly Bienenstock for primary care.  She does not like going to the doctor and does not like taking medications.  However, we discussed in the current context with her desire to live for her grandchildren that it would be very important for her to take medications to try to treat her heart failure.  She states her daughter has been "really getting on me about taking my medicines."  She does mention that she wants to stay around as long as she can for her grandchildren.  They seem to be her primary motivator.  I used this to motivate her to continue to see her care providers and take her medications.  We discussed taking each pill "for my grandchildren" which she agrees with.  We talked about her limitations and offered interventions.  She is adamant that she does not want procedures such as heart cath.  Initially she states she does not want any more MRI.  On further discussion it seems to be an issue with not provided a sedative or earplugs, and after the test she was put in the hallway to just sit still somebody cannot get her.  Upon further discussion she states that with offered sedation and ear protection she would consider MRI on a case-by-case basis.  We also discussed her history of IBS diarrhea type as likely rationale for her persistent and profuse diarrhea.  I offered that with a GI consult there are many great medicines that can help with this but she is adamant about not wanting to "go around and see a bunch of other doctors."  She seems content with continued use of Imodium at this time.  However, she is in agreement with seeing cardiology as an outpatient.  She does have strong faith and is actively practicing [employer].  I offered spiritual care support which she declined.  She states her personal preacher has visited her.  We discussed her previous function and appetite.  Previously she was quite independent and enjoyed shopping at [employer] and was able  to care for herself.  Recently with her weakness her function has declined to where she cannot go out to the store.  Regarding appetite she states she "loves to eat" and wonders sometimes "how I managed to fit through the front door".  We discussed nutrition as an important component of overall health and strengthening.  I offered that I would reach out to her daughter for discussion about her current situation which she is in agreement with.  She states she is unsure if her daughter will be able to answer because she is at work at the post office.  I told her I could leave a message for call back.  Her overall goal would be to meet together, with the patient, iron out goals of care and ensure that everybody is on the same page.  She is in agreement.  I provided emotional and general support through therapeutic listening, empathy, sharing of stories, therapeutic touch, and other techniques. I answered all questions and addressed all concerns to  the best of my ability.  Review of Systems  Constitutional:  Negative for fatigue.  Respiratory:  Negative for cough and shortness of breath.   Gastrointestinal:  Positive for diarrhea (consistent with history IBS-D). Negative for abdominal pain and vomiting.  Neurological:  Positive for weakness.    Objective:   Primary Diagnoses: Present on Admission:  Hypokalemia  IBS (irritable bowel syndrome)  Sinus tachycardia   Physical Exam Vitals and nursing note reviewed.  Constitutional:      General: She is not in acute distress.    Appearance: She is ill-appearing.  HENT:     Head: Normocephalic and atraumatic.  Cardiovascular:     Rate and Rhythm: Normal rate.  Pulmonary:     Effort: Pulmonary effort is normal. No respiratory distress.  Abdominal:     General: Abdomen is flat.  Skin:    General: Skin is warm and dry.  Neurological:     General: No focal deficit present.     Mental Status: She is alert.  Psychiatric:        Mood and  Affect: Mood normal.        Behavior: Behavior normal.     Vital Signs:  BP 117/78 (BP Location: Left Arm)   Pulse 90   Temp 98.3 F (36.8 C) (Oral)   Resp 20   Ht  (1.575 m)   Wt 60 kg   SpO2 100%   BMI 24.19 kg/m   Palliative Assessment/Data: 60%    Advanced Care Planning:   Existing Vynca/ACP Documentation: None  Primary Decision Maker: PATIENT  Code Status/Advance Care Planning: Full code  A discussion was had today regarding advanced directives. Concepts specific to code status, artifical feeding and hydration, continued IV antibiotics and rehospitalization was had.  The difference between a aggressive medical intervention path and a palliative comfort care path for this patient at this time was had.   Decisions/Changes to ACP: None today  Assessment & Plan:   Impression: 67 year old female with chronic comorbidities and acute presentations as described above.  We had a significant discussion today.  She is setting limits on her care.  She would like to discuss with her daughter more about CODE STATUS before making a decision.  However, she does not want any procedures.  Initially reluctant for MRI but is agreeable to consider case-by-case with offered sedation and ear protection.  Currently on GDMT by cardiology, hopeful for improvement.  States "I want to live for my grandchildren".  Long-term prognosis guarded to poor.  SUMMARY OF RECOMMENDATIONS   Remain full code Continue current scope of care Discussed MRI and other imaging case-by-case Recommend offering ear protection and sedation for MRI Ongoing GOC conversations as clinical picture evolves We will reach out to the patient's daughter to discuss and hopefully schedule family meeting PMT will continue to follow  Symptom Management:  Per primary team PMT is available to assist as needed  Prognosis:  Unable to determine  Discharge Planning:  To Be Determined   Discussed with: Patient,  patient's family, medical team, nursing team    Thank you for allowing [place] to participate in the care of LICET DUNPHY PMT will continue to support holistically.  Time Total: 95 min  Greater than 50%  of this time was spent counseling and coordinating care related to the above assessment and plan.  Signed by: Wynne Dust, NP Palliative Medicine Team  Team Phone # [phone] (Nights/Weekends)  11/21/2022, 10:54 AM

## 2022-11-22 ENCOUNTER — Inpatient Hospital Stay (HOSPITAL_COMMUNITY)
Admission: EM | Disposition: A | Discharge status: Home / Self Care | Payer: Self-pay | Source: Home / Self Care | Attending: Internal Medicine

## 2022-11-22 DIAGNOSIS — E876 Hypokalemia: Secondary | ICD-10-CM | POA: Diagnosis not present

## 2022-11-22 DIAGNOSIS — R531 Weakness: Secondary | ICD-10-CM | POA: Diagnosis not present

## 2022-11-22 DIAGNOSIS — I493 Ventricular premature depolarization: Secondary | ICD-10-CM | POA: Diagnosis not present

## 2022-11-22 DIAGNOSIS — I50810 Right heart failure, unspecified: Secondary | ICD-10-CM | POA: Diagnosis not present

## 2022-11-22 DIAGNOSIS — I5021 Acute systolic (congestive) heart failure: Secondary | ICD-10-CM | POA: Diagnosis not present

## 2022-11-22 DIAGNOSIS — Z7189 Other specified counseling: Secondary | ICD-10-CM | POA: Diagnosis not present

## 2022-11-22 DIAGNOSIS — I5041 Acute combined systolic (congestive) and diastolic (congestive) heart failure: Secondary | ICD-10-CM | POA: Diagnosis not present

## 2022-11-22 DIAGNOSIS — Z515 Encounter for palliative care: Secondary | ICD-10-CM | POA: Diagnosis not present

## 2022-11-22 HISTORY — PX: RIGHT/LEFT HEART CATH AND CORONARY ANGIOGRAPHY: CATH118266

## 2022-11-22 LAB — POCT I-STAT 7, (LYTES, BLD GAS, ICA,H+H)
Acid-base deficit: 3 mmol/L — ABNORMAL HIGH (ref 0.0–2.0)
Bicarbonate: 19.6 mmol/L — ABNORMAL LOW (ref 20.0–28.0)
Calcium, Ion: 1.08 mmol/L — ABNORMAL LOW (ref 1.15–1.40)
HCT: 32 % — ABNORMAL LOW (ref 36.0–46.0)
Hemoglobin: 10.9 g/dL — ABNORMAL LOW (ref 12.0–15.0)
O2 Saturation: 98 %
Potassium: 4.5 mmol/L (ref 3.5–5.1)
Sodium: 140 mmol/L (ref 135–145)
TCO2: 20 mmol/L — ABNORMAL LOW (ref 22–32)
pCO2 arterial: 28.2 mmHg — ABNORMAL LOW (ref 32–48)
pH, Arterial: 7.45 (ref 7.35–7.45)
pO2, Arterial: 93 mmHg (ref 83–108)

## 2022-11-22 LAB — PHOSPHORUS: Phosphorus: 3.8 mg/dL (ref 2.5–4.6)

## 2022-11-22 LAB — POCT I-STAT EG7
Acid-base deficit: 1 mmol/L (ref 0.0–2.0)
Acid-base deficit: 1 mmol/L (ref 0.0–2.0)
Bicarbonate: 22.4 mmol/L (ref 20.0–28.0)
Bicarbonate: 23.1 mmol/L (ref 20.0–28.0)
Calcium, Ion: 1.14 mmol/L — ABNORMAL LOW (ref 1.15–1.40)
Calcium, Ion: 1.17 mmol/L (ref 1.15–1.40)
HCT: 34 % — ABNORMAL LOW (ref 36.0–46.0)
HCT: 35 % — ABNORMAL LOW (ref 36.0–46.0)
Hemoglobin: 11.6 g/dL — ABNORMAL LOW (ref 12.0–15.0)
Hemoglobin: 11.9 g/dL — ABNORMAL LOW (ref 12.0–15.0)
O2 Saturation: 62 %
O2 Saturation: 67 %
Potassium: 4.7 mmol/L (ref 3.5–5.1)
Potassium: 4.7 mmol/L (ref 3.5–5.1)
Sodium: 140 mmol/L (ref 135–145)
Sodium: 140 mmol/L (ref 135–145)
TCO2: 23 mmol/L (ref 22–32)
TCO2: 24 mmol/L (ref 22–32)
pCO2, Ven: 33.7 mmHg — ABNORMAL LOW (ref 44–60)
pCO2, Ven: 34.5 mmHg — ABNORMAL LOW (ref 44–60)
pH, Ven: 7.431 — ABNORMAL HIGH (ref 7.25–7.43)
pH, Ven: 7.433 — ABNORMAL HIGH (ref 7.25–7.43)
pO2, Ven: 31 mmHg — CL (ref 32–45)
pO2, Ven: 34 mmHg (ref 32–45)

## 2022-11-22 LAB — CBC WITH DIFFERENTIAL/PLATELET
Abs Immature Granulocytes: 0.02 10*3/uL (ref 0.00–0.07)
Basophils Absolute: 0 10*3/uL (ref 0.0–0.1)
Basophils Relative: 0 %
Eosinophils Absolute: 0 10*3/uL (ref 0.0–0.5)
Eosinophils Relative: 0 %
HCT: 35.4 % — ABNORMAL LOW (ref 36.0–46.0)
Hemoglobin: 11.5 g/dL — ABNORMAL LOW (ref 12.0–15.0)
Immature Granulocytes: 0 %
Lymphocytes Relative: 27 %
Lymphs Abs: 1.5 10*3/uL (ref 0.7–4.0)
MCH: 34.2 pg — ABNORMAL HIGH (ref 26.0–34.0)
MCHC: 32.5 g/dL (ref 30.0–36.0)
MCV: 105.4 fL — ABNORMAL HIGH (ref 80.0–100.0)
Monocytes Absolute: 0.5 10*3/uL (ref 0.1–1.0)
Monocytes Relative: 9 %
Neutro Abs: 3.4 10*3/uL (ref 1.7–7.7)
Neutrophils Relative %: 64 %
Platelets: 117 10*3/uL — ABNORMAL LOW (ref 150–400)
RBC: 3.36 MIL/uL — ABNORMAL LOW (ref 3.87–5.11)
RDW: 14.3 % (ref 11.5–15.5)
WBC: 5.4 10*3/uL (ref 4.0–10.5)
nRBC: 0 % (ref 0.0–0.2)

## 2022-11-22 LAB — COMPREHENSIVE METABOLIC PANEL
ALT: 77 U/L — ABNORMAL HIGH (ref 0–44)
AST: 63 U/L — ABNORMAL HIGH (ref 15–41)
Albumin: 2.9 g/dL — ABNORMAL LOW (ref 3.5–5.0)
Alkaline Phosphatase: 92 U/L (ref 38–126)
Anion gap: 11 (ref 5–15)
BUN: 12 mg/dL (ref 8–23)
CO2: 19 mmol/L — ABNORMAL LOW (ref 22–32)
Calcium: 8.3 mg/dL — ABNORMAL LOW (ref 8.9–10.3)
Chloride: 106 mmol/L (ref 98–111)
Creatinine, Ser: 1.16 mg/dL — ABNORMAL HIGH (ref 0.44–1.00)
GFR, Estimated: 51 mL/min — ABNORMAL LOW (ref >60)
Glucose, Bld: 112 mg/dL — ABNORMAL HIGH (ref 70–99)
Potassium: 3.5 mmol/L (ref 3.5–5.1)
Sodium: 136 mmol/L (ref 135–145)
Total Bilirubin: 1.3 mg/dL — ABNORMAL HIGH (ref 0.3–1.2)
Total Protein: 5.6 g/dL — ABNORMAL LOW (ref 6.5–8.1)

## 2022-11-22 LAB — MAGNESIUM: Magnesium: 2.1 mg/dL (ref 1.7–2.4)

## 2022-11-22 SURGERY — RIGHT/LEFT HEART CATH AND CORONARY ANGIOGRAPHY
Anesthesia: LOCAL

## 2022-11-22 MED ORDER — LABETALOL HCL 5 MG/ML IV SOLN: 10.0000 mg | INTRAVENOUS | Status: AC | PRN

## 2022-11-22 MED ORDER — LIDOCAINE HCL (PF) 1 % IJ SOLN
INTRAMUSCULAR | Status: DC | PRN
  Administered 2022-11-22: 5 mL
  Administered 2022-11-22: 10 mL
  Administered 2022-11-22: 5 mL

## 2022-11-22 MED ORDER — SODIUM CHLORIDE 0.9 % IV SOLN: 250.0000 mL | INTRAVENOUS | Status: DC | PRN

## 2022-11-22 MED ORDER — SODIUM CHLORIDE 0.9 % IV SOLN: INTRAVENOUS | Status: DC

## 2022-11-22 MED ORDER — POTASSIUM CHLORIDE CRYS ER 20 MEQ PO TBCR
40.0000 meq | EXTENDED_RELEASE_TABLET | Freq: Once | ORAL | Status: AC
  Administered 2022-11-22: 40 meq via ORAL
  Filled 2022-11-22: qty 2

## 2022-11-22 MED ORDER — HEPARIN (PORCINE) IN NACL 1000-0.9 UT/500ML-% IV SOLN
INTRAVENOUS | Status: DC | PRN
  Administered 2022-11-22 (×2): 500 mL

## 2022-11-22 MED ORDER — SODIUM CHLORIDE 0.9% FLUSH
3.0000 mL | Freq: Two times a day (BID) | INTRAVENOUS | Status: DC
  Administered 2022-11-22 – 2022-11-27 (×10): 3 mL via INTRAVENOUS

## 2022-11-22 MED ORDER — VERAPAMIL HCL 2.5 MG/ML IV SOLN
INTRAVENOUS | Status: AC
  Filled 2022-11-22: qty 2

## 2022-11-22 MED ORDER — FUROSEMIDE 10 MG/ML IJ SOLN
40.0000 mg | Freq: Two times a day (BID) | INTRAMUSCULAR | Status: DC
  Administered 2022-11-22 – 2022-11-24 (×4): 40 mg via INTRAVENOUS
  Filled 2022-11-22 (×4): qty 4

## 2022-11-22 MED ORDER — HYDRALAZINE HCL 20 MG/ML IJ SOLN: 10.0000 mg | INTRAMUSCULAR | Status: AC | PRN

## 2022-11-22 MED ORDER — SODIUM CHLORIDE 0.9% FLUSH: 3.0000 mL | INTRAVENOUS | Status: DC | PRN

## 2022-11-22 MED ORDER — FUROSEMIDE 10 MG/ML IJ SOLN
40.0000 mg | Freq: Once | INTRAMUSCULAR | Status: AC
  Administered 2022-11-22: 40 mg via INTRAVENOUS
  Filled 2022-11-22: qty 4

## 2022-11-22 MED ORDER — MIDAZOLAM HCL 2 MG/2ML IJ SOLN
INTRAMUSCULAR | Status: DC | PRN
  Administered 2022-11-22: .5 mg via INTRAVENOUS
  Administered 2022-11-22: 1 mg via INTRAVENOUS
  Administered 2022-11-22: .5 mg via INTRAVENOUS

## 2022-11-22 MED ORDER — HEPARIN SODIUM (PORCINE) 1000 UNIT/ML IJ SOLN
INTRAMUSCULAR | Status: AC
  Filled 2022-11-22: qty 10

## 2022-11-22 MED ORDER — MIDAZOLAM HCL 2 MG/2ML IJ SOLN
INTRAMUSCULAR | Status: AC
  Filled 2022-11-22: qty 2

## 2022-11-22 MED ORDER — FENTANYL CITRATE (PF) 100 MCG/2ML IJ SOLN
INTRAMUSCULAR | Status: DC | PRN
  Administered 2022-11-22 (×2): 25 ug via INTRAVENOUS
  Administered 2022-11-22: 12.5 ug via INTRAVENOUS

## 2022-11-22 MED ORDER — FENTANYL CITRATE (PF) 100 MCG/2ML IJ SOLN
INTRAMUSCULAR | Status: AC
  Filled 2022-11-22: qty 2

## 2022-11-22 MED ORDER — LIDOCAINE HCL (PF) 1 % IJ SOLN
INTRAMUSCULAR | Status: AC
  Filled 2022-11-22: qty 30

## 2022-11-22 MED ORDER — ASPIRIN 81 MG PO CHEW: 81.0000 mg | CHEWABLE_TABLET | ORAL | Status: DC

## 2022-11-22 MED ORDER — ASPIRIN 81 MG PO CHEW
81.0000 mg | CHEWABLE_TABLET | ORAL | Status: AC
  Administered 2022-11-22: 81 mg via ORAL
  Filled 2022-11-22: qty 1

## 2022-11-22 SURGICAL SUPPLY — 18 items
CATH INFINITI 5FR MULTPACK ANG (CATHETERS) IMPLANT
CATH INFINITI 6F ANG MULTIPACK (CATHETERS) IMPLANT
CATH SWAN GANZ 7F STRAIGHT (CATHETERS) IMPLANT
CLOSURE MYNX CONTROL 5F (Vascular Products) IMPLANT
GLIDESHEATH SLEND SS 6F .021 (SHEATH) IMPLANT
GUIDEWIRE INQWIRE 1.5J.035X260 (WIRE) IMPLANT
INQWIRE 1.5J .035X260CM (WIRE) ×1
KIT HEART LEFT (KITS) ×1 IMPLANT
KIT MICROPUNCTURE NIT STIFF (SHEATH) IMPLANT
PACK CARDIAC CATHETERIZATION (CUSTOM PROCEDURE TRAY) ×1 IMPLANT
SHEATH GLIDE SLENDER 4/5FR (SHEATH) IMPLANT
SHEATH PINNACLE 5F 10CM (SHEATH) IMPLANT
SHEATH PINNACLE 7F 10CM (SHEATH) IMPLANT
SHEATH PROBE COVER 6X72 (BAG) IMPLANT
SYR MEDRAD MARK 7 150ML (SYRINGE) ×1 IMPLANT
TRANSDUCER W/STOPCOCK (MISCELLANEOUS) ×1 IMPLANT
TUBING CIL FLEX 10 FLL-RA (TUBING) ×1 IMPLANT
WIRE EMERALD 3MM-J .035X150CM (WIRE) IMPLANT

## 2022-11-22 NOTE — Interval H&P Note (Signed)
History and Physical Interval Note:  11/22/2022 4:25 PM  Kathleen Velazquez  has presented today for surgery, with the diagnosis of herat failure.  The various methods of treatment have been discussed with the patient and family. After consideration of risks, benefits and other options for treatment, the patient has consented to  Procedure(s): RIGHT/LEFT HEART CATH AND CORONARY ANGIOGRAPHY (N/A) as a surgical intervention.  The patient's history has been reviewed, patient examined, no change in status, stable for surgery.  I have reviewed the patient's chart and labs.  Questions were answered to the patient's satisfaction.    Cath Lab Visit (complete for each Cath Lab visit)  Clinical Evaluation Leading to the Procedure:   ACS: No.  Non-ACS:    Anginal Classification: CCS II  Anti-ischemic medical therapy: Minimal Therapy (1 class of medications)  Non-Invasive Test Results: No non-invasive testing performed  Prior CABG: No previous CABG        Verne Carrow

## 2022-11-22 NOTE — Progress Notes (Signed)
 Rounding Note    Patient Name: Kathleen Velazquez Date of Encounter: 11/22/2022  [facility] Cardiologist: None   Subjective   Patient sitting up in bed this morning. Reports some orthopnea but not currently requiring oxygen support. She denies chest pain or palpitations. Remains quite stressed about potential cardiac procedures but is agreeable today.  Inpatient Medications    Scheduled Meds:  gemfibrozil  600 mg Oral BID AC   melatonin  3 mg Oral QHS   scopolamine  1 patch Transdermal Q72H   spironolactone  12.5 mg Oral Daily   Continuous Infusions:  PRN Meds: acetaminophen **OR** acetaminophen, loperamide, oxyCODONE   Vital Signs    Vitals:   11/21/22 1617 11/21/22 2012 11/22/22 0012 11/22/22 0325  BP: (!) 116/91 114/87 121/89 102/68  Pulse: 90 75 (!) 50 95  Resp: 20 20 20 19  Temp: 97.7 F (36.5 C) 98.2 F (36.8 C) 97.8 F (36.6 C) 97.6 F (36.4 C)  TempSrc: Oral Oral Oral Oral  SpO2: 95% 97% 99% 98%  Weight:      Height:       No intake or output data in the 24 hours ending 11/22/22 0703    11/19/2022    2:45 AM 11/18/2022    7:11 PM 02/26/2020   11:52 AM  Last 3 Weights  Weight (lbs) 132 lb 4.4 oz 139 lb 15.9 oz 139 lb 15.9 oz  Weight (kg) 60 kg 63.5 kg 63.5 kg      Telemetry    Sinus rhythm with frequent PVCs, sinus tachycardia - Personally Reviewed  ECG    No new tracing - Personally Reviewed  Physical Exam   GEN: No acute distress.   Neck: Patient willing to lay more supine on exam today, evidence of volume overload with JVP to angle of mandible Cardiac: RRR, no murmurs, rubs, or gallops.  Respiratory: bilateral bases remain diminished. Cough with inspiration GI: Soft, nontender, non-distended  MS: 1-2+ bilateral pitting edema to mid calf Neuro:  Nonfocal  Psych: Normal affect   Labs    High Sensitivity Troponin:  No results for input(s): "TROPONINIHS" in the last 720 hours.   Chemistry Recent Labs  Lab 11/20/22 0317  11/20/22 1651 11/21/22 0351 11/22/22 0249  NA 140  --  139 136  K 3.9 4.2 4.2 3.5  CL 108  --  105 106  CO2 20*  --  21* 19*  GLUCOSE 162*  --  136* 112*  BUN 11  --  14 12  CREATININE 1.32*  --  1.49* 1.16*  CALCIUM 7.6*  --  8.3* 8.3*  MG 2.8*  --  2.0 2.1  PROT 5.6*  --  5.5* 5.6*  ALBUMIN 2.9*  --  2.8* 2.9*  AST 75*  --  108* 63*  ALT 42  --  88* 77*  ALKPHOS 95  --  78 92  BILITOT 1.9*  --  1.7* 1.3*  GFRNONAA 44*  --  38* 51*  ANIONGAP 12  --  13 11    Lipids No results for input(s): "CHOL", "TRIG", "HDL", "LABVLDL", "LDLCALC", "CHOLHDL" in the last 168 hours.  Hematology Recent Labs  Lab 11/20/22 0317 11/21/22 0351 11/21/22 1322 11/22/22 0249  WBC 9.9 6.9  --  5.4  RBC 3.77* 3.28*  --  3.36*  HGB 13.3 11.2* 11.9* 11.5*  HCT 39.6 35.1* 37.1 35.4*  MCV 105.0* 107.0*  --  105.4*  MCH 35.3* 34.1*  --  34.2*  MCHC 33.6   31.9  --  32.5  RDW 14.7 14.5  --  14.3  PLT 150 109*  --  117*   Thyroid  Recent Labs  Lab 11/19/22 0345 11/19/22 0941  TSH 6.980*  --   FREET4  --  0.71    BNP Recent Labs  Lab 11/20/22 1651  BNP 1,370.1*    DDimer  Recent Labs  Lab 11/20/22 1651  DDIMER 2.94*     Radiology    NM Pulmonary Perfusion  Result Date: 11/21/2022 CLINICAL DATA:  Pulmonary embolism suspected. High probability. Sinus tachycardia. Right-sided weakness. EXAM: NUCLEAR MEDICINE PERFUSION LUNG SCAN TECHNIQUE: Perfusion images were obtained in multiple projections after intravenous injection of radiopharmaceutical. Ventilation scans intentionally deferred if perfusion scan and chest x-ray adequate for interpretation during COVID 19 epidemic. RADIOPHARMACEUTICALS:  3.6 mCi Tc-99m MAA IV COMPARISON:  No prior V/Q scan available for comparison; correlation is made with chest radiographs 11/21/2022, 11/19/2022, 07/30/2016 FINDINGS: There is normal homogeneous distribution of radiotracer. No large segmental defect is seen. IMPRESSION: Normal perfusion scan (pulmonary  embolism absent) by perfusion only modified PIOPED II criteria. Electronically Signed   By: Ronald  Viola M.D.   On: 11/21/2022 16:08   DG CHEST PORT 1 VIEW  Result Date: 11/21/2022 CLINICAL DATA:  Weakness.  Foot swelling. EXAM: PORTABLE CHEST 1 VIEW COMPARISON:  Radiographs 11/19/2022 and 07/30/2016.  CT 07/29/2016. FINDINGS: 0950 hours. Stable cardiomegaly, vascular congestion and bilateral pleural effusions. There is associated dependent atelectasis at both lung bases. No pneumothorax. The bones appear unchanged. Telemetry leads overlie the chest. IMPRESSION: Cardiomegaly, vascular congestion and bilateral pleural effusions consistent with congestive heart failure. No significant change from previous study of 2 days prior. Electronically Signed   By: William  Veazey M.D.   On: 11/21/2022 10:16   ECHOCARDIOGRAM COMPLETE  Result Date: 11/20/2022    ECHOCARDIOGRAM REPORT   Patient Name:   Kathleen Velazquez Date of Exam: 11/20/2022 Medical Rec #:  [id number]       Height:       62.0 in Accession #:    [id number]      Weight:       132.3 lb Date of Birth:  03/21/1956       BSA:          1.604 m Patient Age:    67 years        BP:           88/63 mmHg Patient Gender: F               HR:           80 bpm. Exam Location:  Inpatient Procedure: 2D Echo, Cardiac Doppler and Color Doppler Indications:    Acute respiratory distress R06.03  History:        Patient has no prior history of Echocardiogram examinations.                 Arrythmias:Tachycardia; Risk Factors:Dyslipidemia.  Sonographer:    Shanika Turnbull Referring Phys: [id number] AMRIT ADHIKARI IMPRESSIONS  1. Very difficult study due to poor echo windows and lack of visualization of LV endocardium. However, based on limited views, there is severe biventricular failure with estimated LVEF ~25% (endocardium very poorly visualized) and severe RV dysfunction.  2. Left ventricular ejection fraction, by estimation, is 25 to 30%. The left ventricle has severely  decreased function. Left ventricular endocardial border not optimally defined to evaluate regional wall motion. There is mild left ventricular hypertrophy. Left ventricular diastolic parameters are   consistent with Grade II diastolic dysfunction (pseudonormalization).  3. Right ventricular systolic function is severely reduced. The right ventricular size is mildly enlarged.  4. Left atrial size was mildly dilated.  5. The mitral valve is degenerative. Mild to moderate mitral valve regurgitation. Moderate mitral annular calcification.  6. The aortic valve is tricuspid. There is mild calcification of the aortic valve. There is mild thickening of the aortic valve. Aortic valve regurgitation is not visualized. Aortic valve sclerosis/calcification is present, without any evidence of aortic stenosis.  7. The inferior vena cava is dilated in size with <50% respiratory variability, suggesting right atrial pressure of 15 mmHg.  8. Moderate pericardial effusion. There is no evidence of cardiac tamponade. Comparison(s): No prior Echocardiogram. FINDINGS  Left Ventricle: Left ventricular ejection fraction, by estimation, is 25 to 30%. The left ventricle has severely decreased function. Left ventricular endocardial border not optimally defined to evaluate regional wall motion. The left ventricular internal cavity size was normal in size. There is mild left ventricular hypertrophy. Left ventricular diastolic parameters are consistent with Grade II diastolic dysfunction (pseudonormalization). Right Ventricle: The right ventricular size is mildly enlarged. Right vetricular wall thickness was not well visualized. Right ventricular systolic function is severely reduced. The tricuspid regurgitant velocity is 2.05 m/s, and with an assumed right atrial pressure of 15 mmHg, the estimated right ventricular systolic pressure is 31.8 mmHg. Left Atrium: Left atrial size was mildly dilated. Right Atrium: Right atrial size was normal in size.  Pericardium: A moderately sized pericardial effusion is present. There is no evidence of cardiac tamponade. Mitral Valve: The mitral valve is degenerative in appearance. Moderate mitral annular calcification. Mild to moderate mitral valve regurgitation. Tricuspid Valve: The tricuspid valve is normal in structure. Tricuspid valve regurgitation is mild. Aortic Valve: The aortic valve is tricuspid. There is mild calcification of the aortic valve. There is mild thickening of the aortic valve. Aortic valve regurgitation is not visualized. Aortic valve sclerosis/calcification is present, without any evidence of aortic stenosis. Aortic valve peak gradient measures 4.2 mmHg. Pulmonic Valve: The pulmonic valve was normal in structure. Pulmonic valve regurgitation is trivial. Aorta: The aortic root and ascending aorta are structurally normal, with no evidence of dilitation. Venous: The inferior vena cava is dilated in size with less than 50% respiratory variability, suggesting right atrial pressure of 15 mmHg. IAS/Shunts: The atrial septum is grossly normal.  LEFT VENTRICLE PLAX 2D LVIDd:         4.30 cm   Diastology LVIDs:         3.00 cm   LV e' lateral:   6.18 cm/s LV PW:         1.10 cm   LV E/e' lateral: 12.4 LV IVS:        1.20 cm LVOT diam:     2.00 cm LV SV:         32 LV SV Index:   20 LVOT Area:     3.14 cm  LEFT ATRIUM           Index        RIGHT ATRIUM           Index LA diam:      4.20 cm 2.62 cm/m   RA Area:     14.20 cm LA Vol (A4C): 60.9 ml 37.98 ml/m  RA Volume:   34.50 ml  21.51 ml/m  AORTIC VALVE AV Area (Vmax): 1.94 cm AV Vmax:        102.00 cm/s AV   Peak Grad:   4.2 mmHg LVOT Vmax:      62.93 cm/s LVOT Vmean:     39.400 cm/s LVOT VTI:       0.103 m  AORTA Ao Root diam: 3.00 cm Ao Asc diam:  3.20 cm MITRAL VALVE               TRICUSPID VALVE MV Area (PHT): 6.96 cm    TR Peak grad:   16.8 mmHg MV Decel Time: 109 msec    TR Vmax:        205.00 cm/s MV E velocity: 76.70 cm/s MV A velocity: 40.70 cm/s   SHUNTS MV E/A ratio:  1.88        Systemic VTI:  0.10 m                            Systemic Diam: 2.00 cm Heather Pemberton MD Electronically signed by Heather Pemberton MD Signature Date/Time: 11/20/2022/3:28:17 PM    Final     Cardiac Studies   TTE 11/20/22: IMPRESSIONS     1. Very difficult study due to poor echo windows and lack of  visualization of LV endocardium. However, based on limited views, there is  severe biventricular failure with estimated LVEF ~25% (endocardium very  poorly visualized) and severe RV dysfunction.   2. Left ventricular ejection fraction, by estimation, is 25 to 30%. The  left ventricle has severely decreased function. Left ventricular  endocardial border not optimally defined to evaluate regional wall motion.  There is mild left ventricular  hypertrophy. Left ventricular diastolic parameters are consistent with  Grade II diastolic dysfunction (pseudonormalization).   3. Right ventricular systolic function is severely reduced. The right  ventricular size is mildly enlarged.   4. Left atrial size was mildly dilated.   5. The mitral valve is degenerative. Mild to moderate mitral valve  regurgitation. Moderate mitral annular calcification.   6. The aortic valve is tricuspid. There is mild calcification of the  aortic valve. There is mild thickening of the aortic valve. Aortic valve  regurgitation is not visualized. Aortic valve sclerosis/calcification is  present, without any evidence of  aortic stenosis.   7. The inferior vena cava is dilated in size with <50% respiratory  variability, suggesting right atrial pressure of 15 mmHg.   8. Moderate pericardial effusion. There is no evidence of cardiac  tamponade.   Comparison(s): No prior Echocardiogram.   Patient Profile     Kathleen Velazquez is a 67 y.o. female with a hx of anxiety, GERD, HTN, IBS, and sinus tachycardia who is being seen for the evaluation of severe biventricular failure and severe RV  dysfunction at the request of Dr. Woods.   Assessment & Plan    Severe Biventricular HF   TTE with LVEF 25-30%, severe RV dysfunction which is newly diagnosed. BNP 1370.1. ECG with NSR, PVCs, TWI in anterolateral leads but these are chronic. Etiology of BiV failure unknown with no history of ischemic heart disease or known cardiomyopathy   Patient s/p 20mg IV lasix 4/14. I/O does not appear accurate. Remains volume up, will give IV lasix 40mg this morning. Ongoing concern for low output, hold beta blocker Patient was advised on need for LHC/RHC and possible MRI yesterday but declined both. Later in the day with Dr. Woods, patient changed her mind and is more amenable to catheterization. Tentative plans for LHC/RHC today to better assess cardiac output. Patient tolerating spironolactone, elevated creatinine   prevents addition of further GDMT at this time.  Shared Decision Making/Informed Consent The risks [stroke (1 in 1000), death (1 in 1000), kidney failure [usually temporary] (1 in 500), bleeding (1 in 200), allergic reaction [possibly serious] (1 in 200)], benefits (diagnostic support and management of coronary artery disease) and alternatives of a cardiac catheterization were discussed in detail with Ms. Andujar and she is willing to proceed.   Elevated D-Dimer   No known history of PE but D-dimer elevated to 2.94 in setting of severe RV dysfunction.    Heparin started empirically yesterday given RV dysfunction and d-dimer. Patient had previously declined imaging but was agreeable to V/Q scan. Heparin held prior to scan due to episode of hematochezia. V/Q scan without PE.   Hypokalemia   Occurred in the setting of diarrhea with K as low as 2.1 which has improved with supplementation and spironolactone. Continue to monitor/replace to maintain K>4.   Sinus tachycardia   Continue to hold beta blocker for now given concern for low output state   Frequent PVCs   Patient continues with  frequent PVCs. Would plan to resume beta blocker with clinical improvement/clarification of cardiac output with RHC.     For questions or updates, please contact [facility] Please consult [email or web address] for contact info under        Signed, Arelene Moroni, PA-C  11/22/2022, 7:03 AM    

## 2022-11-22 NOTE — H&P (View-Only) (Signed)
Rounding Note    Patient Name: Kathleen Velazquez Date of Encounter: 11/22/2022  [facility] Cardiologist: None   Subjective   Patient sitting up in bed this morning. Reports some orthopnea but not currently requiring oxygen support. She denies chest pain or palpitations. Remains quite stressed about potential cardiac procedures but is agreeable today.  Inpatient Medications    Scheduled Meds:  gemfibrozil  600 mg Oral BID AC   melatonin  3 mg Oral QHS   scopolamine  1 patch Transdermal Q72H   spironolactone  12.5 mg Oral Daily   Continuous Infusions:  PRN Meds: acetaminophen **OR** acetaminophen, loperamide, oxyCODONE   Vital Signs    Vitals:   11/21/22 1617 11/21/22 2012 11/22/22 0012 11/22/22 0325  BP: (!) 116/91 114/87 121/89 102/68  Pulse: 90 75 (!) 50 95  Resp: 20 20 20 19   Temp: 97.7 F (36.5 C) 98.2 F (36.8 C) 97.8 F (36.6 C) 97.6 F (36.4 C)  TempSrc: Oral Oral Oral Oral  SpO2: 95% 97% 99% 98%  Weight:      Height:       No intake or output data in the 24 hours ending 11/22/22 0703    11/19/2022    2:45 AM 11/18/2022    7:11 PM 02/26/2020   11:52 AM  Last 3 Weights  Weight (lbs) 132 lb 4.4 oz 139 lb 15.9 oz 139 lb 15.9 oz  Weight (kg) 60 kg 63.5 kg 63.5 kg      Telemetry    Sinus rhythm with frequent PVCs, sinus tachycardia - Personally Reviewed  ECG    No new tracing - Personally Reviewed  Physical Exam   GEN: No acute distress.   Neck: Patient willing to lay more supine on exam today, evidence of volume overload with JVP to angle of mandible Cardiac: RRR, no murmurs, rubs, or gallops.  Respiratory: bilateral bases remain diminished. Cough with inspiration GI: Soft, nontender, non-distended  MS: 1-2+ bilateral pitting edema to mid calf Neuro:  Nonfocal  Psych: Normal affect   Labs    High Sensitivity Troponin:  No results for input(s): "TROPONINIHS" in the last 720 hours.   Chemistry Recent Labs  Lab 11/20/22 0317  11/20/22 1651 11/21/22 0351 11/22/22 0249  NA 140  --  139 136  K 3.9 4.2 4.2 3.5  CL 108  --  105 106  CO2 20*  --  21* 19*  GLUCOSE 162*  --  136* 112*  BUN 11  --  14 12  CREATININE 1.32*  --  1.49* 1.16*  CALCIUM 7.6*  --  8.3* 8.3*  MG 2.8*  --  2.0 2.1  PROT 5.6*  --  5.5* 5.6*  ALBUMIN 2.9*  --  2.8* 2.9*  AST 75*  --  108* 63*  ALT 42  --  88* 77*  ALKPHOS 95  --  78 92  BILITOT 1.9*  --  1.7* 1.3*  GFRNONAA 44*  --  38* 51*  ANIONGAP 12  --  13 11    Lipids No results for input(s): "CHOL", "TRIG", "HDL", "LABVLDL", "LDLCALC", "CHOLHDL" in the last 168 hours.  Hematology Recent Labs  Lab 11/20/22 0317 11/21/22 0351 11/21/22 1322 11/22/22 0249  WBC 9.9 6.9  --  5.4  RBC 3.77* 3.28*  --  3.36*  HGB 13.3 11.2* 11.9* 11.5*  HCT 39.6 35.1* 37.1 35.4*  MCV 105.0* 107.0*  --  105.4*  MCH 35.3* 34.1*  --  34.2*  MCHC 33.6  31.9  --  32.5  RDW 14.7 14.5  --  14.3  PLT 150 109*  --  117*   Thyroid  Recent Labs  Lab 11/19/22 0345 11/19/22 0941  TSH 6.980*  --   FREET4  --  0.71    BNP Recent Labs  Lab 11/20/22 1651  BNP 1,370.1*    DDimer  Recent Labs  Lab 11/20/22 1651  DDIMER 2.94*     Radiology    NM Pulmonary Perfusion  Result Date: 11/21/2022 CLINICAL DATA:  Pulmonary embolism suspected. High probability. Sinus tachycardia. Right-sided weakness. EXAM: NUCLEAR MEDICINE PERFUSION LUNG SCAN TECHNIQUE: Perfusion images were obtained in multiple projections after intravenous injection of radiopharmaceutical. Ventilation scans intentionally deferred if perfusion scan and chest x-ray adequate for interpretation during COVID 19 epidemic. RADIOPHARMACEUTICALS:  3.6 mCi Tc-35m MAA IV COMPARISON:  No prior V/Q scan available for comparison; correlation is made with chest radiographs 11/21/2022, 11/19/2022, 07/30/2016 FINDINGS: There is normal homogeneous distribution of radiotracer. No large segmental defect is seen. IMPRESSION: Normal perfusion scan (pulmonary  embolism absent) by perfusion only modified PIOPED II criteria. Electronically Signed   By: Neita Garnet M.D.   On: 11/21/2022 16:08   DG CHEST PORT 1 VIEW  Result Date: 11/21/2022 CLINICAL DATA:  Weakness.  Foot swelling. EXAM: PORTABLE CHEST 1 VIEW COMPARISON:  Radiographs 11/19/2022 and 07/30/2016.  CT 07/29/2016. FINDINGS: 0950 hours. Stable cardiomegaly, vascular congestion and bilateral pleural effusions. There is associated dependent atelectasis at both lung bases. No pneumothorax. The bones appear unchanged. Telemetry leads overlie the chest. IMPRESSION: Cardiomegaly, vascular congestion and bilateral pleural effusions consistent with congestive heart failure. No significant change from previous study of 2 days prior. Electronically Signed   By: Carey Bullocks M.D.   On: 11/21/2022 10:16   ECHOCARDIOGRAM COMPLETE  Result Date: 11/20/2022    ECHOCARDIOGRAM REPORT   Patient Name:   Kathleen Velazquez Date of Exam: 11/20/2022 Medical Rec #:  [id number]       Height:       62.0 in Accession #:    [id number]      Weight:       132.3 lb Date of Birth:  [birth year]       BSA:          1.604 m Patient Age:    67 years        BP:           88/63 mmHg Patient Gender: F               HR:           80 bpm. Exam Location:  Inpatient Procedure: 2D Echo, Cardiac Doppler and Color Doppler Indications:    Acute respiratory distress R06.03  History:        Patient has no prior history of Echocardiogram examinations.                 Arrythmias:Tachycardia; Risk Factors:Dyslipidemia.  Sonographer:    Lucendia Herrlich Referring Phys: [id number] AMRIT ADHIKARI IMPRESSIONS  1. Very difficult study due to poor echo windows and lack of visualization of LV endocardium. However, based on limited views, there is severe biventricular failure with estimated LVEF ~25% (endocardium very poorly visualized) and severe RV dysfunction.  2. Left ventricular ejection fraction, by estimation, is 25 to 30%. The left ventricle has severely  decreased function. Left ventricular endocardial border not optimally defined to evaluate regional wall motion. There is mild left ventricular hypertrophy. Left ventricular diastolic parameters are  consistent with Grade II diastolic dysfunction (pseudonormalization).  3. Right ventricular systolic function is severely reduced. The right ventricular size is mildly enlarged.  4. Left atrial size was mildly dilated.  5. The mitral valve is degenerative. Mild to moderate mitral valve regurgitation. Moderate mitral annular calcification.  6. The aortic valve is tricuspid. There is mild calcification of the aortic valve. There is mild thickening of the aortic valve. Aortic valve regurgitation is not visualized. Aortic valve sclerosis/calcification is present, without any evidence of aortic stenosis.  7. The inferior vena cava is dilated in size with <50% respiratory variability, suggesting right atrial pressure of 15 mmHg.  8. Moderate pericardial effusion. There is no evidence of cardiac tamponade. Comparison(s): No prior Echocardiogram. FINDINGS  Left Ventricle: Left ventricular ejection fraction, by estimation, is 25 to 30%. The left ventricle has severely decreased function. Left ventricular endocardial border not optimally defined to evaluate regional wall motion. The left ventricular internal cavity size was normal in size. There is mild left ventricular hypertrophy. Left ventricular diastolic parameters are consistent with Grade II diastolic dysfunction (pseudonormalization). Right Ventricle: The right ventricular size is mildly enlarged. Right vetricular wall thickness was not well visualized. Right ventricular systolic function is severely reduced. The tricuspid regurgitant velocity is 2.05 m/s, and with an assumed right atrial pressure of 15 mmHg, the estimated right ventricular systolic pressure is 31.8 mmHg. Left Atrium: Left atrial size was mildly dilated. Right Atrium: Right atrial size was normal in size.  Pericardium: A moderately sized pericardial effusion is present. There is no evidence of cardiac tamponade. Mitral Valve: The mitral valve is degenerative in appearance. Moderate mitral annular calcification. Mild to moderate mitral valve regurgitation. Tricuspid Valve: The tricuspid valve is normal in structure. Tricuspid valve regurgitation is mild. Aortic Valve: The aortic valve is tricuspid. There is mild calcification of the aortic valve. There is mild thickening of the aortic valve. Aortic valve regurgitation is not visualized. Aortic valve sclerosis/calcification is present, without any evidence of aortic stenosis. Aortic valve peak gradient measures 4.2 mmHg. Pulmonic Valve: The pulmonic valve was normal in structure. Pulmonic valve regurgitation is trivial. Aorta: The aortic root and ascending aorta are structurally normal, with no evidence of dilitation. Venous: The inferior vena cava is dilated in size with less than 50% respiratory variability, suggesting right atrial pressure of 15 mmHg. IAS/Shunts: The atrial septum is grossly normal.  LEFT VENTRICLE PLAX 2D LVIDd:         4.30 cm   Diastology LVIDs:         3.00 cm   LV e' lateral:   6.18 cm/s LV PW:         1.10 cm   LV E/e' lateral: 12.4 LV IVS:        1.20 cm LVOT diam:     2.00 cm LV SV:         32 LV SV Index:   20 LVOT Area:     3.14 cm  LEFT ATRIUM           Index        RIGHT ATRIUM           Index LA diam:      4.20 cm 2.62 cm/m   RA Area:     14.20 cm LA Vol (A4C): 60.9 ml 37.98 ml/m  RA Volume:   34.50 ml  21.51 ml/m  AORTIC VALVE AV Area (Vmax): 1.94 cm AV Vmax:        102.00 cm/s AV  Peak Grad:   4.2 mmHg LVOT Vmax:      62.93 cm/s LVOT Vmean:     39.400 cm/s LVOT VTI:       0.103 m  AORTA Ao Root diam: 3.00 cm Ao Asc diam:  3.20 cm MITRAL VALVE               TRICUSPID VALVE MV Area (PHT): 6.96 cm    TR Peak grad:   16.8 mmHg MV Decel Time: 109 msec    TR Vmax:        205.00 cm/s MV E velocity: 76.70 cm/s MV A velocity: 40.70 cm/s   SHUNTS MV E/A ratio:  1.88        Systemic VTI:  0.10 m                            Systemic Diam: 2.00 cm Laurance Flatten MD Electronically signed by Laurance Flatten MD Signature Date/Time: 11/20/2022/3:28:17 PM    Final     Cardiac Studies   TTE 11/20/22: IMPRESSIONS     1. Very difficult study due to poor echo windows and lack of  visualization of LV endocardium. However, based on limited views, there is  severe biventricular failure with estimated LVEF ~25% (endocardium very  poorly visualized) and severe RV dysfunction.   2. Left ventricular ejection fraction, by estimation, is 25 to 30%. The  left ventricle has severely decreased function. Left ventricular  endocardial border not optimally defined to evaluate regional wall motion.  There is mild left ventricular  hypertrophy. Left ventricular diastolic parameters are consistent with  Grade II diastolic dysfunction (pseudonormalization).   3. Right ventricular systolic function is severely reduced. The right  ventricular size is mildly enlarged.   4. Left atrial size was mildly dilated.   5. The mitral valve is degenerative. Mild to moderate mitral valve  regurgitation. Moderate mitral annular calcification.   6. The aortic valve is tricuspid. There is mild calcification of the  aortic valve. There is mild thickening of the aortic valve. Aortic valve  regurgitation is not visualized. Aortic valve sclerosis/calcification is  present, without any evidence of  aortic stenosis.   7. The inferior vena cava is dilated in size with <50% respiratory  variability, suggesting right atrial pressure of 15 mmHg.   8. Moderate pericardial effusion. There is no evidence of cardiac  tamponade.   Comparison(s): No prior Echocardiogram.   Patient Profile     Kathleen Velazquez is a 67 y.o. female with a hx of anxiety, GERD, HTN, IBS, and sinus tachycardia who is being seen for the evaluation of severe biventricular failure and severe RV  dysfunction at the request of Dr. Joseph Art.   Assessment & Plan    Severe Biventricular HF   TTE with LVEF 25-30%, severe RV dysfunction which is newly diagnosed. BNP 1370.1. ECG with NSR, PVCs, TWI in anterolateral leads but these are chronic. Etiology of BiV failure unknown with no history of ischemic heart disease or known cardiomyopathy   Patient s/p 20mg  IV lasix 4/14. I/O does not appear accurate. Remains volume up, will give IV lasix 40mg  this morning. Ongoing concern for low output, hold beta blocker Patient was advised on need for LHC/RHC and possible MRI yesterday but declined both. Later in the day with Dr. Joseph Art, patient changed her mind and is more amenable to catheterization. Tentative plans for LHC/RHC today to better assess cardiac output. Patient tolerating spironolactone, elevated creatinine  prevents addition of further GDMT at this time.  Shared Decision Making/Informed Consent The risks [stroke (1 in 1000), death (1 in 1000), kidney failure [usually temporary] (1 in 500), bleeding (1 in 200), allergic reaction [possibly serious] (1 in 200)], benefits (diagnostic support and management of coronary artery disease) and alternatives of a cardiac catheterization were discussed in detail with Ms. Straker and she is willing to proceed.   Elevated D-Dimer   No known history of PE but D-dimer elevated to 2.94 in setting of severe RV dysfunction.    Heparin started empirically yesterday given RV dysfunction and d-dimer. Patient had previously declined imaging but was agreeable to V/Q scan. Heparin held prior to scan due to episode of hematochezia. V/Q scan without PE.   Hypokalemia   Occurred in the setting of diarrhea with K as low as 2.1 which has improved with supplementation and spironolactone. Continue to monitor/replace to maintain K>4.   Sinus tachycardia   Continue to hold beta blocker for now given concern for low output state   Frequent PVCs   Patient continues with  frequent PVCs. Would plan to resume beta blocker with clinical improvement/clarification of cardiac output with RHC.     For questions or updates, please contact [facility] Please consult [email or web address] for contact info under        Signed, Perlie Gold, PA-C  11/22/2022, 7:03 AM

## 2022-11-22 NOTE — Progress Notes (Signed)
Physical Therapy Treatment Patient Details Name: Kathleen Velazquez MRN: [id number] DOB: 12-Jul-1956 Today's Date: 11/22/2022   History of Present Illness Pt is a 67 yo female presenting to ED with reports of R sided weakness. Imaging without acute findings, hospital course complicated by low potassium. PMH: anxiety, GERD, HTN, IBS, sinus tachycardia, arthritis    PT Comments    Pt with fair tolerance to treatment today. Pt was able to ambulate in hallway with RW Min A. Pt had 1 instance of knee buckling however was able to self correct. Pt had 1 LOB when turning around to sit on EOB requiring Mod A to correct. Pt states that she tripped over the walker and was educated on bringing walker completely around when turning. Pt is scheduled for cardiac procedure today which pt states she feels very nervous about. No change in DC/DME recs at this time. PT will continue to follow.  Recommendations for follow up therapy are one component of a multi-disciplinary discharge planning process, led by the attending physician.  Recommendations may be updated based on patient status, additional functional criteria and insurance authorization.  Follow Up Recommendations       Assistance Recommended at Discharge Intermittent Supervision/Assistance  Patient can return home with the following A little help with walking and/or transfers;A little help with bathing/dressing/bathroom;Assistance with cooking/housework;Direct supervision/assist for medications management;Direct supervision/assist for financial management;Assist for transportation;Help with stairs or ramp for entrance   Equipment Recommendations  None recommended by PT    Recommendations for Other Services       Precautions / Restrictions Precautions Precautions: Fall Restrictions Weight Bearing Restrictions: No     Mobility  Bed Mobility               General bed mobility comments: sitting on EOB at start and end of session     Transfers Overall transfer level: Needs assistance Equipment used: Rolling walker (2 wheels) Transfers: Sit to/from Stand Sit to Stand: Min guard                Ambulation/Gait Ambulation/Gait assistance: Min guard, Min assist Gait Distance (Feet): 30 Feet Assistive device: Rolling walker (2 wheels) Gait Pattern/deviations: Step-through pattern, Decreased stride length, Shuffle, Knees buckling Gait velocity: decreased     General Gait Details: Pt had one instance of knee buckling in hallway however able to self correct. Once back in room pt had 1 LOB when turning to sit on EOB requiring Mod A to correct.   Stairs             Wheelchair Mobility    Modified Rankin (Stroke Patients Only)       Balance Overall balance assessment: Needs assistance Sitting-balance support: Bilateral upper extremity supported, Feet supported Sitting balance-Leahy Scale: Good Sitting balance - Comments: sitting EOB   Standing balance support: During functional activity, Bilateral upper extremity supported, Single extremity supported Standing balance-Leahy Scale: Poor Standing balance comment: Pt had 1 LOB when turning around to sit on EOB requiring Mod A to correct. Pt states that she tripped over the walker.                            Cognition Arousal/Alertness: Awake/alert Behavior During Therapy: Flat affect, Anxious Overall Cognitive Status: Within Functional Limits for tasks assessed  General Comments: Pt reports feeling anxious about upcoming cardiac procedure today.        Exercises      General Comments General comments (skin integrity, edema, etc.): VSS on RA      Pertinent Vitals/Pain Pain Assessment Pain Assessment: No/denies pain    Home Living                          Prior Function            PT Goals (current goals can now be found in the care plan section) Progress towards PT  goals: Progressing toward goals    Frequency    Min 1X/week      PT Plan Current plan remains appropriate    Co-evaluation              AM-PAC PT "6 Clicks" Mobility   Outcome Measure  Help needed turning from your back to your side while in a flat bed without using bedrails?: None Help needed moving from lying on your back to sitting on the side of a flat bed without using bedrails?: None Help needed moving to and from a bed to a chair (including a wheelchair)?: A Little Help needed standing up from a chair using your arms (e.g., wheelchair or bedside chair)?: A Little Help needed to walk in hospital room?: A Little Help needed climbing 3-5 steps with a railing? : Total 6 Click Score: 18    End of Session Equipment Utilized During Treatment: Gait belt Activity Tolerance: Patient tolerated treatment well Patient left: in bed;with call bell/phone within reach (Seated EOB) Nurse Communication: Mobility status PT Visit Diagnosis: Unsteadiness on feet (R26.81);Other abnormalities of gait and mobility (R26.89);Repeated falls (R29.6);Muscle weakness (generalized) (M62.81)     Time: 4098-1191 PT Time Calculation (min) (ACUTE ONLY): 12 min  Charges:  $Gait Training: 8-22 mins                     Shela Nevin, PT, DPT Acute Rehab Services [phone]    [name] 11/22/2022, 1:53 PM

## 2022-11-22 NOTE — Progress Notes (Signed)
Daily Progress Note   Patient Name: Kathleen Velazquez       Date: 11/22/2022 DOB: 1956-01-04  Age: 67 y.o. MRN#: [id number] Attending Physician: Drema Dallas, MD Primary Care Physician: Toma Deiters, MD Admit Date: 11/18/2022 Length of Stay: 3 days  Reason for Consultation/Follow-up: Establishing goals of care  HPI/Patient Profile:  67 y.o. female  with past medical history of anxiety, GERD, hypertension, sinus tachycardia, IBS with chronic diarrhea who presented to the emergency department with complaint of right-sided weakness, difficulty in ambulation.  Stroke was suspected on admission.  CT head did not show any acute findings. She admitted on 11/18/2022 with right-sided weakness, severe anemia, chronic diarrhea, prolonged QT (likely due to hypokalemia).    Further workup found severe new onset HFrEF with EF 25 to 30%.   PMT was consulted for GOC conversations.  Subjective:   Subjective: Chart Reviewed. Updates received. Patient Assessed. Created space and opportunity for patient  and family to explore thoughts and feelings regarding current medical situation.  Today's Discussion: Today saw the patient at bedside.  She is scheduled for cardiac cath today but has not gone yet.  We talked about her anxiety about the cardiac cath.  I explained further the procedure and how I have personally witnessed many of these procedures without complication.  I also explained that they would have medicine available for anxiety and pain.  We talked about her change of mind yesterday to allow cardiac cath.  I expressed my curiosity and asked about what made her change her mind and she pointed to her grandson's pictures on her purse.  We spent substantial time talking about her grandsons and how much she mean to her.  We shared stories about her time with family and raising.  Currently the oldest is 69 years old.  She talks about how the grandsons have had a little bit of a difficult time excepting that  she is sick and in the hospital.  However, she is glad that they are coming later today to visit.  I told her that once they do the cardiac cath they will have more information to know if there is something we can do to help her heart.  She seems encouraged by this possibility.  However, I explained that nothing is guaranteed and we would take it as it calms.  She agreed.  I told her I would be back tomorrow to check on her and see if there is anything I can do to help.  I provided emotional and general support through therapeutic listening, empathy, sharing of stories, and other techniques. I answered all questions and addressed all concerns to the best of my ability.  Review of Systems  Constitutional:  Positive for fatigue.       Denies pain in general  Respiratory:  Positive for cough and shortness of breath (Improved).   Gastrointestinal:  Negative for abdominal pain, nausea and vomiting.  Neurological:  Positive for weakness.    Objective:   Vital Signs:  BP 117/69 (BP Location: Left Arm)   Pulse 93   Temp 98.1 F (36.7 C) (Oral)   Resp 18   Ht  (1.575 m)   Wt 60 kg   SpO2 98%   BMI 24.19 kg/m   Physical Exam: Physical Exam Vitals and nursing note reviewed.  Constitutional:      General: She is not in acute distress.    Appearance: She is ill-appearing.  HENT:     Head: Normocephalic  and atraumatic.  Cardiovascular:     Rate and Rhythm: Normal rate.  Pulmonary:     Effort: Pulmonary effort is normal. No respiratory distress.     Breath sounds: No wheezing or rhonchi.  Abdominal:     General: Abdomen is flat. Bowel sounds are normal. There is no distension.     Palpations: Abdomen is soft.     Tenderness: There is no abdominal tenderness.  Skin:    General: Skin is warm and dry.  Neurological:     General: No focal deficit present.     Mental Status: She is alert.  Psychiatric:        Mood and Affect: Mood normal.        Behavior: Behavior normal.      Palliative Assessment/Data: 50-60%    Existing Vynca/ACP Documentation: None  Assessment & Plan:   Impression: Present on Admission:  Hypokalemia  IBS (irritable bowel syndrome)  Sinus tachycardia  SUMMARY OF RECOMMENDATIONS   Full code Full scope of care Anticipate cardiac cath later today Further GOC conversations after more information available PMT will continue to follow  Symptom Management:  Per primary team PMT is available to assist as needed  Code Status: Full code  Prognosis: Unable to determine  Discharge Planning: To Be Determined  Discussed with: Patient, medical team and nursing team  Thank you for allowing [place] to participate in the care of [name] PMT will continue to support holistically.  Time Total: 60 min  Visit consisted of counseling and education dealing with the complex and emotionally intense issues of symptom management and palliative care in the setting of serious and potentially life-threatening illness. Greater than 50%  of this time was spent counseling and coordinating care related to the above assessment and plan.  Wynne Dust, NP Palliative Medicine Team  Team Phone # [phone] (Nights/Weekends)  04/06/2021, 8:17 AM

## 2022-11-22 NOTE — Care Management Important Message (Signed)
Important Message  Patient Details  Name: Kathleen Velazquez MRN: [id number] Date of Birth: 1955/08/09   Medicare Important Message Given:  Yes     Renie Ora 11/22/2022, 1:07 PM

## 2022-11-22 NOTE — Progress Notes (Signed)
PROGRESS NOTE    Reinholds CARMACK  ZOX:[id number] DOB: Jul 17, 1956 DOA: 11/18/2022 PCP: Toma Deiters, MD     Brief Narrative:   67 y.o. WF PMHx Anxiety, GERD, HTN, sinus tachycardia, irritable bowel syndrome,  Presents the ED with a chief complaint of right sided weakness.  Patient reports that she had right-sided weakness that started during the night between the 11th and 12th.  At 3 PM on the 12th she noted she could not move her right arm at all.  She could not lift it up and left she grabbed it with the left hand to lift it up.  She reports she was having difficulty with ambulation with weakness in her right leg, but the right leg was not dragging.  She had no numbness in her right leg or arm.  Patient reports no choking, slurred speech, drooling, facial droop.  She had no history of stroke to her knowledge.  She reports no associated headache, change in vision, change in hearing.  Patient has very low potassium at presentation.  She reports she has diarrhea all the time.  Every time she eats, and then sometimes even when she is not trying to eat she has diarrhea.  She reports this is due to IBS.  She had low potassium in the past.  She is not on any potassium supplementation at home.  Patient reports she has had some abdominal pain that is been consistent with her normal IBS pain.  She has not had any urinary frequency.  Patient has no other complaints at this time.   Patient does not smoke, does not drink, does not use illicit drugs.  She is vaccinated for COVID and flu.  Patient is full code.   Subjective: 4/16 A/O x 4, on her way down to the cardiac Cath Lab.  Negative CP,   Assessment & Plan: Covid vaccination;   Principal Problem:   Right sided weakness Active Problems:   Sinus tachycardia   Hypokalemia   IBS (irritable bowel syndrome)   Hyperlipidemia   Acute combined systolic and diastolic heart failure   Right ventricular failure   Gastrointestinal hemorrhage associated  with anorectal source  Acute systolic and diastolic CHF/pericardial effusion - Severe see results below - Per Dr. Laurance Flatten cardiology patient refuses any other studies/interventions. -4/15 Spoke at length with patient and she agrees to do whatever cardiology requires to improve her cardiac status.  To include cardiac MRI, cardiac catheterization etc. have notified palliative care and cardiology -4/16 s/p right/left heart cath see results below -4/16 cardiac recommendations: Guideline-directed medical therapy (GDMT) for heart failure.  She has received IV Lasix this am. I will give another 40 mg of IV Lasix tonight. Medical management of mild CAD.  -Lasix per cardiology - Spironolactone 12.5 mg daily  Right sided weakness - Right upper and lower extremity weakness - Last known well was the night of the 11th - CT head shows no acute intracranial findings - Neurology consulted and thinks this is likely due to the potassium but did recommend MRI - MRI ordered for the a.m. - PT and OT eval and treat - Every 4 hours neurochecks - Continue to monitor -4/14 PT/OT evaluation in a.m. for CIR vs SNF  Elevated D-dimer Lab Results  Component Value Date   DDIMER 2.94 (H) 11/20/2022  -Mildly elevated not significantly worrisome for PE/DVT but given patient's multiple medical problems to be on the safe side recommend VQ scan - 4/14 Per Dr. Laurance Flatten cardiology patient  refuses any other studies/interventions. -4/15 PA Perlie Gold cardiology able to convince patient to at least except VQ scan pending ADDENDUM VQ scan negative for PE   Hyperlipidemia - Gemfibrozil 600 mg BID   IBS (irritable bowel syndrome) - Likely the etiology behind hypokalemia - Will add Imodium if needed - No diarrhea since being in the ER   Hypokalemia -Potassium goal.>4 - Likely due to to GI losses in the setting of diarrhea predominant IBS -4/14 K-Dur  Hypocalcemia - Calcium goal> 8.9 -  4/14 corrected calcium= 8.5 - 4/14 Calcium Gluconate IV 2 g   Sinus tachycardia - Continue home metoprolol  Goals of care - 4/14 palliative care consult: Patient with SEVERE acute systolic and diastolic CHF, refuses any interventions evaluate for change of CODE STATUS to DNR, evaluate for palliative care/hospice       Mobility Assessment (last 72 hours)     Mobility Assessment     Row Name 11/22/22 1300 11/22/22 0800 11/21/22 2000 11/21/22 1428 11/21/22 0800   Does patient have an order for bedrest or is patient medically unstable -- No - Continue assessment No - Continue assessment -- No - Continue assessment   What is the highest level of mobility based on the progressive mobility assessment? Level 5 (Walks with assist in room/hall) - Balance while stepping forward/back and can walk in room with assist - Complete Level 5 (Walks with assist in room/hall) - Balance while stepping forward/back and can walk in room with assist - Complete Level 5 (Walks with assist in room/hall) - Balance while stepping forward/back and can walk in room with assist - Complete Level 5 (Walks with assist in room/hall) - Balance while stepping forward/back and can walk in room with assist - Complete Level 5 (Walks with assist in room/hall) - Balance while stepping forward/back and can walk in room with assist - Complete   Is the above level different from baseline mobility prior to current illness? -- Yes - Recommend PT order Yes - Recommend PT order -- Yes - Recommend PT order    Row Name 11/20/22 1945 11/20/22 1322 11/20/22 1306 11/19/22 2000     Does patient have an order for bedrest or is patient medically unstable No - Continue assessment -- -- No - Continue assessment    What is the highest level of mobility based on the progressive mobility assessment? Level 5 (Walks with assist in room/hall) - Balance while stepping forward/back and can walk in room with assist - Complete Level 5 (Walks with assist in  room/hall) - Balance while stepping forward/back and can walk in room with assist - Complete Level 5 (Walks with assist in room/hall) - Balance while stepping forward/back and can walk in room with assist - Complete Level 2 (Chairfast) - Balance while sitting on edge of bed and cannot stand    Is the above level different from baseline mobility prior to current illness? Yes - Recommend PT order -- -- Yes - Recommend PT order                   DVT prophylaxis: Subcu heparin Code Status: Full Family Communication:  Status is: Inpatient    Dispo: The patient is from: Home              Anticipated d/c is to: Home              Anticipated d/c date is: > 3 days  Patient currently is not medically stable to d/c.      Consultants:  Cardiology Dr. Laurance Flatten   Procedures/Significant Events:  4/12 CT head W0 contrast -No acute intracranial findings are seen.  4/13 MRI brain W/W0 contrast - Negative for an acute infarct. No hemorrhage. No hydrocephalus 4/14 echocardiogram Left Ventricle: LVEF= 25 to 30%. The left ventricle has severely decreased function. -Grade II diastolic dysfunction (pseudonormalization).  -Right Ventricle:  Right ventricular systolic function is severely reduced. estimated right ventricular systolic pressure is 31.8 mmHg.  -Pericardium: A moderately sized pericardial effusion is present. There is  no evidence of cardiac tamponade.  -Mitral Valve: Mild to moderate mitral valve regurgitation.  4/16 RIGHT/LEFT heart cath    Mid LAD lesion is 20% stenosed.   Mid Cx lesion is 20% stenosed.   Mild non-obstructive disease in the mid Circumflex and mid LAD Moderate caliber non-dominant RCA with no disease.  Prominent Thebesian vein network noted on angiography Elevated right and left heart pressures Fick Cardiac Output 4.1 L/min Cardiac index 2.58          I have personally reviewed and interpreted all radiology studies and my  findings are as above.  VENTILATOR SETTINGS:    Cultures   Antimicrobials:    Devices    LINES / TUBES:      Continuous Infusions:  sodium chloride       Objective: Vitals:   11/22/22 1719 11/22/22 1743 11/22/22 1749 11/22/22 1843  BP: 109/77 116/88 120/86 (!) 128/91  Pulse: 95  93 89  Resp: (!) 21     Temp:      TempSrc:      SpO2:   98% 100%  Weight:      Height:        Intake/Output Summary (Last 24 hours) at 11/22/2022 1929 Last data filed at 11/22/2022 1050 Gross per 24 hour  Intake --  Output 1 ml  Net -1 ml    Filed Weights   11/18/22 1911 11/19/22 0245  Weight: 63.5 kg 60 kg   Physical Exam:  General: A/O x 4, No acute respiratory distress, cachectic Eyes: negative scleral hemorrhage, negative anisocoria, negative icterus ENT: Negative Runny nose, negative gingival bleeding, Neck:  Negative scars, masses, torticollis, lymphadenopathy, JVD Lungs: Clear to auscultation bilaterally without wheezes or crackles Cardiovascular: Regular rate and rhythm without murmur gallop or rub normal S1 and S2 Abdomen: negative abdominal pain, nondistended, positive soft, bowel sounds, no rebound, no ascites, no appreciable mass Extremities: No significant cyanosis, clubbing, or edema bilateral lower extremities Skin: Negative rashes, lesions, ulcers Psychiatric:  Negative depression, negative anxiety, negative fatigue, negative mania  Central nervous system:  Cranial nerves II through XII intact, tongue/uvula midline, all extremities muscle strength 5/5, sensation intact throughout, negative dysarthria, negative expressive aphasia, negative receptive aphasia.   .     Data Reviewed: Care during the described time interval was provided by me .  I have reviewed this patient's available data, including medical history, events of note, physical examination, and all test results as part of my evaluation.  CBC: Recent Labs  Lab 11/19/22 0347 11/19/22 0942  11/20/22 0317 11/21/22 0351 11/21/22 1322 11/22/22 0249 11/22/22 1702 11/22/22 1704 11/22/22 1707  WBC 5.7 6.4 9.9 6.9  --  5.4  --   --   --   NEUTROABS 3.2 4.2 7.1 4.1  --  3.4  --   --   --   HGB 12.4 12.8 13.3 11.2* 11.9* 11.5* 11.6* 11.9* 10.9*  HCT 35.4* 38.0 39.6 35.1* 37.1 35.4* 34.0* 35.0* 32.0*  MCV 98.9 101.9* 105.0* 107.0*  --  105.4*  --   --   --   PLT 142* 135* 150 109*  --  117*  --   --   --     Basic Metabolic Panel: Recent Labs  Lab 11/19/22 0345 11/19/22 0941 11/19/22 0942 11/19/22 1510 11/20/22 0317 11/20/22 1651 11/21/22 0351 11/22/22 0249 11/22/22 1702 11/22/22 1704 11/22/22 1707  NA 144  --  145  --  140  --  139 136 140 140 140  K <2.0*  --  2.1*   < > 3.9   < > 4.2 3.5 4.7 4.7 4.5  CL 110  --  105  --  108  --  105 106  --   --   --   CO2 27  --  24  --  20*  --  21* 19*  --   --   --   GLUCOSE 104*  --  101*  --  162*  --  136* 112*  --   --   --   BUN 9  --  9  --  11  --  14 12  --   --   --   CREATININE 0.84  --  0.75  --  1.32*  --  1.49* 1.16*  --   --   --   CALCIUM 7.1*  --  7.9*  --  7.6*  --  8.3* 8.3*  --   --   --   MG 1.2* 2.9* 2.9*  --  2.8*  --  2.0 2.1  --   --   --   PHOS  --   --  3.5  --  4.3  --  4.5 3.8  --   --   --    < > = values in this interval not displayed.    GFR: Estimated Creatinine Clearance: 36.2 mL/min (A) (by C-G formula based on SCr of 1.16 mg/dL (H)). Liver Function Tests: Recent Labs  Lab 11/19/22 0345 11/19/22 0942 11/20/22 0317 11/21/22 0351 11/22/22 0249  AST 61* 48* 75* 108* 63*  ALT 26 27 42 88* 77*  ALKPHOS 80 88 95 78 92  BILITOT 1.3* 1.1 1.9* 1.7* 1.3*  PROT 5.1* 5.5* 5.6* 5.5* 5.6*  ALBUMIN 2.8* 3.0* 2.9* 2.8* 2.9*    No results for input(s): "LIPASE", "AMYLASE" in the last 168 hours. No results for input(s): "AMMONIA" in the last 168 hours. Coagulation Profile: Recent Labs  Lab 11/21/22 0938  INR 1.2    Cardiac Enzymes: No results for input(s): "CKTOTAL", "CKMB",  "CKMBINDEX", "TROPONINI" in the last 168 hours. BNP (last 3 results) No results for input(s): "PROBNP" in the last 8760 hours. HbA1C: No results for input(s): "HGBA1C" in the last 72 hours. CBG: Recent Labs  Lab 11/18/22 1942  GLUCAP 122*    Lipid Profile: No results for input(s): "CHOL", "HDL", "LDLCALC", "TRIG", "CHOLHDL", "LDLDIRECT" in the last 72 hours. Thyroid Function Tests: No results for input(s): "TSH", "T4TOTAL", "FREET4", "T3FREE", "THYROIDAB" in the last 72 hours.  Anemia Panel: No results for input(s): "VITAMINB12", "FOLATE", "FERRITIN", "TIBC", "IRON", "RETICCTPCT" in the last 72 hours. Sepsis Labs: No results for input(s): "PROCALCITON", "LATICACIDVEN" in the last 168 hours.  No results found for this or any previous visit (from the past 240 hour(s)).       Radiology Studies: CARDIAC CATHETERIZATION  Result Date: 11/22/2022   Mid LAD  lesion is 20% stenosed.   Mid Cx lesion is 20% stenosed. Mild non-obstructive disease in the mid Circumflex and mid LAD Moderate caliber non-dominant RCA with no disease. Prominent Thebesian vein network noted on angiography Elevated right and left heart pressures Fick Cardiac Output 4.1 L/min Cardiac index 2.58 Recommendations: GDMT for heart failure. She has received IV Lasix this am. I will give another 40 mg of IV Lasix tonight. Medical management of mild CAD.   NM Pulmonary Perfusion  Result Date: 11/21/2022 CLINICAL DATA:  Pulmonary embolism suspected. High probability. Sinus tachycardia. Right-sided weakness. EXAM: NUCLEAR MEDICINE PERFUSION LUNG SCAN TECHNIQUE: Perfusion images were obtained in multiple projections after intravenous injection of radiopharmaceutical. Ventilation scans intentionally deferred if perfusion scan and chest x-ray adequate for interpretation during COVID 19 epidemic. RADIOPHARMACEUTICALS:  3.6 mCi Tc-65m MAA IV COMPARISON:  No prior V/Q scan available for comparison; correlation is made with chest  radiographs 11/21/2022, 11/19/2022, 07/30/2016 FINDINGS: There is normal homogeneous distribution of radiotracer. No large segmental defect is seen. IMPRESSION: Normal perfusion scan (pulmonary embolism absent) by perfusion only modified PIOPED II criteria. Electronically Signed   By: Neita Garnet M.D.   On: 11/21/2022 16:08   DG CHEST PORT 1 VIEW  Result Date: 11/21/2022 CLINICAL DATA:  Weakness.  Foot swelling. EXAM: PORTABLE CHEST 1 VIEW COMPARISON:  Radiographs 11/19/2022 and 07/30/2016.  CT 07/29/2016. FINDINGS: 0950 hours. Stable cardiomegaly, vascular congestion and bilateral pleural effusions. There is associated dependent atelectasis at both lung bases. No pneumothorax. The bones appear unchanged. Telemetry leads overlie the chest. IMPRESSION: Cardiomegaly, vascular congestion and bilateral pleural effusions consistent with congestive heart failure. No significant change from previous study of 2 days prior. Electronically Signed   By: Carey Bullocks M.D.   On: 11/21/2022 10:16        Scheduled Meds:  furosemide  40 mg Intravenous Q12H   gemfibrozil  600 mg Oral BID AC   melatonin  3 mg Oral QHS   scopolamine  1 patch Transdermal Q72H   sodium chloride flush  3 mL Intravenous Q12H   spironolactone  12.5 mg Oral Daily   Continuous Infusions:  sodium chloride       LOS: 3 days    Time spent:40 min    Adelynne Joerger, Roselind Messier, MD [facility]   If 7PM-7AM, please contact night-coverage 11/22/2022, 7:29 PM

## 2022-11-23 ENCOUNTER — Inpatient Hospital Stay (HOSPITAL_COMMUNITY): Payer: Medicare Other

## 2022-11-23 ENCOUNTER — Encounter (HOSPITAL_COMMUNITY): Payer: Self-pay | Admitting: Cardiovascular Disease

## 2022-11-23 DIAGNOSIS — E876 Hypokalemia: Secondary | ICD-10-CM | POA: Diagnosis not present

## 2022-11-23 DIAGNOSIS — Z515 Encounter for palliative care: Secondary | ICD-10-CM | POA: Diagnosis not present

## 2022-11-23 DIAGNOSIS — I5041 Acute combined systolic (congestive) and diastolic (congestive) heart failure: Secondary | ICD-10-CM | POA: Diagnosis not present

## 2022-11-23 DIAGNOSIS — K589 Irritable bowel syndrome without diarrhea: Secondary | ICD-10-CM

## 2022-11-23 DIAGNOSIS — Z7189 Other specified counseling: Secondary | ICD-10-CM | POA: Diagnosis not present

## 2022-11-23 DIAGNOSIS — R000 Tachycardia, unspecified: Secondary | ICD-10-CM | POA: Diagnosis not present

## 2022-11-23 DIAGNOSIS — R531 Weakness: Secondary | ICD-10-CM | POA: Diagnosis not present

## 2022-11-23 DIAGNOSIS — I50810 Right heart failure, unspecified: Secondary | ICD-10-CM | POA: Diagnosis not present

## 2022-11-23 LAB — URINALYSIS, ROUTINE W REFLEX MICROSCOPIC
Bilirubin Urine: NEGATIVE
Glucose, UA: NEGATIVE mg/dL
Hgb urine dipstick: NEGATIVE
Ketones, ur: NEGATIVE mg/dL
Leukocytes,Ua: NEGATIVE
Nitrite: NEGATIVE
Protein, ur: NEGATIVE mg/dL
Specific Gravity, Urine: 1.006 (ref 1.005–1.030)
pH: 5 (ref 5.0–8.0)

## 2022-11-23 LAB — CBC WITH DIFFERENTIAL/PLATELET
Abs Immature Granulocytes: 0.02 10*3/uL (ref 0.00–0.07)
Basophils Absolute: 0 10*3/uL (ref 0.0–0.1)
Basophils Relative: 1 %
Eosinophils Absolute: 0 10*3/uL (ref 0.0–0.5)
Eosinophils Relative: 0 %
HCT: 38.6 % (ref 36.0–46.0)
Hemoglobin: 12.9 g/dL (ref 12.0–15.0)
Immature Granulocytes: 0 %
Lymphocytes Relative: 22 %
Lymphs Abs: 1.1 10*3/uL (ref 0.7–4.0)
MCH: 35.1 pg — ABNORMAL HIGH (ref 26.0–34.0)
MCHC: 33.4 g/dL (ref 30.0–36.0)
MCV: 104.9 fL — ABNORMAL HIGH (ref 80.0–100.0)
Monocytes Absolute: 0.5 10*3/uL (ref 0.1–1.0)
Monocytes Relative: 10 %
Neutro Abs: 3.3 10*3/uL (ref 1.7–7.7)
Neutrophils Relative %: 67 %
Platelets: 148 10*3/uL — ABNORMAL LOW (ref 150–400)
RBC: 3.68 MIL/uL — ABNORMAL LOW (ref 3.87–5.11)
RDW: 14.3 % (ref 11.5–15.5)
WBC: 4.9 10*3/uL (ref 4.0–10.5)
nRBC: 0 % (ref 0.0–0.2)

## 2022-11-23 LAB — COMPREHENSIVE METABOLIC PANEL
ALT: 87 U/L — ABNORMAL HIGH (ref 0–44)
AST: 74 U/L — ABNORMAL HIGH (ref 15–41)
Albumin: 3.1 g/dL — ABNORMAL LOW (ref 3.5–5.0)
Alkaline Phosphatase: 105 U/L (ref 38–126)
Anion gap: 11 (ref 5–15)
BUN: 16 mg/dL (ref 8–23)
CO2: 25 mmol/L (ref 22–32)
Calcium: 8.6 mg/dL — ABNORMAL LOW (ref 8.9–10.3)
Chloride: 103 mmol/L (ref 98–111)
Creatinine, Ser: 1.2 mg/dL — ABNORMAL HIGH (ref 0.44–1.00)
GFR, Estimated: 49 mL/min — ABNORMAL LOW (ref >60)
Glucose, Bld: 93 mg/dL (ref 70–99)
Potassium: 4.7 mmol/L (ref 3.5–5.1)
Sodium: 139 mmol/L (ref 135–145)
Total Bilirubin: 1.5 mg/dL — ABNORMAL HIGH (ref 0.3–1.2)
Total Protein: 6.1 g/dL — ABNORMAL LOW (ref 6.5–8.1)

## 2022-11-23 LAB — PHOSPHORUS: Phosphorus: 4.7 mg/dL — ABNORMAL HIGH (ref 2.5–4.6)

## 2022-11-23 LAB — MAGNESIUM: Magnesium: 1.8 mg/dL (ref 1.7–2.4)

## 2022-11-23 MED ORDER — EMPAGLIFLOZIN 10 MG PO TABS
10.0000 mg | ORAL_TABLET | Freq: Every day | ORAL | Status: DC
  Administered 2022-11-24 – 2022-11-27 (×4): 10 mg via ORAL
  Filled 2022-11-23 (×4): qty 1

## 2022-11-23 MED ORDER — METOPROLOL SUCCINATE ER 25 MG PO TB24
25.0000 mg | ORAL_TABLET | Freq: Every day | ORAL | Status: DC
  Administered 2022-11-23 – 2022-11-24 (×2): 25 mg via ORAL
  Filled 2022-11-23 (×2): qty 1

## 2022-11-23 MED ORDER — DAPAGLIFLOZIN PROPANEDIOL 10 MG PO TABS
10.0000 mg | ORAL_TABLET | Freq: Every day | ORAL | Status: DC
  Administered 2022-11-23: 10 mg via ORAL
  Filled 2022-11-23: qty 1

## 2022-11-23 MED ORDER — BENZONATATE 100 MG PO CAPS
100.0000 mg | ORAL_CAPSULE | Freq: Three times a day (TID) | ORAL | Status: DC | PRN
  Administered 2022-11-23 (×2): 100 mg via ORAL
  Filled 2022-11-23 (×2): qty 1

## 2022-11-23 MED FILL — Verapamil HCl IV Soln 2.5 MG/ML: INTRAVENOUS | Qty: 2 | Status: AC

## 2022-11-23 NOTE — Progress Notes (Signed)
   Heart Failure Stewardship Pharmacist Progress Note   PCP: Toma Deiters, MD PCP-Cardiologist: None    HPI:  67 yo F with PMH of HTN, anxiety, GERD, IBS, and tachycardia.   Presented to the ED on 4/12 with R arm weakness and LE weakness. CT head and MRI with no acute intracranial findings. R sided weakness believed due to hypokalemia from chronic diarrhea. CXR revealed mild cardiomegaly with pulmonary edema. ECHO showed severe biventricular failure with LVEF 25-30% and severe RV dysfunction. Taken for [facility] on 4/16 and found to have mild nonobstructive CAD. RA 16, PA 35, wedge 24, CO 4.1, CI 2.6. Patient not agreeable to MRI.  Current HF Medications: Diuretic: furosemide 40 mg IV BID Beta Blocker: metoprolol XL 25 mg daily MRA: spironolactone 12.5 mg daily SGLT2i: Farxiga 10 mg daily  Prior to admission HF Medications: None  Pertinent Lab Values: Serum creatinine 1.20, BUN 16, Potassium 4.7, Sodium 139, BNP 1370.1, Magnesium 1.8   Vital Signs: Weight: 132 lbs (admission weight: 139 lbs) Blood pressure: 110/70s  Heart rate: 100s  I/O: incomplete  Medication Assistance / Insurance Benefits Check: Does the patient have prescription insurance?  Yes Type of insurance plan: [employer] - commercial plan  Outpatient Pharmacy:  [facility] Is the patient willing to use [facility] T[facility] pharmacy at discharge? Yes Is the patient willing to transition their outpatient pharmacy to utilize a [facility] outpatient pharmacy?   No - [facility] may not be contracted on plan - insurance is stating patient is 100% copayment when test billing under [facility]    Assessment: 1. 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 NYHA class III symptoms. - Continue furosemide 40 mg IV BID. Strict I/Os and daily weights. Keep K>4 and Mg>2. - Agree with starting metoprolol XL 25 mg daily - Consider starting ARB/ARNI once creatinine stable - Agree with starting SGLT2i today. Marcelline Deist  is not on her insurance formulary. Recommend switching to Jardiance 10 mg daily.   Plan: 1) Medication changes recommended at this time: - Switch Farxiga to Jardiance 10 mg daily  2) Patient assistance: - Prior authorization required for Jardiance, Marcelline Deist is not on formulary - [facility] may be out of network with her insurance, test claims reporting as patient responsible for 100% copayment.  3)  Education  - To be completed prior to discharge  Sharen Hones, PharmD, BCPS Heart Failure Stewardship Pharmacist Phone [phone]

## 2022-11-23 NOTE — TOC Initial Note (Signed)
Transition of Care [facility]) - Initial/Assessment Note    Patient Details  Name: Kathleen Velazquez MRN: [id number] Date of Birth: 12/06/1955  Transition of Care [facility]) CM/SW Contact:    Gala Lewandowsky, RN Phone Number: 11/23/2022, 3:38 PM  Clinical Narrative: Patient presented for weakness. Patient is from home alone and has support of daughter. Case Manager discussed home health with the patient and she has declined services and DME 3n1. Patient states she has DME at home. Case Manager has made the MD aware. No further needs identified.                 Expected Discharge Plan: Home/Self Care Barriers to Discharge: No Barriers Identified   Patient Goals and CMS Choice Patient states their goals for this hospitalization and ongoing recovery are:: to return home without services.   Choice offered to / list presented to : NA      Expected Discharge Plan and Services In-house Referral: NA Discharge Planning Services: CM Consult Post Acute Care Choice: NA Living arrangements for the past 2 months: Single Family Home  HH Arranged: Refused HH  Prior Living Arrangements/Services Living arrangements for the past 2 months: Single Family Home Lives with:: Self Patient language and need for interpreter reviewed:: Yes Do you feel safe going back to the place where you live?: Yes      Need for Family Participation in Patient Care: No (Comment) Care giver support system in place?: No (comment)   Criminal Activity/Legal Involvement Pertinent to Current Situation/Hospitalization: No - Comment as needed  Activities of Daily Living Home Assistive Devices/Equipment: Cane (specify quad or straight), Walker (specify type) ADL Screening (condition at time of admission) Patient's cognitive ability adequate to safely complete daily activities?: Yes Is the patient deaf or have difficulty hearing?: No Does the patient have difficulty seeing, even when wearing glasses/contacts?: No Does the  patient have difficulty concentrating, remembering, or making decisions?: No Patient able to express need for assistance with ADLs?: Yes Does the patient have difficulty dressing or bathing?: Yes Independently performs ADLs?: No Communication: Independent Dressing (OT): Needs assistance Is this a change from baseline?: Change from baseline, expected to last >3 days Grooming: Needs assistance Is this a change from baseline?: Change from baseline, expected to last >3 days Feeding: Independent Bathing: Needs assistance Is this a change from baseline?: Change from baseline, expected to last >3 days Toileting: Needs assistance Is this a change from baseline?: Change from baseline, expected to last >3days In/Out Bed: Needs assistance Is this a change from baseline?: Change from baseline, expected to last >3 days Walks in Home: Needs assistance Is this a change from baseline?: Change from baseline, expected to last >3 days Does the patient have difficulty walking or climbing stairs?: Yes Weakness of Legs: Right Weakness of Arms/Hands: Right  Permission Sought/Granted Permission sought to share information with : Family Supports, Case Manager    Emotional Assessment Appearance:: Appears stated age Attitude/Demeanor/Rapport: Engaged Affect (typically observed): Appropriate Orientation: : Oriented to Self, Oriented to Place, Oriented to  Time Alcohol / Substance Use: Not Applicable Psych Involvement: No (comment)  Admission diagnosis:  Hypokalemia [E87.6] Right sided weakness [R53.1] Patient Active Problem List   Diagnosis Date Noted   Acute combined systolic and diastolic heart failure 11/21/2022   Right ventricular failure 11/21/2022   Gastrointestinal hemorrhage associated with anorectal source 11/21/2022   Hyperlipidemia 11/19/2022   Right sided weakness 11/18/2022   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   Abnormal CT of the  abdomen 08/10/2016   Hypokalemia 07/27/2016   Generalized weakness 07/27/2016   Hypomagnesemia 07/27/2016   SVT (supraventricular tachycardia) 07/27/2016   Diarrhea 07/27/2016   IBS (irritable bowel syndrome) 07/27/2016   GERD (gastroesophageal reflux disease)    Headache(784.0)    Arthritis    Anxiety    Ejection fraction    Sinus tachycardia    Palpitations 01/16/2012   Postop Acute blood loss anemia 11/29/2011   OA (osteoarthritis) of knee 11/28/2011   PCP:  Toma Deiters, MD Pharmacy:   [phone] [place] [place] - [address] AT [facility] [address] [place] [address] Phone: [phone] Fax: [phone]  [facility] [address] [place] [address] Phone: [phone] Fax: [phone]   Social Determinants of Health (SDOH) Social History: SDOH Screenings   Food Insecurity: No Food Insecurity (11/19/2022)  Housing: Low Risk  (11/23/2022)  Transportation Needs: No Transportation Needs (11/23/2022)  Utilities: Not At Risk (11/19/2022)  Alcohol Screen: Low Risk  (11/23/2022)  Financial Resource Strain: Low Risk  (11/23/2022)  Tobacco Use: Low Risk  (11/23/2022)   SDOH Interventions: Housing Interventions: Intervention Not Indicated Transportation Interventions: Intervention Not Indicated Alcohol Usage Interventions: Intervention Not Indicated (Score <7) Financial Strain Interventions: Intervention Not Indicated   Readmission Risk Interventions     No data to display

## 2022-11-23 NOTE — Progress Notes (Signed)
[facility]                                                                               Kathleen Velazquez, is a 67 y.o. female, DOB - March 19, 1956, ZOX:[id number] Admit date - 11/18/2022    Outpatient Primary MD for the patient is Kathleen Deiters, MD  LOS - 4  days    Brief summary    Kathleen Velazquez is a 67 y.o. female with a hx of anxiety, GERD, HTN, IBS, and sinus tachycardia presents with right sided weakness.   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  - she was started on IV lasix 40 mg BID, spironolactone 12.5 mg daily.  - continue with strict intake and output. Daily weights.  - echocardiogram showed there is  severe biventricular failure with estimated LVEF ~25%  and severe RV dysfunction.  FARXIGA 10 mg daily added to her regimen.    * Right sided weakness CT and MRI of the brain negative for acute pathology and stroke.  Neurology on board and suggested possibly from hypokalemia.  Therapy evaluations ordered.   Hyperlipidemia - Continue home gemfibrozil  IBS (irritable bowel syndrome) Stable.   Hypokalemia Replaced .   Sinus tachycardia - Continue home metoprolol  Non obstructive CAD;     Estimated body mass index is 24.19 kg/m as calculated from the following:   Height as of this encounter:  (1.575 m).   Weight as of this encounter: 60 kg.  Code Status: full code.  DVT Prophylaxis:  SCD's Start: 11/22/22 1739 SCDs Start: 11/19/22 0305   Level of Care: Level of care: Progressive Family Communication: none at bedside.   Disposition Plan:     Remains inpatient appropriate:  IV diuresis.   Procedures:  Cardiac cath on 4/16  Consultants:   Cardiology.   Antimicrobials:   Anti-infectives (From admission, onward)    None        Medications  Scheduled Meds:   dapagliflozin propanediol  10 mg Oral Daily   furosemide  40 mg Intravenous Q12H   gemfibrozil  600 mg Oral BID AC   melatonin  3 mg Oral QHS   metoprolol succinate  25 mg Oral Daily   sodium chloride flush  3 mL Intravenous Q12H   spironolactone  12.5 mg Oral Daily   Continuous Infusions:  sodium chloride     PRN Meds:.sodium chloride, acetaminophen **OR** acetaminophen, benzonatate, loperamide, oxyCODONE, sodium chloride flush    Subjective:   Kathleen Velazquez was seen and examined today.   FEELS LIKE A WEIGHT HAS BEEN OFF HER CHEST.  She denies any chest pain or sob. No nausea or vomiting.   Objective:   Vitals:   11/22/22 2100 11/23/22 0405 11/23/22 0812 11/23/22 1345  BP: (!) 137/96 114/61 109/67 113/73  Pulse: 92 92    Resp:   16 16  Temp:  98 F (36.7 C)    TempSrc:  Oral    SpO2: 95% 97%  95%  Weight:  Height:        Intake/Output Summary (Last 24 hours) at 11/23/2022 1411 Last data filed at 11/23/2022 0510 Gross per 24 hour  Intake --  Output 1100 ml  Net -1100 ml   Filed Weights   11/18/22 1911 11/19/22 0245  Weight: 63.5 kg 60 kg     Exam General: Alert and oriented x 3, NAD Cardiovascular: S1 S2 auscultated, no murmurs, RRR Respiratory: Clear to auscultation bilaterally, no wheezing, rales or rhonchi Gastrointestinal: Soft, nontender, nondistended, + bowel sounds Ext: no pedal edema bilaterally Neuro: AAOx3, Cr N's II- XII. Strength 5/5 upper and lower extremities bilaterally Skin: No rashes Psych: Normal affect and demeanor, alert and oriented x3    Data Reviewed:  I have personally reviewed following labs and imaging studies   CBC Lab Results  Component Value Date   WBC 4.9 11/23/2022   RBC 3.68 (L) 11/23/2022   HGB 12.9 11/23/2022   HCT 38.6 11/23/2022   MCV 104.9 (H) 11/23/2022   MCH 35.1 (H) 11/23/2022   PLT 148 (L) 11/23/2022   MCHC 33.4 11/23/2022   RDW 14.3 11/23/2022   LYMPHSABS 1.1 11/23/2022   MONOABS 0.5 11/23/2022    EOSABS 0.0 11/23/2022   BASOSABS 0.0 11/23/2022     Last metabolic panel Lab Results  Component Value Date   NA 139 11/23/2022   K 4.7 11/23/2022   CL 103 11/23/2022   CO2 25 11/23/2022   BUN 16 11/23/2022   CREATININE 1.20 (H) 11/23/2022   GLUCOSE 93 11/23/2022   GFRNONAA 49 (L) 11/23/2022   GFRAA >60 02/28/2020   CALCIUM 8.6 (L) 11/23/2022   PHOS 4.7 (H) 11/23/2022   PROT 6.1 (L) 11/23/2022   ALBUMIN 3.1 (L) 11/23/2022   BILITOT 1.5 (H) 11/23/2022   ALKPHOS 105 11/23/2022   AST 74 (H) 11/23/2022   ALT 87 (H) 11/23/2022   ANIONGAP 11 11/23/2022    CBG (last 3)  No results for input(s): "GLUCAP" in the last 72 hours.    Coagulation Profile: Recent Labs  Lab 11/21/22 0938  INR 1.2     Radiology Studies: CARDIAC CATHETERIZATION  Result Date: 11/22/2022   Mid LAD lesion is 20% stenosed.   Mid Cx lesion is 20% stenosed. Mild non-obstructive disease in the mid Circumflex and mid LAD Moderate caliber non-dominant RCA with no disease. Prominent Thebesian vein network noted on angiography Elevated right and left heart pressures Fick Cardiac Output 4.1 L/min Cardiac index 2.58 Recommendations: GDMT for heart failure. She has received IV Lasix this am. I will give another 40 mg of IV Lasix tonight. Medical management of mild CAD.   NM Pulmonary Perfusion  Result Date: 11/21/2022 CLINICAL DATA:  Pulmonary embolism suspected. High probability. Sinus tachycardia. Right-sided weakness. EXAM: NUCLEAR MEDICINE PERFUSION LUNG SCAN TECHNIQUE: Perfusion images were obtained in multiple projections after intravenous injection of radiopharmaceutical. Ventilation scans intentionally deferred if perfusion scan and chest x-ray adequate for interpretation during COVID 19 epidemic. RADIOPHARMACEUTICALS:  3.6 mCi Tc-29m MAA IV COMPARISON:  No prior V/Q scan available for comparison; correlation is made with chest radiographs 11/21/2022, 11/19/2022, 07/30/2016 FINDINGS: There is normal homogeneous  distribution of radiotracer. No large segmental defect is seen. IMPRESSION: Normal perfusion scan (pulmonary embolism absent) by perfusion only modified PIOPED II criteria. Electronically Signed   By: Neita Garnet M.D.   On: 11/21/2022 16:08       [name] M.D. [facility] 11/23/2022, 2:11 PM  Available via Epic secure chat 7am-7pm After 7 pm, please refer  to night coverage provider listed on amion.

## 2022-11-23 NOTE — Progress Notes (Signed)
    Heart Failure Stewardship Pharmacist Progress Note   Received notification from [place] that prior authorization for [place] [employer] is required.   PA submitted on CoverMyMeds Status is approved   Sharen Hones, PharmD, BCPS Heart Failure Stewardship Pharmacist Phone [phone]

## 2022-11-23 NOTE — Progress Notes (Signed)
Daily Progress Note   Patient Name: Kathleen Velazquez       Date: 11/23/2022 DOB: 08/18/1955  Age: 67 y.o. MRN#: [id number] Attending Physician: [name], MD Primary Care Physician: Toma Deiters, MD Admit Date: 11/18/2022 Length of Stay: 4 days  Reason for Consultation/Follow-up: Establishing goals of care  HPI/Patient Profile:  67 y.o. female  with past medical history of anxiety, GERD, hypertension, sinus tachycardia, IBS with chronic diarrhea who presented to the emergency department with complaint of right-sided weakness, difficulty in ambulation.  Stroke was suspected on admission.  CT head did not show any acute findings. She admitted on 11/18/2022 with right-sided weakness, severe anemia, chronic diarrhea, prolonged QT (likely due to hypokalemia).    Further workup found severe new onset HFrEF with EF 25 to 30%.   PMT was consulted for GOC conversations.  Subjective:   Subjective: Chart Reviewed. Updates received. Patient Assessed. Created space and opportunity for patient  and family to explore thoughts and feelings regarding current medical situation.  Today's Discussion: Today saw the patient at bedside, no family was present.  She states she is having a rough day.  She tried to get some sleep but woke up coughing a lot.  She has had persistent cough since and it is giving her headache.  I offered cough medicine and/or Tylenol which she declined.  Generally she is not having any pain other than her headache.  Her breathing is doing okay today.  We discussed her heart cath yesterday and she states it went well.  I gave her medication to help her relax which she is thankful for.  She states they came in asking today about cardiac MRI and she is refusing MRI.  She states she is agreeable to other medications and treatments.  We discussed the importance of taking medications to try to improve her heart failure.  She verbalized understanding.  We talked about her grandchildren able  to come visit yesterday.  This really made her today and she is hopeful they will be able to come back today or tomorrow.  We discussed again about compliance with medical recommendations to help fulfill her goal of "being around as long as I can for my grandchildren" and she agreed with this.  I provided emotional and general support through therapeutic listening, empathy, sharing of stories, therapeutic touch, and other techniques. I answered all questions and addressed all concerns to the best of my ability.  Review of Systems  Constitutional:  Positive for fatigue.  HENT:         Headache  Respiratory:  Positive for cough. Negative for shortness of breath.   Cardiovascular:  Negative for chest pain.  Gastrointestinal:  Negative for abdominal pain, nausea and vomiting.    Objective:   Vital Signs:  BP 113/73 (BP Location: Left Arm)   Pulse 92   Temp 98 F (36.7 C) (Oral)   Resp 16   Ht  (1.575 m)   Wt 60 kg   SpO2 95%   BMI 24.19 kg/m   Physical Exam: Physical Exam Vitals and nursing note reviewed.  Constitutional:      General: She is not in acute distress.    Appearance: She is ill-appearing.  HENT:     Head: Normocephalic and atraumatic.  Cardiovascular:     Rate and Rhythm: Normal rate.  Pulmonary:     Effort: Pulmonary effort is normal. No respiratory distress.     Breath sounds: No wheezing or rhonchi.  Comments: Noted intermittent cough during my visit Abdominal:     General: Abdomen is flat. Bowel sounds are normal. There is no distension.     Palpations: Abdomen is soft.     Tenderness: There is no abdominal tenderness.  Skin:    General: Skin is warm and dry.  Neurological:     General: No focal deficit present.     Mental Status: She is alert.  Psychiatric:        Mood and Affect: Mood normal.        Behavior: Behavior normal.     Palliative Assessment/Data: 60%    Existing Vynca/ACP Documentation: None  Assessment & Plan:    Impression: Present on Admission:  Hypokalemia  IBS (irritable bowel syndrome)  Sinus tachycardia  SUMMARY OF RECOMMENDATIONS   Remain full code Refusing cardiac MRI Full scope of care otherwise Continued emotional support of patient and family Continued reminder of her "why" for treatment: Her grandchildren PMT will continue to follow  Symptom Management:  Per primary team PMT is available to assist as needed  Code Status: Full code  Prognosis: Unable to determine  Discharge Planning: To Be Determined  Discussed with: Patient, medical team, nursing team  Thank you for allowing [place] to participate in the care of [name] PMT will continue to support holistically.  Billing based on MDM: High  Problems Addressed: One acute or chronic illness or injury that poses a threat to life or bodily function  Amount and/or Complexity of Data: Category 3:Discussion of management or test interpretation with external physician/other qualified health care professional/appropriate source (not separately reported)  Risks: N/A    Wynne Dust, NP Palliative Medicine Team  Team Phone # [phone] (Nights/Weekends)  04/06/2021, 8:17 AM

## 2022-11-23 NOTE — Progress Notes (Signed)
Rounding Note    Patient Name: Kathleen Velazquez Date of Encounter: 11/23/2022  [facility] Cardiologist: None   Subjective   Patient reports that she feels okay this morning. Denies chest pain, palpitations. Continues to feel somewhat dyspneic in supine position. Is adamant that she does not want cardiac MRI.   Inpatient Medications    Scheduled Meds:  furosemide  40 mg Intravenous Q12H   gemfibrozil  600 mg Oral BID AC   melatonin  3 mg Oral QHS   sodium chloride flush  3 mL Intravenous Q12H   spironolactone  12.5 mg Oral Daily   Continuous Infusions:  sodium chloride     PRN Meds: sodium chloride, acetaminophen **OR** acetaminophen, loperamide, oxyCODONE, sodium chloride flush   Vital Signs    Vitals:   11/22/22 1843 11/22/22 2100 11/23/22 0405 11/23/22 0812  BP: (!) 128/91 (!) 137/96 114/61 109/67  Pulse: 89 92 92   Resp:    16  Temp:   98 F (36.7 C)   TempSrc:   Oral   SpO2: 100% 95% 97%   Weight:      Height:        Intake/Output Summary (Last 24 hours) at 11/23/2022 0835 Last data filed at 11/23/2022 0510 Gross per 24 hour  Intake --  Output 1101 ml  Net -1101 ml      11/19/2022    2:45 AM 11/18/2022    7:11 PM 02/26/2020   11:52 AM  Last 3 Weights  Weight (lbs) 132 lb 4.4 oz 139 lb 15.9 oz 139 lb 15.9 oz  Weight (kg) 60 kg 63.5 kg 63.5 kg      Telemetry    Sinus arrythmia/sinus tachycardia with PACs - Personally Reviewed  ECG    No new tracing - Personally Reviewed  Physical Exam   GEN: No acute distress.   Neck: JVP remains elevated midway to mandible, improving Cardiac: slightly irregular, tachycardic, no murmurs, rubs, or gallops.  Respiratory: Poor inspiratory effort. No rales/crackles noted but bilateral bases are diminished. GI: Soft, nontender, non-distended  MS: No edema; No deformity. Neuro:  Nonfocal  Psych: Normal affect   Labs    High Sensitivity Troponin:  No results for input(s): "TROPONINIHS" in the last  720 hours.   Chemistry Recent Labs  Lab 11/21/22 0351 11/22/22 0249 11/22/22 1702 11/22/22 1704 11/22/22 1707 11/23/22 0215  NA 139 136   < > 140 140 139  K 4.2 3.5   < > 4.7 4.5 4.7  CL 105 106  --   --   --  103  CO2 21* 19*  --   --   --  25  GLUCOSE 136* 112*  --   --   --  93  BUN 14 12  --   --   --  16  CREATININE 1.49* 1.16*  --   --   --  1.20*  CALCIUM 8.3* 8.3*  --   --   --  8.6*  MG 2.0 2.1  --   --   --  1.8  PROT 5.5* 5.6*  --   --   --  6.1*  ALBUMIN 2.8* 2.9*  --   --   --  3.1*  AST 108* 63*  --   --   --  74*  ALT 88* 77*  --   --   --  87*  ALKPHOS 78 92  --   --   --  105  BILITOT  1.7* 1.3*  --   --   --  1.5*  GFRNONAA 38* 51*  --   --   --  49*  ANIONGAP 13 11  --   --   --  11   < > = values in this interval not displayed.    Lipids No results for input(s): "CHOL", "TRIG", "HDL", "LABVLDL", "LDLCALC", "CHOLHDL" in the last 168 hours.  Hematology Recent Labs  Lab 11/21/22 0351 11/21/22 1322 11/22/22 0249 11/22/22 1702 11/22/22 1704 11/22/22 1707 11/23/22 0215  WBC 6.9  --  5.4  --   --   --  4.9  RBC 3.28*  --  3.36*  --   --   --  3.68*  HGB 11.2*   < > 11.5*   < > 11.9* 10.9* 12.9  HCT 35.1*   < > 35.4*   < > 35.0* 32.0* 38.6  MCV 107.0*  --  105.4*  --   --   --  104.9*  MCH 34.1*  --  34.2*  --   --   --  35.1*  MCHC 31.9  --  32.5  --   --   --  33.4  RDW 14.5  --  14.3  --   --   --  14.3  PLT 109*  --  117*  --   --   --  148*   < > = values in this interval not displayed.   Thyroid  Recent Labs  Lab 11/19/22 0345 11/19/22 0941  TSH 6.980*  --   FREET4  --  0.71    BNP Recent Labs  Lab 11/20/22 1651  BNP 1,370.1*    DDimer  Recent Labs  Lab 11/20/22 1651  DDIMER 2.94*     Radiology    CARDIAC CATHETERIZATION  Result Date: 11/22/2022   Mid LAD lesion is 20% stenosed.   Mid Cx lesion is 20% stenosed. Mild non-obstructive disease in the mid Circumflex and mid LAD Moderate caliber non-dominant RCA with no disease.  Prominent Thebesian vein network noted on angiography Elevated right and left heart pressures Fick Cardiac Output 4.1 L/min Cardiac index 2.58 Recommendations: GDMT for heart failure. She has received IV Lasix this am. I will give another 40 mg of IV Lasix tonight. Medical management of mild CAD.   NM Pulmonary Perfusion  Result Date: 11/21/2022 CLINICAL DATA:  Pulmonary embolism suspected. High probability. Sinus tachycardia. Right-sided weakness. EXAM: NUCLEAR MEDICINE PERFUSION LUNG SCAN TECHNIQUE: Perfusion images were obtained in multiple projections after intravenous injection of radiopharmaceutical. Ventilation scans intentionally deferred if perfusion scan and chest x-ray adequate for interpretation during COVID 19 epidemic. RADIOPHARMACEUTICALS:  3.6 mCi Tc-19m MAA IV COMPARISON:  No prior V/Q scan available for comparison; correlation is made with chest radiographs 11/21/2022, 11/19/2022, 07/30/2016 FINDINGS: There is normal homogeneous distribution of radiotracer. No large segmental defect is seen. IMPRESSION: Normal perfusion scan (pulmonary embolism absent) by perfusion only modified PIOPED II criteria. Electronically Signed   By: Neita Garnet M.D.   On: 11/21/2022 16:08   DG CHEST PORT 1 VIEW  Result Date: 11/21/2022 CLINICAL DATA:  Weakness.  Foot swelling. EXAM: PORTABLE CHEST 1 VIEW COMPARISON:  Radiographs 11/19/2022 and 07/30/2016.  CT 07/29/2016. FINDINGS: 0950 hours. Stable cardiomegaly, vascular congestion and bilateral pleural effusions. There is associated dependent atelectasis at both lung bases. No pneumothorax. The bones appear unchanged. Telemetry leads overlie the chest. IMPRESSION: Cardiomegaly, vascular congestion and bilateral pleural effusions consistent with congestive heart failure. No significant change  from previous study of 2 days prior. Electronically Signed   By: William  Veazey M.D.   On: 11/21/2022 10:16    Cardiac Studies   TTE 11/20/22: IMPRESSIONS     1.  Very difficult study due to poor echo windows and lack of  visualization of LV endocardium. However, based on limited views, there is  severe biventricular failure with estimated LVEF ~25% (endocardium very  poorly visualized) and severe RV dysfunction.   2. Left ventricular ejection fraction, by estimation, is 25 to 30%. The  left ventricle has severely decreased function. Left ventricular  endocardial border not optimally defined to evaluate regional wall motion.  There is mild left ventricular  hypertrophy. Left ventricular diastolic parameters are consistent with  Grade II diastolic dysfunction (pseudonormalization).   3. Right ventricular systolic function is severely reduced. The right  ventricular size is mildly enlarged.   4. Left atrial size was mildly dilated.   5. The mitral valve is degenerative. Mild to moderate mitral valve  regurgitation. Moderate mitral annular calcification.   6. The aortic valve is tricuspid. There is mild calcification of the  aortic valve. There is mild thickening of the aortic valve. Aortic valve  regurgitation is not visualized. Aortic valve sclerosis/calcification is  present, without any evidence of  aortic stenosis.   7. The inferior vena cava is dilated in size with <50% respiratory  variability, suggesting right atrial pressure of 15 mmHg.   8. Moderate pericardial effusion. There is no evidence of cardiac  tamponade.   Comparison(s): No prior Echocardiogram.    11/22/22 LHC/RHC    Mid LAD lesion is 20% stenosed.   Mid Cx lesion is 20% stenosed.   Mild non-obstructive disease in the mid Circumflex and mid LAD Moderate caliber non-dominant RCA with no disease.  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 She has received IV Lasix this am. I will give another 40 mg of IV Lasix tonight. Medical  management of mild CAD.   Patient Profile     Kathleen Velazquez is a 67 y.o. female with a hx of anxiety, GERD, HTN, IBS, and sinus tachycardia who is being seen for the evaluation of severe biventricular failure and severe RV dysfunction at the request of Dr. Joseph Art.   Assessment & Plan    Severe Biventricular HF   TTE with LVEF 25-30%, severe RV dysfunction which is newly diagnosed. BNP 1370.1. ECG with NSR, PVCs, TWI in anterolateral leads but these are chronic. Etiology of BiV failure unknown with no history of ischemic heart disease or known cardiomyopathy. LHC/RHC with mild non-obstructive CAD, low normal cardiac output (Fick 4.1L/min, CI 2.58).   Patient s/p IV lasix  x2 doses yesterday and creatinine has remained stable with increased dosing. Will continue today.  Though patient is not low output, will hold beta blockers with active diuresis.  Patient tolerating spironolactone. Will defer ACEi/ARB/ARNI at this time with creatinine elevation.  Could consider SGLT2i at discharge. Given lack of obstructive disease on LHC, need to consider further evaluation with cardiac MRI. This morning patient tells me that she would continue to decline this imaging (has been against MRI due to a "bad experience" and anxiety with brain MRI earlier this admission).    Elevated D-Dimer   No known history of PE but D-dimer elevated to 2.94 in setting of severe [place]on.    V/Q  scan without PE   Hypokalemia   Occurred in the setting of diarrhea with K as low as 2.1 which has improved with supplementation and spironolactone. Continue to monitor/replace to maintain K>4.   Sinus tachycardia   Continue to hold beta blocker to allow diuresis   Frequent PVCs   Patient continues with intermittent PVCs and PACs. Would plan to resume beta blocker with clinical improvement/volume reduction.     For questions or updates, please contact [facility] Please consult [email or web address] for  contact info under        Signed, Perlie Gold, PA-C  11/23/2022, 8:35 AM

## 2022-11-23 NOTE — Progress Notes (Signed)
Heart Failure Nurse Navigator Progress Note  PCP: Toma Deiters, MD PCP-Cardiologist: None Admission Diagnosis: Hypokalemia,  [facility]: [facility] Via EMS  Presentation:   [name] presented with right arm and bilateral leg weakness. BP 117/64, HR 90, CT head unremarkable and MRI brain with no acute pathology. CXR mild cardiomegaly with pulmonary edema, TEE with LVEF 25-30% , severe RV dysfunction (New) BNP 1370.   Patient was educated on the sign and symptoms of heart failure, daily weights, when to call her doctor or go to the ED, Diet/ fluid restrictions, patient reported to eating can goods daily and a pepsi every day, Spoke about taking all medication as prescribed, patient reported to having a pill box she often forgets to take them. Also educated on the importance of attending all her medical appointments, she stated that her daughter would bring her to her HF [facility] appointment that is scheduled on 12/14/2022 @ 9 am. Patient verbalized her understanding of education.   ECHO/ LVEF: 25-30% HFrEF  Clinical Course:  Past Medical History:  Diagnosis Date   Anxiety    Arthritis    knees,    Ejection fraction    Normal, echo, June, 2013   GERD (gastroesophageal reflux disease)    Headache(784.0)    hx of migraines    History of hiatal hernia    Hypertension    Irritable bowel syndrome (IBS)    PONV (postoperative nausea and vomiting)    Sinus tachycardia    Persistent sinus tachycardia, June, 2013 ( prior monitor in 2007 had shown no significant abnormalities.)     Social History   Socioeconomic History   Marital status: Widowed    Spouse name: Not on file   Number of children: 1   Years of education: Not on file   Highest education level: Not on file  Occupational History   Occupation: Retired  Tobacco Use   Smoking status: Never   Smokeless tobacco: Never  Vaping Use   Vaping Use: Never used  Substance and Sexual Activity   Alcohol use: Not  Currently    Comment: recovering alcoholic x 7 years    Drug use: No   Sexual activity: Not Currently    Birth control/protection: Post-menopausal  Other Topics Concern   Not on file  Social History Narrative   Not on file   Social Determinants of Health   Financial Resource Strain: Low Risk  (11/23/2022)   Overall Financial Resource Strain (CARDIA)    Difficulty of Paying Living Expenses: Not very hard  Food Insecurity: No Food Insecurity (11/19/2022)   Hunger Vital Sign    Worried About Running Out of Food in the Last Year: Never true    Ran Out of Food in the Last Year: Never true  Transportation Needs: No Transportation Needs (11/23/2022)   PRAPARE - Administrator, Civil Service (Medical): No    Lack of Transportation (Non-Medical): No  Physical Activity: Not on file  Stress: Not on file  Social Connections: Not on file   Education Assessment and Provision:  Detailed education and instructions provided on heart failure disease management including the following:  Signs and symptoms of Heart Failure When to call the physician Importance of daily weights Low sodium diet Fluid restriction Medication management Anticipated future follow-up appointments  Patient education given on each of the above topics.  Patient acknowledges understanding via teach back method and acceptance of all instructions.  Education Materials:  "Living Better With  Heart Failure" Booklet, HF zone tool, & Daily Weight Tracker Tool.  Patient has scale at home: Yes Patient has pill box at home: Yes    High Risk Criteria for Readmission and/or Poor Patient Outcomes: Heart failure hospital admissions (last 6 months): 0  No Show rate: 0 Difficult social situation: No, lives alone Demonstrates medication adherence: No, reported she often forgets to take them .  Primary Language: English Literacy level: Reading, writing, and comprehension.   Barriers of Care:   Medication  compliance Diet/ fluid restrictions ( salt, can goods, soda) Daily weights   Considerations/Referrals:   Referral made to Heart Failure Pharmacist Stewardship: yes Referral made to Heart Failure CSW/NCM TOC: No Referral made to Heart & Vascular TOC clinic: Yes, 12/14/2022 @ 9am  Items for Follow-up on DC/TOC: Medication compliance ( reported she often forgets to take them ) Diet/ fluid restrictions ( salt, can goods, soda) Daily weights Continued HF education   Rhae Hammock, BSN, RN Heart Failure Print production planner Chat Only

## 2022-11-23 NOTE — Progress Notes (Signed)
Mobility Specialist: Progress Note   11/23/22 1709  Mobility  Activity Ambulated with assistance in hallway  Level of Assistance Contact guard assist, steadying assist  Assistive Device Front wheel walker  Distance Ambulated (ft) 470 ft  Activity Response Tolerated well  Mobility Referral Yes  $Mobility charge 1 Mobility   Pre-Mobility: 111 HR Post-Mobility: 114 HR, 94% SpO2  Pt received in the bed and agreeable to mobility. Mod I with bed mobility and contact guard during ambulation. Rt knee buckling x1 after standing but pt able to independently recover. No overt LOB throughout. Stopped x1 for standing break secondary to fatigue. No c/o dizziness, pain,  or SOB. Pt sitting EOB after session with call bell at her side.   Kathleen Velazquez Mobility Specialist Please contact via SecureChat or Rehab office at [phone]

## 2022-11-23 NOTE — Progress Notes (Signed)
Occupational Therapy Treatment Patient Details Name: Kathleen Velazquez MRN: [id number] DOB: 1955-09-20 Today's Date: 11/23/2022   History of present illness Pt is a 67 yo female presenting to ED with reports of R sided weakness. Imaging without acute findings, hospital course complicated by low potassium. PMH: anxiety, GERD, HTN, IBS, sinus tachycardia, arthritis   OT comments  Pt making good progress with functional goals. Pt seemed to be in better spirits today, more talkative, smiling and laughing. Reviewed energy conservation and breathing techniques form handouts provided last session. Pt used RW for functional mobility to walk to bathroom, pt anxious but agreeable and stated that she felt more at ease once she completed toileting tasks and stood at sink for hygiene tasks with no LOB and with O2 SATs 90-92% on RA and HR 61-69. OT will continue to follow acutely to maximize level of function and safety   Recommendations for follow up therapy are one component of a multi-disciplinary discharge planning process, led by the attending physician.  Recommendations may be updated based on patient status, additional functional criteria and insurance authorization.    Assistance Recommended at Discharge Intermittent Supervision/Assistance  Patient can return home with the following  A little help with bathing/dressing/bathroom;Assistance with cooking/housework;Assist for transportation;Help with stairs or ramp for entrance;A little help with walking and/or transfers   Equipment Recommendations  BSC/3in1    Recommendations for Other Services      Precautions / Restrictions Precautions Precautions: Fall Restrictions Weight Bearing Restrictions: No       Mobility Bed Mobility               General bed mobility comments: sitting on EOB at start and end of session    Transfers Overall transfer level: Needs assistance   Transfers: Sit to/from Stand Sit to Stand: Min guard     Step  pivot transfers: Min guard     General transfer comment: Pt used RW for functional mobility to walk to bathroom, pt anxious but agreeable and stated that she felt more at ease once she completed toileting tasks and stood at sink for hygiene tasks with no LOB and with O2 SATs 90-92% on RA and HR 61-69     Balance Overall balance assessment: Needs assistance Sitting-balance support: Bilateral upper extremity supported, Feet supported       Standing balance support: During functional activity, Bilateral upper extremity supported, Single extremity supported Standing balance-Leahy Scale: Poor                             ADL either performed or assessed with clinical judgement   ADL                                         General ADL Comments: Reviewed energy conservation and breathing techniques form handouts provided last session. Pt used RW for functional mobility to walk to bathroom, pt anxious but agreeable and stated that she felt more at ease once she completed toileting tasks and stood at sink for hygiene tasks with no LOB and with O2 SATs 90-92% on RA and HR 61-69    Extremity/Trunk Assessment Upper Extremity Assessment Upper Extremity Assessment: Generalized weakness   Lower Extremity Assessment Lower Extremity Assessment: Defer to PT evaluation   Cervical / Trunk Assessment Cervical / Trunk Assessment: Normal    Vision Baseline Vision/History: 1 Wears  glasses Ability to See in Adequate Light: 0 Adequate Patient Visual Report: No change from baseline     Perception     Praxis      Cognition Arousal/Alertness: Awake/alert Behavior During Therapy: Flat affect, Anxious Overall Cognitive Status: Within Functional Limits for tasks assessed                                 General Comments: Pt reports feeling better but still anxious about using RW to walk to bathroom        Exercises      Shoulder Instructions        General Comments      Pertinent Vitals/ Pain       Pain Assessment Pain Assessment: No/denies pain  Home Living                                          Prior Functioning/Environment              Frequency  Min 1X/week        Progress Toward Goals  OT Goals(current goals can now be found in the care plan section)  Progress towards OT goals: Progressing toward goals     Plan Discharge plan remains appropriate    Co-evaluation                 AM-PAC OT "6 Clicks" Daily Activity     Outcome Measure   Help from another person eating meals?: None Help from another person taking care of personal grooming?: A Little Help from another person toileting, which includes using toliet, bedpan, or urinal?: A Little Help from another person bathing (including washing, rinsing, drying)?: A Lot Help from another person to put on and taking off regular upper body clothing?: A Little Help from another person to put on and taking off regular lower body clothing?: A Lot 6 Click Score: 17    End of Session Equipment Utilized During Treatment: Rolling walker (2 wheels);Gait belt  OT Visit Diagnosis: Muscle weakness (generalized) (M62.81)   Activity Tolerance Patient tolerated treatment well   Patient Left in bed;with call bell/phone within reach   Nurse Communication          Time: 1610-9604 OT Time Calculation (min): 23 min  Charges: OT General Charges $OT Visit: 1 Visit OT Treatments $Self Care/Home Management : 8-22 mins $Therapeutic Activity: 8-22 mins    Galen Manila 11/23/2022, 12:32 PM

## 2022-11-24 ENCOUNTER — Other Ambulatory Visit (HOSPITAL_COMMUNITY): Payer: Self-pay

## 2022-11-24 DIAGNOSIS — Z7189 Other specified counseling: Secondary | ICD-10-CM | POA: Diagnosis not present

## 2022-11-24 DIAGNOSIS — E876 Hypokalemia: Secondary | ICD-10-CM | POA: Diagnosis not present

## 2022-11-24 DIAGNOSIS — I50810 Right heart failure, unspecified: Secondary | ICD-10-CM | POA: Diagnosis not present

## 2022-11-24 DIAGNOSIS — I5041 Acute combined systolic (congestive) and diastolic (congestive) heart failure: Secondary | ICD-10-CM | POA: Diagnosis not present

## 2022-11-24 DIAGNOSIS — Z515 Encounter for palliative care: Secondary | ICD-10-CM | POA: Diagnosis not present

## 2022-11-24 DIAGNOSIS — R531 Weakness: Secondary | ICD-10-CM | POA: Diagnosis not present

## 2022-11-24 LAB — CBC WITH DIFFERENTIAL/PLATELET
Abs Immature Granulocytes: 0.03 10*3/uL (ref 0.00–0.07)
Basophils Absolute: 0 10*3/uL (ref 0.0–0.1)
Basophils Relative: 0 %
Eosinophils Absolute: 0 10*3/uL (ref 0.0–0.5)
Eosinophils Relative: 0 %
HCT: 40.4 % (ref 36.0–46.0)
Hemoglobin: 13.7 g/dL (ref 12.0–15.0)
Immature Granulocytes: 1 %
Lymphocytes Relative: 25 %
Lymphs Abs: 1.6 10*3/uL (ref 0.7–4.0)
MCH: 34.6 pg — ABNORMAL HIGH (ref 26.0–34.0)
MCHC: 33.9 g/dL (ref 30.0–36.0)
MCV: 102 fL — ABNORMAL HIGH (ref 80.0–100.0)
Monocytes Absolute: 0.9 10*3/uL (ref 0.1–1.0)
Monocytes Relative: 13 %
Neutro Abs: 4 10*3/uL (ref 1.7–7.7)
Neutrophils Relative %: 61 %
Platelets: 155 10*3/uL (ref 150–400)
RBC: 3.96 MIL/uL (ref 3.87–5.11)
RDW: 14.2 % (ref 11.5–15.5)
WBC: 6.5 10*3/uL (ref 4.0–10.5)
nRBC: 0 % (ref 0.0–0.2)

## 2022-11-24 LAB — BASIC METABOLIC PANEL
Anion gap: 16 — ABNORMAL HIGH (ref 5–15)
BUN: 25 mg/dL — ABNORMAL HIGH (ref 8–23)
CO2: 28 mmol/L (ref 22–32)
Calcium: 8.7 mg/dL — ABNORMAL LOW (ref 8.9–10.3)
Chloride: 91 mmol/L — ABNORMAL LOW (ref 98–111)
Creatinine, Ser: 1.33 mg/dL — ABNORMAL HIGH (ref 0.44–1.00)
GFR, Estimated: 43 mL/min — ABNORMAL LOW (ref >60)
Glucose, Bld: 103 mg/dL — ABNORMAL HIGH (ref 70–99)
Potassium: 3.8 mmol/L (ref 3.5–5.1)
Sodium: 135 mmol/L (ref 135–145)

## 2022-11-24 LAB — PHOSPHORUS: Phosphorus: 6.4 mg/dL — ABNORMAL HIGH (ref 2.5–4.6)

## 2022-11-24 LAB — MAGNESIUM: Magnesium: 1.5 mg/dL — ABNORMAL LOW (ref 1.7–2.4)

## 2022-11-24 MED ORDER — METOPROLOL SUCCINATE ER 25 MG PO TB24
25.0000 mg | ORAL_TABLET | Freq: Once | ORAL | Status: AC
  Administered 2022-11-24: 25 mg via ORAL
  Filled 2022-11-24: qty 1

## 2022-11-24 MED ORDER — POTASSIUM CHLORIDE CRYS ER 20 MEQ PO TBCR
20.0000 meq | EXTENDED_RELEASE_TABLET | Freq: Once | ORAL | Status: AC
  Administered 2022-11-24: 20 meq via ORAL
  Filled 2022-11-24: qty 1

## 2022-11-24 MED ORDER — POTASSIUM CHLORIDE CRYS ER 20 MEQ PO TBCR: 40.0000 meq | EXTENDED_RELEASE_TABLET | Freq: Once | ORAL | Status: DC

## 2022-11-24 MED ORDER — METOPROLOL SUCCINATE ER 50 MG PO TB24
50.0000 mg | ORAL_TABLET | Freq: Every day | ORAL | Status: DC
  Administered 2022-11-25 – 2022-11-27 (×3): 50 mg via ORAL
  Filled 2022-11-24 (×3): qty 1

## 2022-11-24 MED ORDER — LOSARTAN POTASSIUM 25 MG PO TABS
12.5000 mg | ORAL_TABLET | Freq: Every day | ORAL | Status: DC
  Administered 2022-11-25 – 2022-11-27 (×2): 12.5 mg via ORAL
  Filled 2022-11-24 (×3): qty 1

## 2022-11-24 MED ORDER — FUROSEMIDE 40 MG PO TABS
40.0000 mg | ORAL_TABLET | Freq: Every day | ORAL | Status: DC
  Administered 2022-11-25 – 2022-11-26 (×2): 40 mg via ORAL
  Filled 2022-11-24 (×2): qty 1

## 2022-11-24 MED ORDER — MAGNESIUM SULFATE 2 GM/50ML IV SOLN
2.0000 g | Freq: Once | INTRAVENOUS | Status: AC
  Administered 2022-11-24: 2 g via INTRAVENOUS
  Filled 2022-11-24: qty 50

## 2022-11-24 NOTE — Progress Notes (Signed)
Rounding Note    Patient Name: Kathleen Velazquez Date of Encounter: 11/24/2022  [facility] Cardiologist: None   Subjective   Had a "bad night", did not sleep much. Continue to have nonproductive cough.   Inpatient Medications    Scheduled Meds:  empagliflozin  10 mg Oral Daily   furosemide  40 mg Intravenous Q12H   gemfibrozil  600 mg Oral BID AC   melatonin  3 mg Oral QHS   [START ON 11/25/2022] metoprolol succinate  50 mg Oral Daily   sodium chloride flush  3 mL Intravenous Q12H   spironolactone  12.5 mg Oral Daily   Continuous Infusions:  sodium chloride     PRN Meds: sodium chloride, acetaminophen **OR** acetaminophen, benzonatate, loperamide, oxyCODONE, sodium chloride flush   Vital Signs    Vitals:   11/24/22 0257 11/24/22 0326 11/24/22 0523 11/24/22 0821  BP: 106/81 112/67 103/68 111/72  Pulse: (!) 50 (!) 36 99 89  Resp:   18 16  Temp:   98.5 F (36.9 C)   TempSrc:   Oral   SpO2: 95% 96% 95% 98%  Weight:   57.9 kg   Height:        Intake/Output Summary (Last 24 hours) at 11/24/2022 1004 Last data filed at 11/24/2022 0528 Gross per 24 hour  Intake --  Output 1300 ml  Net -1300 ml      11/24/2022    5:23 AM 11/19/2022    2:45 AM 11/18/2022    7:11 PM  Last 3 Weights  Weight (lbs) 127 lb 10.3 oz 132 lb 4.4 oz 139 lb 15.9 oz  Weight (kg) 57.9 kg 60 kg 63.5 kg      Telemetry    NSR with frequent PACs/PVCs, bursts of atrial tach and NSVT - Personally Reviewed  ECG    NSR with TWI in the anterior leads and frequent PVCs - Personally Reviewed  Physical Exam   GEN: No acute distress.   Neck: No JVD Cardiac: RRR, no murmurs, rubs, or gallops.  Respiratory: Clear to auscultation bilaterally. GI: Soft, nontender, non-distended  MS: No edema; No deformity. Neuro:  Nonfocal  Psych: Normal affect   Labs    High Sensitivity Troponin:  No results for input(s): "TROPONINIHS" in the last 720 hours.   Chemistry Recent Labs  Lab  11/21/22 0351 11/22/22 0249 11/22/22 1702 11/22/22 1707 11/23/22 0215 11/24/22 0228  NA 139 136   < > 140 139 135  K 4.2 3.5   < > 4.5 4.7 3.8  CL 105 106  --   --  103 91*  CO2 21* 19*  --   --  25 28  GLUCOSE 136* 112*  --   --  93 103*  BUN 14 12  --   --  16 25*  CREATININE 1.49* 1.16*  --   --  1.20* 1.33*  CALCIUM 8.3* 8.3*  --   --  8.6* 8.7*  MG 2.0 2.1  --   --  1.8 1.5*  PROT 5.5* 5.6*  --   --  6.1*  --   ALBUMIN 2.8* 2.9*  --   --  3.1*  --   AST 108* 63*  --   --  74*  --   ALT 88* 77*  --   --  87*  --   ALKPHOS 78 92  --   --  105  --   BILITOT 1.7* 1.3*  --   --  1.5*  --  GFRNONAA 38* 51*  --   --  49* 43*  ANIONGAP 13 11  --   --  11 16*   < > = values in this interval not displayed.    Lipids No results for input(s): "CHOL", "TRIG", "HDL", "LABVLDL", "LDLCALC", "CHOLHDL" in the last 168 hours.  Hematology Recent Labs  Lab 11/22/22 0249 11/22/22 1702 11/22/22 1707 11/23/22 0215 11/24/22 0228  WBC 5.4  --   --  4.9 6.5  RBC 3.36*  --   --  3.68* 3.96  HGB 11.5*   < > 10.9* 12.9 13.7  HCT 35.4*   < > 32.0* 38.6 40.4  MCV 105.4*  --   --  104.9* 102.0*  MCH 34.2*  --   --  35.1* 34.6*  MCHC 32.5  --   --  33.4 33.9  RDW 14.3  --   --  14.3 14.2  PLT 117*  --   --  148* 155   < > = values in this interval not displayed.   Thyroid  Recent Labs  Lab 11/19/22 0345 11/19/22 0941  TSH 6.980*  --   FREET4  --  0.71    BNP Recent Labs  Lab 11/20/22 1651  BNP 1,370.1*    DDimer  Recent Labs  Lab 11/20/22 1651  DDIMER 2.94*     Radiology    DG CHEST PORT 1 VIEW  Result Date: 11/23/2022 CLINICAL DATA:  Shortness of breath EXAM: PORTABLE CHEST 1 VIEW COMPARISON:  X-ray 11/21/2022 FINDINGS: Underinflation. Borderline cardiopericardial silhouette. No consolidation, pneumothorax or effusion. No edema. Overlapping cardiac leads. IMPRESSION: Underinflation. Borderline size heart. No acute cardiopulmonary disease Electronically Signed   By: Karen Kays M.D.   On: 11/23/2022 15:12   CARDIAC CATHETERIZATION  Result Date: 11/22/2022   Mid LAD lesion is 20% stenosed.   Mid Cx lesion is 20% stenosed. Mild non-obstructive disease in the mid Circumflex and mid LAD Moderate caliber non-dominant RCA with no disease. Prominent Thebesian vein network noted on angiography Elevated right and left heart pressures Fick Cardiac Output 4.1 L/min Cardiac index 2.58 Recommendations: GDMT for heart failure. She has received IV Lasix this am. I will give another 40 mg of IV Lasix tonight. Medical management of mild CAD.    Cardiac Studies   Echo 11/20/2022 1. Very difficult study due to poor echo windows and lack of  visualization of LV endocardium. However, based on limited views, there is  severe biventricular failure with estimated LVEF ~25% (endocardium very  poorly visualized) and severe RV dysfunction.   2. Left ventricular ejection fraction, by estimation, is 25 to 30%. The  left ventricle has severely decreased function. Left ventricular  endocardial border not optimally defined to evaluate regional wall motion.  There is mild left ventricular  hypertrophy. Left ventricular diastolic parameters are consistent with  Grade II diastolic dysfunction (pseudonormalization).   3. Right ventricular systolic function is severely reduced. The right  ventricular size is mildly enlarged.   4. Left atrial size was mildly dilated.   5. The mitral valve is degenerative. Mild to moderate mitral valve  regurgitation. Moderate mitral annular calcification.   6. The aortic valve is tricuspid. There is mild calcification of the  aortic valve. There is mild thickening of the aortic valve. Aortic valve  regurgitation is not visualized. Aortic valve sclerosis/calcification is  present, without any evidence of  aortic stenosis.   7. The inferior vena cava is dilated in size with <50% respiratory  variability,  suggesting right atrial pressure of 15 mmHg.   8.  Moderate pericardial effusion. There is no evidence of cardiac  tamponade.   Comparison(s): No prior Echocardiogram.    Cath 11/22/2022   Mid LAD lesion is 20% stenosed.   Mid Cx lesion is 20% stenosed.   Mild non-obstructive disease in the mid Circumflex and mid LAD Moderate caliber non-dominant RCA with no disease.  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 She has received IV Lasix this am. I will give another 40 mg of IV Lasix tonight. Medical management of mild CAD  Flowsheet Row Most Recent Value  Fick Cardiac Output 4.13 L/min  Fick Cardiac Output Index 2.58 (L/min)/BSA  RA A Wave 22 mmHg  RA V Wave 18 mmHg  RA Mean 16 mmHg  RV Systolic Pressure 48 mmHg  RV Diastolic Pressure 4 mmHg  RV EDP 23 mmHg  PA Systolic Pressure 47 mmHg  PA Diastolic Pressure 29 mmHg  PA Mean 35 mmHg  PW A Wave 19 mmHg  PW V Wave 27 mmHg  PW Mean 24 mmHg  AO Systolic Pressure 127 mmHg  AO Diastolic Pressure 84 mmHg  AO Mean 101 mmHg  LV Systolic Pressure 132 mmHg  LV Diastolic Pressure 5 mmHg  LV EDP 18 mmHg  AOp Systolic Pressure 125 mmHg  AOp Diastolic Pressure 79 mmHg  AOp Mean Pressure 98 mmHg  LVp Systolic Pressure 121 mmHg  LVp Diastolic Pressure 15 mmHg  LVp EDP Pressure 18 mmHg  QP/QS 1  TPVR Index 13.58 HRUI  TSVR Index 39.19 HRUI  PVR SVR Ratio 0.13  TPVR/TSVR Ratio 0.35    Patient Profile     67 y.o. female with PMH of anxiety, HTN, IBS, GERD and sinus tachycardia who presented with R sided weakness. Echo showed severe biventricular failure with EF 25-30% and severe RV dysfunction.   Assessment & Plan    Severe biventricular heart failure  - Echo 11/20/2022 showed EF 25%, grade 2 DD, severely reduced RVEF, mild to moderate MR.   - L&RHC on 11/22/2022 showed minimal disease, CO 4.1, CI 2.58. Patient declined cardiac MRI.   -  continue spironolactone, metoprolol succinate and jardiance. Lasix 40mg  BID. No BP room to add ARB/ACEI.   - will review with MD, unclear why she continue to have cough and orthopnea, she appeared euvolemic on exam. Lung clear. Likely can transition to oral lasix soon.   Hypokalemia: K 2.1, improved with supplement  Elevated D-dimer: VQ scan negative  R sided weakness: CT and MRI of brain negative for acute intracranial pathology  Sinus tachycardia: metoprolol succinate increased  Frequent PVCs      For questions or updates, please contact [facility] Please consult [email or web address] for contact info under        Signed, Azalee Course, PA  11/24/2022, 10:04 AM

## 2022-11-24 NOTE — Progress Notes (Signed)
Daily Progress Note   Patient Name: Kathleen Velazquez       Date: 11/24/2022 DOB: [birth year]  Age: 67 y.o. MRN#: [id number] Attending Physician: [name], MD Primary Care Physician: Toma Deiters, MD Admit Date: 11/18/2022 Length of Stay: 5 days  Reason for Consultation/Follow-up: Establishing goals of care  HPI/Patient Profile:   67 y.o. female  with past medical history of anxiety, GERD, hypertension, sinus tachycardia, IBS with chronic diarrhea who presented to the emergency department with complaint of right-sided weakness, difficulty in ambulation.  Stroke was suspected on admission.  CT head did not show any acute findings. She admitted on 11/18/2022 with right-sided weakness, severe anemia, chronic diarrhea, prolonged QT (likely due to hypokalemia).    Further workup found severe new onset HFrEF with EF 25 to 30%.   PMT was consulted for GOC conversations.  Subjective:   Subjective: Chart Reviewed. Updates received. Patient Assessed. Created space and opportunity for patient  and family to explore thoughts and feelings regarding current medical situation.  Today's Discussion: Today I saw the patient at the bedside. She continues to complain of persistent dry cough. This seems to be her biggest complaint to date. She denies significant pain. She states she had a rough night last night with "a lot of people coming in for my heart rate." Discussed further the issues with her heart rate, cariology recommending to supplement Mag and Potassium to help. Explained physiology link between electrolyte derangements and arrhythmia/tachycardia.  Spoke about her grandchildren, who weren't able to come visit yesterday but are coming today. She is very happy to be able to see her grandsons.   We talked about Palliative medicine backing off for now as her goals are clear. However, reiterated we are available if she needs [place] or if staff feels it would be beneficial to come see her this weekend.  I  provided emotional and general support through therapeutic listening, empathy, sharing of stories, therapeutic touch, and other techniques. I answered all questions and addressed all concerns to the best of my ability.  Review of Systems  Constitutional:  Positive for fatigue.  Respiratory:  Positive for cough. Negative for chest tightness and shortness of breath.   Cardiovascular:  Negative for chest pain.  Gastrointestinal:  Negative for abdominal pain, nausea and vomiting.    Objective:   Vital Signs:  BP 111/72 (BP Location: Left Arm)   Pulse 89   Temp 98.5 F (36.9 C) (Oral)   Resp 16   Ht  (1.575 m)   Wt 57.9 kg   SpO2 98%   BMI 23.35 kg/m   Physical Exam: Physical Exam Vitals and nursing note reviewed.  Constitutional:      General: She is not in acute distress.    Appearance: She is ill-appearing.  HENT:     Head: Normocephalic and atraumatic.  Neurological:     Mental Status: She is alert.     Palliative Assessment/Data: 60%    Existing Vynca/ACP Documentation: None  Assessment & Plan:   Impression: Present on Admission:  Hypokalemia  IBS (irritable bowel syndrome)  Sinus tachycardia  SUMMARY OF RECOMMENDATIONS   Full Code Refuses cardiac MRI Full scope of care otherwise Continued emotional support of patient PMT will back off for now Please notify us for any significant clinical change or new palliative needs  Symptom Management:  Per primary team PMT is available to assist as needed  Code Status: Full code  Prognosis: Unable to determine  Discharge Planning: To  Be Determined  Discussed with: Patient, medical team, nursing team  Thank you for allowing [place] to participate in the care of Kathleen Velazquez PMT will continue to support holistically.  Billing based on MDM: High  Problems Addressed: One acute or chronic illness or injury that poses a threat to life or bodily function  Amount and/or Complexity of Data: Category  3:Discussion of management or test interpretation with external physician/other qualified health care professional/appropriate source (not separately reported)  Risks: N/A    Wynne Dust, NP Palliative Medicine Team  Team Phone # [phone] (Nights/Weekends)  04/06/2021, 8:17 AM

## 2022-11-24 NOTE — Progress Notes (Addendum)
   Heart Failure Stewardship Pharmacist Progress Note   PCP: Toma Deiters, MD PCP-Cardiologist: None    HPI:  67 yo F with PMH of HTN, anxiety, GERD, IBS, and tachycardia.   Presented to the ED on 4/12 with R arm weakness and LE weakness. CT head and MRI with no acute intracranial findings. R sided weakness believed due to hypokalemia from chronic diarrhea. CXR revealed mild cardiomegaly with pulmonary edema. ECHO showed severe biventricular failure with LVEF 25-30% and severe RV dysfunction. Taken for [facility] on 4/16 and found to have mild nonobstructive CAD. RA 16, PA 35, wedge 24, CO 4.1, CI 2.6. Patient not agreeable to MRI.  Current HF Medications: Diuretic: furosemide 40 mg IV BID Beta Blocker: metoprolol XL 50 mg daily MRA: spironolactone 12.5 mg daily SGLT2i: Jardiance 10 mg daily  Prior to admission HF Medications: None  Pertinent Lab Values: Serum creatinine 1.33, BUN 25, Potassium 3.8, Sodium 135, BNP 1370.1, Magnesium 1.5  Vital Signs: Weight: 127 lbs (admission weight: 139 lbs) Blood pressure: 110/70s  Heart rate: 90-100s  I/O: incomplete  Medication Assistance / Insurance Benefits Check: Does the patient have prescription insurance?  Yes Type of insurance plan: Aetna - commercial plan  Outpatient Pharmacy:  [facility] Is the patient willing to use [facility] TOC pharmacy at discharge? No Is the patient willing to transition their outpatient pharmacy to utilize a [facility] outpatient pharmacy?   No     Assessment: 1. 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 NYHA class III symptoms. - Continue furosemide 40 mg IV BID. Strict I/Os and daily weights. Keep K>4 and Mg>2. Magnesium 2 g IV x 1 and KCl 20 mEq x 1 given for replacement. - Agree with increasing to metoprolol XL 50 mg daily - Consider starting ARB/ARNI once creatinine stable - Continue Jardiance 10 mg daily   Plan: 1) Medication changes recommended at this time: -  Agree with changes  2) Patient assistance: - Jardiance copay $35 - can use copay card to lower to $10 per month - Entresto copay $35 - can use copay card to lower to $10 per fill  3)  Education  - Patient has been educated on current HF medications and potential additions to HF medication regimen - Patient verbalizes understanding that over the next few months, these medication doses may change and more medications may be added to optimize HF regimen - Patient has been educated on basic disease state pathophysiology and goals of therapy   Sharen Hones, PharmD, BCPS Heart Failure Stewardship Pharmacist Phone [phone]

## 2022-11-24 NOTE — Progress Notes (Signed)
  Re: Intermittent Tachycardic episodes. BNP and Mg labs resulted: K=3.8, Mg= 1.5. Pt specific goals of therapy are 2 and 4 respectively. Paged MD and orders for Mg (2g)  and K (20 mEq) replacement implemented.Pt tolerated treatment well and is sitting in bed with no complaint of pain or SOB: Vital: T 98.2 BP 103/68(78), HR 115. Resp 18 O2 98% 2L

## 2022-11-24 NOTE — Plan of Care (Signed)

## 2022-11-24 NOTE — Progress Notes (Signed)
PT Cancellation Note  Patient Details Name: Kathleen Velazquez MRN: [id number] DOB: 07-14-1956   Cancelled Treatment:    Reason Eval/Treat Not Completed: Medical issues which prohibited therapy (Checked in with RN who reported that pt is likely not appropriate for therapy today due to HR. Pt had RRT called last night for SVT. Will follow up tomorrow if appropriate.)   [name] 11/24/2022, 8:17 AM

## 2022-11-24 NOTE — Progress Notes (Signed)
Mobility Specialist Progress Note:   11/24/22 1410  Mobility  Activity Transferred from bed to chair  Level of Assistance Contact guard assist, steadying assist  Assistive Device Other (Comment) (HHA)  Distance Ambulated (ft) 5 ft  Activity Response Tolerated well  Mobility Referral Yes  $Mobility charge 1 Mobility   Pt received sitting EOB, soaked in urine. Required HHA to stand and transfer to chair. Pericare performed, purewick in place. RN aware.   Addison Lank Mobility Specialist Please contact via SecureChat or  Rehab office at [phone]

## 2022-11-24 NOTE — Progress Notes (Signed)
[facility]                                                                               Kathleen Velazquez, is a 67 y.o. female, DOB - 11-14-1955, ZOX:[id number] Admit date - 11/18/2022    Outpatient Primary MD for the patient is Kathleen Deiters, MD  LOS - 5  days    Brief summary    Kathleen Velazquez is a 67 y.o. female with a hx of anxiety, GERD, HTN, IBS, and sinus tachycardia presents with right sided weakness.   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  - she was started on IV lasix 40 mg BID, spironolactone 12.5 mg daily.  - continue with strict intake and output. Daily weights.  - echocardiogram showed there is  severe biventricular failure with estimated LVEF ~25%  and severe RV dysfunction.  FARXIGA 10 mg daily added to her regimen.  - diuresed about net neg 2 lit since admission.    * Right sided weakness CT and MRI of the brain negative for acute pathology and stroke.  Neurology on board and suggested possibly from hypokalemia.  Therapy evaluations ordered.  But patient refused home health PT   Hyperlipidemia - Continue home gemfibrozil  IBS (irritable bowel syndrome) Stable.   Hypokalemia Replaced .   SVT: Increase metoprolol succinate to 50 mg daily.   Non obstructive CAD;   Cough:  Intermittent, .  CXR is negative for acute cardiopulmonary disease.     Estimated body mass index is 23.35 kg/m as calculated from the following:   Height as of this encounter:  (1.575 m).   Weight as of this encounter: 57.9 kg.  Code Status: full code.  DVT Prophylaxis:  SCD's Start: 11/22/22 1739 SCDs Start: 11/19/22 0305   Level of Care: Level of care: Progressive Family Communication: none at bedside.   Disposition Plan:     Remains inpatient appropriate:  IV diuresis.    Procedures:  Cardiac cath on 4/16  Consultants:   Cardiology.   Antimicrobials:   Anti-infectives (From admission, onward)    None        Medications  Scheduled Meds:  empagliflozin  10 mg Oral Daily   furosemide  40 mg Intravenous Q12H   gemfibrozil  600 mg Oral BID AC   melatonin  3 mg Oral QHS   [START ON 11/25/2022] metoprolol succinate  50 mg Oral Daily   sodium chloride flush  3 mL Intravenous Q12H   spironolactone  12.5 mg Oral Daily   Continuous Infusions:  sodium chloride     PRN Meds:.sodium chloride, acetaminophen **OR** acetaminophen, benzonatate, loperamide, oxyCODONE, sodium chloride flush    Subjective:   Kathleen Velazquez was seen and examined today.   Dry cough. No chest pain or sob.   Objective:   Vitals:   11/24/22 0257 11/24/22 0326 11/24/22 0523 11/24/22 0821  BP: 106/81 112/67 103/68 111/72  Pulse: (!) 50 (!) 36 99 89  Resp:   18 16  Temp:  98.5 F (36.9 C)   TempSrc:   Oral   SpO2: 95% 96% 95% 98%  Weight:   57.9 kg   Height:        Intake/Output Summary (Last 24 hours) at 11/24/2022 0843 Last data filed at 11/24/2022 0528 Gross per 24 hour  Intake --  Output 1300 ml  Net -1300 ml    Filed Weights   11/18/22 1911 11/19/22 0245 11/24/22 0523  Weight: 63.5 kg 60 kg 57.9 kg     Exam General exam: Appears calm and comfortable  Respiratory system: Clear to auscultation. Respiratory effort normal. Cardiovascular system: S1 & S2 heard, tachycardic.  Gastrointestinal system: Abdomen is nondistended, soft and nontender. Central nervous system: Alert and oriented. No focal neurological deficits. Extremities: Symmetric 5 x 5 power. Skin: No rashes, lesions or ulcers Psychiatry: Mood & affect appropriate.    Data Reviewed:  I have personally reviewed following labs and imaging studies   CBC Lab Results  Component Value Date   WBC 6.5 11/24/2022   RBC 3.96 11/24/2022   HGB 13.7 11/24/2022   HCT 40.4 11/24/2022   MCV 102.0  (H) 11/24/2022   MCH 34.6 (H) 11/24/2022   PLT 155 11/24/2022   MCHC 33.9 11/24/2022   RDW 14.2 11/24/2022   LYMPHSABS 1.6 11/24/2022   MONOABS 0.9 11/24/2022   EOSABS 0.0 11/24/2022   BASOSABS 0.0 11/24/2022     Last metabolic panel Lab Results  Component Value Date   NA 135 11/24/2022   K 3.8 11/24/2022   CL 91 (L) 11/24/2022   CO2 28 11/24/2022   BUN 25 (H) 11/24/2022   CREATININE 1.33 (H) 11/24/2022   GLUCOSE 103 (H) 11/24/2022   GFRNONAA 43 (L) 11/24/2022   GFRAA >60 02/28/2020   CALCIUM 8.7 (L) 11/24/2022   PHOS 6.4 (H) 11/24/2022   PROT 6.1 (L) 11/23/2022   ALBUMIN 3.1 (L) 11/23/2022   BILITOT 1.5 (H) 11/23/2022   ALKPHOS 105 11/23/2022   AST 74 (H) 11/23/2022   ALT 87 (H) 11/23/2022   ANIONGAP 16 (H) 11/24/2022    CBG (last 3)  No results for input(s): "GLUCAP" in the last 72 hours.    Coagulation Profile: Recent Labs  Lab 11/21/22 0938  INR 1.2      Radiology Studies: DG CHEST PORT 1 VIEW  Result Date: 11/23/2022 CLINICAL DATA:  Shortness of breath EXAM: PORTABLE CHEST 1 VIEW COMPARISON:  X-ray 11/21/2022 FINDINGS: Underinflation. Borderline cardiopericardial silhouette. No consolidation, pneumothorax or effusion. No edema. Overlapping cardiac leads. IMPRESSION: Underinflation. Borderline size heart. No acute cardiopulmonary disease Electronically Signed   By: Karen Kays M.D.   On: 11/23/2022 15:12   CARDIAC CATHETERIZATION  Result Date: 11/22/2022   Mid LAD lesion is 20% stenosed.   Mid Cx lesion is 20% stenosed. Mild non-obstructive disease in the mid Circumflex and mid LAD Moderate caliber non-dominant RCA with no disease. Prominent The[place]ian vein network noted on angiography Elevated right and left heart pressures Fick Cardiac Output 4.1 L/min Cardiac index 2.58 Recommendations: GDMT for heart failure. She has received IV Lasix this am. I will give another 40 mg of IV Lasix tonight. Medical management of mild CAD.       Kathleen Velazquez  M.D. [facility] 11/24/2022, 8:43 AM  Available via Epic secure chat 7am-7pm After 7 pm, please refer to night coverage provider listed on amion.

## 2022-11-24 NOTE — Significant Event (Signed)
Rapid Response Event Note   Reason for Call :  Intermittent SVT  Initial Focused Assessment:  Pt lying in bed with eyes open, in no visible distress. Pt's having intermittent SVT. Pt says her chest feels uncomfortable when her HR is high but that she isn't SOB or having any dizziness. Lungs clear/diminished. Skin warm and dry.  HR-90-110, BP-106/81, RR-20, SpO2-99% on RA   Interventions:  EKG AM BMP pending.  Plan of Care:  SVT isn't sustained. BP currently stable. Await lab results and any MD orders. Continue to monitor pt closely. Call RRT if further assistance needed.   Event Summary:   MD Notified: RN to notify MD Call [phone] Arrival Time:0303 End UUVO:5366  Terrilyn Saver, RN

## 2022-11-24 NOTE — Progress Notes (Signed)
Pt assisted back  to bed from [facility].No complaints of SOB or CP. Observed HR rapidly fluctuating, going from low 30s up to 150s, repeating this cycle. Remains asymptomatic. Cardiologist on call paged.Per MD,  continue monitoring and notify if pt becomes symptomatic.

## 2022-11-25 ENCOUNTER — Inpatient Hospital Stay (HOSPITAL_COMMUNITY): Payer: Medicare Other

## 2022-11-25 DIAGNOSIS — E876 Hypokalemia: Secondary | ICD-10-CM | POA: Diagnosis not present

## 2022-11-25 DIAGNOSIS — R531 Weakness: Secondary | ICD-10-CM | POA: Diagnosis not present

## 2022-11-25 DIAGNOSIS — I50810 Right heart failure, unspecified: Secondary | ICD-10-CM | POA: Diagnosis not present

## 2022-11-25 DIAGNOSIS — E782 Mixed hyperlipidemia: Secondary | ICD-10-CM | POA: Diagnosis not present

## 2022-11-25 DIAGNOSIS — I5041 Acute combined systolic (congestive) and diastolic (congestive) heart failure: Secondary | ICD-10-CM | POA: Diagnosis not present

## 2022-11-25 LAB — CBC WITH DIFFERENTIAL/PLATELET
Abs Immature Granulocytes: 0.03 10*3/uL (ref 0.00–0.07)
Basophils Absolute: 0 10*3/uL (ref 0.0–0.1)
Basophils Relative: 1 %
Eosinophils Absolute: 0 10*3/uL (ref 0.0–0.5)
Eosinophils Relative: 0 %
HCT: 41.3 % (ref 36.0–46.0)
Hemoglobin: 14.4 g/dL (ref 12.0–15.0)
Immature Granulocytes: 1 %
Lymphocytes Relative: 23 %
Lymphs Abs: 1.3 10*3/uL (ref 0.7–4.0)
MCH: 35 pg — ABNORMAL HIGH (ref 26.0–34.0)
MCHC: 34.9 g/dL (ref 30.0–36.0)
MCV: 100.2 fL — ABNORMAL HIGH (ref 80.0–100.0)
Monocytes Absolute: 0.7 10*3/uL (ref 0.1–1.0)
Monocytes Relative: 13 %
Neutro Abs: 3.6 10*3/uL (ref 1.7–7.7)
Neutrophils Relative %: 62 %
Platelets: 120 10*3/uL — ABNORMAL LOW (ref 150–400)
RBC: 4.12 MIL/uL (ref 3.87–5.11)
RDW: 14.1 % (ref 11.5–15.5)
WBC: 5.7 10*3/uL (ref 4.0–10.5)
nRBC: 0 % (ref 0.0–0.2)

## 2022-11-25 LAB — PHOSPHORUS: Phosphorus: 5.2 mg/dL — ABNORMAL HIGH (ref 2.5–4.6)

## 2022-11-25 LAB — LIPID PANEL
Cholesterol: 124 mg/dL (ref 0–200)
HDL: 34 mg/dL — ABNORMAL LOW (ref >40)
LDL Cholesterol: 48 mg/dL (ref 0–99)
Total CHOL/HDL Ratio: 3.6 RATIO
Triglycerides: 211 mg/dL — ABNORMAL HIGH (ref <150)
VLDL: 42 mg/dL — ABNORMAL HIGH (ref 0–40)

## 2022-11-25 LAB — MAGNESIUM: Magnesium: 1.9 mg/dL (ref 1.7–2.4)

## 2022-11-25 LAB — BASIC METABOLIC PANEL
Anion gap: 15 (ref 5–15)
BUN: 27 mg/dL — ABNORMAL HIGH (ref 8–23)
CO2: 30 mmol/L (ref 22–32)
Calcium: 8.5 mg/dL — ABNORMAL LOW (ref 8.9–10.3)
Chloride: 90 mmol/L — ABNORMAL LOW (ref 98–111)
Creatinine, Ser: 1.38 mg/dL — ABNORMAL HIGH (ref 0.44–1.00)
GFR, Estimated: 41 mL/min — ABNORMAL LOW (ref >60)
Glucose, Bld: 105 mg/dL — ABNORMAL HIGH (ref 70–99)
Potassium: 3.4 mmol/L — ABNORMAL LOW (ref 3.5–5.1)
Sodium: 135 mmol/L (ref 135–145)

## 2022-11-25 MED ORDER — POTASSIUM CHLORIDE CRYS ER 20 MEQ PO TBCR
40.0000 meq | EXTENDED_RELEASE_TABLET | Freq: Once | ORAL | Status: AC
  Administered 2022-11-25: 40 meq via ORAL
  Filled 2022-11-25: qty 2

## 2022-11-25 MED ORDER — PANTOPRAZOLE SODIUM 40 MG PO TBEC
40.0000 mg | DELAYED_RELEASE_TABLET | Freq: Every day | ORAL | Status: DC
  Administered 2022-11-25 – 2022-11-27 (×3): 40 mg via ORAL
  Filled 2022-11-25 (×3): qty 1

## 2022-11-25 NOTE — Progress Notes (Signed)
Physical Therapy Treatment Patient Details Name: Kathleen Velazquez MRN: [id number] DOB: 04-15-1956 Today's Date: 11/25/2022   History of Present Illness Pt is a 67 yo female presenting to ED with reports of R sided weakness. Imaging without acute findings, hospital course complicated by low potassium. PMH: anxiety, GERD, HTN, IBS, sinus tachycardia, arthritis    PT Comments    Pt tolerated treatment well today. Pt was able to progress ambulation in hallway with RW with no instances of knee buckling today. Pt HR fluctuated greatly today during session. Pt HR up to 128 at rest. HR mostly in the 130s during ambulation however had 1 spike to 170. Pt in NAD. Pt reported lightheadedness once seated EOB however BP was taken and found to be 112/74. RN made aware. No change in DC/DME recs at this time. PT will continue to follow.  Recommendations for follow up therapy are one component of a multi-disciplinary discharge planning process, led by the attending physician.  Recommendations may be updated based on patient status, additional functional criteria and insurance authorization.  Follow Up Recommendations       Assistance Recommended at Discharge Intermittent Supervision/Assistance  Patient can return home with the following A little help with walking and/or transfers;A little help with bathing/dressing/bathroom;Assistance with cooking/housework;Direct supervision/assist for medications management;Direct supervision/assist for financial management;Assist for transportation;Help with stairs or ramp for entrance   Equipment Recommendations  None recommended by PT    Recommendations for Other Services       Precautions / Restrictions Precautions Precautions: Fall Precaution Comments: Watch HR Restrictions Weight Bearing Restrictions: No     Mobility  Bed Mobility Overal bed mobility: Modified Independent                  Transfers Overall transfer level: Needs  assistance Equipment used: Rolling walker (2 wheels) Transfers: Sit to/from Stand Sit to Stand: Min guard                Ambulation/Gait Ambulation/Gait assistance: Min guard Gait Distance (Feet): 200 Feet Assistive device: Rolling walker (2 wheels) Gait Pattern/deviations: Step-through pattern, Decreased stride length Gait velocity: decreased     General Gait Details: Pt much more steady today. No instances of knee buckling.   Stairs             Wheelchair Mobility    Modified Rankin (Stroke Patients Only)       Balance Overall balance assessment: Needs assistance Sitting-balance support: Bilateral upper extremity supported, Feet supported Sitting balance-Leahy Scale: Normal Sitting balance - Comments: sitting EOB   Standing balance support: During functional activity, Bilateral upper extremity supported Standing balance-Leahy Scale: Fair Standing balance comment: Reliant on RW                            Cognition Arousal/Alertness: Awake/alert Behavior During Therapy: WFL for tasks assessed/performed Overall Cognitive Status: Within Functional Limits for tasks assessed                                 General Comments: Pt reports feeling much better compared to yesterday        Exercises      General Comments General comments (skin integrity, edema, etc.): HR up to 128 at rest. HR mostly in the 130s during ambulation however had 1 spike to 170. Pt in NAD. Pt reported lightheadedness once seated EOB however BP was taken and  found to be 112/74. RN made aware.      Pertinent Vitals/Pain Pain Assessment Pain Assessment: No/denies pain    Home Living                          Prior Function            PT Goals (current goals can now be found in the care plan section) Progress towards PT goals: Progressing toward goals    Frequency    Min 1X/week      PT Plan Current plan remains appropriate     Co-evaluation              AM-PAC PT "6 Clicks" Mobility   Outcome Measure  Help needed turning from your back to your side while in a flat bed without using bedrails?: None Help needed moving from lying on your back to sitting on the side of a flat bed without using bedrails?: None Help needed moving to and from a bed to a chair (including a wheelchair)?: A Little Help needed standing up from a chair using your arms (e.g., wheelchair or bedside chair)?: A Little Help needed to walk in hospital room?: A Little Help needed climbing 3-5 steps with a railing? : A Lot 6 Click Score: 19    End of Session Equipment Utilized During Treatment: Gait belt Activity Tolerance: Patient tolerated treatment well Patient left: in bed;with call bell/phone within reach Nurse Communication: Mobility status;Other (comment) (HR) PT Visit Diagnosis: Unsteadiness on feet (R26.81);Other abnormalities of gait and mobility (R26.89);Repeated falls (R29.6);Muscle weakness (generalized) (M62.81)     Time: 1009-1030 PT Time Calculation (min) (ACUTE ONLY): 21 min  Charges:  $Gait Training: 8-22 mins                     Shela Nevin, PT, DPT Acute Rehab Services [phone]    [name] 11/25/2022, 12:57 PM

## 2022-11-25 NOTE — Progress Notes (Signed)
   Heart Failure Stewardship Pharmacist Progress Note   PCP: Toma Deiters, MD PCP-Cardiologist: None    HPI:  67 yo F with PMH of HTN, anxiety, GERD, IBS, and tachycardia.   Presented to the ED on 4/12 with R arm weakness and LE weakness. CT head and MRI with no acute intracranial findings. R sided weakness believed due to hypokalemia from chronic diarrhea. CXR revealed mild cardiomegaly with pulmonary edema. ECHO showed severe biventricular failure with LVEF 25-30% and severe RV dysfunction. Taken for [facility] on 4/16 and found to have mild nonobstructive CAD. RA 16, PA 35, wedge 24, CO 4.1, CI 2.6. Patient not agreeable to MRI.  Current HF Medications: Diuretic: furosemide 40 mg PO daily Beta Blocker: metoprolol XL 50 mg daily ACE/ARB/ARNI: losartan 12.5 mg daily MRA: spironolactone 12.5 mg daily SGLT2i: Jardiance 10 mg daily  Prior to admission HF Medications: None  Pertinent Lab Values: Serum creatinine 1.38, BUN 27, Potassium 3.4, Sodium 135, BNP 1370.1, Magnesium 1.9  Vital Signs: Weight: 127 lbs (admission weight: 139 lbs) Blood pressure: 110/70s  Heart rate: 90-100s  I/O: -1.9L yesterday; net incomplete  Medication Assistance / Insurance Benefits Check: Does the patient have prescription insurance?  Yes Type of insurance plan: Aetna - commercial plan  Outpatient Pharmacy:  [facility] Is the patient willing to use [facility] TOC pharmacy at discharge? No Is the patient willing to transition their outpatient pharmacy to utilize a [facility] outpatient pharmacy?   No     Assessment: 1. 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 NYHA class III symptoms. - Agree with transitioning to furosemide 40 mg PO daily. Strict I/Os and daily weights. Keep K>4 and Mg>2. KCl 40 mEq x 1 given for replacement. - Continue metoprolol XL 50 mg daily - Agree with starting losartan 12.5 mg daily - Continue Jardiance 10 mg daily   Plan: 1) Medication  changes recommended at this time: - Agree with changes  2) Patient assistance: - Jardiance copay $35 - can use copay card to lower to $10 per month - Entresto copay $35 - can use copay card to lower to $10 per fill  3)  Education  - Patient has been educated on current HF medications and potential additions to HF medication regimen - Patient verbalizes understanding that over the next few months, these medication doses may change and more medications may be added to optimize HF regimen - Patient has been educated on basic disease state pathophysiology and goals of therapy   Sharen Hones, PharmD, BCPS Heart Failure Stewardship Pharmacist Phone [phone]

## 2022-11-25 NOTE — Progress Notes (Signed)
[facility]                                                                               Kathleen Velazquez, is a 67 y.o. female, DOB - Jan 31, 1956, ZOX:[id number] Admit date - 11/18/2022    Outpatient Primary MD for the patient is Toma Deiters, MD  LOS - 6  days    Brief summary    Kathleen Velazquez is a 67 y.o. female with a hx of anxiety, GERD, HTN, IBS, and sinus tachycardia presents with right sided weakness.   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  - she was started on IV lasix 40 mg BID transitioned to oral lasix 40 mg daily. , spironolactone 12.5 mg daily.  Cozaar added today.  - continue with strict intake and output. Daily weights.  - echocardiogram showed there is  severe biventricular failure with estimated LVEF ~25%  and severe RV dysfunction.  FARXIGA 10 mg daily added to her regimen.      * Right sided weakness CT and MRI of the brain negative for acute pathology and stroke.  Neurology on board and suggested possibly from hypokalemia.  Therapy evaluations ordered.  But patient refused home health PT   Hyperlipidemia - Continue home gemfibrozil  IBS (irritable bowel syndrome) Stable.   Hypokalemia Replaced .   SVT: Increase metoprolol succinate to 50 mg daily.   Non obstructive CAD;   Cough:  Intermittent, .  CXR is negative for acute cardiopulmonary disease.  CT chest with out contrast showed Two adjacent small semi-solid lung nodules of the right costophrenic angle largest measuring 6 mm. Non-contrast chest CT at 3-6 months is Recommended. Possibly her cough could be from hiatal hernia and reflux.  Will add PPI to her regimen and monitor.     Estimated body mass index is 22.55 kg/m as calculated from the following:   Height as of this encounter: 5\' 2"  (1.575 m).    Weight as of this encounter: 55.9 kg.  Code Status: full code.  DVT Prophylaxis:  SCD's Start: 11/22/22 1739 SCDs Start: 11/19/22 0305   Level of Care: Level of care: Progressive Family Communication: none at bedside.   Disposition Plan:     Remains inpatient appropriate:  possible d/c in the morning.  Procedures:  Cardiac cath on 4/16  Consultants:   Cardiology.   Antimicrobials:   Anti-infectives (From admission, onward)    None        Medications  Scheduled Meds:  empagliflozin  10 mg Oral Daily   furosemide  40 mg Oral Daily   gemfibrozil  600 mg Oral BID AC   losartan  12.5 mg Oral Daily   melatonin  3 mg Oral QHS   metoprolol succinate  50 mg Oral Daily   sodium chloride flush  3 mL Intravenous Q12H   spironolactone  12.5 mg Oral Daily   Continuous Infusions:  sodium chloride     PRN Meds:.sodium chloride, acetaminophen **OR** acetaminophen, benzonatate, loperamide, oxyCODONE, sodium chloride flush  Subjective:   Kathleen Velazquez was seen and examined today.   Persistent dry cough. Palpitations improving.   Objective:   Vitals:   11/24/22 2003 11/25/22 0525 11/25/22 0821 11/25/22 1013  BP: 93/80 100/65 117/69   Pulse: 97  (!) 50 (!) 104  Resp: Temp: 98.6 F (37 C) 98.6 F (37 C) 98.3 F (36.8 C)   TempSrc: Oral Oral Oral   SpO2: 93%  96%   Weight:  55.9 kg    Height:       No intake or output data in the 24 hours ending 11/25/22 1424  Filed Weights   11/19/22 0245 11/24/22 0523 11/25/22 0525  Weight: 60 kg 57.9 kg 55.9 kg     Exam General exam: Appears calm and comfortable  Respiratory system: Clear to auscultation. Respiratory effort normal. Cardiovascular system: S1 & S2 heard, RRR. No JVD, murmurs,  Gastrointestinal system: Abdomen is nondistended, soft and nontender.  Central nervous system: Alert and oriented. No focal neurological deficits. Extremities: Symmetric 5 x 5 power. Skin: No rashes,  Psychiatry: Mood &  affect appropriate.     Data Reviewed:  I have personally reviewed following labs and imaging studies   CBC Lab Results  Component Value Date   WBC 5.7 11/25/2022   RBC 4.12 11/25/2022   HGB 14.4 11/25/2022   HCT 41.3 11/25/2022   MCV 100.2 (H) 11/25/2022   MCH 35.0 (H) 11/25/2022   PLT 120 (L) 11/25/2022   MCHC 34.9 11/25/2022   RDW 14.1 11/25/2022   LYMPHSABS 1.3 11/25/2022   MONOABS 0.7 11/25/2022   EOSABS 0.0 11/25/2022   BASOSABS 0.0 11/25/2022     Last metabolic panel Lab Results  Component Value Date   NA 135 11/25/2022   K 3.4 (L) 11/25/2022   CL 90 (L) 11/25/2022   CO2 30 11/25/2022   BUN 27 (H) 11/25/2022   CREATININE 1.38 (H) 11/25/2022   GLUCOSE 105 (H) 11/25/2022   GFRNONAA 41 (L) 11/25/2022   GFRAA >60 02/28/2020   CALCIUM 8.5 (L) 11/25/2022   PHOS 5.2 (H) 11/25/2022   PROT 6.1 (L) 11/23/2022   ALBUMIN 3.1 (L) 11/23/2022   BILITOT 1.5 (H) 11/23/2022   ALKPHOS 105 11/23/2022   AST 74 (H) 11/23/2022   ALT 87 (H) 11/23/2022   ANIONGAP 15 11/25/2022    CBG (last 3)  No results for input(s): "GLUCAP" in the last 72 hours.    Coagulation Profile: Recent Labs  Lab 11/21/22 0938  INR 1.2      Radiology Studies: CT CHEST WO CONTRAST  Result Date: 11/25/2022 CLINICAL DATA:  Respiratory illness. EXAM: CT CHEST WITHOUT CONTRAST TECHNIQUE: Multidetector CT imaging of the chest was performed following the standard protocol without IV contrast. RADIATION DOSE REDUCTION: This exam was performed according to the departmental dose-optimization program which includes automated exposure control, adjustment of the mA and/or kV according to patient size and/or use of iterative reconstruction technique. COMPARISON:  X-ray 11/23/2022 and older.  CT scan December 2017 FINDINGS: Cardiovascular: Small pericardial effusion. There is prominent epicardial fat. The heart itself is nonenlarged. The thoracic aorta has a normal course and caliber on this non IV contrast  exam with mild calcified plaque. Significant coronary artery calcifications are seen. Mediastinum/Nodes: Large hiatal hernia. Slightly patulous esophagus above the hernia. Small thyroid gland. No specific abnormal lymph node enlargement identified in the axillary regions, hilum or mediastinum. Lungs/Pleura: No consolidation, pneumothorax or effusion. Mild basilar scar or atelectasis. Small ill-defined nodular  area right lower lobe measuring 4 mm. Semi-solid nodule. Similar focus just caudal to this measuring 6 mm on series 4 image 114 as well. Upper Abdomen: The adrenal glands are preserved in the upper abdomen. Previous cholecystectomy. Musculoskeletal: Healed right-sided rib fracture. Minimal degenerative changes along the spine IMPRESSION: Two adjacent small semi-solid lung nodules of the right costophrenic angle largest measuring 6 mm. Non-contrast chest CT at 3-6 months is recommended. If nodules persist, subsequent management will be based upon the most suspicious nodule(s). This recommendation follows the consensus statement: Guidelines for Management of Incidental Pulmonary Nodules Detected on CT Images: From the Fleischner Society 2017; Radiology 2017; 284:228-243. Large hiatal hernia with a slightly patulous esophagus. Significant coronary artery calcifications. Please correlate for other coronary risk factors Aortic Atherosclerosis (ICD10-I70.0). Electronically Signed   By: Karen Kays M.D.   On: 11/25/2022 14:09   DG CHEST PORT 1 VIEW  Result Date: 11/23/2022 CLINICAL DATA:  Shortness of breath EXAM: PORTABLE CHEST 1 VIEW COMPARISON:  X-ray 11/21/2022 FINDINGS: Underinflation. Borderline cardiopericardial silhouette. No consolidation, pneumothorax or effusion. No edema. Overlapping cardiac leads. IMPRESSION: Underinflation. Borderline size heart. No acute cardiopulmonary disease Electronically Signed   By: Karen Kays M.D.   On: 11/23/2022 15:12       [name] M.D. [facility] 11/25/2022, 2:24 PM  Available via Epic secure chat 7am-7pm After 7 pm, please refer to night coverage provider listed on amion.

## 2022-11-25 NOTE — Progress Notes (Signed)
Rounding Note    Patient Name: Kathleen Velazquez Date of Encounter: 11/25/2022  [facility] Cardiologist: None   Subjective   Continue to have a dry cough. Only ambulated short distance yesterday to the bathroom with assistance.   Inpatient Medications    Scheduled Meds:  empagliflozin  10 mg Oral Daily   furosemide  40 mg Oral Daily   gemfibrozil  600 mg Oral BID AC   losartan  12.5 mg Oral Daily   melatonin  3 mg Oral QHS   metoprolol succinate  50 mg Oral Daily   sodium chloride flush  3 mL Intravenous Q12H   spironolactone  12.5 mg Oral Daily   Continuous Infusions:  sodium chloride     PRN Meds: sodium chloride, acetaminophen **OR** acetaminophen, benzonatate, loperamide, oxyCODONE, sodium chloride flush   Vital Signs    Vitals:   11/24/22 1236 11/24/22 1612 11/24/22 2003 11/25/22 0525  BP: (!) 117/91 101/76 93/80 100/65  Pulse: (!) 101 97 97   Resp: Temp:  98.4 F (36.9 C) 98.6 F (37 C) 98.6 F (37 C)  TempSrc:  Oral Oral Oral  SpO2: 92% 94% 93%   Weight:    55.9 kg  Height:        Intake/Output Summary (Last 24 hours) at 11/25/2022 0723 Last data filed at 11/24/2022 1248 Gross per 24 hour  Intake --  Output 700 ml  Net -700 ml       11/25/2022    5:25 AM 11/24/2022    5:23 AM 11/19/2022    2:45 AM  Last 3 Weights  Weight (lbs) 123 lb 4.8 oz 127 lb 10.3 oz 132 lb 4.4 oz  Weight (kg) 55.929 kg 57.9 kg 60 kg      Telemetry    NSR with frequent PACs/PVCs, bursts of atrial tach and NSVT - Personally Reviewed  ECG    NSR with TWI in the anterior leads and frequent PVCs - Personally Reviewed  Physical Exam   GEN: No acute distress.   Neck: No JVD Cardiac: RRR, no murmurs, rubs, or gallops.  Respiratory: Clear to auscultation bilaterally. GI: Soft, nontender, non-distended  MS: No edema; No deformity. Neuro:  Nonfocal  Psych: Normal affect   Labs    High Sensitivity Troponin:  No results for input(s):  "TROPONINIHS" in the last 720 hours.   Chemistry Recent Labs  Lab 11/21/22 0351 11/22/22 0249 11/22/22 1702 11/23/22 0215 11/24/22 0228 11/25/22 0235  NA 139 136   < > 139 135 135  K 4.2 3.5   < > 4.7 3.8 3.4*  CL 105 106  --  103 91* 90*  CO2 21* 19*  --  GLUCOSE 136* 112*  --  93 103* 105*  BUN 14 12  --  16 25* 27*  CREATININE 1.49* 1.16*  --  1.20* 1.33* 1.38*  CALCIUM 8.3* 8.3*  --  8.6* 8.7* 8.5*  MG 2.0 2.1  --  1.8 1.5* 1.9  PROT 5.5* 5.6*  --  6.1*  --   --   ALBUMIN 2.8* 2.9*  --  3.1*  --   --   AST 108* 63*  --  74*  --   --   ALT 88* 77*  --  87*  --   --   ALKPHOS 78 92  --  105  --   --   BILITOT 1.7* 1.3*  --  1.5*  --   --  GFRNONAA 38* 51*  --  49* 43* 41*  ANIONGAP 13 11  --  11 16* 15   < > = values in this interval not displayed.     Lipids  Recent Labs  Lab 11/25/22 0235  CHOL 124  TRIG 211*  HDL 34*  LDLCALC 48  CHOLHDL 3.6    Hematology Recent Labs  Lab 11/23/22 0215 11/24/22 0228 11/25/22 0235  WBC 4.9 6.5 5.7  RBC 3.68* 3.96 4.12  HGB 12.9 13.7 14.4  HCT 38.6 40.4 41.3  MCV 104.9* 102.0* 100.2*  MCH 35.1* 34.6* 35.0*  MCHC 33.4 33.9 34.9  RDW 14.3 14.2 14.1  PLT 148* 155 120*    Thyroid  Recent Labs  Lab 11/19/22 0345 11/19/22 0941  TSH 6.980*  --   FREET4  --  0.71     BNP Recent Labs  Lab 11/20/22 1651  BNP 1,370.1*     DDimer  Recent Labs  Lab 11/20/22 1651  DDIMER 2.94*      Radiology    DG CHEST PORT 1 VIEW  Result Date: 11/23/2022 CLINICAL DATA:  Shortness of breath EXAM: PORTABLE CHEST 1 VIEW COMPARISON:  X-ray 11/21/2022 FINDINGS: Underinflation. Borderline cardiopericardial silhouette. No consolidation, pneumothorax or effusion. No edema. Overlapping cardiac leads. IMPRESSION: Underinflation. Borderline size heart. No acute cardiopulmonary disease Electronically Signed   By: Karen Kays M.D.   On: 11/23/2022 15:12    Cardiac Studies   Echo 11/20/2022 1. Very difficult study due to  poor echo windows and lack of  visualization of LV endocardium. However, based on limited views, there is  severe biventricular failure with estimated LVEF ~25% (endocardium very  poorly visualized) and severe RV dysfunction.   2. Left ventricular ejection fraction, by estimation, is 25 to 30%. The  left ventricle has severely decreased function. Left ventricular  endocardial border not optimally defined to evaluate regional wall motion.  There is mild left ventricular  hypertrophy. Left ventricular diastolic parameters are consistent with  Grade II diastolic dysfunction (pseudonormalization).   3. Right ventricular systolic function is severely reduced. The right  ventricular size is mildly enlarged.   4. Left atrial size was mildly dilated.   5. The mitral valve is degenerative. Mild to moderate mitral valve  regurgitation. Moderate mitral annular calcification.   6. The aortic valve is tricuspid. There is mild calcification of the  aortic valve. There is mild thickening of the aortic valve. Aortic valve  regurgitation is not visualized. Aortic valve sclerosis/calcification is  present, without any evidence of  aortic stenosis.   7. The inferior vena cava is dilated in size with <50% respiratory  variability, suggesting right atrial pressure of 15 mmHg.   8. Moderate pericardial effusion. There is no evidence of cardiac  tamponade.   Comparison(s): No prior Echocardiogram.    Cath 11/22/2022   Mid LAD lesion is 20% stenosed.   Mid Cx lesion is 20% stenosed.   Mild non-obstructive disease in the mid Circumflex and mid LAD Moderate caliber non-dominant RCA with no disease.  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 She has received IV Lasix this am. I will give another 40 mg of IV Lasix tonight. Medical management of mild  CAD  Flowsheet Row Most Recent Value  Fick Cardiac Output 4.13 L/min  Fick Cardiac Output Index 2.58 (L/min)/BSA  RA A Wave 22 mmHg  RA V Wave 18 mmHg  RA Mean 16 mmHg  RV Systolic Pressure 48 mmHg  RV Diastolic Pressure 4 mmHg  RV EDP 23 mmHg  PA Systolic Pressure 47 mmHg  PA Diastolic Pressure 29 mmHg  PA Mean 35 mmHg  PW A Wave 19 mmHg  PW V Wave 27 mmHg  PW Mean 24 mmHg  AO Systolic Pressure 127 mmHg  AO Diastolic Pressure 84 mmHg  AO Mean 101 mmHg  LV Systolic Pressure 132 mmHg  LV Diastolic Pressure 5 mmHg  LV EDP 18 mmHg  AOp Systolic Pressure 125 mmHg  AOp Diastolic Pressure 79 mmHg  AOp Mean Pressure 98 mmHg  LVp Systolic Pressure 121 mmHg  LVp Diastolic Pressure 15 mmHg  LVp EDP Pressure 18 mmHg  QP/QS 1  TPVR Index 13.58 HRUI  TSVR Index 39.19 HRUI  PVR SVR Ratio 0.13  TPVR/TSVR Ratio 0.35    Patient Profile     67 y.o. female with PMH of anxiety, HTN, IBS, GERD and sinus tachycardia who presented with R sided weakness. Echo showed severe biventricular failure with EF 25-30% and severe RV dysfunction.   Assessment & Plan    Severe biventricular heart failure  - Echo 11/20/2022 showed EF 25%, grade 2 DD, severely reduced RVEF, mild to moderate MR.   - L&RHC on 11/22/2022 showed minimal disease, CO 4.1, CI 2.58. Patient declined cardiac MRI.   - continue spironolactone, metoprolol succinate and jardiance. Low dose losartan added for this morning. Transitioning to oral lasix. No room to further uptitrate GDMT.   - will need PT eval and treatment today. Ambulate with assistance to make sure no significant dizziness or drop in BP.  Hypokalemia: K 2.1, improved with supplement. K 3.4 this morning, will give 40 meq KCl.   Elevated D-dimer: VQ scan negative  R sided weakness: CT and MRI of brain negative for acute intracranial pathology  Sinus tachycardia: metoprolol succinate increased to  yesterday, continue to have fast bursts  Frequent PVCs       For questions or updates, please contact [facility] Please consult [email or web address] for contact info under        Signed, Azalee Course, PA  11/25/2022, 7:23 AM

## 2022-11-25 NOTE — Progress Notes (Signed)
Mobility Specialist Progress Note:   11/25/22 1500  Mobility  Activity Ambulated with assistance in hallway  Level of Assistance Standby assist, set-up cues, supervision of patient - no hands on  Assistive Device Front wheel walker  Distance Ambulated (ft) 200 ft  Activity Response Tolerated well  Mobility Referral Yes  $Mobility charge 1 Mobility   Pt agreeable to mobility session. Required no physical assistance throughout. Pt left sitting EOB with all needs met.   Addison Lank Mobility Specialist Please contact via SecureChat or  Rehab office at [phone]

## 2022-11-25 NOTE — Care Management Important Message (Signed)
Important Message  Patient Details  Name: Kathleen Velazquez MRN: [id number] Date of Birth: Oct 04, 1955   Medicare Important Message Given:  Yes     Renie Ora 11/25/2022, 10:28 AM

## 2022-11-26 ENCOUNTER — Inpatient Hospital Stay (HOSPITAL_COMMUNITY): Payer: Medicare Other

## 2022-11-26 DIAGNOSIS — I5041 Acute combined systolic (congestive) and diastolic (congestive) heart failure: Secondary | ICD-10-CM | POA: Diagnosis not present

## 2022-11-26 DIAGNOSIS — M7989 Other specified soft tissue disorders: Secondary | ICD-10-CM

## 2022-11-26 DIAGNOSIS — R000 Tachycardia, unspecified: Secondary | ICD-10-CM | POA: Diagnosis not present

## 2022-11-26 DIAGNOSIS — I2510 Atherosclerotic heart disease of native coronary artery without angina pectoris: Secondary | ICD-10-CM

## 2022-11-26 DIAGNOSIS — E782 Mixed hyperlipidemia: Secondary | ICD-10-CM | POA: Diagnosis not present

## 2022-11-26 DIAGNOSIS — R531 Weakness: Secondary | ICD-10-CM | POA: Diagnosis not present

## 2022-11-26 DIAGNOSIS — I952 Hypotension due to drugs: Secondary | ICD-10-CM

## 2022-11-26 DIAGNOSIS — E876 Hypokalemia: Secondary | ICD-10-CM | POA: Diagnosis not present

## 2022-11-26 DIAGNOSIS — I493 Ventricular premature depolarization: Secondary | ICD-10-CM | POA: Diagnosis not present

## 2022-11-26 DIAGNOSIS — I50810 Right heart failure, unspecified: Secondary | ICD-10-CM | POA: Diagnosis not present

## 2022-11-26 LAB — PHOSPHORUS: Phosphorus: 4.5 mg/dL (ref 2.5–4.6)

## 2022-11-26 LAB — MAGNESIUM: Magnesium: 1.9 mg/dL (ref 1.7–2.4)

## 2022-11-26 LAB — BASIC METABOLIC PANEL
Anion gap: 11 (ref 5–15)
BUN: 36 mg/dL — ABNORMAL HIGH (ref 8–23)
CO2: 26 mmol/L (ref 22–32)
Calcium: 8.7 mg/dL — ABNORMAL LOW (ref 8.9–10.3)
Chloride: 93 mmol/L — ABNORMAL LOW (ref 98–111)
Creatinine, Ser: 1.29 mg/dL — ABNORMAL HIGH (ref 0.44–1.00)
GFR, Estimated: 45 mL/min — ABNORMAL LOW (ref >60)
Glucose, Bld: 112 mg/dL — ABNORMAL HIGH (ref 70–99)
Potassium: 3.6 mmol/L (ref 3.5–5.1)
Sodium: 130 mmol/L — ABNORMAL LOW (ref 135–145)

## 2022-11-26 LAB — CBC WITH DIFFERENTIAL/PLATELET
Abs Immature Granulocytes: 0.04 10*3/uL (ref 0.00–0.07)
Basophils Absolute: 0.1 10*3/uL (ref 0.0–0.1)
Basophils Relative: 1 %
Eosinophils Absolute: 0 10*3/uL (ref 0.0–0.5)
Eosinophils Relative: 0 %
HCT: 36.9 % (ref 36.0–46.0)
Hemoglobin: 12.6 g/dL (ref 12.0–15.0)
Immature Granulocytes: 1 %
Lymphocytes Relative: 29 %
Lymphs Abs: 1.8 10*3/uL (ref 0.7–4.0)
MCH: 34.4 pg — ABNORMAL HIGH (ref 26.0–34.0)
MCHC: 34.1 g/dL (ref 30.0–36.0)
MCV: 100.8 fL — ABNORMAL HIGH (ref 80.0–100.0)
Monocytes Absolute: 1.2 10*3/uL — ABNORMAL HIGH (ref 0.1–1.0)
Monocytes Relative: 19 %
Neutro Abs: 3 10*3/uL (ref 1.7–7.7)
Neutrophils Relative %: 50 %
Platelets: 153 10*3/uL (ref 150–400)
RBC: 3.66 MIL/uL — ABNORMAL LOW (ref 3.87–5.11)
RDW: 13.9 % (ref 11.5–15.5)
WBC: 6 10*3/uL (ref 4.0–10.5)
nRBC: 0 % (ref 0.0–0.2)

## 2022-11-26 MED ORDER — EMPAGLIFLOZIN 10 MG PO TABS: 10.0000 mg | ORAL_TABLET | Freq: Every day | ORAL | 2 refills | Status: DC

## 2022-11-26 MED ORDER — ROSUVASTATIN CALCIUM 10 MG PO TABS: 10.0000 mg | ORAL_TABLET | Freq: Every day | ORAL | 2 refills | Status: AC

## 2022-11-26 MED ORDER — ROSUVASTATIN CALCIUM 5 MG PO TABS
10.0000 mg | ORAL_TABLET | Freq: Every day | ORAL | Status: DC
  Administered 2022-11-26 – 2022-11-27 (×2): 10 mg via ORAL
  Filled 2022-11-26 (×2): qty 2

## 2022-11-26 MED ORDER — PANTOPRAZOLE SODIUM 40 MG PO TBEC: 40.0000 mg | DELAYED_RELEASE_TABLET | Freq: Every day | ORAL | 1 refills | Status: DC

## 2022-11-26 MED ORDER — DOXYCYCLINE HYCLATE 100 MG PO TABS
100.0000 mg | ORAL_TABLET | Freq: Two times a day (BID) | ORAL | Status: DC
  Administered 2022-11-26 – 2022-11-27 (×2): 100 mg via ORAL
  Filled 2022-11-26 (×2): qty 1

## 2022-11-26 MED ORDER — DOXYCYCLINE HYCLATE 100 MG PO CAPS: 100.0000 mg | ORAL_CAPSULE | Freq: Two times a day (BID) | ORAL | 0 refills | Status: AC

## 2022-11-26 MED ORDER — FUROSEMIDE 20 MG PO TABS: 20.0000 mg | ORAL_TABLET | Freq: Every day | ORAL | 1 refills | Status: DC | PRN

## 2022-11-26 MED ORDER — METOPROLOL SUCCINATE ER 50 MG PO TB24: 50.0000 mg | ORAL_TABLET | Freq: Every day | ORAL | 2 refills | Status: DC

## 2022-11-26 MED ORDER — BENZONATATE 100 MG PO CAPS: 100.0000 mg | ORAL_CAPSULE | Freq: Three times a day (TID) | ORAL | 0 refills | Status: DC | PRN

## 2022-11-26 MED ORDER — SPIRONOLACTONE 25 MG PO TABS: 12.5000 mg | ORAL_TABLET | Freq: Every day | ORAL | 2 refills | Status: DC

## 2022-11-26 NOTE — Progress Notes (Signed)
Patient ambulated 150 ft in hallway with walker and standby assist only.  States she feels weak in her legs, but tolerated without problems.

## 2022-11-26 NOTE — Progress Notes (Signed)
[facility]                                                                               Kathleen Velazquez, is a 67 y.o. female, DOB - 1956-03-02, ZOX:[id number] Admit date - 11/18/2022    Outpatient Primary MD for the patient is Kathleen Deiters, MD  LOS - 7  days    Brief summary    Kathleen Velazquez is a 67 y.o. female with a hx of anxiety, GERD, HTN, IBS, and sinus tachycardia presents with right sided weakness.   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  - she was started on IV lasix 40 mg BID transitioned to oral lasix 40 mg daily but discontinued to low BP, spironolactone 12.5 mg daily.  Cozaar held due to hypotension.  - continue with strict intake and output. Daily weights.  - echocardiogram showed there is  severe biventricular failure with estimated LVEF ~25%  and severe RV dysfunction.  FARXIGA 10 mg daily added to her regimen.      * Right sided weakness CT and MRI of the brain negative for acute pathology and stroke.  Neurology on board and suggested possibly from hypokalemia.  Therapy evaluations ordered.  But patient refused home health PT   Hyperlipidemia - Continue home gemfibrozil  IBS (irritable bowel syndrome) Stable.   Hypokalemia Replaced .   SVT: Increase metoprolol succinate to 50 mg daily.   Non obstructive CAD;   Cough:  Intermittent, .  CXR is negative for acute cardiopulmonary disease.  CT chest with out contrast showed Two adjacent small semi-solid lung nodules of the right costophrenic angle largest measuring 6 mm. Non-contrast chest CT at 3-6 months is Recommended. Possibly her cough could be from hiatal hernia and reflux.  Will add PPI to her regimen and monitor.   Right forearm SVT with redness and tenderness Will add doxycycline.  Venous duplex of the upper  extremity reviewed.   Estimated body mass index is 22.9 kg/m as calculated from the following:   Height as of this encounter: 5\' 2"  (1.575 m).   Weight as of this encounter: 56.8 kg.  Code Status: full code.  DVT Prophylaxis:  SCD's Start: 11/22/22 1739 SCDs Start: 11/19/22 0305   Level of Care: Level of care: Progressive Family Communication: none at bedside.   Disposition Plan:     Remains inpatient appropriate:  discharge home in am.  Procedures:  Cardiac cath on 4/16  Consultants:   Cardiology.   Antimicrobials:   Anti-infectives (From admission, onward)    Start     Dose/Rate Route Frequency Ordered Stop   11/26/22 0000  doxycycline (VIBRAMYCIN) 100 MG capsule        100 mg Oral 2 times daily 11/26/22 1516 12/01/22 2359        Medications  Scheduled Meds:  empagliflozin  10 mg Oral Daily   gemfibrozil  600 mg Oral BID AC   losartan  12.5 mg Oral Daily   melatonin  3 mg Oral  QHS   metoprolol succinate  50 mg Oral Daily   pantoprazole  40 mg Oral Q0600   rosuvastatin  10 mg Oral Daily   sodium chloride flush  3 mL Intravenous Q12H   spironolactone  12.5 mg Oral Daily   Continuous Infusions:  sodium chloride     PRN Meds:.sodium chloride, acetaminophen **OR** acetaminophen, benzonatate, loperamide, oxyCODONE, sodium chloride flush    Subjective:   Kathleen Velazquez was seen and examined today.   Pt reports that she does not want to go home today.  She wants to go home in the morning.   Objective:   Vitals:   11/26/22 1146 11/26/22 1254 11/26/22 1303 11/26/22 1403  BP: 99/70 110/63 113/69 100/64  Pulse: 90 86 99 82  Resp: Temp: 97.8 F (36.6 C)   97.8 F (36.6 C)  TempSrc: Oral   Oral  SpO2: 97%   98%  Weight:      Height:        Intake/Output Summary (Last 24 hours) at 11/26/2022 1617 Last data filed at 11/26/2022 0900 Gross per 24 hour  Intake 240 ml  Output 300 ml  Net -60 ml    Filed Weights   11/24/22 0523 11/25/22 0525  11/26/22 0651  Weight: 57.9 kg 55.9 kg 56.8 kg     Exam General exam: Appears calm and comfortable  Respiratory system: Clear to auscultation. Respiratory effort normal. Cardiovascular system: S1 & S2 heard, tachycardic, no JVD. No pedal edema. Gastrointestinal system: Abdomen is nondistended, soft and nontender.  Central nervous system: Alert and oriented. No focal neurological deficits. Extremities: right forearm redness and tenderness.  Skin: No rashes,  Psychiatry:  Mood & affect appropriate.      Data Reviewed:  I have personally reviewed following labs and imaging studies   CBC Lab Results  Component Value Date   WBC 6.0 11/26/2022   RBC 3.66 (L) 11/26/2022   HGB 12.6 11/26/2022   HCT 36.9 11/26/2022   MCV 100.8 (H) 11/26/2022   MCH 34.4 (H) 11/26/2022   PLT 153 11/26/2022   MCHC 34.1 11/26/2022   RDW 13.9 11/26/2022   LYMPHSABS 1.8 11/26/2022   MONOABS 1.2 (H) 11/26/2022   EOSABS 0.0 11/26/2022   BASOSABS 0.1 11/26/2022     Last metabolic panel Lab Results  Component Value Date   NA 130 (L) 11/26/2022   K 3.6 11/26/2022   CL 93 (L) 11/26/2022   CO2 26 11/26/2022   BUN 36 (H) 11/26/2022   CREATININE 1.29 (H) 11/26/2022   GLUCOSE 112 (H) 11/26/2022   GFRNONAA 45 (L) 11/26/2022   GFRAA >60 02/28/2020   CALCIUM 8.7 (L) 11/26/2022   PHOS 4.5 11/26/2022   PROT 6.1 (L) 11/23/2022   ALBUMIN 3.1 (L) 11/23/2022   BILITOT 1.5 (H) 11/23/2022   ALKPHOS 105 11/23/2022   AST 74 (H) 11/23/2022   ALT 87 (H) 11/23/2022   ANIONGAP 11 11/26/2022    CBG (last 3)  No results for input(s): "GLUCAP" in the last 72 hours.    Coagulation Profile: Recent Labs  Lab 11/21/22 0938  INR 1.2      Radiology Studies: VAS [place] UPPER EXTREMITY VENOUS DUPLEX  Result Date: 11/26/2022 UPPER VENOUS STUDY  Patient Name:  Kathleen Velazquez  Date of Exam:   11/26/2022 Medical Rec #: [id number]        Accession #:    [id number] Date of Birth: 1955/11/21        Patient Gender:  F  Patient Age:   33 years Exam Location:  [facility] Procedure:      VAS [place] UPPER EXTREMITY VENOUS DUPLEX Referring Phys: Kathleen Velazquez --------------------------------------------------------------------------------  Indications: Right forearm focal area of erythema, swelling, and pain post-IV. Comparison Study: No prior studies. Performing Technologist: Jean Rosenthal RDMS, RVT  Examination Guidelines: A complete evaluation includes B-mode imaging, spectral Doppler, color Doppler, and power Doppler as needed of all accessible portions of each vessel. Bilateral testing is considered an integral part of a complete examination. Limited examinations for reoccurring indications may be performed as noted.  Right Findings: +----------+------------+---------+-----------+------------------------+-------+ RIGHT     CompressiblePhasicitySpontaneous       Properties       Summary +----------+------------+---------+-----------+------------------------+-------+ IJV           Full       Yes       Yes                                    +----------+------------+---------+-----------+------------------------+-------+ Subclavian               Yes       Yes                                    +----------+------------+---------+-----------+------------------------+-------+ Axillary      Full       Yes       Yes                                    +----------+------------+---------+-----------+------------------------+-------+ Brachial      Full                                                        +----------+------------+---------+-----------+------------------------+-------+ Radial        Full                                                        +----------+------------+---------+-----------+------------------------+-------+ Ulnar         Full                                                        +----------+------------+---------+-----------+------------------------+-------+  Cephalic      None       No        No       Mid forearm area of                                                             concern                 +----------+------------+---------+-----------+------------------------+-------+  Basilic       Full                                                        +----------+------------+---------+-----------+------------------------+-------+  Left Findings: +----------+------------+---------+-----------+----------+-------+ LEFT      CompressiblePhasicitySpontaneousPropertiesSummary +----------+------------+---------+-----------+----------+-------+ Subclavian               Yes       Yes                      +----------+------------+---------+-----------+----------+-------+  Summary:  Right: No evidence of deep vein thrombosis in the upper extremity. Findings consistent with acute superficial vein thrombosis involving the right cephalic vein.  Left: No evidence of thrombosis in the subclavian.  *See table(s) above for measurements and observations.    Preliminary    CT CHEST WO CONTRAST  Result Date: 11/25/2022 CLINICAL DATA:  Respiratory illness. EXAM: CT CHEST WITHOUT CONTRAST TECHNIQUE: Multidetector CT imaging of the chest was performed following the standard protocol without IV contrast. RADIATION DOSE REDUCTION: This exam was performed according to the departmental dose-optimization program which includes automated exposure control, adjustment of the mA and/or kV according to patient size and/or use of iterative reconstruction technique. COMPARISON:  X-ray 11/23/2022 and older.  CT scan December 2017 FINDINGS: Cardiovascular: Small pericardial effusion. There is prominent epicardial fat. The heart itself is nonenlarged. The thoracic aorta has a normal course and caliber on this non IV contrast exam with mild calcified plaque. Significant coronary artery calcifications are seen. Mediastinum/Nodes: Large hiatal hernia. Slightly patulous  esophagus above the hernia. Small thyroid gland. No specific abnormal lymph node enlargement identified in the axillary regions, hilum or mediastinum. Lungs/Pleura: No consolidation, pneumothorax or effusion. Mild basilar scar or atelectasis. Small ill-defined nodular area right lower lobe measuring 4 mm. Semi-solid nodule. Similar focus just caudal to this measuring 6 mm on series 4 image 114 as well. Upper Abdomen: The adrenal glands are preserved in the upper abdomen. Previous cholecystectomy. Musculoskeletal: Healed right-sided rib fracture. Minimal degenerative changes along the spine IMPRESSION: Two adjacent small semi-solid lung nodules of the right costophrenic angle largest measuring 6 mm. Non-contrast chest CT at 3-6 months is recommended. If nodules persist, subsequent management will be based upon the most suspicious nodule(s). This recommendation follows the consensus statement: Guidelines for Management of Incidental Pulmonary Nodules Detected on CT Images: From the Fleischner Society 2017; Radiology 2017; 284:228-243. Large hiatal hernia with a slightly patulous esophagus. Significant coronary artery calcifications. Please correlate for other coronary risk factors Aortic Atherosclerosis (ICD10-I70.0). Electronically Signed   By: Karen Kays M.D.   On: 11/25/2022 14:09       Kathleen Velazquez M.D. [facility] 11/26/2022, 4:17 PM  Available via Epic secure chat 7am-7pm After 7 pm, please refer to night coverage provider listed on amion.

## 2022-11-26 NOTE — Progress Notes (Signed)
BP 91/64, HR 94. Patient sitting on side of bed.  Denies dizziness.  Due for lopressor, Cozaar and lasix this morning.  Cardiology notified, advised to hold Cozaar and give lasix and metoprolol at this time. Will continue to monitor.

## 2022-11-26 NOTE — Progress Notes (Signed)
Rounding Note    Patient Name: Kathleen Velazquez Date of Encounter: 11/26/2022  [facility] Cardiologist: Meriam Sprague, MD   Subjective   This AM, BP 91/64, asymptomatic. Losartan held, lasix and metoprolol given. Overall she feels fatigued and weak. Reviewed her entire admission, testing, findings, recommendations today. She notes that it "goes in one ear and out the other." We reviewed the plan, see below. She is hoping to be able to go home soon.  Inpatient Medications    Scheduled Meds:  empagliflozin  10 mg Oral Daily   furosemide  40 mg Oral Daily   gemfibrozil  600 mg Oral BID AC   losartan  12.5 mg Oral Daily   melatonin  3 mg Oral QHS   metoprolol succinate  50 mg Oral Daily   pantoprazole  40 mg Oral Q0600   sodium chloride flush  3 mL Intravenous Q12H   spironolactone  12.5 mg Oral Daily   Continuous Infusions:  sodium chloride     PRN Meds: sodium chloride, acetaminophen **OR** acetaminophen, benzonatate, loperamide, oxyCODONE, sodium chloride flush   Vital Signs    Vitals:   11/26/22 0430 11/26/22 0600 11/26/22 0651 11/26/22 0821  BP: (!) 88/71 97/62  91/64  Pulse: 87   94  Resp: 16   18  Temp: 98.2 F (36.8 C)   97.8 F (36.6 C)  TempSrc: Oral   Oral  SpO2: 96%   98%  Weight:   56.8 kg   Height:        Intake/Output Summary (Last 24 hours) at 11/26/2022 0952 Last data filed at 11/26/2022 0145 Gross per 24 hour  Intake --  Output 300 ml  Net -300 ml      11/26/2022    6:51 AM 11/25/2022    5:25 AM 11/24/2022    5:23 AM  Last 3 Weights  Weight (lbs) 125 lb 3.5 oz 123 lb 4.8 oz 127 lb 10.3 oz  Weight (kg) 56.8 kg 55.929 kg 57.9 kg      Telemetry    Sinus with frequent ectopy (PACs, PVCs) - Personally Reviewed  ECG    No new since 11/24/22 - Personally Reviewed  Physical Exam   GEN: No acute distress.   Neck: No JVD Cardiac: largely regular but frequent ectopy, no murmurs, rubs, or gallops.  Respiratory: Clear to  auscultation bilaterally. GI: Soft, nontender, non-distended  MS: No edema; No deformity. Neuro:  Nonfocal  Psych: Normal affect   Labs    High Sensitivity Troponin:  No results for input(s): "TROPONINIHS" in the last 720 hours.   Chemistry Recent Labs  Lab 11/21/22 0351 11/22/22 0249 11/22/22 1702 11/23/22 0215 11/24/22 0228 11/25/22 0235 11/26/22 0234  NA 139 136   < > 139 135 135 130*  K 4.2 3.5   < > 4.7 3.8 3.4* 3.6  CL 105 106  --  103 91* 90* 93*  CO2 21* 19*  --  GLUCOSE 136* 112*  --  93 103* 105* 112*  BUN 14 12  --  16 25* 27* 36*  CREATININE 1.49* 1.16*  --  1.20* 1.33* 1.38* 1.29*  CALCIUM 8.3* 8.3*  --  8.6* 8.7* 8.5* 8.7*  MG 2.0 2.1  --  1.8 1.5* 1.9 1.9  PROT 5.5* 5.6*  --  6.1*  --   --   --   ALBUMIN 2.8* 2.9*  --  3.1*  --   --   --  AST 108* 63*  --  74*  --   --   --   ALT 88* 77*  --  87*  --   --   --   ALKPHOS 78 92  --  105  --   --   --   BILITOT 1.7* 1.3*  --  1.5*  --   --   --   GFRNONAA 38* 51*  --  49* 43* 41* 45*  ANIONGAP 13 11  --  11 16* 15 11   < > = values in this interval not displayed.    Lipids  Recent Labs  Lab 11/25/22 0235  CHOL 124  TRIG 211*  HDL 34*  LDLCALC 48  CHOLHDL 3.6    Hematology Recent Labs  Lab 11/24/22 0228 11/25/22 0235 11/26/22 0234  WBC 6.5 5.7 6.0  RBC 3.96 4.12 3.66*  HGB 13.7 14.4 12.6  HCT 40.4 41.3 36.9  MCV 102.0* 100.2* 100.8*  MCH 34.6* 35.0* 34.4*  MCHC 33.9 34.9 34.1  RDW 14.2 14.1 13.9  PLT 155 120* 153   Thyroid No results for input(s): "TSH", "FREET4" in the last 168 hours.  BNP Recent Labs  Lab 11/20/22 1651  BNP 1,370.1*    DDimer  Recent Labs  Lab 11/20/22 1651  DDIMER 2.94*     Radiology    CT CHEST WO CONTRAST  Result Date: 11/25/2022 CLINICAL DATA:  Respiratory illness. EXAM: CT CHEST WITHOUT CONTRAST TECHNIQUE: Multidetector CT imaging of the chest was performed following the standard protocol without IV contrast. RADIATION DOSE REDUCTION:  This exam was performed according to the departmental dose-optimization program which includes automated exposure control, adjustment of the mA and/or kV according to patient size and/or use of iterative reconstruction technique. COMPARISON:  X-ray 11/23/2022 and older.  CT scan December 2017 FINDINGS: Cardiovascular: Small pericardial effusion. There is prominent epicardial fat. The heart itself is nonenlarged. The thoracic aorta has a normal course and caliber on this non IV contrast exam with mild calcified plaque. Significant coronary artery calcifications are seen. Mediastinum/Nodes: Large hiatal hernia. Slightly patulous esophagus above the hernia. Small thyroid gland. No specific abnormal lymph node enlargement identified in the axillary regions, hilum or mediastinum. Lungs/Pleura: No consolidation, pneumothorax or effusion. Mild basilar scar or atelectasis. Small ill-defined nodular area right lower lobe measuring 4 mm. Semi-solid nodule. Similar focus just caudal to this measuring 6 mm on series 4 image 114 as well. Upper Abdomen: The adrenal glands are preserved in the upper abdomen. Previous cholecystectomy. Musculoskeletal: Healed right-sided rib fracture. Minimal degenerative changes along the spine IMPRESSION: Two adjacent small semi-solid lung nodules of the right costophrenic angle largest measuring 6 mm. Non-contrast chest CT at 3-6 months is recommended. If nodules persist, subsequent management will be based upon the most suspicious nodule(s). This recommendation follows the consensus statement: Guidelines for Management of Incidental Pulmonary Nodules Detected on CT Images: From the Fleischner Society 2017; Radiology 2017; 284:228-243. Large hiatal hernia with a slightly patulous esophagus. Significant coronary artery calcifications. Please correlate for other coronary risk factors Aortic Atherosclerosis (ICD10-I70.0). Electronically Signed   By: Karen Kays M.D.   On: 11/25/2022 14:09     Cardiac Studies   Echo 11/20/2022 1. Very difficult study due to poor echo windows and lack of  visualization of LV endocardium. However, based on limited views, there is  severe biventricular failure with estimated LVEF ~25% (endocardium very  poorly visualized) and severe RV dysfunction.   2. Left ventricular ejection fraction, by estimation,  is 25 to 30%. The  left ventricle has severely decreased function. Left ventricular  endocardial border not optimally defined to evaluate regional wall motion.  There is mild left ventricular  hypertrophy. Left ventricular diastolic parameters are consistent with  Grade II diastolic dysfunction (pseudonormalization).   3. Right ventricular systolic function is severely reduced. The right  ventricular size is mildly enlarged.   4. Left atrial size was mildly dilated.   5. The mitral valve is degenerative. Mild to moderate mitral valve  regurgitation. Moderate mitral annular calcification.   6. The aortic valve is tricuspid. There is mild calcification of the  aortic valve. There is mild thickening of the aortic valve. Aortic valve  regurgitation is not visualized. Aortic valve sclerosis/calcification is  present, without any evidence of  aortic stenosis.   7. The inferior vena cava is dilated in size with <50% respiratory  variability, suggesting right atrial pressure of 15 mmHg.   8. Moderate pericardial effusion. There is no evidence of cardiac  tamponade.   Comparison(s): No prior Echocardiogram.      Cath 11/22/2022   Mid LAD lesion is 20% stenosed.   Mid Cx lesion is 20% stenosed.   Mild non-obstructive disease in the mid Circumflex and mid LAD Moderate caliber non-dominant RCA with no disease.  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 She has received IV Lasix this am. I will give  another 40 mg of IV Lasix tonight. Medical management of mild CAD   Flowsheet Row Most Recent Value  Fick Cardiac Output 4.13 L/min  Fick Cardiac Output Index 2.58 (L/min)/BSA  RA A Wave 22 mmHg  RA V Wave 18 mmHg  RA Mean 16 mmHg  RV Systolic Pressure 48 mmHg  RV Diastolic Pressure 4 mmHg  RV EDP 23 mmHg  PA Systolic Pressure 47 mmHg  PA Diastolic Pressure 29 mmHg  PA Mean 35 mmHg  PW A Wave 19 mmHg  PW V Wave 27 mmHg  PW Mean 24 mmHg  AO Systolic Pressure 127 mmHg  AO Diastolic Pressure 84 mmHg  AO Mean 101 mmHg  LV Systolic Pressure 132 mmHg  LV Diastolic Pressure 5 mmHg  LV EDP 18 mmHg  AOp Systolic Pressure 125 mmHg  AOp Diastolic Pressure 79 mmHg  AOp Mean Pressure 98 mmHg  LVp Systolic Pressure 121 mmHg  LVp Diastolic Pressure 15 mmHg  LVp EDP Pressure 18 mmHg  QP/QS 1  TPVR Index 13.58 HRUI  TSVR Index 39.19 HRUI  PVR SVR Ratio 0.13  TPVR/TSVR Ratio 0.35    Patient Profile     67 y.o. female without prior cardiac history. Presented with R sided weakness. Echo with severe biventricular failure, cath without obstructive CAD.  Assessment & Plan    Biventricular heart failure Nonischemic cardiomyopathy PVCs, atrial tachycardia, NSVT -LVEF 25-30%, RV function also reduced -nonischemic based on cath -declines cardiac MRI -GDMT: BP too low for entresto. Started on losartan, spironolactone, empagliflozin, and metoprolol succinate this admission -with low BP this AM, will change lasix to PRN for weight gain/swelling more than 3 lbs overnight or 5 lbs from baseline. With SGLT2i and MRA, suspect she will not need lasix routinely  Nonobstructive CAD Hyperlipidemia, primarily elevated triglycerides -continue gemfibrozil for now, though with start of statin could potentially stop in the future. She has longstanding issues with diarrhea, this may improve with stopping gemfibrozil -LDL 48,  HDL 34 -had rectal bleeding with heparin. Hold aspirin for now -would benefit  from statin, we discussed today, she is amenable.  Elevated D-dimer: VQ negative R sided weakness: CT and MRI negative for acute intracranial pathology  If her BP improves and she is able to ambulate this afternoon without symptoms or drop in blood pressure, could potentially be discharged later today. If her BP remains low, will need to stop spironolactone and monitor BP tomorrow.  For questions or updates, please contact [facility] Please consult [email or web address] for contact info under     Signed, Jodelle Red, MD  11/26/2022, 9:52 AM

## 2022-11-27 DIAGNOSIS — I5041 Acute combined systolic (congestive) and diastolic (congestive) heart failure: Secondary | ICD-10-CM | POA: Diagnosis not present

## 2022-11-27 DIAGNOSIS — I428 Other cardiomyopathies: Secondary | ICD-10-CM | POA: Diagnosis not present

## 2022-11-27 DIAGNOSIS — E876 Hypokalemia: Secondary | ICD-10-CM | POA: Diagnosis not present

## 2022-11-27 DIAGNOSIS — R531 Weakness: Secondary | ICD-10-CM | POA: Diagnosis not present

## 2022-11-27 DIAGNOSIS — E782 Mixed hyperlipidemia: Secondary | ICD-10-CM | POA: Diagnosis not present

## 2022-11-27 LAB — CBC WITH DIFFERENTIAL/PLATELET
Abs Immature Granulocytes: 0.04 10*3/uL (ref 0.00–0.07)
Basophils Absolute: 0.1 10*3/uL (ref 0.0–0.1)
Basophils Relative: 1 %
Eosinophils Absolute: 0 10*3/uL (ref 0.0–0.5)
Eosinophils Relative: 0 %
HCT: 37.8 % (ref 36.0–46.0)
Hemoglobin: 13.1 g/dL (ref 12.0–15.0)
Immature Granulocytes: 1 %
Lymphocytes Relative: 27 %
Lymphs Abs: 1.9 10*3/uL (ref 0.7–4.0)
MCH: 34.6 pg — ABNORMAL HIGH (ref 26.0–34.0)
MCHC: 34.7 g/dL (ref 30.0–36.0)
MCV: 99.7 fL (ref 80.0–100.0)
Monocytes Absolute: 1.1 10*3/uL — ABNORMAL HIGH (ref 0.1–1.0)
Monocytes Relative: 15 %
Neutro Abs: 3.8 10*3/uL (ref 1.7–7.7)
Neutrophils Relative %: 56 %
Platelets: 168 10*3/uL (ref 150–400)
RBC: 3.79 MIL/uL — ABNORMAL LOW (ref 3.87–5.11)
RDW: 13.7 % (ref 11.5–15.5)
WBC: 6.9 10*3/uL (ref 4.0–10.5)
nRBC: 0 % (ref 0.0–0.2)

## 2022-11-27 LAB — BASIC METABOLIC PANEL
Anion gap: 15 (ref 5–15)
BUN: 37 mg/dL — ABNORMAL HIGH (ref 8–23)
CO2: 24 mmol/L (ref 22–32)
Calcium: 9.2 mg/dL (ref 8.9–10.3)
Chloride: 96 mmol/L — ABNORMAL LOW (ref 98–111)
Creatinine, Ser: 1.59 mg/dL — ABNORMAL HIGH (ref 0.44–1.00)
GFR, Estimated: 35 mL/min — ABNORMAL LOW (ref >60)
Glucose, Bld: 106 mg/dL — ABNORMAL HIGH (ref 70–99)
Potassium: 3.6 mmol/L (ref 3.5–5.1)
Sodium: 135 mmol/L (ref 135–145)

## 2022-11-27 LAB — PHOSPHORUS: Phosphorus: 4.5 mg/dL (ref 2.5–4.6)

## 2022-11-27 LAB — MAGNESIUM: Magnesium: 1.8 mg/dL (ref 1.7–2.4)

## 2022-11-27 MED ORDER — LOSARTAN POTASSIUM 25 MG PO TABS: 12.5000 mg | ORAL_TABLET | Freq: Every day | ORAL | 1 refills | Status: DC

## 2022-11-27 NOTE — Progress Notes (Signed)
Rounding Note    Patient Name: Kathleen Velazquez Date of Encounter: 11/27/2022   [facility] Cardiologist: Meriam Sprague, MD   Subjective   No acute events overnight. No dizziness/lightheadedness. Hoping to go home today.  Inpatient Medications    Scheduled Meds:  doxycycline  100 mg Oral Q12H   empagliflozin  10 mg Oral Daily   gemfibrozil  600 mg Oral BID AC   losartan  12.5 mg Oral Daily   melatonin  3 mg Oral QHS   metoprolol succinate  50 mg Oral Daily   pantoprazole  40 mg Oral Q0600   rosuvastatin  10 mg Oral Daily   sodium chloride flush  3 mL Intravenous Q12H   spironolactone  12.5 mg Oral Daily   Continuous Infusions:  sodium chloride     PRN Meds: sodium chloride, acetaminophen **OR** acetaminophen, benzonatate, loperamide, oxyCODONE, sodium chloride flush   Vital Signs    Vitals:   11/26/22 1722 11/26/22 2034 11/27/22 0509 11/27/22 0900  BP: 111/62 107/64 109/74 114/74  Pulse: 86 74 60 97  Resp: Temp:  98.1 F (36.7 C) 98 F (36.7 C) 97.8 F (36.6 C)  TempSrc:  Oral Oral Oral  SpO2:  96% 95% 98%  Weight:   59.3 kg   Height:        Intake/Output Summary (Last 24 hours) at 11/27/2022 1107 Last data filed at 11/26/2022 2200 Gross per 24 hour  Intake 480 ml  Output 850 ml  Net -370 ml      11/27/2022    5:09 AM 11/26/2022    6:51 AM 11/25/2022    5:25 AM  Last 3 Weights  Weight (lbs) 130 lb 11.7 oz 125 lb 3.5 oz 123 lb 4.8 oz  Weight (kg) 59.3 kg 56.8 kg 55.929 kg      Telemetry    Sinus with frequent ectopy (PACs, PVCs) - Personally Reviewed  ECG    No new since 11/24/22 - Personally Reviewed  Physical Exam   GEN: Well nourished, well developed in no acute distress NECK: No JVD CARDIAC: regular rhythm with frequent ectopy, normal S1 and S2, no rubs or gallops. No murmur. VASCULAR: Radial pulses 2+ bilaterally.  RESPIRATORY:  Clear to auscultation without rales, wheezing or rhonchi  ABDOMEN: Soft,  non-tender, non-distended MUSCULOSKELETAL:  Moves all 4 limbs independently SKIN: Warm and dry, no edema NEUROLOGIC:  No focal neuro deficits noted. PSYCHIATRIC:  Normal affect    Labs    High Sensitivity Troponin:  No results for input(s): "TROPONINIHS" in the last 720 hours.   Chemistry Recent Labs  Lab 11/21/22 0351 11/22/22 0249 11/22/22 1702 11/23/22 0215 11/24/22 0228 11/25/22 0235 11/26/22 0234 11/27/22 0459  NA 139 136   < > 139   < > 135 130* 135  K 4.2 3.5   < > 4.7   < > 3.4* 3.6 3.6  CL 105 106  --  103   < > 90* 93* 96*  CO2 21* 19*  --  25   < > GLUCOSE 136* 112*  --  93   < > 105* 112* 106*  BUN 14 12  --  16   < > 27* 36* 37*  CREATININE 1.49* 1.16*  --  1.20*   < > 1.38* 1.29* 1.59*  CALCIUM 8.3* 8.3*  --  8.6*   < > 8.5* 8.7* 9.2  MG 2.0 2.1  --  1.8   < >  1.9 1.9 1.8  PROT 5.5* 5.6*  --  6.1*  --   --   --   --   ALBUMIN 2.8* 2.9*  --  3.1*  --   --   --   --   AST 108* 63*  --  74*  --   --   --   --   ALT 88* 77*  --  87*  --   --   --   --   ALKPHOS 78 92  --  105  --   --   --   --   BILITOT 1.7* 1.3*  --  1.5*  --   --   --   --   GFRNONAA 38* 51*  --  49*   < > 41* 45* 35*  ANIONGAP 13 11  --  11   < > 15 11 15    < > = values in this interval not displayed.    Lipids  Recent Labs  Lab 11/25/22 0235  CHOL 124  TRIG 211*  HDL 34*  LDLCALC 48  CHOLHDL 3.6    Hematology Recent Labs  Lab 11/25/22 0235 11/26/22 0234 11/27/22 0459  WBC 5.7 6.0 6.9  RBC 4.12 3.66* 3.79*  HGB 14.4 12.6 13.1  HCT 41.3 36.9 37.8  MCV 100.2* 100.8* 99.7  MCH 35.0* 34.4* 34.6*  MCHC 34.9 34.1 34.7  RDW 14.1 13.9 13.7  PLT 120* 153 168   Thyroid No results for input(s): "TSH", "FREET4" in the last 168 hours.  BNP Recent Labs  Lab 11/20/22 1651  BNP 1,370.1*    DDimer  Recent Labs  Lab 11/20/22 1651  DDIMER 2.94*     Radiology    VAS [place] UPPER EXTREMITY VENOUS DUPLEX  Result Date: 11/26/2022 UPPER VENOUS STUDY  Patient Name:  Kathleen Velazquez  Date of Exam:   11/26/2022 Medical Rec #: [id number]        Accession #:    [id number] Date of Birth: 1956/01/22        Patient Gender: F Patient Age:   67 years Exam Location:  [facility] Procedure:      VAS [place] UPPER EXTREMITY VENOUS DUPLEX Referring Phys: [name] --------------------------------------------------------------------------------  Indications: Right forearm focal area of erythema, swelling, and pain post-IV. Comparison Study: No prior studies. Performing Technologist: Jean Rosenthal RDMS, RVT  Examination Guidelines: A complete evaluation includes B-mode imaging, spectral Doppler, color Doppler, and power Doppler as needed of all accessible portions of each vessel. Bilateral testing is considered an integral part of a complete examination. Limited examinations for reoccurring indications may be performed as noted.  Right Findings: +----------+------------+---------+-----------+------------------------+-------+ RIGHT     CompressiblePhasicitySpontaneous       Properties       Summary +----------+------------+---------+-----------+------------------------+-------+ IJV           Full       Yes       Yes                                    +----------+------------+---------+-----------+------------------------+-------+ Subclavian               Yes       Yes                                    +----------+------------+---------+-----------+------------------------+-------+ Axillary  Full       Yes       Yes                                    +----------+------------+---------+-----------+------------------------+-------+ Brachial      Full                                                        +----------+------------+---------+-----------+------------------------+-------+ Radial        Full                                                        +----------+------------+---------+-----------+------------------------+-------+ Ulnar          Full                                                        +----------+------------+---------+-----------+------------------------+-------+ Cephalic      None       No        No       Mid forearm area of                                                             concern                 +----------+------------+---------+-----------+------------------------+-------+ Basilic       Full                                                        +----------+------------+---------+-----------+------------------------+-------+  Left Findings: +----------+------------+---------+-----------+----------+-------+ LEFT      CompressiblePhasicitySpontaneousPropertiesSummary +----------+------------+---------+-----------+----------+-------+ Subclavian               Yes       Yes                      +----------+------------+---------+-----------+----------+-------+  Summary:  Right: No evidence of deep vein thrombosis in the upper extremity. Findings consistent with acute superficial vein thrombosis involving the right cephalic vein.  Left: No evidence of thrombosis in the subclavian.  *See table(s) above for measurements and observations.  Diagnosing physician: Sherald Hess MD Electronically signed by Sherald Hess MD on 11/26/2022 at 5:04:22 PM.    Final    CT CHEST WO CONTRAST  Result Date: 11/25/2022 CLINICAL DATA:  Respiratory illness. EXAM: CT CHEST WITHOUT CONTRAST TECHNIQUE: Multidetector CT imaging of the chest was performed following the standard protocol without IV contrast. RADIATION DOSE REDUCTION: This exam was performed according to the departmental dose-optimization program which includes automated exposure control, adjustment of the mA  and/or kV according to patient size and/or use of iterative reconstruction technique. COMPARISON:  X-ray 11/23/2022 and older.  CT scan December 2017 FINDINGS: Cardiovascular: Small pericardial effusion. There is prominent  epicardial fat. The heart itself is nonenlarged. The thoracic aorta has a normal course and caliber on this non IV contrast exam with mild calcified plaque. Significant coronary artery calcifications are seen. Mediastinum/Nodes: Large hiatal hernia. Slightly patulous esophagus above the hernia. Small thyroid gland. No specific abnormal lymph node enlargement identified in the axillary regions, hilum or mediastinum. Lungs/Pleura: No consolidation, pneumothorax or effusion. Mild basilar scar or atelectasis. Small ill-defined nodular area right lower lobe measuring 4 mm. Semi-solid nodule. Similar focus just caudal to this measuring 6 mm on series 4 image 114 as well. Upper Abdomen: The adrenal glands are preserved in the upper abdomen. Previous cholecystectomy. Musculoskeletal: Healed right-sided rib fracture. Minimal degenerative changes along the spine IMPRESSION: Two adjacent small semi-solid lung nodules of the right costophrenic angle largest measuring 6 mm. Non-contrast chest CT at 3-6 months is recommended. If nodules persist, subsequent management will be based upon the most suspicious nodule(s). This recommendation follows the consensus statement: Guidelines for Management of Incidental Pulmonary Nodules Detected on CT Images: From the Fleischner Society 2017; Radiology 2017; 284:228-243. Large hiatal hernia with a slightly patulous esophagus. Significant coronary artery calcifications. Please correlate for other coronary risk factors Aortic Atherosclerosis (ICD10-I70.0). Electronically Signed   By: Karen Kays M.D.   On: 11/25/2022 14:09    Cardiac Studies   Echo 11/20/2022 1. Very difficult study due to poor echo windows and lack of  visualization of LV endocardium. However, based on limited views, there is  severe biventricular failure with estimated LVEF ~25% (endocardium very  poorly visualized) and severe RV dysfunction.   2. Left ventricular ejection fraction, by estimation, is 25 to 30%. The   left ventricle has severely decreased function. Left ventricular  endocardial border not optimally defined to evaluate regional wall motion.  There is mild left ventricular  hypertrophy. Left ventricular diastolic parameters are consistent with  Grade II diastolic dysfunction (pseudonormalization).   3. Right ventricular systolic function is severely reduced. The right  ventricular size is mildly enlarged.   4. Left atrial size was mildly dilated.   5. The mitral valve is degenerative. Mild to moderate mitral valve  regurgitation. Moderate mitral annular calcification.   6. The aortic valve is tricuspid. There is mild calcification of the  aortic valve. There is mild thickening of the aortic valve. Aortic valve  regurgitation is not visualized. Aortic valve sclerosis/calcification is  present, without any evidence of  aortic stenosis.   7. The inferior vena cava is dilated in size with <50% respiratory  variability, suggesting right atrial pressure of 15 mmHg.   8. Moderate pericardial effusion. There is no evidence of cardiac  tamponade.   Comparison(s): No prior Echocardiogram.      Cath 11/22/2022   Mid LAD lesion is 20% stenosed.   Mid Cx lesion is 20% stenosed.   Mild non-obstructive disease in the mid Circumflex and mid LAD Moderate caliber non-dominant RCA with no disease.  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 She has received IV Lasix this am. I will give another 40 mg of IV Lasix tonight. Medical management of mild CAD   Flowsheet Row Most Recent Value  Fick Cardiac Output 4.13 L/min  Fick Cardiac Output Index 2.58 (L/min)/BSA  RA A Wave 22 mmHg  RA V Wave 18 mmHg  RA Mean 16 mmHg  RV Systolic Pressure 48 mmHg  RV Diastolic Pressure 4 mmHg  RV EDP 23 mmHg  PA Systolic Pressure 47 mmHg  PA Diastolic Pressure 29  mmHg  PA Mean 35 mmHg  PW A Wave 19 mmHg  PW V Wave 27 mmHg  PW Mean 24 mmHg  AO Systolic Pressure 127 mmHg  AO Diastolic Pressure 84 mmHg  AO Mean 101 mmHg  LV Systolic Pressure 132 mmHg  LV Diastolic Pressure 5 mmHg  LV EDP 18 mmHg  AOp Systolic Pressure 125 mmHg  AOp Diastolic Pressure 79 mmHg  AOp Mean Pressure 98 mmHg  LVp Systolic Pressure 121 mmHg  LVp Diastolic Pressure 15 mmHg  LVp EDP Pressure 18 mmHg  QP/QS 1  TPVR Index 13.58 HRUI  TSVR Index 39.19 HRUI  PVR SVR Ratio 0.13  TPVR/TSVR Ratio 0.35    Patient Profile     67 y.o. female without prior cardiac history. Presented with R sided weakness. Echo with severe biventricular failure, cath without obstructive CAD.  Assessment & Plan    Biventricular heart failure Nonischemic cardiomyopathy PVCs, atrial tachycardia, NSVT -LVEF 25-30%, RV function also reduced -nonischemic based on cath -declines cardiac MRI -GDMT: BP too low for entresto. Started on losartan, spironolactone, empagliflozin, and metoprolol succinate this admission -changed lasix to PRN for weight gain/swelling more than 3 lbs overnight or 5 lbs from baseline. With SGLT2i and MRA, suspect she will not need lasix routinely -Cr bumped this AM, supports that she is euvolemic to dry  Nonobstructive CAD Hyperlipidemia, primarily elevated triglycerides -continue gemfibrozil for now, though with start of statin could potentially stop in the future. She has longstanding issues with diarrhea, this may improve with stopping gemfibrozil -LDL 48, HDL 34 -had rectal bleeding with heparin. Hold aspirin for now -started rosuvastatin this admission, recheck lipids/lfts in 3 mos  Elevated D-dimer: VQ negative R sided weakness: CT and MRI negative for acute intracranial pathology  [facility] will sign off.   Medication Recommendations:  continue meds as ordered, plus furosemide 40 mg daily PRN for weight gain Other recommendations (labs, testing,  etc):  BMET within a week Follow up as an outpatient:  She has follow up with the HF impact clinic on 12/14/22   For questions or updates, please contact [facility] Please consult [email or web address] for contact info under     Signed, Jodelle Red, MD  11/27/2022, 11:07 AM

## 2022-11-27 NOTE — Plan of Care (Signed)
  Problem: Education: Goal: Knowledge of General Education information will improve Description Including pain rating scale, medication(s)/side effects and non-pharmacologic comfort measures Outcome: Progressing   Problem: Health Behavior/Discharge Planning: Goal: Ability to manage health-related needs will improve Outcome: Progressing   

## 2022-11-28 NOTE — Discharge Summary (Signed)
Physician Discharge Summary   Patient: Kathleen Velazquez MRN: [id number] DOB: 09/16/1955  Admit date:     11/18/2022  Discharge date: 11/27/2022  Discharge Physician: [name]   PCP: Toma Deiters, MD   Recommendations at discharge:  Please follow up with PCP in one week.  Please follow up with cardiology as recommended.  Please check cbc and bmp in one week.  Recommend check repeat CT of the chest in 3 to 6 months.   Discharge Diagnoses: Principal Problem:   Right sided weakness Active Problems:   Sinus tachycardia   Hypokalemia   IBS (irritable bowel syndrome)   Hyperlipidemia   Acute combined systolic and diastolic heart failure   Right ventricular failure   Gastrointestinal hemorrhage associated with anorectal source   NICM (nonischemic cardiomyopathy)    Hospital Course:   Kathleen Velazquez is a 67 y.o. female with a hx of anxiety, GERD, HTN, IBS, and sinus tachycardia presents with right sided weakness.    Assessment and Plan:    Acute on chronic systolic and diastolic CHF with pericardial effusion:  - s/p cardiac cath, recommending medical management and GDMT for heart failure.  - she was started on IV lasix 40 mg BID transitioned to oral lasix 40 mg daily but discontinued to low BP, spironolactone 12.5 mg daily and cozaar. - continue with strict intake and output. Daily weights.  - echocardiogram showed there is  severe biventricular failure with estimated LVEF ~25%  and severe RV dysfunction.  FARXIGA 10 mg daily added to her regimen.        * Right sided weakness CT and MRI of the brain negative for acute pathology and stroke.  Neurology on board and suggested symptoms possibly from hypokalemia.  Therapy evaluations ordered.  But patient refused home health PT    Hyperlipidemia - Continue home gemfibrozil   IBS (irritable bowel syndrome) Stable.    Hypokalemia Replaced .    SVT: Increase metoprolol succinate to 50 mg daily.    Non  obstructive CAD;    Cough:  Intermittent, .  CXR is negative for acute cardiopulmonary disease.  CT chest with out contrast showed Two adjacent small semi-solid lung nodules of the right costophrenic angle largest measuring 6 mm. Non-contrast chest CT at 3-6 months is Recommended. Possibly her cough could be from hiatal hernia and reflux.  Will add PPI to her regimen and monitor.    Right forearm SVT with redness and tenderness Will add doxycycline for some cellulitis around the area due to IV infiltration.  Venous duplex of the upper extremity reviewed.    Estimated body mass index is 22.9 kg/m as calculated from the following:   Height as of this encounter: 5\' 2"  (1.575 m).   Weight as of this encounter: 56.8 kg.    Consultants: cardiology. Neurology.  Procedures performed: echocardiogram. Cath.   Disposition: Home Diet recommendation:  Discharge Diet Orders (From admission, onward)     Start     Ordered   11/27/22 0000  Diet - low sodium heart healthy        11/27/22 1232   11/26/22 0000  Diet - low sodium heart healthy        11/26/22 1516           Cardiac diet DISCHARGE MEDICATION: Allergies as of 11/27/2022       Reactions   Codeine Nausea And Vomiting        Medication List     STOP taking these  medications    ibuprofen 200 MG tablet Commonly known as: ADVIL   metoprolol tartrate 25 MG tablet Commonly known as: LOPRESSOR       TAKE these medications    allopurinol 100 MG tablet Commonly known as: ZYLOPRIM Take 100 mg by mouth daily.   benzonatate 100 MG capsule Commonly known as: TESSALON Take 1 capsule (100 mg total) by mouth 3 (three) times daily as needed for cough.   doxycycline 100 MG capsule Commonly known as: VIBRAMYCIN Take 1 capsule (100 mg total) by mouth 2 (two) times daily for 5 days.   empagliflozin 10 MG Tabs tablet Commonly known as: JARDIANCE Take 1 tablet (10 mg total) by mouth daily.   furosemide 20 MG  tablet Commonly known as: Lasix Take 1 tablet (20 mg total) by mouth daily as needed for edema (for weight gain >5lb in one week).   gemfibrozil 600 MG tablet Commonly known as: LOPID Take 600 mg by mouth 2 (two) times daily before a meal.   losartan 25 MG tablet Commonly known as: COZAAR Take 0.5 tablets (12.5 mg total) by mouth daily.   metoprolol succinate 50 MG 24 hr tablet Commonly known as: TOPROL-XL Take 1 tablet (50 mg total) by mouth daily. Take with or immediately following a meal.   pantoprazole 40 MG tablet Commonly known as: PROTONIX Take 1 tablet (40 mg total) by mouth daily at 6 (six) AM.   rosuvastatin 10 MG tablet Commonly known as: CRESTOR Take 1 tablet (10 mg total) by mouth daily.   spironolactone 25 MG tablet Commonly known as: ALDACTONE Take 0.5 tablets (12.5 mg total) by mouth daily.        Follow-up Information     [facility]. Go in 21 day(s).   Specialty: Cardiology Why: Hospital follow up 12/14/2022 @ 9 am PLEASE bring a current medication list to appointment FREE valet parking , Entrance [address], off [employer] information: [address] [id number] [place] [address] [phone]        Toma Deiters, MD. Schedule an appointment as soon as possible for a visit in 1 week(s).   Specialty: Internal Medicine Contact information: [address] [place] [address] [phone]                Discharge Exam: Filed Weights   11/25/22 0525 11/26/22 0651 11/27/22 0509  Weight: 55.9 kg 56.8 kg 59.3 kg   General exam: Appears calm and comfortable  Respiratory system: Clear to auscultation. Respiratory effort normal. Cardiovascular system: S1 & S2 heard, RRR. No JVD,  Gastrointestinal system: Abdomen is nondistended, soft and nontender.  Central nervous system: Alert and oriented. No focal neurological deficits. Extremities: Symmetric 5 x 5  power. Skin: No rashes, lesions or ulcers Psychiatry: Judgement and insight appear normal. Mood & affect appropriate.    Condition at discharge: fair  The results of significant diagnostics from this hospitalization (including imaging, microbiology, ancillary and laboratory) are listed below for reference.   Imaging Studies: VAS [place] UPPER EXTREMITY VENOUS DUPLEX  Result Date: 11/26/2022 UPPER VENOUS STUDY  Patient Name:  Kathleen Velazquez  Date of Exam:   11/26/2022 Medical Rec #: [id number]        Accession #:    [id number] Date of Birth: 06/03/1956        Patient Gender: F Patient Age:   12 years Exam Location:  [facility] Procedure:      VAS  [place] UPPER EXTREMITY VENOUS DUPLEX Referring Phys: [name] --------------------------------------------------------------------------------  Indications: Right forearm focal area of erythema, swelling, and pain post-IV. Comparison Study: No prior studies. Performing Technologist: Jean Rosenthal RDMS, RVT  Examination Guidelines: A complete evaluation includes B-mode imaging, spectral Doppler, color Doppler, and power Doppler as needed of all accessible portions of each vessel. Bilateral testing is considered an integral part of a complete examination. Limited examinations for reoccurring indications may be performed as noted.  Right Findings: +----------+------------+---------+-----------+------------------------+-------+ RIGHT     CompressiblePhasicitySpontaneous       Properties       Summary +----------+------------+---------+-----------+------------------------+-------+ IJV           Full       Yes       Yes                                    +----------+------------+---------+-----------+------------------------+-------+ Subclavian               Yes       Yes                                    +----------+------------+---------+-----------+------------------------+-------+ Axillary      Full       Yes       Yes                                     +----------+------------+---------+-----------+------------------------+-------+ Brachial      Full                                                        +----------+------------+---------+-----------+------------------------+-------+ Radial        Full                                                        +----------+------------+---------+-----------+------------------------+-------+ Ulnar         Full                                                        +----------+------------+---------+-----------+------------------------+-------+ Cephalic      None       No        No       Mid forearm area of                                                             concern                 +----------+------------+---------+-----------+------------------------+-------+ Basilic       Full                                                        +----------+------------+---------+-----------+------------------------+-------+  Left Findings: +----------+------------+---------+-----------+----------+-------+ LEFT      CompressiblePhasicitySpontaneousPropertiesSummary +----------+------------+---------+-----------+----------+-------+ Subclavian               Yes       Yes                      +----------+------------+---------+-----------+----------+-------+  Summary:  Right: No evidence of deep vein thrombosis in the upper extremity. Findings consistent with acute superficial vein thrombosis involving the right cephalic vein.  Left: No evidence of thrombosis in the subclavian.  *See table(s) above for measurements and observations.  Diagnosing physician: Sherald Hess MD Electronically signed by Sherald Hess MD on 11/26/2022 at 5:04:22 PM.    Final    CT CHEST WO CONTRAST  Result Date: 11/25/2022 CLINICAL DATA:  Respiratory illness. EXAM: CT CHEST WITHOUT CONTRAST TECHNIQUE: Multidetector CT imaging of the chest was performed following the  standard protocol without IV contrast. RADIATION DOSE REDUCTION: This exam was performed according to the departmental dose-optimization program which includes automated exposure control, adjustment of the mA and/or kV according to patient size and/or use of iterative reconstruction technique. COMPARISON:  X-ray 11/23/2022 and older.  CT scan December 2017 FINDINGS: Cardiovascular: Small pericardial effusion. There is prominent epicardial fat. The heart itself is nonenlarged. The thoracic aorta has a normal course and caliber on this non IV contrast exam with mild calcified plaque. Significant coronary artery calcifications are seen. Mediastinum/Nodes: Large hiatal hernia. Slightly patulous esophagus above the hernia. Small thyroid gland. No specific abnormal lymph node enlargement identified in the axillary regions, hilum or mediastinum. Lungs/Pleura: No consolidation, pneumothorax or effusion. Mild basilar scar or atelectasis. Small ill-defined nodular area right lower lobe measuring 4 mm. Semi-solid nodule. Similar focus just caudal to this measuring 6 mm on series 4 image 114 as well. Upper Abdomen: The adrenal glands are preserved in the upper abdomen. Previous cholecystectomy. Musculoskeletal: Healed right-sided rib fracture. Minimal degenerative changes along the spine IMPRESSION: Two adjacent small semi-solid lung nodules of the right costophrenic angle largest measuring 6 mm. Non-contrast chest CT at 3-6 months is recommended. If nodules persist, subsequent management will be based upon the most suspicious nodule(s). This recommendation follows the consensus statement: Guidelines for Management of Incidental Pulmonary Nodules Detected on CT Images: From the Fleischner Society 2017; Radiology 2017; 284:228-243. Large hiatal hernia with a slightly patulous esophagus. Significant coronary artery calcifications. Please correlate for other coronary risk factors Aortic Atherosclerosis (ICD10-I70.0).  Electronically Signed   By: Karen Kays M.D.   On: 11/25/2022 14:09   DG CHEST PORT 1 VIEW  Result Date: 11/23/2022 CLINICAL DATA:  Shortness of breath EXAM: PORTABLE CHEST 1 VIEW COMPARISON:  X-ray 11/21/2022 FINDINGS: Underinflation. Borderline cardiopericardial silhouette. No consolidation, pneumothorax or effusion. No edema. Overlapping cardiac leads. IMPRESSION: Underinflation. Borderline size heart. No acute cardiopulmonary disease Electronically Signed   By: Karen Kays M.D.   On: 11/23/2022 15:12   CARDIAC CATHETERIZATION  Result Date: 11/22/2022   Mid LAD lesion is 20% stenosed.   Mid Cx lesion is 20% stenosed. Mild non-obstructive disease in the mid Circumflex and mid LAD Moderate caliber non-dominant RCA with no disease. Prominent Thebesian vein network noted on angiography Elevated right and left heart pressures Fick Cardiac Output 4.1 L/min Cardiac index 2.58 Recommendations: GDMT for heart failure. She has received IV Lasix this am. I will give another 40 mg of IV Lasix tonight. Medical management of mild CAD.   NM Pulmonary Perfusion  Result Date: 11/21/2022 CLINICAL DATA:  Pulmonary embolism suspected.  High probability. Sinus tachycardia. Right-sided weakness. EXAM: NUCLEAR MEDICINE PERFUSION LUNG SCAN TECHNIQUE: Perfusion images were obtained in multiple projections after intravenous injection of radiopharmaceutical. Ventilation scans intentionally deferred if perfusion scan and chest x-ray adequate for interpretation during COVID 19 epidemic. RADIOPHARMACEUTICALS:  3.6 mCi Tc-22m MAA IV COMPARISON:  No prior V/Q scan available for comparison; correlation is made with chest radiographs 11/21/2022, 11/19/2022, 07/30/2016 FINDINGS: There is normal homogeneous distribution of radiotracer. No large segmental defect is seen. IMPRESSION: Normal perfusion scan (pulmonary embolism absent) by perfusion only modified PIOPED II criteria. Electronically Signed   By: Neita Garnet M.D.   On:  11/21/2022 16:08   DG CHEST PORT 1 VIEW  Result Date: 11/21/2022 CLINICAL DATA:  Weakness.  Foot swelling. EXAM: PORTABLE CHEST 1 VIEW COMPARISON:  Radiographs 11/19/2022 and 07/30/2016.  CT 07/29/2016. FINDINGS: 0950 hours. Stable cardiomegaly, vascular congestion and bilateral pleural effusions. There is associated dependent atelectasis at both lung bases. No pneumothorax. The bones appear unchanged. Telemetry leads overlie the chest. IMPRESSION: Cardiomegaly, vascular congestion and bilateral pleural effusions consistent with congestive heart failure. No significant change from previous study of 2 days prior. Electronically Signed   By: Carey Bullocks M.D.   On: 11/21/2022 10:16   ECHOCARDIOGRAM COMPLETE  Result Date: 11/20/2022    ECHOCARDIOGRAM REPORT   Patient Name:   Kathleen Velazquez Date of Exam: 11/20/2022 Medical Rec #:  [id number]       Height:       62.0 in Accession #:    [id number]      Weight:       132.3 lb Date of Birth:  08/03/1956       BSA:          1.604 m Patient Age:    67 years        BP:           88/63 mmHg Patient Gender: F               HR:           80 bpm. Exam Location:  Inpatient Procedure: 2D Echo, Cardiac Doppler and Color Doppler Indications:    Acute respiratory distress R06.03  History:        Patient has no prior history of Echocardiogram examinations.                 Arrythmias:Tachycardia; Risk Factors:Dyslipidemia.  Sonographer:    Lucendia Herrlich Referring Phys: [id number] AMRIT ADHIKARI IMPRESSIONS  1. Very difficult study due to poor echo windows and lack of visualization of LV endocardium. However, based on limited views, there is severe biventricular failure with estimated LVEF ~25% (endocardium very poorly visualized) and severe RV dysfunction.  2. Left ventricular ejection fraction, by estimation, is 25 to 30%. The left ventricle has severely decreased function. Left ventricular endocardial border not optimally defined to evaluate regional wall motion. There is  mild left ventricular hypertrophy. Left ventricular diastolic parameters are consistent with Grade II diastolic dysfunction (pseudonormalization).  3. Right ventricular systolic function is severely reduced. The right ventricular size is mildly enlarged.  4. Left atrial size was mildly dilated.  5. The mitral valve is degenerative. Mild to moderate mitral valve regurgitation. Moderate mitral annular calcification.  6. The aortic valve is tricuspid. There is mild calcification of the aortic valve. There is mild thickening of the aortic valve. Aortic valve regurgitation is not visualized. Aortic valve sclerosis/calcification is present, without any evidence of aortic stenosis.  7. The inferior vena  cava is dilated in size with <50% respiratory variability, suggesting right atrial pressure of 15 mmHg.  8. Moderate pericardial effusion. There is no evidence of cardiac tamponade. Comparison(s): No prior Echocardiogram. FINDINGS  Left Ventricle: Left ventricular ejection fraction, by estimation, is 25 to 30%. The left ventricle has severely decreased function. Left ventricular endocardial border not optimally defined to evaluate regional wall motion. The left ventricular internal cavity size was normal in size. There is mild left ventricular hypertrophy. Left ventricular diastolic parameters are consistent with Grade II diastolic dysfunction (pseudonormalization). Right Ventricle: The right ventricular size is mildly enlarged. Right vetricular wall thickness was not well visualized. Right ventricular systolic function is severely reduced. The tricuspid regurgitant velocity is 2.05 m/s, and with an assumed right atrial pressure of 15 mmHg, the estimated right ventricular systolic pressure is 31.8 mmHg. Left Atrium: Left atrial size was mildly dilated. Right Atrium: Right atrial size was normal in size. Pericardium: A moderately sized pericardial effusion is present. There is no evidence of cardiac tamponade. Mitral Valve:  The mitral valve is degenerative in appearance. Moderate mitral annular calcification. Mild to moderate mitral valve regurgitation. Tricuspid Valve: The tricuspid valve is normal in structure. Tricuspid valve regurgitation is mild. Aortic Valve: The aortic valve is tricuspid. There is mild calcification of the aortic valve. There is mild thickening of the aortic valve. Aortic valve regurgitation is not visualized. Aortic valve sclerosis/calcification is present, without any evidence of aortic stenosis. Aortic valve peak gradient measures 4.2 mmHg. Pulmonic Valve: The pulmonic valve was normal in structure. Pulmonic valve regurgitation is trivial. Aorta: The aortic root and ascending aorta are structurally normal, with no evidence of dilitation. Venous: The inferior vena cava is dilated in size with less than 50% respiratory variability, suggesting right atrial pressure of 15 mmHg. IAS/Shunts: The atrial septum is grossly normal.  LEFT VENTRICLE PLAX 2D LVIDd:         4.30 cm   Diastology LVIDs:         3.00 cm   LV e' lateral:   6.18 cm/s LV PW:         1.10 cm   LV E/e' lateral: 12.4 LV IVS:        1.20 cm LVOT diam:     2.00 cm LV SV:         32 LV SV Index:   20 LVOT Area:     3.14 cm  LEFT ATRIUM           Index        RIGHT ATRIUM           Index LA diam:      4.20 cm 2.62 cm/m   RA Area:     14.20 cm LA Vol (A4C): 60.9 ml 37.98 ml/m  RA Volume:   34.50 ml  21.51 ml/m  AORTIC VALVE AV Area (Vmax): 1.94 cm AV Vmax:        102.00 cm/s AV Peak Grad:   4.2 mmHg LVOT Vmax:      62.93 cm/s LVOT Vmean:     39.400 cm/s LVOT VTI:       0.103 m  AORTA Ao Root diam: 3.00 cm Ao Asc diam:  3.20 cm MITRAL VALVE               TRICUSPID VALVE MV Area (PHT): 6.96 cm    TR Peak grad:   16.8 mmHg MV Decel Time: 109 msec    TR Vmax:  205.00 cm/s MV E velocity: 76.70 cm/s MV A velocity: 40.70 cm/s  SHUNTS MV E/A ratio:  1.88        Systemic VTI:  0.10 m                            Systemic Diam: 2.00 cm Laurance Flatten MD Electronically signed by Laurance Flatten MD Signature Date/Time: 11/20/2022/3:28:17 PM    Final    DG CHEST PORT 1 VIEW  Result Date: 11/19/2022 CLINICAL DATA:  Shortness of breath. EXAM: PORTABLE CHEST 1 VIEW COMPARISON:  July 30, 2016. FINDINGS: Mild cardiomegaly is noted with central pulmonary vascular congestion. Probable mild bilateral pulmonary edema is noted. Small bilateral pleural effusions are noted. Bony thorax is unremarkable. IMPRESSION: Mild cardiomegaly with central pulmonary vascular congestion and probable bilateral pulmonary edema. Electronically Signed   By: Lupita Raider M.D.   On: 11/19/2022 14:20   MR BRAIN W WO CONTRAST  Result Date: 11/19/2022 CLINICAL DATA:  Stroke suspected EXAM: MRI HEAD WITHOUT AND WITH CONTRAST TECHNIQUE: Multiplanar, multiecho pulse sequences of the brain and surrounding structures were obtained without and with intravenous contrast. CONTRAST:  6mL GADAVIST GADOBUTROL 1 MMOL/ML IV SOLN COMPARISON:  CT head 11/18/2022 FINDINGS: Brain: Negative for an acute infarct. No hemorrhage. No hydrocephalus. No extra-axial fluid collection. Mild for age chronic microvascular ischemic change. Age-appropriate volume loss. No mass effect. No mass lesion. Vascular: Normal flow voids. Skull and upper cervical spine: Normal marrow signal. Sinuses/Orbits: Trace bilateral mastoid effusions. No middle ear effusion. Paranasal sinuses are notable for mucosal thickening in the bilateral sphenoid sinuses. Bilateral lens replacement. Orbits are otherwise unremarkable. Other: None. IMPRESSION: No acute intracranial process. Electronically Signed   By: Lorenza Cambridge M.D.   On: 11/19/2022 07:02   CT Head Wo Contrast  Result Date: 11/18/2022 CLINICAL DATA:  CVA, right arm weakness EXAM: CT HEAD WITHOUT CONTRAST TECHNIQUE: Contiguous axial images were obtained from the base of the skull through the vertex without intravenous contrast. RADIATION DOSE REDUCTION: This  exam was performed according to the departmental dose-optimization program which includes automated exposure control, adjustment of the mA and/or kV according to patient size and/or use of iterative reconstruction technique. COMPARISON:  07/27/2016 FINDINGS: Brain: No acute intracranial findings are seen. There are no signs of bleeding within the cranium. Cortical sulci are prominent. Vascular: Scattered arterial calcifications are seen. Skull: No acute findings are seen. Sinuses/Orbits: There is mucosal thickening in sphenoid sinus. There are no air-fluid levels in the sinuses. Other: None IMPRESSION: No acute intracranial findings are seen in noncontrast CT brain. Atrophy. Chronic sphenoid sinusitis. Electronically Signed   By: Ernie Avena M.D.   On: 11/18/2022 20:55    Microbiology: Results for orders placed or performed during the hospital encounter of 02/26/20  Aerobic/Anaerobic Culture (surgical/deep wound)     Status: None   Collection Time: 02/26/20  1:54 PM   Specimen: Synovial, Right Knee; Body Fluid  Result Value Ref Range Status   Specimen Description   Final    KNEE RIGHT Performed at [facility], [address] [place], [place] [address]    Special Requests   Final    NONE Performed at [facility], [address] [place], [place] [address]    Gram Stain   Final    NO WBC SEEN NO ORGANISMS SEEN Gram Stain Report Called to,Read Back By and Verified With: J.LOPEZ RN AT 1440 ON 02/26/20  BY N.THOMPSON Performed at [facility], [address] [place], [place] [address]    Culture   Final    No growth aerobically or anaerobically. Performed at [facility], [address] [place], [place] [address]    Report Status 03/02/2020 FINAL  Final    Labs: CBC: Recent Labs  Lab 11/23/22 0215 11/24/22 0228 11/25/22 0235 11/26/22 0234 11/27/22 0459  WBC 4.9 6.5 5.7 6.0 6.9  NEUTROABS 3.3 4.0 3.6 3.0 3.8   HGB 12.9 13.7 14.4 12.6 13.1  HCT 38.6 40.4 41.3 36.9 37.8  MCV 104.9* 102.0* 100.2* 100.8* 99.7  PLT 148* 155 120* 153 168   Basic Metabolic Panel: Recent Labs  Lab 11/23/22 0215 11/24/22 0228 11/25/22 0235 11/26/22 0234 11/27/22 0459  NA 139 135 135 130* 135  K 4.7 3.8 3.4* 3.6 3.6  CL 103 91* 90* 93* 96*  CO2 25 28 30 26 24   GLUCOSE 93 103* 105* 112* 106*  BUN 16 25* 27* 36* 37*  CREATININE 1.20* 1.33* 1.38* 1.29* 1.59*  CALCIUM 8.6* 8.7* 8.5* 8.7* 9.2  MG 1.8 1.5* 1.9 1.9 1.8  PHOS 4.7* 6.4* 5.2* 4.5 4.5   Liver Function Tests: Recent Labs  Lab 11/22/22 0249 11/23/22 0215  AST 63* 74*  ALT 77* 87*  ALKPHOS 92 105  BILITOT 1.3* 1.5*  PROT 5.6* 6.1*  ALBUMIN 2.9* 3.1*   CBG: No results for input(s): "GLUCAP" in the last 168 hours.  Discharge time spent: 42 minutes.   Signed: [name], MD [facility] 11/28/2022

## 2022-12-13 ENCOUNTER — Telehealth (HOSPITAL_COMMUNITY): Payer: Self-pay

## 2022-12-13 NOTE — Telephone Encounter (Signed)
Called to confirm/remind patient of their appointment at the [facility] on 12/13/22.   Patient reminded to bring all medications and/or complete list.  Confirmed patient has transportation. Gave directions, instructed to utilize valet parking.  Confirmed appointment prior to ending call.

## 2022-12-14 ENCOUNTER — Encounter (HOSPITAL_COMMUNITY): Payer: Self-pay

## 2022-12-14 ENCOUNTER — Ambulatory Visit (HOSPITAL_COMMUNITY)
Admit: 2022-12-14 | Discharge: 2022-12-14 | Disposition: A | Discharge status: Home / Self Care | Payer: Medicare Other | Attending: Cardiology | Admitting: Cardiology

## 2022-12-14 VITALS — BP 110/72 | HR 95 | Wt 140.0 lb

## 2022-12-14 DIAGNOSIS — I130 Hypertensive heart and chronic kidney disease with heart failure and stage 1 through stage 4 chronic kidney disease, or unspecified chronic kidney disease: Secondary | ICD-10-CM | POA: Diagnosis not present

## 2022-12-14 DIAGNOSIS — K589 Irritable bowel syndrome without diarrhea: Secondary | ICD-10-CM | POA: Insufficient documentation

## 2022-12-14 DIAGNOSIS — I3139 Other pericardial effusion (noninflammatory): Secondary | ICD-10-CM | POA: Diagnosis not present

## 2022-12-14 DIAGNOSIS — M25561 Pain in right knee: Secondary | ICD-10-CM | POA: Diagnosis not present

## 2022-12-14 DIAGNOSIS — R0609 Other forms of dyspnea: Secondary | ICD-10-CM | POA: Insufficient documentation

## 2022-12-14 DIAGNOSIS — I080 Rheumatic disorders of both mitral and aortic valves: Secondary | ICD-10-CM | POA: Diagnosis not present

## 2022-12-14 DIAGNOSIS — I428 Other cardiomyopathies: Secondary | ICD-10-CM | POA: Insufficient documentation

## 2022-12-14 DIAGNOSIS — Z7984 Long term (current) use of oral hypoglycemic drugs: Secondary | ICD-10-CM | POA: Diagnosis not present

## 2022-12-14 DIAGNOSIS — I491 Atrial premature depolarization: Secondary | ICD-10-CM | POA: Insufficient documentation

## 2022-12-14 DIAGNOSIS — I472 Ventricular tachycardia, unspecified: Secondary | ICD-10-CM | POA: Insufficient documentation

## 2022-12-14 DIAGNOSIS — Z79899 Other long term (current) drug therapy: Secondary | ICD-10-CM | POA: Insufficient documentation

## 2022-12-14 DIAGNOSIS — I493 Ventricular premature depolarization: Secondary | ICD-10-CM | POA: Insufficient documentation

## 2022-12-14 DIAGNOSIS — I5022 Chronic systolic (congestive) heart failure: Secondary | ICD-10-CM | POA: Diagnosis not present

## 2022-12-14 DIAGNOSIS — I2510 Atherosclerotic heart disease of native coronary artery without angina pectoris: Secondary | ICD-10-CM | POA: Diagnosis not present

## 2022-12-14 DIAGNOSIS — F419 Anxiety disorder, unspecified: Secondary | ICD-10-CM | POA: Diagnosis not present

## 2022-12-14 DIAGNOSIS — Z5986 Financial insecurity: Secondary | ICD-10-CM | POA: Insufficient documentation

## 2022-12-14 DIAGNOSIS — I5082 Biventricular heart failure: Secondary | ICD-10-CM | POA: Diagnosis not present

## 2022-12-14 DIAGNOSIS — K219 Gastro-esophageal reflux disease without esophagitis: Secondary | ICD-10-CM | POA: Insufficient documentation

## 2022-12-14 DIAGNOSIS — N1832 Chronic kidney disease, stage 3b: Secondary | ICD-10-CM | POA: Diagnosis not present

## 2022-12-14 DIAGNOSIS — E1122 Type 2 diabetes mellitus with diabetic chronic kidney disease: Secondary | ICD-10-CM | POA: Diagnosis not present

## 2022-12-14 LAB — CBC
HCT: 35.8 % — ABNORMAL LOW (ref 36.0–46.0)
Hemoglobin: 11.8 g/dL — ABNORMAL LOW (ref 12.0–15.0)
MCH: 33.3 pg (ref 26.0–34.0)
MCHC: 33 g/dL (ref 30.0–36.0)
MCV: 101.1 fL — ABNORMAL HIGH (ref 80.0–100.0)
Platelets: 186 10*3/uL (ref 150–400)
RBC: 3.54 MIL/uL — ABNORMAL LOW (ref 3.87–5.11)
RDW: 12.3 % (ref 11.5–15.5)
WBC: 8.9 10*3/uL (ref 4.0–10.5)
nRBC: 0 % (ref 0.0–0.2)

## 2022-12-14 LAB — BASIC METABOLIC PANEL
Anion gap: 11 (ref 5–15)
BUN: 12 mg/dL (ref 8–23)
CO2: 25 mmol/L (ref 22–32)
Calcium: 9.4 mg/dL (ref 8.9–10.3)
Chloride: 102 mmol/L (ref 98–111)
Creatinine, Ser: 0.7 mg/dL (ref 0.44–1.00)
GFR, Estimated: >60 mL/min (ref >60)
Glucose, Bld: 154 mg/dL — ABNORMAL HIGH (ref 70–99)
Potassium: 3.4 mmol/L — ABNORMAL LOW (ref 3.5–5.1)
Sodium: 138 mmol/L (ref 135–145)

## 2022-12-14 LAB — BRAIN NATRIURETIC PEPTIDE: B Natriuretic Peptide: 165.5 pg/mL — ABNORMAL HIGH (ref 0.0–100.0)

## 2022-12-14 MED ORDER — DIAZEPAM 5 MG PO TABS: 5.0000 mg | ORAL_TABLET | Freq: Once | ORAL | 0 refills | Status: DC

## 2022-12-14 MED ORDER — FUROSEMIDE 20 MG PO TABS: 20.0000 mg | ORAL_TABLET | ORAL | 11 refills | Status: DC

## 2022-12-14 MED ORDER — SPIRONOLACTONE 25 MG PO TABS: 25.0000 mg | ORAL_TABLET | Freq: Every day | ORAL | 11 refills | Status: DC

## 2022-12-14 MED ORDER — DIAZEPAM 5 MG PO TABS: ORAL_TABLET | ORAL | 0 refills | Status: DC

## 2022-12-14 NOTE — Progress Notes (Signed)
HEART & VASCULAR TRANSITION OF CARE CONSULT NOTE     Referring Physician: Dr. Cristal Deer  Primary Care: Toma Deiters, MD Primary Cardiologist: Meriam Sprague, MD   HPI: Referred to clinic by Dr. Cristal Deer, Cardiology, for heart failure consultation.   67 y.o. female with a hx of anxiety, GERD, HTN, IBS, and persistent sinus tachycardia which had been followed by cardiology.   Echo 11/2011 EF normal 65-70%, mild LVH, G1DD, mild MR. Normal RV Echo 07/2016 EF 55-60%, trivial MR, RV mildly reduced, trivial pericardial effusion, left pleural effusion   She recently presented to the [facility] on 11/18/22 with right sided weakness and dyspnea. Stroke w/u was negative but she was found to be in acute CHF. BNP 1,370. D-dimer also elevated. Chest CT and V/Q scans both negative for PE. EKG showed SR 98 bpm w/ frequent PACs and PVCs. Also noted to have runs of NSVT on tele. 2D echo showed severe biventricular HF. LVEF 25-30%, RV severely reduced, GIIDD, mild-mod MR, mild AoV thickening and mod pericardial effusion. R/LHC c/w NICM, angiography w/ mild nonobstructive CAD (20% mLAD and 20% mLCx lesions). Prominent Thebesian vein network also noted. RHC hemodynamics showed elevated right and left sided filling pressures, mRA pressure 16 mmHg, PAP 47/29, mPCWP 24, FICK CO 4.13, CI 2.58, PAPi 1.1. She apparently refused cMRI. She was diuresed w/ IV Lasix and transitioned to PO. Placed on low dose GDMT. This was limited by soft BP. Discharged home on 4/21. D/c wt 130 lb. Referred to [facility] clinic.   She presents today for f/u.  Here w/ 1 of her daughter's. Her other daughter is on the phone. She is in motorized WC. Has been using this to get around outside of the house. Uses walker ambulating around the home. Limited by both exertional dyspnea, weakness and rt knee pain. NYHA Class III. + orthopnea. No LEE. Has been eating more since returning home. Wt up 10 lb at 140 lb. BP 110/72. Denies orthostatic  symptoms.   She is staying w/ her daughters. Refuses home health PT.    Cardiac Testing   Echo 11/20/2022 1. Very difficult study due to poor echo windows and lack of  visualization of LV endocardium. However, based on limited views, there is  severe biventricular failure with estimated LVEF ~25% (endocardium very  poorly visualized) and severe RV dysfunction.   2. Left ventricular ejection fraction, by estimation, is 25 to 30%. The  left ventricle has severely decreased function. Left ventricular  endocardial border not optimally defined to evaluate regional wall motion.  There is mild left ventricular  hypertrophy. Left ventricular diastolic parameters are consistent with  Grade II diastolic dysfunction (pseudonormalization).   3. Right ventricular systolic function is severely reduced. The right  ventricular size is mildly enlarged.   4. Left atrial size was mildly dilated.   5. The mitral valve is degenerative. Mild to moderate mitral valve  regurgitation. Moderate mitral annular calcification.   6. The aortic valve is tricuspid. There is mild calcification of the  aortic valve. There is mild thickening of the aortic valve. Aortic valve  regurgitation is not visualized. Aortic valve sclerosis/calcification is  present, without any evidence of  aortic stenosis.   7. The inferior vena cava is dilated in size with <50% respiratory  variability, suggesting right atrial pressure of 15 mmHg.   8. Moderate pericardial effusion. There is no evidence of cardiac  tamponade.   Comparison(s): No prior Echocardiogram.      Cath 11/22/2022  Mid LAD lesion is 20% stenosed.   Mid Cx lesion is 20% stenosed.   Mild non-obstructive disease in the mid Circumflex and mid LAD Moderate caliber non-dominant RCA with no disease.  Prominent Thebesian vein network noted on angiography Elevated right and left heart pressures Fick Cardiac Output 4.1 L/min Cardiac index 2.58    Recommendations:  GDMT for heart failure. She has received IV Lasix this am. I will give another 40 mg of IV Lasix tonight. Medical management of mild CAD   Flowsheet Row Most Recent Value  Fick Cardiac Output 4.13 L/min  Fick Cardiac Output Index 2.58 (L/min)/BSA  RA A Wave 22 mmHg  RA V Wave 18 mmHg  RA Mean 16 mmHg  RV Systolic Pressure 48 mmHg  RV Diastolic Pressure 4 mmHg  RV EDP 23 mmHg  PA Systolic Pressure 47 mmHg  PA Diastolic Pressure 29 mmHg  PA Mean 35 mmHg  PW A Wave 19 mmHg  PW V Wave 27 mmHg  PW Mean 24 mmHg  AO Systolic Pressure 127 mmHg  AO Diastolic Pressure 84 mmHg  AO Mean 101 mmHg  LV Systolic Pressure 132 mmHg  LV Diastolic Pressure 5 mmHg  LV EDP 18 mmHg  AOp Systolic Pressure 125 mmHg  AOp Diastolic Pressure 79 mmHg  AOp Mean Pressure 98 mmHg  LVp Systolic Pressure 121 mmHg  LVp Diastolic Pressure 15 mmHg  LVp EDP Pressure 18 mmHg  QP/QS 1  TPVR Index 13.58 HRUI  TSVR Index 39.19 HRUI  PVR SVR Ratio 0.13  TPVR/TSVR Ratio 0.35     Review of Systems: [y] = yes, [ ]  = no   General: Weight gain [Y ]; Weight loss [ ] ; Anorexia [ ] ; Fatigue [ ] ; Fever [ ] ; Chills [ ] ; Weakness [ ]   Cardiac: Chest pain/pressure [ ] ; Resting SOB [ ] ; Exertional SOB [Y ]; Orthopnea [ Y]; Pedal Edema [ ] ; Palpitations [ ] ; Syncope [ ] ; Presyncope [ ] ; Paroxysmal nocturnal dyspnea[ ]   Pulmonary: Cough [ ] ; Wheezing[ ] ; Hemoptysis[ ] ; Sputum [ ] ; Snoring [ ]   GI: Vomiting[ ] ; Dysphagia[ ] ; Melena[ ] ; Hematochezia [ ] ; Heartburn[ ] ; Abdominal pain [ ] ; Constipation [ ] ; Diarrhea [ ] ; BRBPR [ ]   GU: Hematuria[ ] ; Dysuria [ ] ; Nocturia[ ]   Vascular: Pain in legs with walking [ ] ; Pain in feet with lying flat [ ] ; Non-healing sores [ ] ; Stroke [ ] ; TIA [ ] ; Slurred speech [ ] ;  Neuro: Headaches[ ] ; Vertigo[ ] ; Seizures[ ] ; Paresthesias[ ] ;Blurred vision [ ] ; Diplopia [ ] ; Vision changes [ ]   Ortho/Skin: Arthritis [ Y]; Joint pain [ Y]; Muscle pain [ ] ; Joint swelling [ ] ; Back Pain [ ] ; Rash [ ]    Psych: Depression[ ] ; Anxiety[ ]   Heme: Bleeding problems [ ] ; Clotting disorders [ ] ; Anemia [ ]   Endocrine: Diabetes [ ] ; Thyroid dysfunction[ ]    Past Medical History:  Diagnosis Date   Anxiety    Arthritis    knees,    Ejection fraction    Normal, echo, June, 2013   GERD (gastroesophageal reflux disease)    Headache(784.0)    hx of migraines    History of hiatal hernia    Hypertension    Irritable bowel syndrome (IBS)    PONV (postoperative nausea and vomiting)    Sinus tachycardia    Persistent sinus tachycardia, June, 2013 ( prior monitor in 2007 had shown no significant abnormalities.)    Current Outpatient Medications  Medication Sig Dispense Refill  benzonatate (TESSALON) 100 MG capsule Take 1 capsule (100 mg total) by mouth 3 (three) times daily as needed for cough. 20 capsule 0   empagliflozin (JARDIANCE) 10 MG TABS tablet Take 1 tablet (10 mg total) by mouth daily. 30 tablet 2   furosemide (LASIX) 20 MG tablet Take 1 tablet (20 mg total) by mouth daily as needed for edema (for weight gain >5lb in one week). 30 tablet 1   losartan (COZAAR) 25 MG tablet Take 0.5 tablets (12.5 mg total) by mouth daily. 30 tablet 1   metoprolol succinate (TOPROL-XL) 50 MG 24 hr tablet Take 1 tablet (50 mg total) by mouth daily. Take with or immediately following a meal. 30 tablet 2   pantoprazole (PROTONIX) 40 MG tablet Take 1 tablet (40 mg total) by mouth daily at 6 (six) AM. 30 tablet 1   rosuvastatin (CRESTOR) 10 MG tablet Take 1 tablet (10 mg total) by mouth daily. 30 tablet 2   spironolactone (ALDACTONE) 25 MG tablet Take 0.5 tablets (12.5 mg total) by mouth daily. 30 tablet 2   traZODone (DESYREL) 50 MG tablet Take 50 mg by mouth at bedtime.     No current facility-administered medications for this encounter.    Allergies  Allergen Reactions   Codeine Nausea And Vomiting      Social History   Socioeconomic History   Marital status: Widowed    Spouse name: Not on file    Number of children: 1   Years of education: Not on file   Highest education level: Not on file  Occupational History   Occupation: Retired  Tobacco Use   Smoking status: Never   Smokeless tobacco: Never  Vaping Use   Vaping Use: Never used  Substance and Sexual Activity   Alcohol use: Not Currently    Comment: recovering alcoholic x 7 years    Drug use: No   Sexual activity: Not Currently    Birth control/protection: Post-menopausal  Other Topics Concern   Not on file  Social History Narrative   Not on file   Social Determinants of Health   Financial Resource Strain: Low Risk  (11/23/2022)   Overall Financial Resource Strain (CARDIA)    Difficulty of Paying Living Expenses: Not very hard  Food Insecurity: No Food Insecurity (11/19/2022)   Hunger Vital Sign    Worried About Running Out of Food in the Last Year: Never true    Ran Out of Food in the Last Year: Never true  Transportation Needs: No Transportation Needs (11/23/2022)   PRAPARE - Administrator, Civil Service (Medical): No    Lack of Transportation (Non-Medical): No  Physical Activity: Not on file  Stress: Not on file  Social Connections: Not on file  Intimate Partner Violence: Not At Risk (11/19/2022)   Humiliation, Afraid, Rape, and Kick questionnaire    Fear of Current or Ex-Partner: No    Emotionally Abused: No    Physically Abused: No    Sexually Abused: No      Family History  Problem Relation Age of Onset   Irritable bowel syndrome Sister     Vitals:   12/14/22 0903  BP: 110/72  Pulse: 95  SpO2: 99%  Weight: 63.5 kg (140 lb)    PHYSICAL EXAM: General:  elderly F, in WC No respiratory difficulty HEENT: normal Neck: supple. JVD 8 cm. Carotids 2+ bilat; no bruits. No lymphadenopathy or thryomegaly appreciated. Cor: PMI nondisplaced. Regular rate & rhythm. No rubs, gallops or murmurs.  Lungs: decreased BS at the bases bilaterally, no wheezing, rhonchi or rales  Abdomen: soft,  nontender, nondistended. No hepatosplenomegaly. No bruits or masses. Good bowel sounds. Extremities: no cyanosis, clubbing, rash, edema Neuro: alert & oriented x 3, cranial nerves grossly intact. moves all 4 extremities w/o difficulty. Affect pleasant.  ECG: sinus tach w/ PACs 102 bpm    ASSESSMENT & PLAN:  1. Chronic Systolic Heart Failure, Severe Biventricular Failure - Newly diagnosed. Echo 4/21 EF 25-30%, RV severely reduced - NICM. LHC mild nonobstructive CAD - RHC w/ elevated R+ L filling pressures, preserved CO (mRA 16, PAP 47/29, mPCWP 24, CO 4.13, CI 2.58, PAPi 1.1)  - NYHA Class III. Mild volume overload on exam, wt up since d/c - Continue Jardiance 10 mg daily  - Continue Losartan 12.5 mg daily. BP too soft for Entresto  - Increase Spironolactone to 25 mg daily  - Increase Lasix to 20 mg twice weekly  - Start digoxin 0.125 mg daily  - Plan cMRI. She is concerned regarding claustrophobia.  Will premedicate w/ Valium x 1 - [facility]. Continue GDMT titration q2 wks until fully optimized - Plan repeat 2D echo in 3 months. Assign to Dr. Gala Romney   2. Pericardial Effusion  - moderate by echo 4/21, no tamponade physiology  - likely 2/2 high RA pressures  - hemodynamics stable - continue diuretics per above  3. CAD - nonobstructive by cath 4/23 - mLAD 20% stenosis  - mLCx 20% stenosis  - on ? blocker + statin (LDL at goal, 48 mg/DL)  - not on ASA given issues w/ rectal bleeding   4. CKD IIIb - b/l Scr ~1.3 - on SGLT2i  - check BMP     Referred to HFSW (PCP, Medications, Transportation, ETOH Abuse, Drug Abuse, Insurance, Financial ): No  Refer to Pharmacy:  No Refer to Home Health: No  Refer to [facility]: Yes (Dr. Gala Romney)  Refer to General Cardiology:  No  Follow up  in the [facility] in 2 wks for further med titration. Can see APP.

## 2022-12-14 NOTE — Patient Instructions (Addendum)
Labs done today. We will contact you only if your labs are abnormal.  INCREASE Spironolactone to 25mg  (1 tablet) by mouth daily.   INCREASE Lasix to 20mg  (1 tablet) by mouth Monday, Wednesday and Friday.   TAKE VALIUM 1 HOUR PRIOR TO MRI  No other medication changes were made. Please continue all current medications as prescribed.  Your physician has requested that you have a cardiac MRI. Cardiac MRI uses a computer to create images of your heart as its beating, producing both still and moving pictures of your heart and major blood vessels. This has to be approved through your insurance company prior to scheduling, once approved we will contact you to schedule an appointment.   Your physician recommends that you schedule a follow-up appointment in: 2 weeks  If you have any questions or concerns before your next appointment please send [place] a message through [place] or call our office at [phone].    TO LEAVE A MESSAGE FOR THE NURSE SELECT OPTION 2, PLEASE LEAVE A MESSAGE INCLUDING: YOUR NAME DATE OF BIRTH CALL BACK NUMBER REASON FOR CALL**this is important as we prioritize the call backs  YOU WILL RECEIVE A CALL BACK THE SAME DAY AS LONG AS YOU CALL BEFORE 4:00 PM   Do the following things EVERYDAY: Weigh yourself in the morning before breakfast. Write it down and keep it in a log. Take your medicines as prescribed Eat low salt foods--Limit salt (sodium) to 2000 mg per day.  Stay as active as you can everyday Limit all fluids for the day to less than 2 liters   At the [facility], you and your health needs are our priority. As part of our continuing mission to provide you with exceptional heart care, we have created designated Provider Care Teams. These Care Teams include your primary Cardiologist (physician) and Advanced Practice Providers (APPs- Physician Assistants and Nurse Practitioners) who all work together to provide you with the care you need, when you  need it.   You may see any of the following providers on your designated Care Team at your next follow up: Dr Arvilla Meres Dr Marca Ancona Dr. Marcos Eke, NP Robbie Lis, [place] [facility] [place], [place] Brynda Peon, NP Karle Plumber, PharmD   Please be sure to bring in all your medications bottles to every appointment.    Thank you for choosing [facility]

## 2022-12-15 ENCOUNTER — Other Ambulatory Visit (HOSPITAL_COMMUNITY): Payer: Self-pay

## 2022-12-15 ENCOUNTER — Telehealth (HOSPITAL_COMMUNITY): Payer: Self-pay

## 2022-12-15 DIAGNOSIS — I5022 Chronic systolic (congestive) heart failure: Secondary | ICD-10-CM

## 2022-12-15 MED ORDER — DIAZEPAM 5 MG PO TABS: ORAL_TABLET | ORAL | 0 refills | Status: DC

## 2022-12-15 MED ORDER — SPIRONOLACTONE 25 MG PO TABS: 25.0000 mg | ORAL_TABLET | Freq: Every day | ORAL | 11 refills | Status: DC

## 2022-12-15 MED ORDER — DIGOXIN 125 MCG PO TABS: 0.1250 mg | ORAL_TABLET | Freq: Every day | ORAL | 3 refills | Status: DC

## 2022-12-15 MED ORDER — FUROSEMIDE 20 MG PO TABS: 20.0000 mg | ORAL_TABLET | ORAL | 11 refills | Status: DC

## 2022-12-15 NOTE — Addendum Note (Signed)
Encounter addended by: Allayne Butcher, PA-C on: 12/15/2022 10:15 AM  Actions taken: Order Reconciliation Section accessed, Actions taken from a BestPractice Advisory, Order list changed

## 2022-12-15 NOTE — Telephone Encounter (Signed)
-----   Message from Allayne Butcher, [place] sent at 12/14/2022  3:08 PM EDT ----- Call pt and daughter to relay results. Let them know that renal fx has improved. Fluid marker also much improved. K low.   Increase Spironolactone to 25 mg daily (this will help keep K up) Recommend Lasix 20 mg 2 days a week  Add Digoxin 0.125 mg daily   Needs f/u BMP, BNP and Dig level in 1 wk

## 2022-12-15 NOTE — Telephone Encounter (Signed)
I spoke to daughter and updated on lab results and medication changes. Follow up labs ordered and scheduled.

## 2022-12-23 ENCOUNTER — Ambulatory Visit (HOSPITAL_COMMUNITY)
Admission: RE | Admit: 2022-12-23 | Discharge: 2022-12-23 | Disposition: A | Discharge status: Home / Self Care | Payer: Medicare Other | Source: Ambulatory Visit | Attending: Cardiology | Admitting: Cardiology

## 2022-12-23 DIAGNOSIS — I5022 Chronic systolic (congestive) heart failure: Secondary | ICD-10-CM | POA: Insufficient documentation

## 2022-12-23 LAB — DIGOXIN LEVEL: Digoxin Level: <0.2 ng/mL — ABNORMAL LOW (ref 0.8–2.0)

## 2022-12-23 LAB — BASIC METABOLIC PANEL
Anion gap: 11 (ref 5–15)
BUN: 21 mg/dL (ref 8–23)
CO2: 24 mmol/L (ref 22–32)
Calcium: 9.5 mg/dL (ref 8.9–10.3)
Chloride: 104 mmol/L (ref 98–111)
Creatinine, Ser: 0.76 mg/dL (ref 0.44–1.00)
GFR, Estimated: >60 mL/min (ref >60)
Glucose, Bld: 98 mg/dL (ref 70–99)
Potassium: 4.6 mmol/L (ref 3.5–5.1)
Sodium: 139 mmol/L (ref 135–145)

## 2022-12-23 LAB — BRAIN NATRIURETIC PEPTIDE: B Natriuretic Peptide: 76.8 pg/mL (ref 0.0–100.0)

## 2022-12-28 NOTE — Progress Notes (Signed)
ADVANCED HF CLINIC NOTE    Referring Physician: Dr. Cristal Deer  Primary Care: Toma Deiters, MD Primary Cardiologist: Meriam Sprague, MD HF Cardiologist: Dr. Gala Romney  HPI: 67 y.o.  female with a hx of anxiety, GERD, HTN, IBS, and persistent sinus tachycardia which had been followed by cardiology.   Echo 11/2011 EF normal 65-70%, mild LVH, G1DD, mild MR. Normal RV Echo 07/2016 EF 55-60%, trivial MR, RV mildly reduced, trivial pericardial effusion, left pleural effusion   Presented to [facility] 4/24 with right sided weakness and dyspnea. Stroke w/u was negative but she was found to be in acute CHF. BNP 1,370. D-dimer also elevated. Chest CT and V/Q scans both negative for PE. EKG showed SR 98 bpm w/ frequent PACs and PVCs. Also noted to have runs of NSVT on tele. 2D echo showed severe biventricular HF. LVEF 25-30%, RV severely reduced, GIIDD, mild-mod MR, mild AoV thickening and mod pericardial effusion. R/LHC c/w NICM, angiography w/ mild nonobstructive CAD (20% mLAD and 20% mLCx lesions). Prominent Thebesian vein network also noted. RHC hemodynamics showed elevated right and left sided filling pressures, mRA pressure 16 mmHg, PAP 47/29, mPCWP 24, FICK CO 4.13, CI 2.58, PAPi 1.1. She apparently refused cMRI. She was diuresed w/ IV Lasix and transitioned to PO. Placed on low dose GDMT. This was limited by soft BP. Discharged home on 4/21. D/c wt 130 lb.   Today she returns for AHF follow up. Overall feeling kind of bad as she's been having pain in neck and back. Denies palpitations, CP, dizziness, edema, or PND/Orthopnea. Intermittent SOB with exertion. Appetite great. No fever or chills. Weight at home 124-132 pounds. Taking all medications. Denies ETOH, smoking. She is in motorized WC. Has been using this to get around outside of the house. Uses walker ambulating around the home. Walks around 50 ft with her walker 8x day.   She is staying w/ her daughters. Refuses home health PT again  today.   Cardiac Testing  Echo 11/20/2022 1. Very difficult study due to poor echo windows and lack of  visualization of LV endocardium. However, based on limited views, there is  severe biventricular failure with estimated LVEF ~25% (endocardium very  poorly visualized) and severe RV dysfunction.   2. Left ventricular ejection fraction, by estimation, is 25 to 30%. The  left ventricle has severely decreased function. Left ventricular  endocardial border not optimally defined to evaluate regional wall motion.  There is mild left ventricular  hypertrophy. Left ventricular diastolic parameters are consistent with  Grade II diastolic dysfunction (pseudonormalization).   3. Right ventricular systolic function is severely reduced. The right  ventricular size is mildly enlarged.   4. Left atrial size was mildly dilated.   5. The mitral valve is degenerative. Mild to moderate mitral valve  regurgitation. Moderate mitral annular calcification.   6. The aortic valve is tricuspid. There is mild calcification of the  aortic valve. There is mild thickening of the aortic valve. Aortic valve  regurgitation is not visualized. Aortic valve sclerosis/calcification is  present, without any evidence of  aortic stenosis.   7. The inferior vena cava is dilated in size with <50% respiratory  variability, suggesting right atrial pressure of 15 mmHg.   8. Moderate pericardial effusion. There is no evidence of cardiac  tamponade.   Comparison(s): No prior Echocardiogram.      Cath 11/22/2022   Mid LAD lesion is 20% stenosed.   Mid Cx lesion is 20% stenosed.   Mild non-obstructive  disease in the mid Circumflex and mid LAD Moderate caliber non-dominant RCA with no disease.  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 She has received IV Lasix this am. I will  give another 40 mg of IV Lasix tonight. Medical management of mild CAD   Flowsheet Row Most Recent Value  Fick Cardiac Output 4.13 L/min  Fick Cardiac Output Index 2.58 (L/min)/BSA  RA A Wave 22 mmHg  RA V Wave 18 mmHg  RA Mean 16 mmHg  RV Systolic Pressure 48 mmHg  RV Diastolic Pressure 4 mmHg  RV EDP 23 mmHg  PA Systolic Pressure 47 mmHg  PA Diastolic Pressure 29 mmHg  PA Mean 35 mmHg  PW A Wave 19 mmHg  PW V Wave 27 mmHg  PW Mean 24 mmHg  AO Systolic Pressure 127 mmHg  AO Diastolic Pressure 84 mmHg  AO Mean 101 mmHg  LV Systolic Pressure 132 mmHg  LV Diastolic Pressure 5 mmHg  LV EDP 18 mmHg  AOp Systolic Pressure 125 mmHg  AOp Diastolic Pressure 79 mmHg  AOp Mean Pressure 98 mmHg  LVp Systolic Pressure 121 mmHg  LVp Diastolic Pressure 15 mmHg  LVp EDP Pressure 18 mmHg  QP/QS 1  TPVR Index 13.58 HRUI  TSVR Index 39.19 HRUI  PVR SVR Ratio 0.13  TPVR/TSVR Ratio 0.35   Past Medical History:  Diagnosis Date   Anxiety    Arthritis    knees,    Ejection fraction    Normal, echo, June, 2013   GERD (gastroesophageal reflux disease)    Headache(784.0)    hx of migraines    History of hiatal hernia    Hypertension    Irritable bowel syndrome (IBS)    PONV (postoperative nausea and vomiting)    Sinus tachycardia    Persistent sinus tachycardia, June, 2013 ( prior monitor in 2007 had shown no significant abnormalities.)    Current Outpatient Medications  Medication Sig Dispense Refill   benzonatate (TESSALON) 100 MG capsule Take 1 capsule (100 mg total) by mouth 3 (three) times daily as needed for cough. 20 capsule 0   diazepam (VALIUM) 5 MG tablet Take 1 hour prior to MRI 1 tablet 0   digoxin (LANOXIN) 0.125 MG tablet Take 1 tablet (0.125 mg total) by mouth daily. 90 tablet 3   empagliflozin (JARDIANCE) 10 MG TABS tablet Take 1 tablet (10 mg total) by mouth daily. 30 tablet 2   furosemide (LASIX) 20 MG tablet Take 1 tablet (20 mg total) by mouth every Monday,  Wednesday, and Friday. 12 tablet 11   losartan (COZAAR) 25 MG tablet Take 0.5 tablets (12.5 mg total) by mouth daily. 30 tablet 1   metoprolol succinate (TOPROL-XL) 50 MG 24 hr tablet Take 1 tablet (50 mg total) by mouth daily. Take with or immediately following a meal. 30 tablet 2   pantoprazole (PROTONIX) 40 MG tablet Take 1 tablet (40 mg total) by mouth daily at 6 (six) AM. 30 tablet 1   rosuvastatin (CRESTOR) 10 MG tablet Take 1 tablet (10 mg total) by mouth daily. 30 tablet 2   spironolactone (ALDACTONE) 25 MG tablet Take 1 tablet (25 mg total) by mouth daily. 30 tablet 11   traZODone (DESYREL) 50 MG tablet Take 50 mg by mouth at bedtime.     No current facility-administered medications for this encounter.    Allergies  Allergen  Reactions   Codeine Nausea And Vomiting     Social History   Socioeconomic History   Marital status: Widowed    Spouse name: Not on file   Number of children: 1   Years of education: Not on file   Highest education level: Not on file  Occupational History   Occupation: Retired  Tobacco Use   Smoking status: Never   Smokeless tobacco: Never  Vaping Use   Vaping Use: Never used  Substance and Sexual Activity   Alcohol use: Not Currently    Comment: recovering alcoholic x 7 years    Drug use: No   Sexual activity: Not Currently    Birth control/protection: Post-menopausal  Other Topics Concern   Not on file  Social History Narrative   Not on file   Social Determinants of Health   Financial Resource Strain: Low Risk  (11/23/2022)   Overall Financial Resource Strain (CARDIA)    Difficulty of Paying Living Expenses: Not very hard  Food Insecurity: No Food Insecurity (11/19/2022)   Hunger Vital Sign    Worried About Running Out of Food in the Last Year: Never true    Ran Out of Food in the Last Year: Never true  Transportation Needs: No Transportation Needs (11/23/2022)   PRAPARE - Administrator, Civil Service (Medical): No    Lack  of Transportation (Non-Medical): No  Physical Activity: Not on file  Stress: Not on file  Social Connections: Not on file  Intimate Partner Violence: Not At Risk (11/19/2022)   Humiliation, Afraid, Rape, and Kick questionnaire    Fear of Current or Ex-Partner: No    Emotionally Abused: No    Physically Abused: No    Sexually Abused: No      Family History  Problem Relation Age of Onset   Irritable bowel syndrome Sister     Vitals:   12/29/22 1126  BP: 100/68  Pulse: 85  SpO2: 99%  Weight: 60.3 kg (133 lb)    PHYSICAL EXAM: General:  well appearing.  No respiratory difficulty. Arrived in motorized wheelchair HEENT: normal. Some stiffness in neck.  Neck: supple. JVD falt. Carotids 2+ bilat; no bruits. No lymphadenopathy or thyromegaly appreciated. Cor: PMI nondisplaced. Regular rate & rhythm. No rubs, gallops or murmurs. Lungs: clear Abdomen: soft, nontender, nondistended. No hepatosplenomegaly. No bruits or masses. Good bowel sounds. Extremities: no cyanosis, clubbing, rash, edema.  Neuro: alert & oriented x 3, cranial nerves grossly intact. moves all 4 extremities w/o difficulty. Affect pleasant.   Wt Readings from Last 3 Encounters:  12/29/22 60.3 kg (133 lb)  12/14/22 63.5 kg (140 lb)  11/27/22 59.3 kg (130 lb 11.7 oz)    ECG: none today  ReDS: 29 %  ASSESSMENT & PLAN: 1. Chronic Systolic Heart Failure, Severe Biventricular Failure - Newly diagnosed. Echo 4/21 EF 25-30%, RV severely reduced - NICM. LHC mild nonobstructive CAD - RHC w/ elevated R+ L filling pressures, preserved CO (mRA 16, PAP 47/29, mPCWP 24, CO 4.13, CI 2.58, PAPi 1.1)  - NYHA Class III. Volume stable. ReDS 29%. - Continue Jardiance 10 mg daily  - Continue Losartan 25 mg daily. BP too soft for Entresto (brought weekly BP log SBP ranging from 90s-low 100s) - Continue Spironolactone 25 mg daily  - Continue Lasix 20 mg twice weekly  - Continue digoxin 0.125 mg daily, recent level stable - Plan  cMRI (scheduled for July). She is concerned regarding claustrophobia.  Will premedicate w/ Valium x 1  2. Pericardial Effusion  - moderate by echo 4/21, no tamponade physiology  - likely 2/2 high RA pressures  - hemodynamics stable - continue diuretics per above  3. CAD - nonobstructive by cath 4/23 - mLAD 20% stenosis  - mLCx 20% stenosis  - on ? blocker + statin (LDL at goal, 48 mg/DL)  - not on ASA given issues w/ rectal bleeding   4. CKD IIIb - b/l Scr ~1.3 - on SGLT2i  - BMET today  Follow up in 3 months with Dr. Gala Romney. cMRI scheduled for July.   Brynda Peon, AGACNP-BC  12/29/22 11:31 AM  [facility] [address] Heart and Vascular [place] [address] [phone] (office) [phone] (fax)

## 2022-12-29 ENCOUNTER — Ambulatory Visit (HOSPITAL_COMMUNITY)
Admission: RE | Admit: 2022-12-29 | Discharge: 2022-12-29 | Disposition: A | Discharge status: Home / Self Care | Payer: Medicare Other | Source: Ambulatory Visit | Attending: Adult Health | Admitting: Adult Health

## 2022-12-29 ENCOUNTER — Encounter (HOSPITAL_COMMUNITY): Payer: Self-pay

## 2022-12-29 VITALS — BP 100/68 | HR 85 | Wt 133.0 lb

## 2022-12-29 DIAGNOSIS — I5022 Chronic systolic (congestive) heart failure: Secondary | ICD-10-CM | POA: Insufficient documentation

## 2022-12-29 DIAGNOSIS — K589 Irritable bowel syndrome without diarrhea: Secondary | ICD-10-CM | POA: Insufficient documentation

## 2022-12-29 DIAGNOSIS — Z79899 Other long term (current) drug therapy: Secondary | ICD-10-CM | POA: Insufficient documentation

## 2022-12-29 DIAGNOSIS — N1832 Chronic kidney disease, stage 3b: Secondary | ICD-10-CM | POA: Insufficient documentation

## 2022-12-29 DIAGNOSIS — K219 Gastro-esophageal reflux disease without esophagitis: Secondary | ICD-10-CM | POA: Insufficient documentation

## 2022-12-29 DIAGNOSIS — I130 Hypertensive heart and chronic kidney disease with heart failure and stage 1 through stage 4 chronic kidney disease, or unspecified chronic kidney disease: Secondary | ICD-10-CM | POA: Diagnosis not present

## 2022-12-29 DIAGNOSIS — Z7984 Long term (current) use of oral hypoglycemic drugs: Secondary | ICD-10-CM | POA: Diagnosis not present

## 2022-12-29 DIAGNOSIS — K625 Hemorrhage of anus and rectum: Secondary | ICD-10-CM | POA: Insufficient documentation

## 2022-12-29 DIAGNOSIS — F419 Anxiety disorder, unspecified: Secondary | ICD-10-CM | POA: Insufficient documentation

## 2022-12-29 DIAGNOSIS — I3139 Other pericardial effusion (noninflammatory): Secondary | ICD-10-CM | POA: Diagnosis not present

## 2022-12-29 DIAGNOSIS — I428 Other cardiomyopathies: Secondary | ICD-10-CM | POA: Insufficient documentation

## 2022-12-29 DIAGNOSIS — I5082 Biventricular heart failure: Secondary | ICD-10-CM | POA: Insufficient documentation

## 2022-12-29 DIAGNOSIS — I2510 Atherosclerotic heart disease of native coronary artery without angina pectoris: Secondary | ICD-10-CM | POA: Insufficient documentation

## 2022-12-29 LAB — BASIC METABOLIC PANEL
Anion gap: 8 (ref 5–15)
BUN: 16 mg/dL (ref 8–23)
CO2: 26 mmol/L (ref 22–32)
Calcium: 9.4 mg/dL (ref 8.9–10.3)
Chloride: 104 mmol/L (ref 98–111)
Creatinine, Ser: 0.85 mg/dL (ref 0.44–1.00)
GFR, Estimated: >60 mL/min (ref >60)
Glucose, Bld: 63 mg/dL — ABNORMAL LOW (ref 70–99)
Potassium: 3.9 mmol/L (ref 3.5–5.1)
Sodium: 138 mmol/L (ref 135–145)

## 2022-12-29 MED ORDER — METOPROLOL SUCCINATE ER 50 MG PO TB24: 50.0000 mg | ORAL_TABLET | Freq: Every day | ORAL | 11 refills | Status: AC

## 2022-12-29 MED ORDER — EMPAGLIFLOZIN 10 MG PO TABS: 10.0000 mg | ORAL_TABLET | Freq: Every day | ORAL | 11 refills | Status: AC

## 2022-12-29 MED ORDER — LOSARTAN POTASSIUM 25 MG PO TABS: 12.5000 mg | ORAL_TABLET | Freq: Every day | ORAL | 11 refills | Status: DC

## 2022-12-29 NOTE — Patient Instructions (Signed)
Labs done today. We will contact you only if your labs are abnormal.  Metoprolol, Jardiance and Losartan has been refilled.   No other medication changes were made. Please continue all current medications as prescribed.  Your physician recommends that you keep your scheduled follow-up appointment.   If you have any questions or concerns before your next appointment please send [place] a message through [place] or call our office at [phone].    TO LEAVE A MESSAGE FOR THE NURSE SELECT OPTION 2, PLEASE LEAVE A MESSAGE INCLUDING: YOUR NAME DATE OF BIRTH CALL BACK NUMBER REASON FOR CALL**this is important as we prioritize the call backs  YOU WILL RECEIVE A CALL BACK THE SAME DAY AS LONG AS YOU CALL BEFORE 4:00 PM   Do the following things EVERYDAY: Weigh yourself in the morning before breakfast. Write it down and keep it in a log. Take your medicines as prescribed Eat low salt foods--Limit salt (sodium) to 2000 mg per day.  Stay as active as you can everyday Limit all fluids for the day to less than 2 liters   At the [facility], you and your health needs are our priority. As part of our continuing mission to provide you with exceptional heart care, we have created designated Provider Care Teams. These Care Teams include your primary Cardiologist (physician) and Advanced Practice Providers (APPs- Physician Assistants and Nurse Practitioners) who all work together to provide you with the care you need, when you need it.   You may see any of the following providers on your designated Care Team at your next follow up: Dr Arvilla Meres Dr Marca Ancona Dr. Marcos Eke, NP Robbie Lis, [place] [facility] [place], [place] Brynda Peon, NP Karle Plumber, PharmD   Please be sure to bring in all your medications bottles to every appointment.    Thank you for choosing [facility]

## 2022-12-29 NOTE — Progress Notes (Signed)
ReDS Vest / Clip - 12/29/22 1100       ReDS Vest / Clip   Station Marker A    Ruler Value 29    ReDS Value Range Low volume    ReDS Actual Value 29

## 2023-01-20 ENCOUNTER — Other Ambulatory Visit (HOSPITAL_COMMUNITY): Payer: Self-pay | Admitting: Cardiology

## 2023-01-20 NOTE — Telephone Encounter (Signed)
Patient called to request refills No meds left on triage line  Attempted to return call No answer

## 2023-01-24 ENCOUNTER — Other Ambulatory Visit (HOSPITAL_COMMUNITY): Payer: Self-pay | Admitting: Cardiology

## 2023-01-24 MED ORDER — LOSARTAN POTASSIUM 25 MG PO TABS: 25.0000 mg | ORAL_TABLET | Freq: Every day | ORAL | 11 refills | Status: DC

## 2023-01-24 MED ORDER — SPIRONOLACTONE 25 MG PO TABS: 25.0000 mg | ORAL_TABLET | Freq: Every day | ORAL | 11 refills | Status: AC

## 2023-01-24 NOTE — Telephone Encounter (Signed)
PT RETURNED CALL AND REFILLS ADDRESSED

## 2023-03-01 ENCOUNTER — Telehealth (HOSPITAL_COMMUNITY): Payer: Self-pay | Admitting: *Deleted

## 2023-03-01 NOTE — Telephone Encounter (Signed)
Reaching out to patient to offer assistance regarding upcoming cardiac imaging study; pt verbalizes understanding of appt date/time. However she states that she does not want to do her cardiac MRI. Offered to reschedule but she declined. I informed her that I will let her providers know.   Larey Brick RN Navigator Cardiac Imaging [facility] [phone] office [phone] cell

## 2023-03-02 ENCOUNTER — Ambulatory Visit (HOSPITAL_COMMUNITY): Admission: RE | Admit: 2023-03-02 | Payer: Medicare Other | Source: Ambulatory Visit

## 2023-03-26 NOTE — Progress Notes (Signed)
ADVANCED HF CLINIC CONSULT NOTE     Referring Physician: Dr. Cristal Deer  Primary Care: Toma Deiters, MD Primary Cardiologist: Meriam Sprague, MD   HPI:  67 y.o. female with a hx of anxiety, HTN, chronic sinus tachycardia and systolic HF referred by Dr. Cristal Deer for further HF evaluation.   Echo 11/2011 EF normal 65-70%, mild LVH, G1DD, mild MR. Normal RV Echo 07/2016 EF 55-60%, trivial MR, RV mildly reduced, trivial pericardial effusion, left pleural effusion   She recently presented to [facility] on 11/18/22 with right sided weakness and dyspnea. Stroke w/u was negative but she was found to be in acute CHF. BNP 1,370.Marland Kitchen Chest CT and V/Q scans both negative for PE. EKG showed SR 98 bpm w/ frequent PACs and PVCs. Also noted to have runs of NSVT on tele. 2D echo showed severe biventricular HF. LVEF 25-30%, RV severely reduced, GIIDD, mild-mod MR, mild AoV thickening and mod pericardial effusion. R/LHC c/w NICM, angiography w/ mild nonobstructive CAD (20% mLAD and 20% mLCx lesions). RHC with elevated right and left sided filling pressures, RA 16 mmHg, PAP 47/29, mPCWP 24, FICK CO 4.13, CI 2.58, PAPi 1.1. She apparently refused cMRI. She was diuresed w/ IV Lasix and transitioned to PO. Placed on low dose GDMT. This was limited by soft BP. Discharged home on 4/21. D/c wt 130 lb.  Recently seen in [facility].  Weight up 10 pounds from d/c. Getting around with motorized WC. Refused Home PT. Meds adjusted and cMRI ordered but not completed.     Cardiac Testing   Echo 11/20/2022 1. Very difficult study due to poor echo windows and lack of  visualization of LV endocardium. However, based on limited views, there is  severe biventricular failure with estimated LVEF ~25% (endocardium very  poorly visualized) and severe RV dysfunction.   2. Left ventricular ejection fraction, by estimation, is 25 to 30%. The  left ventricle has severely decreased function. Left ventricular  endocardial  border not optimally defined to evaluate regional wall motion.  There is mild left ventricular  hypertrophy. Left ventricular diastolic parameters are consistent with  Grade II diastolic dysfunction (pseudonormalization).   3. Right ventricular systolic function is severely reduced. The right  ventricular size is mildly enlarged.   4. Left atrial size was mildly dilated.   5. The mitral valve is degenerative. Mild to moderate mitral valve  regurgitation. Moderate mitral annular calcification.   6. The aortic valve is tricuspid. There is mild calcification of the  aortic valve. There is mild thickening of the aortic valve. Aortic valve  regurgitation is not visualized. Aortic valve sclerosis/calcification is  present, without any evidence of  aortic stenosis.   7. The inferior vena cava is dilated in size with <50% respiratory  variability, suggesting right atrial pressure of 15 mmHg.   8. Moderate pericardial effusion. There is no evidence of cardiac  tamponade.   Comparison(s): No prior Echocardiogram.      Cath 11/22/2022   Mid LAD lesion is 20% stenosed.   Mid Cx lesion is 20% stenosed.   Mild non-obstructive disease in the mid Circumflex and mid LAD Moderate caliber non-dominant RCA with no disease.  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 She has received IV Lasix this am. I will give another 40 mg of IV Lasix tonight. Medical management of mild CAD  Flowsheet Row Most Recent Value  Fick Cardiac Output 4.13 L/min  Fick Cardiac Output Index 2.58 (L/min)/BSA  RA A Wave 22 mmHg  RA V Wave 18 mmHg  RA Mean 16 mmHg  RV Systolic Pressure 48 mmHg  RV Diastolic Pressure 4 mmHg  RV EDP 23 mmHg  PA Systolic Pressure 47 mmHg  PA Diastolic Pressure 29 mmHg  PA Mean 35 mmHg  PW A Wave 19 mmHg  PW V Wave 27 mmHg  PW Mean 24 mmHg   AO Systolic Pressure 127 mmHg  AO Diastolic Pressure 84 mmHg  AO Mean 101 mmHg  LV Systolic Pressure 132 mmHg  LV Diastolic Pressure 5 mmHg  LV EDP 18 mmHg  AOp Systolic Pressure 125 mmHg  AOp Diastolic Pressure 79 mmHg  AOp Mean Pressure 98 mmHg  LVp Systolic Pressure 121 mmHg  LVp Diastolic Pressure 15 mmHg  LVp EDP Pressure 18 mmHg  QP/QS 1  TPVR Index 13.58 HRUI  TSVR Index 39.19 HRUI  PVR SVR Ratio 0.13  TPVR/TSVR Ratio 0.35     Review of Systems: [y] = yes, [ ]  = no   General: Weight gain [Y ]; Weight loss [ ] ; Anorexia [ ] ; Fatigue [ ] ; Fever [ ] ; Chills [ ] ; Weakness [ ]   Cardiac: Chest pain/pressure [ ] ; Resting SOB [ ] ; Exertional SOB [Y ]; Orthopnea [ Y]; Pedal Edema [ ] ; Palpitations [ ] ; Syncope [ ] ; Presyncope [ ] ; Paroxysmal nocturnal dyspnea[ ]   Pulmonary: Cough [ ] ; Wheezing[ ] ; Hemoptysis[ ] ; Sputum [ ] ; Snoring [ ]   GI: Vomiting[ ] ; Dysphagia[ ] ; Melena[ ] ; Hematochezia [ ] ; Heartburn[ ] ; Abdominal pain [ ] ; Constipation [ ] ; Diarrhea [ ] ; BRBPR [ ]   GU: Hematuria[ ] ; Dysuria [ ] ; Nocturia[ ]   Vascular: Pain in legs with walking [ ] ; Pain in feet with lying flat [ ] ; Non-healing sores [ ] ; Stroke [ ] ; TIA [ ] ; Slurred speech [ ] ;  Neuro: Headaches[ ] ; Vertigo[ ] ; Seizures[ ] ; Paresthesias[ ] ;Blurred vision [ ] ; Diplopia [ ] ; Vision changes [ ]   Ortho/Skin: Arthritis [ Y]; Joint pain [ Y]; Muscle pain [ ] ; Joint swelling [ ] ; Back Pain [ ] ; Rash [ ]   Psych: Depression[ ] ; Anxiety[ ]   Heme: Bleeding problems [ ] ; Clotting disorders [ ] ; Anemia [ ]   Endocrine: Diabetes [ ] ; Thyroid dysfunction[ ]    Past Medical History:  Diagnosis Date   Anxiety    Arthritis    knees,    Ejection fraction    Normal, echo, June, 2013   GERD (gastroesophageal reflux disease)    Headache(784.0)    hx of migraines    History of hiatal hernia    Hypertension    Irritable bowel syndrome (IBS)    PONV (postoperative nausea and vomiting)    Sinus tachycardia    Persistent  sinus tachycardia, June, 2013 ( prior monitor in 2007 had shown no significant abnormalities.)    Current Outpatient Medications  Medication Sig Dispense Refill   benzonatate (TESSALON) 100 MG capsule Take 1 capsule (100 mg total) by mouth 3 (three) times daily as needed for cough. 20 capsule 0   diazepam (VALIUM) 5 MG tablet Take 1 hour prior to MRI 1 tablet 0   digoxin (LANOXIN) 0.125 MG tablet Take 1 tablet (0.125 mg total) by mouth daily. 90 tablet 3   empagliflozin (JARDIANCE) 10 MG TABS tablet Take 1 tablet (10 mg total) by mouth daily. 30 tablet 11   furosemide (LASIX) 20 MG  tablet Take 1 tablet (20 mg total) by mouth every Monday, Wednesday, and Friday. 12 tablet 11   losartan (COZAAR) 25 MG tablet Take 1 tablet (25 mg total) by mouth daily. 30 tablet 11   metoprolol succinate (TOPROL-XL) 50 MG 24 hr tablet Take 1 tablet (50 mg total) by mouth daily. Take with or immediately following a meal. 30 tablet 11   pantoprazole (PROTONIX) 40 MG tablet Take 1 tablet (40 mg total) by mouth daily at 6 (six) AM. 30 tablet 1   rosuvastatin (CRESTOR) 10 MG tablet Take 1 tablet (10 mg total) by mouth daily. 30 tablet 2   spironolactone (ALDACTONE) 25 MG tablet Take 1 tablet (25 mg total) by mouth daily. 30 tablet 11   traZODone (DESYREL) 50 MG tablet Take 50 mg by mouth at bedtime.     No current facility-administered medications for this encounter.    Allergies  Allergen Reactions   Codeine Nausea And Vomiting      Social History   Socioeconomic History   Marital status: Widowed    Spouse name: Not on file   Number of children: 1   Years of education: Not on file   Highest education level: Not on file  Occupational History   Occupation: Retired  Tobacco Use   Smoking status: Never   Smokeless tobacco: Never  Vaping Use   Vaping status: Never Used  Substance and Sexual Activity   Alcohol use: Not Currently    Comment: recovering alcoholic x 7 years    Drug use: No   Sexual  activity: Not Currently    Birth control/protection: Post-menopausal  Other Topics Concern   Not on file  Social History Narrative   Not on file   Social Determinants of Health   Financial Resource Strain: Low Risk  (11/23/2022)   Overall Financial Resource Strain (CARDIA)    Difficulty of Paying Living Expenses: Not very hard  Food Insecurity: No Food Insecurity (11/19/2022)   Hunger Vital Sign    Worried About Running Out of Food in the Last Year: Never true    Ran Out of Food in the Last Year: Never true  Transportation Needs: No Transportation Needs (11/23/2022)   PRAPARE - Administrator, Civil Service (Medical): No    Lack of Transportation (Non-Medical): No  Physical Activity: Not on file  Stress: Not on file  Social Connections: Not on file  Intimate Partner Violence: Not At Risk (11/19/2022)   Humiliation, Afraid, Rape, and Kick questionnaire    Fear of Current or Ex-Partner: No    Emotionally Abused: No    Physically Abused: No    Sexually Abused: No      Family History  Problem Relation Age of Onset   Irritable bowel syndrome Sister     There were no vitals filed for this visit.   PHYSICAL EXAM: General:  elderly F, in WC No respiratory difficulty HEENT: normal Neck: supple. JVD 8 cm. Carotids 2+ bilat; no bruits. No lymphadenopathy or thryomegaly appreciated. Cor: PMI nondisplaced. Regular rate & rhythm. No rubs, gallops or murmurs. Lungs: decreased BS at the bases bilaterally, no wheezing, rhonchi or rales  Abdomen: soft, nontender, nondistended. No hepatosplenomegaly. No bruits or masses. Good bowel sounds. Extremities: no cyanosis, clubbing, rash, edema Neuro: alert & oriented x 3, cranial nerves grossly intact. moves all 4 extremities w/o difficulty. Affect pleasant.  ECG: sinus tach w/ PACs 102 bpm    ASSESSMENT & PLAN:  1. Chronic Systolic Heart Failure,  Severe Biventricular Failure - Newly diagnosed. Echo 4/21 EF 25-30%, RV severely  reduced - NICM. LHC mild nonobstructive CAD - RHC w/ elevated R+ L filling pressures, preserved CO (mRA 16, PAP 47/29, mPCWP 24, CO 4.13, CI 2.58, PAPi 1.1)  - NYHA Class III. Mild volume overload on exam, wt up since d/c - Continue Jardiance 10 mg daily  - Continue Losartan 12.5 mg daily. BP too soft for Entresto  - Continue spironolactone 25 - Continue Lasix 20 mg twice weekly  - Continue digoxin 0.125 mg daily  - cMRI ordered at last visit but not completed.  -  2. Pericardial Effusion  - moderate by echo 4/21, no tamponade physiology  - likely 2/2 high RA pressures  - hemodynamics stable - continue diuretics per above  3. CAD - nonobstructive by cath 4/23 - mLAD 20% stenosis  - mLCx 20% stenosis  - on ? blocker + statin (LDL at goal, 48 mg/DL)  - not on ASA given issues w/ rectal bleeding   4. CKD IIIb - b/l Scr ~1.3 - on SGLT2i  - check BMP   Arvilla Meres, MD  5:15 PM

## 2023-03-27 ENCOUNTER — Encounter (HOSPITAL_COMMUNITY): Payer: Self-pay | Admitting: Internal Medicine

## 2023-03-27 ENCOUNTER — Ambulatory Visit (HOSPITAL_COMMUNITY)
Admission: RE | Admit: 2023-03-27 | Discharge: 2023-03-27 | Disposition: A | Discharge status: Home / Self Care | Payer: Medicare Other | Source: Ambulatory Visit | Attending: Internal Medicine | Admitting: Internal Medicine

## 2023-03-27 VITALS — BP 104/70 | HR 70 | Wt 137.4 lb

## 2023-03-27 DIAGNOSIS — I5082 Biventricular heart failure: Secondary | ICD-10-CM | POA: Insufficient documentation

## 2023-03-27 DIAGNOSIS — I5020 Unspecified systolic (congestive) heart failure: Secondary | ICD-10-CM | POA: Diagnosis present

## 2023-03-27 DIAGNOSIS — I2510 Atherosclerotic heart disease of native coronary artery without angina pectoris: Secondary | ICD-10-CM | POA: Diagnosis not present

## 2023-03-27 DIAGNOSIS — I5022 Chronic systolic (congestive) heart failure: Secondary | ICD-10-CM | POA: Insufficient documentation

## 2023-03-27 DIAGNOSIS — I3139 Other pericardial effusion (noninflammatory): Secondary | ICD-10-CM | POA: Diagnosis not present

## 2023-03-27 DIAGNOSIS — N1832 Chronic kidney disease, stage 3b: Secondary | ICD-10-CM | POA: Diagnosis not present

## 2023-03-27 DIAGNOSIS — I428 Other cardiomyopathies: Secondary | ICD-10-CM | POA: Diagnosis not present

## 2023-03-27 DIAGNOSIS — I130 Hypertensive heart and chronic kidney disease with heart failure and stage 1 through stage 4 chronic kidney disease, or unspecified chronic kidney disease: Secondary | ICD-10-CM | POA: Diagnosis not present

## 2023-03-27 DIAGNOSIS — M25511 Pain in right shoulder: Secondary | ICD-10-CM | POA: Insufficient documentation

## 2023-03-27 DIAGNOSIS — I110 Hypertensive heart disease with heart failure: Secondary | ICD-10-CM | POA: Diagnosis present

## 2023-03-27 LAB — BASIC METABOLIC PANEL
Anion gap: 9 (ref 5–15)
BUN: 21 mg/dL (ref 8–23)
CO2: 23 mmol/L (ref 22–32)
Calcium: 9.4 mg/dL (ref 8.9–10.3)
Chloride: 105 mmol/L (ref 98–111)
Creatinine, Ser: 0.99 mg/dL (ref 0.44–1.00)
GFR, Estimated: >60 mL/min (ref >60)
Glucose, Bld: 99 mg/dL (ref 70–99)
Potassium: 4.5 mmol/L (ref 3.5–5.1)
Sodium: 137 mmol/L (ref 135–145)

## 2023-03-27 LAB — BRAIN NATRIURETIC PEPTIDE: B Natriuretic Peptide: 122.8 pg/mL — ABNORMAL HIGH (ref 0.0–100.0)

## 2023-03-27 NOTE — Patient Instructions (Signed)
There has been no changes to your medications.  Labs done today, your results will be available in MyChart, we will contact you for abnormal readings.  Your physician has requested that you have an echocardiogram. Echocardiography is a painless test that uses sound waves to create images of your heart. It provides your doctor with information about the size and shape of your heart and how well your heart's chambers and valves are working. This procedure takes approximately one hour. There are no restrictions for this procedure. Please do NOT wear cologne, perfume, aftershave, or lotions (deodorant is allowed). Please arrive 15 minutes prior to your appointment time. This will be done at [facility].  They will call you to arrange the test.  You been referred to the sports medicine at in [place]. They will call you to arrange your appointment.   Your physician recommends that you schedule a follow-up appointment in: 4 months.  If you have any questions or concerns before your next appointment please send [place] a message through [place] or call our office at [phone].    TO LEAVE A MESSAGE FOR THE NURSE SELECT OPTION 2, PLEASE LEAVE A MESSAGE INCLUDING: YOUR NAME DATE OF BIRTH CALL BACK NUMBER REASON FOR CALL**this is important as we prioritize the call backs  YOU WILL RECEIVE A CALL BACK THE SAME DAY AS LONG AS YOU CALL BEFORE 4:00 PM  At the [facility], you and your health needs are our priority. As part of our continuing mission to provide you with exceptional heart care, we have created designated Provider Care Teams. These Care Teams include your primary Cardiologist (physician) and Advanced Practice Providers (APPs- Physician Assistants and Nurse Practitioners) who all work together to provide you with the care you need, when you need it.   You may see any of the following providers on your designated Care Team at your next follow up: Dr Arvilla Meres Dr Marca Ancona Dr. Marcos Eke, NP Robbie Lis, [place] [facility] [place], [place] Brynda Peon, NP Karle Plumber, PharmD   Please be sure to bring in all your medications bottles to every appointment.    Thank you for choosing [facility]

## 2023-04-05 ENCOUNTER — Other Ambulatory Visit (INDEPENDENT_AMBULATORY_CARE_PROVIDER_SITE_OTHER): Payer: Medicare Other

## 2023-04-05 ENCOUNTER — Ambulatory Visit (INDEPENDENT_AMBULATORY_CARE_PROVIDER_SITE_OTHER): Payer: Medicare Other | Admitting: Orthopedic Surgery

## 2023-04-05 ENCOUNTER — Encounter: Payer: Self-pay | Admitting: Orthopedic Surgery

## 2023-04-05 VITALS — BP 127/84 | HR 111 | Ht 62.0 in | Wt 137.0 lb

## 2023-04-05 DIAGNOSIS — M7501 Adhesive capsulitis of right shoulder: Secondary | ICD-10-CM | POA: Diagnosis not present

## 2023-04-05 DIAGNOSIS — G8929 Other chronic pain: Secondary | ICD-10-CM | POA: Diagnosis not present

## 2023-04-05 DIAGNOSIS — M25511 Pain in right shoulder: Secondary | ICD-10-CM

## 2023-04-05 NOTE — Patient Instructions (Signed)

## 2023-04-05 NOTE — Progress Notes (Signed)
New Patient Visit  Assessment: SREYA SNUFFER is a 67 y.o. female with the following: 1. Adhesive capsulitis of right shoulder   Plan: IESHEA MCKOWN has pain in her right shoulder.  No specific injury.  She tolerates very little range of motion.  Radiographs are negative.  Presentation is most consistent with adhesive capsulitis.  I recommended a subacromial steroid injection.  This was completed clinic today.  Depending on the efficacy in the injection, we could consider ultrasound-guided injections to distend the capsule, as well as a referral to a colleague for repeated injections in order to improve her symptoms.  She states understanding.  She will follow-up as needed.   Procedure note injection - Right shoulder    Verbal consent was obtained to inject the right shoulder, subacromial space Timeout was completed to confirm the site of injection.   The skin was prepped with alcohol and ethyl chloride was sprayed at the injection site.  A 21-gauge needle was used to inject 40 mg of Depo-Medrol and 1% lidocaine (4 cc) into the subacromial space of the right shoulder using a posterolateral approach.  There were no complications.  A sterile bandage was applied.     Follow-up: Return if symptoms worsen or fail to improve.  Subjective:  Chief Complaint  Patient presents with   Shoulder Pain    R shoulder pain for 6 mos getting worse. Pain into her neck but no pain into her hand.     History of Present Illness: AMNERIS GRIVAS is a 67 y.o. female who presents for evaluation of right shoulder pain.  She is right-hand dominant.  She states she has had pain in the right shoulder for at least 6 months.  No inciting event.  No previous injury.  She has pain with minimal movement.  Medications have not been effective.  She has been unable to use her right arm.  She has not worked with therapy.  No injections.   Review of Systems: No fevers or chills No numbness or tingling No chest  pain No shortness of breath No bowel or bladder dysfunction No GI distress No headaches   Medical History:  Past Medical History:  Diagnosis Date   Anxiety    Arthritis    knees,    Ejection fraction    Normal, echo, June, 2013   GERD (gastroesophageal reflux disease)    Headache(784.0)    hx of migraines    History of hiatal hernia    Hypertension    Irritable bowel syndrome (IBS)    PONV (postoperative nausea and vomiting)    Sinus tachycardia    Persistent sinus tachycardia, June, 2013 ( prior monitor in 2007 had shown no significant abnormalities.)    Past Surgical History:  Procedure Laterality Date   CHOLECYSTECTOMY     ESOPHAGOGASTRODUODENOSCOPY N/A 08/19/2016   Procedure: ESOPHAGOGASTRODUODENOSCOPY (EGD);  Surgeon: Malissa Hippo, MD;  Location: [facility];  Service: Endoscopy;  Laterality: N/A;  2:50   EYE SURGERY     cataract   HEMORRHOID SURGERY N/A 05/20/2013   Procedure: EXTENSIVE HEMORRHOIDECTOMY;  Surgeon: Dalia Heading, MD;  Location: [facility] ORS;  Service: General;  Laterality: N/A;   JOINT REPLACEMENT     right knee replacement    OTHER SURGICAL HISTORY     left breast biopsy - benign    OTHER SURGICAL HISTORY     hx of skin cancer surgery    RIGHT/LEFT HEART CATH AND CORONARY ANGIOGRAPHY N/A 11/22/2022  Procedure: RIGHT/LEFT HEART CATH AND CORONARY ANGIOGRAPHY;  Surgeon: Kathleene Hazel, MD;  Location: [facility] INVASIVE CV LAB;  Service: Cardiovascular;  Laterality: N/A;   TOTAL KNEE ARTHROPLASTY  11/28/2011   Procedure: TOTAL KNEE ARTHROPLASTY;  Surgeon: Loanne Drilling, MD;  Location: [facility] ORS;  Service: Orthopedics;  Laterality: Left;   TOTAL KNEE REVISION Right 02/26/2020   Procedure: TOTAL KNEE REVISION;  Surgeon: Ollen Gross, MD;  Location: [facility] ORS;  Service: Orthopedics;  Laterality: Right;    TUBAL LIGATION      Family History  Problem Relation Age of Onset   Irritable bowel syndrome Sister    Social History   Tobacco Use    Smoking status: Never   Smokeless tobacco: Never  Vaping Use   Vaping status: Never Used  Substance Use Topics   Alcohol use: Not Currently    Comment: recovering alcoholic x 7 years    Drug use: No    Allergies  Allergen Reactions   Codeine Nausea And Vomiting    Current Meds  Medication Sig   digoxin (LANOXIN) 0.125 MG tablet Take 1 tablet (0.125 mg total) by mouth daily.   empagliflozin (JARDIANCE) 10 MG TABS tablet Take 1 tablet (10 mg total) by mouth daily.   furosemide (LASIX) 20 MG tablet Take 20 mg by mouth 2 (two) times a week.   levothyroxine (SYNTHROID) 88 MCG tablet Take 88 mcg by mouth daily before breakfast.   losartan (COZAAR) 25 MG tablet Take 1 tablet (25 mg total) by mouth daily.   metoprolol succinate (TOPROL-XL) 50 MG 24 hr tablet Take 1 tablet (50 mg total) by mouth daily. Take with or immediately following a meal.   pantoprazole (PROTONIX) 40 MG tablet Take 1 tablet (40 mg total) by mouth daily at 6 (six) AM.   rosuvastatin (CRESTOR) 10 MG tablet Take 1 tablet (10 mg total) by mouth daily.   spironolactone (ALDACTONE) 25 MG tablet Take 1 tablet (25 mg total) by mouth daily.    Objective: BP 127/84   Pulse (!) 111   Ht 5\' 2"  (1.575 m)   Wt 137 lb (62.1 kg)   BMI 25.06 kg/m   Physical Exam:  General: Alert and oriented. and No acute distress.  Right shoulder without deformity.  Diffuse tenderness to palpation.  She tolerates very little range of motion.  Positive drop arm test.  Passive motion to 90 degrees.  15 degrees of external rotation on the right, compared to 40 degrees on the left.  There is a warm well-perfused.  Tenderness palpation within the trapezius muscle on the right.    IMAGING: I personally ordered and reviewed the following images  X-rays of the right shoulder obtained in clinic today.  No acute injuries are noted.  Well-positioned glenohumeral joint.  Mild loss of joint space within the glenohumeral joint.  No evidence of proximal  humeral migration.  Minimal degenerative changes of the [facility] joint.  No bony lesions.  Impression: Negative right shoulder x-ray   New Medications:  No orders of the defined types were placed in this encounter.     Oliver Barre, MD  04/05/2023 12:24 PM

## 2023-05-01 ENCOUNTER — Telehealth (HOSPITAL_COMMUNITY): Payer: Self-pay | Admitting: *Deleted

## 2023-05-01 NOTE — Telephone Encounter (Signed)
Pt declined CMRI

## 2023-05-04 ENCOUNTER — Ambulatory Visit (HOSPITAL_COMMUNITY)
Admission: RE | Admit: 2023-05-04 | Discharge: 2023-05-04 | Disposition: A | Discharge status: Home / Self Care | Payer: 59 | Source: Ambulatory Visit | Attending: Internal Medicine | Admitting: Internal Medicine

## 2023-05-04 DIAGNOSIS — I5022 Chronic systolic (congestive) heart failure: Secondary | ICD-10-CM | POA: Diagnosis not present

## 2023-05-04 LAB — ECHOCARDIOGRAM COMPLETE
AR max vel: 1.88 cm2
AV Area VTI: 2.19 cm2
AV Area mean vel: 1.96 cm2
AV Mean grad: 3.1 mmHg
AV Peak grad: 6.5 mmHg
Ao pk vel: 1.27 m/s
Area-P 1/2: 3.37 cm2
S' Lateral: 2.4 cm

## 2023-05-04 NOTE — Progress Notes (Signed)
*  PRELIMINARY RESULTS* Echocardiogram 2D Echocardiogram has been performed.  Stacey Drain 05/04/2023, 11:38 AM

## 2023-05-23 ENCOUNTER — Telehealth (HOSPITAL_COMMUNITY): Payer: Self-pay | Admitting: *Deleted

## 2023-05-23 NOTE — Telephone Encounter (Signed)
Pt given echo results

## 2023-05-30 DIAGNOSIS — E7849 Other hyperlipidemia: Secondary | ICD-10-CM | POA: Diagnosis not present

## 2023-05-30 DIAGNOSIS — D649 Anemia, unspecified: Secondary | ICD-10-CM | POA: Diagnosis not present

## 2023-05-30 DIAGNOSIS — I10 Essential (primary) hypertension: Secondary | ICD-10-CM | POA: Diagnosis not present

## 2023-05-30 DIAGNOSIS — K219 Gastro-esophageal reflux disease without esophagitis: Secondary | ICD-10-CM | POA: Diagnosis not present

## 2023-05-30 DIAGNOSIS — Z6824 Body mass index (BMI) 24.0-24.9, adult: Secondary | ICD-10-CM | POA: Diagnosis not present

## 2023-05-30 DIAGNOSIS — N182 Chronic kidney disease, stage 2 (mild): Secondary | ICD-10-CM | POA: Diagnosis not present

## 2023-05-30 DIAGNOSIS — M10362 Gout due to renal impairment, left knee: Secondary | ICD-10-CM | POA: Diagnosis not present

## 2023-05-30 DIAGNOSIS — I5032 Chronic diastolic (congestive) heart failure: Secondary | ICD-10-CM | POA: Diagnosis not present

## 2023-07-11 ENCOUNTER — Ambulatory Visit: Payer: 59 | Admitting: Orthopedic Surgery

## 2023-07-26 NOTE — Progress Notes (Signed)
ADVANCED HF CLINIC PROGRESS NOTE     Referring Physician: Dr. Cristal Deer  Primary Care: Toma Deiters, MD Primary Cardiologist: Meriam Sprague, MD (Inactive) AHF: Dr. Gala Romney    Reason for Visit: F/u for HFrEF>>HFimpEF  HPI:  67 y.o. female with a hx of anxiety, HTN, chronic sinus tachycardia and systolic HF referred by Dr. Cristal Deer for further HF evaluation.   Echo 11/2011 EF normal 65-70%, mild LVH, G1DD, mild MR. Normal RV Echo 07/2016 EF 55-60%, trivial MR, RV mildly reduced, trivial pericardial effusion, left pleural effusion   [facility] with right sided weakness and dyspnea. Stroke w/u was negative but she was found to be in acute CHF. BNP 1,370.Marland Kitchen Chest CT and V/Q scans both negative for PE. EKG showed SR 98 bpm w/ frequent PACs and PVCs. Also noted to have runs of NSVT on tele. 2D echo showed severe biventricular HF. LVEF 25-30%, RV severely reduced, GIIDD, mild-mod MR, mild AoV thickening and mod pericardial effusion. R/LHC c/w NICM, angiography w/ mild nonobstructive CAD (20% mLAD and 20% mLCx lesions). RHC with elevated right and left sided filling pressures, RA 16 mmHg, PAP 47/29, mPCWP 24, FICK CO 4.13, CI 2.58, PAPi 1.1. She apparently refused cMRI. She was diuresed w/ IV Lasix and transitioned to PO. Placed on low dose GDMT. This was limited by soft BP. Discharged home on 4/21. D/c wt 130 lb.   She was referred to [facility] clinic and ultimately referred to the [facility]. GDMT titrated and referred to Dr. Gala Romney. Had f/u echo 9/24 that showed improved cardiac function. LVEF normalized, 65-70%, RV normal. Small pericardial effusion present.   She presents back now for 3 month f/u. Here w/ daughter. In WC. Not very active at baseline. Gets around home w/ use of a walker. Able to move from room to room and do her basic ADLs w/o exertional dyspnea. Denies chest pain. No palpitations. Reports full med compliance. Tolerating well w/o side  effects. BP controlled. Her only complaint is LUE hand and RLE foot swelling. Each contralateral sides w/o edema. She also has no proximal edema on either affected side. She endorses associated pain/discomfort but denies loss of sensation. She thinks likely gout. Has been compliant w/ Allopurinol  She does not appear volume overloaded on exam. ReDs normal at 27%,     Cardiac Testing   Echo 11/20/2022 1. Very difficult study due to poor echo windows and lack of  visualization of LV endocardium. However, based on limited views, there is  severe biventricular failure with estimated LVEF ~25% (endocardium very  poorly visualized) and severe RV dysfunction.   2. Left ventricular ejection fraction, by estimation, is 25 to 30%. The  left ventricle has severely decreased function. Left ventricular  endocardial border not optimally defined to evaluate regional wall motion.  There is mild left ventricular  hypertrophy. Left ventricular diastolic parameters are consistent with  Grade II diastolic dysfunction (pseudonormalization).   3. Right ventricular systolic function is severely reduced. The right  ventricular size is mildly enlarged.   4. Left atrial size was mildly dilated.   5. The mitral valve is degenerative. Mild to moderate mitral valve  regurgitation. Moderate mitral annular calcification.   6. The aortic valve is tricuspid. There is mild calcification of the  aortic valve. There is mild thickening of the aortic valve. Aortic valve  regurgitation is not visualized. Aortic valve sclerosis/calcification is  present, without any evidence of  aortic stenosis.  7. The inferior vena cava is dilated in size with <50% respiratory  variability, suggesting right atrial pressure of 15 mmHg.   8. Moderate pericardial effusion. There is no evidence of cardiac  tamponade.   Comparison(s): No prior Echocardiogram.      Cath 11/22/2022   Mid LAD lesion is 20% stenosed.   Mid Cx lesion is 20%  stenosed.   Mild non-obstructive disease in the mid Circumflex and mid LAD Moderate caliber non-dominant RCA with no disease.  Prominent Thebesian vein network noted on angiography Elevated right and left heart pressures Fick Cardiac Output 4.1 L/min Cardiac index 2.58     Echo 04/2023  1. Left ventricular ejection fraction, by estimation, is 65 to 70%. The  left ventricle has hyperdynamic function. The left ventricle has no  regional wall motion abnormalities. Left ventricular diastolic parameters  are indeterminate. Elevated left atrial   pressure.   2. Right ventricular systolic function is normal. The right ventricular  size is normal. Tricuspid regurgitation signal is inadequate for assessing  PA pressure.   3. A small pericardial effusion is present. The pericardial effusion is  posterior to the left ventricle.   4. The mitral valve is normal in structure. Trivial mitral valve  regurgitation. No evidence of mitral stenosis.   5. The aortic valve is tricuspid. Aortic valve regurgitation is not  visualized. No aortic stenosis is present.   6. The inferior vena cava is normal in size with greater than 50%  respiratory variability, suggesting right atrial pressure of 3 mmHg.     Past Medical History:  Diagnosis Date   Anxiety    Arthritis    knees,    Ejection fraction    Normal, echo, June, 2013   GERD (gastroesophageal reflux disease)    Headache(784.0)    hx of migraines    History of hiatal hernia    Hypertension    Irritable bowel syndrome (IBS)    PONV (postoperative nausea and vomiting)    Sinus tachycardia    Persistent sinus tachycardia, June, 2013 ( prior monitor in 2007 had shown no significant abnormalities.)    Current Outpatient Medications  Medication Sig Dispense Refill   allopurinol (ZYLOPRIM) 100 MG tablet Take 100 mg by mouth daily.     empagliflozin (JARDIANCE) 10 MG TABS tablet Take 1 tablet (10 mg total) by mouth daily. 30 tablet 11    furosemide (LASIX) 20 MG tablet Take 20 mg by mouth 2 (two) times a week.     levothyroxine (SYNTHROID) 88 MCG tablet Take 88 mcg by mouth daily before breakfast.     losartan (COZAAR) 25 MG tablet Take 1 tablet (25 mg total) by mouth daily. 30 tablet 11   metoprolol succinate (TOPROL-XL) 50 MG 24 hr tablet Take 1 tablet (50 mg total) by mouth daily. Take with or immediately following a meal. 30 tablet 11   rosuvastatin (CRESTOR) 10 MG tablet Take 1 tablet (10 mg total) by mouth daily. 30 tablet 2   spironolactone (ALDACTONE) 25 MG tablet Take 1 tablet (25 mg total) by mouth daily. 30 tablet 11   No current facility-administered medications for this encounter.    Allergies  Allergen Reactions   Codeine Nausea And Vomiting      Social History   Socioeconomic History   Marital status: Widowed    Spouse name: Not on file   Number of children: 1   Years of education: Not on file   Highest education level: Not on file  Occupational History   Occupation: Retired  Tobacco Use   Smoking status: Never   Smokeless tobacco: Never  Vaping Use   Vaping status: Never Used  Substance and Sexual Activity   Alcohol use: Not Currently    Comment: recovering alcoholic x 7 years    Drug use: No   Sexual activity: Not Currently    Birth control/protection: Post-menopausal  Other Topics Concern   Not on file  Social History Narrative   Not on file   Social Drivers of Health   Financial Resource Strain: Low Risk  (11/23/2022)   Overall Financial Resource Strain (CARDIA)    Difficulty of Paying Living Expenses: Not very hard  Food Insecurity: No Food Insecurity (11/19/2022)   Hunger Vital Sign    Worried About Running Out of Food in the Last Year: Never true    Ran Out of Food in the Last Year: Never true  Transportation Needs: No Transportation Needs (11/23/2022)   PRAPARE - Administrator, Civil Service (Medical): No    Lack of Transportation (Non-Medical): No  Physical  Activity: Not on file  Stress: Not on file  Social Connections: Not on file  Intimate Partner Violence: Not At Risk (11/19/2022)   Humiliation, Afraid, Rape, and Kick questionnaire    Fear of Current or Ex-Partner: No    Emotionally Abused: No    Physically Abused: No    Sexually Abused: No      Family History  Problem Relation Age of Onset   Irritable bowel syndrome Sister     Vitals:   07/27/23 0906  BP: 134/78  Pulse: 97  SpO2: 98%  Weight: 63.5 kg (140 lb)     PHYSICAL EXAM: ReDs 27%  General:  thin elderly F, in WC. No respiratory difficulty HEENT: normal Neck: supple. no JVD. Carotids 2+ bilat; no bruits. No lymphadenopathy or thyromegaly appreciated. Cor: PMI nondisplaced. Regular rate & rhythm. No rubs, gallops or murmurs. Lungs: clear Abdomen: soft, nontender, nondistended. No hepatosplenomegaly. No bruits or masses. Good bowel sounds. Extremities: no cyanosis, clubbing, rash. + distal LUE swelling (hand), distal RLE swelling (foot), no proximal ext swelling, + increased warmth both effective extremities w/ 2+ radial and DPs  Neuro: alert & oriented x 3, cranial nerves grossly intact. moves all 4 extremities w/o difficulty. Affect pleasant.   EKG: NSR 99 bpm    ASSESSMENT & PLAN:  1. 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 LHC mild nonobstructive CAD - RHC w/ elevated R+ L filling pressures, preserved CO (mRA 16, PAP 47/29, mPCWP 24, CO 4.13, CI 2.58, PAPi 1.1)  - refused cMRI due to claustrophobia  - improved w/ GDMT. F/u Echo 9/24 w/ normalized LVEF, 65-70%, RV normal  - Euvolemic on exam and ReDs, 27% - NYHA Class II-III, though not very functional at baseline  - Continue Jardiance 10 mg daily  - Continue Losartan 25 mg daily  - Continue spironolactone 25 - Continue Lasix 20 mg twice weekly  - Stop digoxin w/ normalized EF  - Check BMP and BNP today    2.  Pericardial Effusion  - moderate by echo 4/21, no tamponade physiology >>likely 2/2 high RA pressures  - repeat echo 9/24 showed small effusion    3. CAD - nonobstructive by cath 4/23 - mLAD 20% stenosis  - mLCx 20% stenosis  - stable w/o anginal symptoms  - on ? blocker + statin (LDL  at goal, 48 mg/DL)  - not on ASA given issues w/ rectal bleeding    4. CKD IIIb - b/l Scr ~1.3 - on SGLT2i  - check labs today  5. Distal LUE and Distal RLE Swelling - not diffuse, localized. Suspect likely acute gout flare, will check uric acid and if elevated will prescribe burst of prednisone - w/ 2 opposite sites of swelling, also need to r/o possible embolic phenomenon (DVT), though this is also unlikely given the absence of proximal ext swelling. Will start w/ d-dimer. If elevated and if uric acid is negative (eliminating likelihood that this is gout) will send for venous doppler studies. Presence of palpable 2+ distal pulses r/o likelihood of arterial disease, sensation is also intact which is reassuring    Stable from HF standpoint. Plan f/u in 4-6 months   Robbie Lis, PA-C  9:51 AM

## 2023-07-27 ENCOUNTER — Telehealth (HOSPITAL_COMMUNITY): Payer: Self-pay

## 2023-07-27 ENCOUNTER — Ambulatory Visit (HOSPITAL_COMMUNITY)
Admission: RE | Admit: 2023-07-27 | Discharge: 2023-07-27 | Disposition: A | Discharge status: Home / Self Care | Payer: 59 | Source: Ambulatory Visit | Attending: Cardiology | Admitting: Cardiology

## 2023-07-27 ENCOUNTER — Encounter (HOSPITAL_COMMUNITY): Payer: Self-pay

## 2023-07-27 VITALS — BP 134/78 | HR 97 | Wt 140.0 lb

## 2023-07-27 DIAGNOSIS — Z7984 Long term (current) use of oral hypoglycemic drugs: Secondary | ICD-10-CM | POA: Insufficient documentation

## 2023-07-27 DIAGNOSIS — F40240 Claustrophobia: Secondary | ICD-10-CM | POA: Insufficient documentation

## 2023-07-27 DIAGNOSIS — I428 Other cardiomyopathies: Secondary | ICD-10-CM | POA: Diagnosis not present

## 2023-07-27 DIAGNOSIS — K625 Hemorrhage of anus and rectum: Secondary | ICD-10-CM | POA: Diagnosis not present

## 2023-07-27 DIAGNOSIS — I5032 Chronic diastolic (congestive) heart failure: Secondary | ICD-10-CM

## 2023-07-27 DIAGNOSIS — I2510 Atherosclerotic heart disease of native coronary artery without angina pectoris: Secondary | ICD-10-CM | POA: Insufficient documentation

## 2023-07-27 DIAGNOSIS — I5040 Unspecified combined systolic (congestive) and diastolic (congestive) heart failure: Secondary | ICD-10-CM | POA: Diagnosis not present

## 2023-07-27 DIAGNOSIS — I3139 Other pericardial effusion (noninflammatory): Secondary | ICD-10-CM | POA: Insufficient documentation

## 2023-07-27 DIAGNOSIS — I130 Hypertensive heart and chronic kidney disease with heart failure and stage 1 through stage 4 chronic kidney disease, or unspecified chronic kidney disease: Secondary | ICD-10-CM | POA: Insufficient documentation

## 2023-07-27 DIAGNOSIS — I5082 Biventricular heart failure: Secondary | ICD-10-CM | POA: Diagnosis not present

## 2023-07-27 DIAGNOSIS — M7989 Other specified soft tissue disorders: Secondary | ICD-10-CM | POA: Insufficient documentation

## 2023-07-27 DIAGNOSIS — Z79899 Other long term (current) drug therapy: Secondary | ICD-10-CM | POA: Diagnosis not present

## 2023-07-27 DIAGNOSIS — R609 Edema, unspecified: Secondary | ICD-10-CM

## 2023-07-27 DIAGNOSIS — N1832 Chronic kidney disease, stage 3b: Secondary | ICD-10-CM | POA: Insufficient documentation

## 2023-07-27 LAB — BASIC METABOLIC PANEL
Anion gap: 8 (ref 5–15)
BUN: 18 mg/dL (ref 8–23)
CO2: 26 mmol/L (ref 22–32)
Calcium: 9.5 mg/dL (ref 8.9–10.3)
Chloride: 105 mmol/L (ref 98–111)
Creatinine, Ser: 1.11 mg/dL — ABNORMAL HIGH (ref 0.44–1.00)
GFR, Estimated: 54 mL/min — ABNORMAL LOW (ref >60)
Glucose, Bld: 87 mg/dL (ref 70–99)
Potassium: 3.7 mmol/L (ref 3.5–5.1)
Sodium: 139 mmol/L (ref 135–145)

## 2023-07-27 LAB — URIC ACID: Uric Acid, Serum: 4.8 mg/dL (ref 2.5–7.1)

## 2023-07-27 LAB — D-DIMER, QUANTITATIVE: D-Dimer, Quant: 0.92 ug{FEU}/mL — ABNORMAL HIGH (ref 0.00–0.50)

## 2023-07-27 LAB — BRAIN NATRIURETIC PEPTIDE: B Natriuretic Peptide: 74.9 pg/mL (ref 0.0–100.0)

## 2023-07-27 NOTE — Telephone Encounter (Signed)
-----   Message from [place] sent at 07/27/2023 11:51 AM EST ----- BNP (fluid marker) is normal as well as uric acid. Do not think hand and foot swelling are due to HF nor gout.  D-dimer is elevated. Need to r/o blood clots.   Please order STAT Left upper extremity venous and arterial doppler study and STAT Right lower extremity venous and arterial study. See if she can get done at [facility], closer to home.

## 2023-07-27 NOTE — Patient Instructions (Addendum)
EKG done today.   RedsClip done today.   Labs done today. We will contact you only if your labs are abnormal.  No medication changes were made. Please continue all current medications as prescribed.  Your physician recommends that you schedule a follow-up appointment in: 4-6 months with our NP/PA Clinic. Please contact our office in March 2025 to schedule an appointment.   If you have any questions or concerns before your next appointment please send [place] a message through [place] or call our office at [phone].    TO LEAVE A MESSAGE FOR THE NURSE SELECT OPTION 2, PLEASE LEAVE A MESSAGE INCLUDING: YOUR NAME DATE OF BIRTH CALL BACK NUMBER REASON FOR CALL**this is important as we prioritize the call backs  YOU WILL RECEIVE A CALL BACK THE SAME DAY AS LONG AS YOU CALL BEFORE 4:00 PM   Do the following things EVERYDAY: Weigh yourself in the morning before breakfast. Write it down and keep it in a log. Take your medicines as prescribed Eat low salt foods--Limit salt (sodium) to 2000 mg per day.  Stay as active as you can everyday Limit all fluids for the day to less than 2 liters   At the [facility], you and your health needs are our priority. As part of our continuing mission to provide you with exceptional heart care, we have created designated Provider Care Teams. These Care Teams include your primary Cardiologist (physician) and Advanced Practice Providers (APPs- Physician Assistants and Nurse Practitioners) who all work together to provide you with the care you need, when you need it.   You may see any of the following providers on your designated Care Team at your next follow up: Dr Arvilla Meres Dr Marca Ancona Dr. Marcos Eke, NP Robbie Lis, [place] [facility] [place], [place] Brynda Peon, NP Karle Plumber, PharmD   Please be sure to bring in all your medications bottles to every appointment.    Thank you for choosing  [facility]

## 2023-07-27 NOTE — Telephone Encounter (Signed)
Patient advised and verbalized understanding, patient scheduled tomorrow at [facility] @ 1:15pm  Orders Placed This Encounter  Procedures   US Venous Img Lower Unilateral Right (DVT)    Standing Status:   Future    Expected Date:   07/28/2023    Expiration Date:   07/26/2024    Reason for Exam (SYMPTOM  OR DIAGNOSIS REQUIRED):   swelling    Preferred imaging location?:   [facility]    Release to patient:   Immediate   US Venous Img Upper Uni Left (DVT)    Standing Status:   Future    Expected Date:   07/28/2023    Expiration Date:   07/26/2024    Reason for Exam (SYMPTOM  OR DIAGNOSIS REQUIRED):   swelling    Preferred imaging location?:   [facility]    Release to patient:   Immediate   US ARTERIAL LOWER EXTREMITY DUPLEX RIGHT(NON-ABI)    Standing Status:   Future    Expected Date:   07/28/2023    Expiration Date:   07/26/2024    Reason for Exam (SYMPTOM  OR DIAGNOSIS REQUIRED):   swelling    Preferred imaging location?:   [facility]    Release to patient:   Immediate   [place] UPPER EXTREMITY DUPLEX LEFT (NON-WBI)    Standing Status:   Future    Expected Date:   07/28/2023    Expiration Date:   07/26/2024    Reason for Exam (SYMPTOM  OR DIAGNOSIS REQUIRED):   swelling    Preferred imaging location?:   [facility]

## 2023-07-27 NOTE — Progress Notes (Signed)
ReDS Vest / Clip - 07/27/23 0900       ReDS Vest / Clip   Station Marker A    Ruler Value 26    ReDS Value Range Low volume    ReDS Actual Value 27

## 2023-07-28 ENCOUNTER — Ambulatory Visit (HOSPITAL_COMMUNITY)
Admission: RE | Admit: 2023-07-28 | Discharge: 2023-07-28 | Disposition: A | Discharge status: Home / Self Care | Payer: PPO | Source: Ambulatory Visit | Attending: Cardiology | Admitting: Cardiology

## 2023-07-28 DIAGNOSIS — R600 Localized edema: Secondary | ICD-10-CM | POA: Diagnosis not present

## 2023-07-28 DIAGNOSIS — R609 Edema, unspecified: Secondary | ICD-10-CM | POA: Diagnosis not present

## 2023-07-28 DIAGNOSIS — M79602 Pain in left arm: Secondary | ICD-10-CM | POA: Diagnosis not present

## 2023-07-28 DIAGNOSIS — I10 Essential (primary) hypertension: Secondary | ICD-10-CM | POA: Diagnosis not present

## 2023-08-11 ENCOUNTER — Emergency Department (HOSPITAL_COMMUNITY): Payer: PPO

## 2023-08-11 ENCOUNTER — Other Ambulatory Visit: Payer: Self-pay

## 2023-08-11 ENCOUNTER — Inpatient Hospital Stay (HOSPITAL_COMMUNITY): Payer: PPO

## 2023-08-11 ENCOUNTER — Encounter (HOSPITAL_COMMUNITY): Payer: Self-pay

## 2023-08-11 ENCOUNTER — Inpatient Hospital Stay (HOSPITAL_COMMUNITY)
Admission: EM | Admit: 2023-08-11 | Discharge: 2023-08-16 | DRG: 554 | Disposition: A | Discharge status: Home / Self Care | Payer: PPO | Attending: Internal Medicine | Admitting: Internal Medicine

## 2023-08-11 DIAGNOSIS — Z79899 Other long term (current) drug therapy: Secondary | ICD-10-CM

## 2023-08-11 DIAGNOSIS — M6284 Sarcopenia: Secondary | ICD-10-CM | POA: Diagnosis present

## 2023-08-11 DIAGNOSIS — M25511 Pain in right shoulder: Secondary | ICD-10-CM | POA: Diagnosis not present

## 2023-08-11 DIAGNOSIS — J029 Acute pharyngitis, unspecified: Secondary | ICD-10-CM | POA: Diagnosis present

## 2023-08-11 DIAGNOSIS — I10 Essential (primary) hypertension: Secondary | ICD-10-CM | POA: Diagnosis not present

## 2023-08-11 DIAGNOSIS — I428 Other cardiomyopathies: Secondary | ICD-10-CM | POA: Diagnosis not present

## 2023-08-11 DIAGNOSIS — I739 Peripheral vascular disease, unspecified: Secondary | ICD-10-CM | POA: Diagnosis not present

## 2023-08-11 DIAGNOSIS — I130 Hypertensive heart and chronic kidney disease with heart failure and stage 1 through stage 4 chronic kidney disease, or unspecified chronic kidney disease: Principal | ICD-10-CM | POA: Diagnosis present

## 2023-08-11 DIAGNOSIS — M79602 Pain in left arm: Secondary | ICD-10-CM

## 2023-08-11 DIAGNOSIS — K219 Gastro-esophageal reflux disease without esophagitis: Secondary | ICD-10-CM | POA: Diagnosis present

## 2023-08-11 DIAGNOSIS — M79604 Pain in right leg: Principal | ICD-10-CM

## 2023-08-11 DIAGNOSIS — R7401 Elevation of levels of liver transaminase levels: Secondary | ICD-10-CM | POA: Diagnosis not present

## 2023-08-11 DIAGNOSIS — M25452 Effusion, left hip: Secondary | ICD-10-CM | POA: Diagnosis not present

## 2023-08-11 DIAGNOSIS — M25572 Pain in left ankle and joints of left foot: Secondary | ICD-10-CM | POA: Diagnosis present

## 2023-08-11 DIAGNOSIS — I5032 Chronic diastolic (congestive) heart failure: Secondary | ICD-10-CM

## 2023-08-11 DIAGNOSIS — M87051 Idiopathic aseptic necrosis of right femur: Secondary | ICD-10-CM | POA: Diagnosis not present

## 2023-08-11 DIAGNOSIS — I509 Heart failure, unspecified: Secondary | ICD-10-CM | POA: Diagnosis not present

## 2023-08-11 DIAGNOSIS — Z96651 Presence of right artificial knee joint: Secondary | ICD-10-CM | POA: Diagnosis present

## 2023-08-11 DIAGNOSIS — I2510 Atherosclerotic heart disease of native coronary artery without angina pectoris: Secondary | ICD-10-CM | POA: Diagnosis present

## 2023-08-11 DIAGNOSIS — K589 Irritable bowel syndrome without diarrhea: Secondary | ICD-10-CM | POA: Diagnosis present

## 2023-08-11 DIAGNOSIS — Z8679 Personal history of other diseases of the circulatory system: Secondary | ICD-10-CM

## 2023-08-11 DIAGNOSIS — Q211 Atrial septal defect, unspecified: Secondary | ICD-10-CM

## 2023-08-11 DIAGNOSIS — G8929 Other chronic pain: Secondary | ICD-10-CM | POA: Diagnosis not present

## 2023-08-11 DIAGNOSIS — M87851 Other osteonecrosis, right femur: Secondary | ICD-10-CM | POA: Diagnosis not present

## 2023-08-11 DIAGNOSIS — E785 Hyperlipidemia, unspecified: Secondary | ICD-10-CM | POA: Diagnosis not present

## 2023-08-11 DIAGNOSIS — M79673 Pain in unspecified foot: Secondary | ICD-10-CM | POA: Diagnosis not present

## 2023-08-11 DIAGNOSIS — M87059 Idiopathic aseptic necrosis of unspecified femur: Secondary | ICD-10-CM | POA: Diagnosis present

## 2023-08-11 DIAGNOSIS — Z1152 Encounter for screening for COVID-19: Secondary | ICD-10-CM

## 2023-08-11 DIAGNOSIS — N183 Chronic kidney disease, stage 3 unspecified: Secondary | ICD-10-CM

## 2023-08-11 DIAGNOSIS — M25512 Pain in left shoulder: Secondary | ICD-10-CM | POA: Diagnosis present

## 2023-08-11 DIAGNOSIS — R609 Edema, unspecified: Secondary | ICD-10-CM | POA: Diagnosis not present

## 2023-08-11 DIAGNOSIS — D700 Congenital agranulocytosis: Secondary | ICD-10-CM

## 2023-08-11 DIAGNOSIS — Z7984 Long term (current) use of oral hypoglycemic drugs: Secondary | ICD-10-CM

## 2023-08-11 DIAGNOSIS — M064 Inflammatory polyarthropathy: Secondary | ICD-10-CM | POA: Diagnosis not present

## 2023-08-11 DIAGNOSIS — Z885 Allergy status to narcotic agent status: Secondary | ICD-10-CM

## 2023-08-11 DIAGNOSIS — Z9849 Cataract extraction status, unspecified eye: Secondary | ICD-10-CM

## 2023-08-11 DIAGNOSIS — R000 Tachycardia, unspecified: Secondary | ICD-10-CM | POA: Diagnosis present

## 2023-08-11 DIAGNOSIS — E039 Hypothyroidism, unspecified: Secondary | ICD-10-CM | POA: Diagnosis not present

## 2023-08-11 DIAGNOSIS — M25522 Pain in left elbow: Secondary | ICD-10-CM | POA: Diagnosis not present

## 2023-08-11 DIAGNOSIS — N182 Chronic kidney disease, stage 2 (mild): Secondary | ICD-10-CM | POA: Diagnosis not present

## 2023-08-11 DIAGNOSIS — F411 Generalized anxiety disorder: Secondary | ICD-10-CM | POA: Diagnosis present

## 2023-08-11 DIAGNOSIS — M87052 Idiopathic aseptic necrosis of left femur: Secondary | ICD-10-CM | POA: Diagnosis not present

## 2023-08-11 DIAGNOSIS — Z85828 Personal history of other malignant neoplasm of skin: Secondary | ICD-10-CM | POA: Diagnosis not present

## 2023-08-11 DIAGNOSIS — M109 Gout, unspecified: Secondary | ICD-10-CM | POA: Diagnosis not present

## 2023-08-11 DIAGNOSIS — R059 Cough, unspecified: Secondary | ICD-10-CM | POA: Diagnosis not present

## 2023-08-11 DIAGNOSIS — M19012 Primary osteoarthritis, left shoulder: Secondary | ICD-10-CM | POA: Diagnosis not present

## 2023-08-11 DIAGNOSIS — N189 Chronic kidney disease, unspecified: Secondary | ICD-10-CM | POA: Diagnosis not present

## 2023-08-11 DIAGNOSIS — E7849 Other hyperlipidemia: Secondary | ICD-10-CM | POA: Diagnosis not present

## 2023-08-11 DIAGNOSIS — Z79890 Hormone replacement therapy (postmenopausal): Secondary | ICD-10-CM

## 2023-08-11 DIAGNOSIS — M25451 Effusion, right hip: Secondary | ICD-10-CM | POA: Diagnosis not present

## 2023-08-11 DIAGNOSIS — M25571 Pain in right ankle and joints of right foot: Secondary | ICD-10-CM | POA: Diagnosis present

## 2023-08-11 DIAGNOSIS — M79632 Pain in left forearm: Secondary | ICD-10-CM | POA: Diagnosis not present

## 2023-08-11 DIAGNOSIS — R748 Abnormal levels of other serum enzymes: Secondary | ICD-10-CM | POA: Diagnosis not present

## 2023-08-11 DIAGNOSIS — M25551 Pain in right hip: Secondary | ICD-10-CM | POA: Diagnosis not present

## 2023-08-11 DIAGNOSIS — M160 Bilateral primary osteoarthritis of hip: Secondary | ICD-10-CM | POA: Diagnosis not present

## 2023-08-11 DIAGNOSIS — M75112 Incomplete rotator cuff tear or rupture of left shoulder, not specified as traumatic: Secondary | ICD-10-CM | POA: Diagnosis present

## 2023-08-11 LAB — RESP PANEL BY RT-PCR (RSV, FLU A&B, COVID)  RVPGX2
Influenza A by PCR: NEGATIVE
Influenza B by PCR: NEGATIVE
Resp Syncytial Virus by PCR: NEGATIVE
SARS Coronavirus 2 by RT PCR: NEGATIVE

## 2023-08-11 LAB — CBC
HCT: 38.4 % (ref 36.0–46.0)
Hemoglobin: 12.4 g/dL (ref 12.0–15.0)
MCH: 29.7 pg (ref 26.0–34.0)
MCHC: 32.3 g/dL (ref 30.0–36.0)
MCV: 92.1 fL (ref 80.0–100.0)
Platelets: 236 10*3/uL (ref 150–400)
RBC: 4.17 MIL/uL (ref 3.87–5.11)
RDW: 14.6 % (ref 11.5–15.5)
WBC: 6.3 10*3/uL (ref 4.0–10.5)
nRBC: 0 % (ref 0.0–0.2)

## 2023-08-11 LAB — HEPATITIS PANEL, ACUTE
HCV Ab: NONREACTIVE
Hep A IgM: NONREACTIVE
Hep B C IgM: NONREACTIVE
Hepatitis B Surface Ag: NONREACTIVE

## 2023-08-11 LAB — COMPREHENSIVE METABOLIC PANEL
ALT: 50 U/L — ABNORMAL HIGH (ref 0–44)
AST: 42 U/L — ABNORMAL HIGH (ref 15–41)
Albumin: 3 g/dL — ABNORMAL LOW (ref 3.5–5.0)
Alkaline Phosphatase: 169 U/L — ABNORMAL HIGH (ref 38–126)
Anion gap: 10 (ref 5–15)
BUN: 17 mg/dL (ref 8–23)
CO2: 23 mmol/L (ref 22–32)
Calcium: 9.1 mg/dL (ref 8.9–10.3)
Chloride: 105 mmol/L (ref 98–111)
Creatinine, Ser: 0.76 mg/dL (ref 0.44–1.00)
GFR, Estimated: >60 mL/min (ref >60)
Glucose, Bld: 212 mg/dL — ABNORMAL HIGH (ref 70–99)
Potassium: 3.8 mmol/L (ref 3.5–5.1)
Sodium: 138 mmol/L (ref 135–145)
Total Bilirubin: 0.7 mg/dL (ref 0.0–1.2)
Total Protein: 7.2 g/dL (ref 6.5–8.1)

## 2023-08-11 LAB — BRAIN NATRIURETIC PEPTIDE: B Natriuretic Peptide: 88.9 pg/mL (ref 0.0–100.0)

## 2023-08-11 LAB — URIC ACID: Uric Acid, Serum: 4.4 mg/dL (ref 2.5–7.1)

## 2023-08-11 LAB — C-REACTIVE PROTEIN: CRP: 7.9 mg/dL — ABNORMAL HIGH (ref <1.0)

## 2023-08-11 LAB — SEDIMENTATION RATE: Sed Rate: 76 mm/h — ABNORMAL HIGH (ref 0–22)

## 2023-08-11 MED ORDER — FUROSEMIDE 20 MG PO TABS
20.0000 mg | ORAL_TABLET | ORAL | Status: DC
  Administered 2023-08-14: 20 mg via ORAL
  Filled 2023-08-11: qty 1

## 2023-08-11 MED ORDER — SENNOSIDES-DOCUSATE SODIUM 8.6-50 MG PO TABS
1.0000 | ORAL_TABLET | Freq: Every evening | ORAL | Status: DC | PRN
  Administered 2023-08-13: 1 via ORAL
  Filled 2023-08-11: qty 1

## 2023-08-11 MED ORDER — SODIUM CHLORIDE 0.9 % IV SOLN: 250.0000 mL | INTRAVENOUS | Status: AC | PRN

## 2023-08-11 MED ORDER — PHENOL 1.4 % MT LIQD: 1.0000 | OROMUCOSAL | Status: DC | PRN

## 2023-08-11 MED ORDER — ACETAMINOPHEN 325 MG PO TABS
650.0000 mg | ORAL_TABLET | Freq: Four times a day (QID) | ORAL | Status: DC | PRN
  Administered 2023-08-14: 650 mg via ORAL
  Filled 2023-08-11: qty 2

## 2023-08-11 MED ORDER — OXYCODONE HCL 5 MG PO TABS
10.0000 mg | ORAL_TABLET | Freq: Four times a day (QID) | ORAL | Status: DC | PRN
  Administered 2023-08-12 – 2023-08-16 (×11): 10 mg via ORAL
  Filled 2023-08-11 (×11): qty 2

## 2023-08-11 MED ORDER — SPIRONOLACTONE 25 MG PO TABS
25.0000 mg | ORAL_TABLET | Freq: Every day | ORAL | Status: DC
  Administered 2023-08-12 – 2023-08-16 (×5): 25 mg via ORAL
  Filled 2023-08-11 (×5): qty 1

## 2023-08-11 MED ORDER — EMPAGLIFLOZIN 10 MG PO TABS
10.0000 mg | ORAL_TABLET | Freq: Every day | ORAL | Status: DC
  Administered 2023-08-12 – 2023-08-16 (×5): 10 mg via ORAL
  Filled 2023-08-11 (×5): qty 1

## 2023-08-11 MED ORDER — ONDANSETRON HCL 4 MG/2ML IJ SOLN
4.0000 mg | Freq: Four times a day (QID) | INTRAMUSCULAR | Status: DC | PRN
  Administered 2023-08-12: 4 mg via INTRAVENOUS
  Filled 2023-08-11: qty 2

## 2023-08-11 MED ORDER — SODIUM CHLORIDE 0.9% FLUSH: 3.0000 mL | INTRAVENOUS | Status: DC | PRN

## 2023-08-11 MED ORDER — ONDANSETRON HCL 4 MG PO TABS: 4.0000 mg | ORAL_TABLET | Freq: Four times a day (QID) | ORAL | Status: DC | PRN

## 2023-08-11 MED ORDER — MENTHOL 3 MG MT LOZG: 1.0000 | LOZENGE | OROMUCOSAL | Status: DC | PRN

## 2023-08-11 MED ORDER — HYDROMORPHONE HCL 1 MG/ML IJ SOLN
1.0000 mg | Freq: Once | INTRAMUSCULAR | Status: AC
  Administered 2023-08-11: 1 mg via INTRAVENOUS
  Filled 2023-08-11: qty 1

## 2023-08-11 MED ORDER — LOSARTAN POTASSIUM 50 MG PO TABS
25.0000 mg | ORAL_TABLET | Freq: Every day | ORAL | Status: DC
  Administered 2023-08-12 – 2023-08-16 (×5): 25 mg via ORAL
  Filled 2023-08-11 (×5): qty 1

## 2023-08-11 MED ORDER — METOPROLOL SUCCINATE ER 50 MG PO TB24
50.0000 mg | ORAL_TABLET | Freq: Every day | ORAL | Status: DC
  Administered 2023-08-12 – 2023-08-16 (×5): 50 mg via ORAL
  Filled 2023-08-11 (×5): qty 1

## 2023-08-11 MED ORDER — BENZONATATE 100 MG PO CAPS: 100.0000 mg | ORAL_CAPSULE | Freq: Two times a day (BID) | ORAL | Status: DC | PRN

## 2023-08-11 MED ORDER — ROSUVASTATIN CALCIUM 5 MG PO TABS
10.0000 mg | ORAL_TABLET | Freq: Every day | ORAL | Status: DC
Start: 2023-08-11 — End: 2023-08-16
  Administered 2023-08-12 – 2023-08-16 (×6): 10 mg via ORAL
  Filled 2023-08-11 (×6): qty 2

## 2023-08-11 MED ORDER — SODIUM CHLORIDE 0.9% FLUSH
3.0000 mL | Freq: Two times a day (BID) | INTRAVENOUS | Status: DC
  Administered 2023-08-12 – 2023-08-15 (×8): 3 mL via INTRAVENOUS

## 2023-08-11 MED ORDER — ALLOPURINOL 100 MG PO TABS
100.0000 mg | ORAL_TABLET | Freq: Every day | ORAL | Status: DC
Start: 2023-08-12 — End: 2023-08-16
  Administered 2023-08-12 – 2023-08-16 (×5): 100 mg via ORAL
  Filled 2023-08-11 (×5): qty 1

## 2023-08-11 MED ORDER — LEVOTHYROXINE SODIUM 88 MCG PO TABS
88.0000 ug | ORAL_TABLET | Freq: Every day | ORAL | Status: DC
Start: 2023-08-12 — End: 2023-08-16
  Administered 2023-08-12 – 2023-08-16 (×5): 88 ug via ORAL
  Filled 2023-08-11 (×5): qty 1

## 2023-08-11 MED ORDER — HEPARIN SODIUM (PORCINE) 5000 UNIT/ML IJ SOLN
5000.0000 [IU] | Freq: Three times a day (TID) | INTRAMUSCULAR | Status: DC
  Administered 2023-08-12 – 2023-08-16 (×14): 5000 [IU] via SUBCUTANEOUS
  Filled 2023-08-11 (×13): qty 1

## 2023-08-11 MED ORDER — ACETAMINOPHEN 650 MG RE SUPP: 650.0000 mg | Freq: Four times a day (QID) | RECTAL | Status: DC | PRN

## 2023-08-11 MED ORDER — ACETAMINOPHEN 500 MG PO TABS
1000.0000 mg | ORAL_TABLET | Freq: Once | ORAL | Status: AC
  Administered 2023-08-11: 1000 mg via ORAL
  Filled 2023-08-11: qty 2

## 2023-08-11 MED ORDER — OXYCODONE HCL 5 MG PO TABS: 5.0000 mg | ORAL_TABLET | Freq: Four times a day (QID) | ORAL | Status: DC | PRN

## 2023-08-11 MED ORDER — KETOROLAC TROMETHAMINE 15 MG/ML IJ SOLN
15.0000 mg | Freq: Once | INTRAMUSCULAR | Status: AC
  Administered 2023-08-11: 15 mg via INTRAVENOUS
  Filled 2023-08-11: qty 1

## 2023-08-11 MED ORDER — ONDANSETRON HCL 4 MG/2ML IJ SOLN
4.0000 mg | Freq: Once | INTRAMUSCULAR | Status: AC
Start: 2023-08-11 — End: 2023-08-11
  Administered 2023-08-11: 4 mg via INTRAVENOUS
  Filled 2023-08-11: qty 2

## 2023-08-11 NOTE — ED Notes (Signed)
 Verbal consent for mse

## 2023-08-11 NOTE — ED Triage Notes (Signed)
 Pt c/o L arm pain x 3 weeks; ultrasound last Friday L arm and right leg , negative; family states no movement to L arm, dragging R leg; also states R foot/lower leg red/ purple; mottling noted to bilateral feet, bilateral pedal pulses palpable, R weaker than left; denies weakness to R arm; denies sob, denies cp; hx CHF; also endorses non productive cough

## 2023-08-11 NOTE — ED Provider Triage Note (Signed)
 Emergency Medicine Provider Triage Evaluation Note  Kathleen Velazquez , a 68 y.o. female  was evaluated in triage.  Pt complains of BLE edema, left shoulder pain, right hip pain.  Review of Systems  Positive: Left shoulder pain, right hip pain, cough, BLE edema  Negative: Chest pain, shortness of breath, abdominal pain  Physical Exam  BP (!) 140/85   Pulse (!) 116   Temp 98.5 F (36.9 C) (Oral)   Resp 18   SpO2 98%  Gen:   Awake, no distress   Resp:  Normal effort  MSK:   Moves lower extremity and right upper extremity without difficulty.  Will not move left upper extremity secondary to pain.  Localizes pain over left shoulder and down arm. Other:  Bilateral extremity edema, worse on the right side.  Normal respiratory effort in no acute distress.  Medical Decision Making  Medically screening exam initiated at 12:23 PM.  Appropriate orders placed.  Kathleen Velazquez was informed that the remainder of the evaluation will be completed by another provider, this initial triage assessment does not replace that evaluation, and the importance of remaining in the ED until their evaluation is complete.  Reporting to emergency room with left shoulder pain that has been ongoing for a month.  Daughter in room reports orthopedics thinks this is frozen shoulder.  Patient is also having right hip pain.  Denies any recent trauma injury or fall.  Patient and daughter also reporting increased swelling over the past few months and generalized weakness.  On exam no ataxia alert oriented answering questions appropriately.  Patient has sensation of right and left extremity equal bilaterally however strength of left shoulder decreased.  Daughter reports this is her baseline x 3 weeks.  Strong radial pulse.   Kathleen Warren SAILOR, PA-C 08/11/23 1226

## 2023-08-11 NOTE — ED Notes (Signed)
 Patient transported to MRI

## 2023-08-11 NOTE — H&P (Addendum)
 History and Physical    Kathleen Velazquez FMW:[id number] DOB: 11-19-1955 DOA: 08/11/2023  PCP: Orpha Yancey LABOR, MD   Patient coming from: Home   Chief Complaint: Left arm and right-sided leg pain for for 3 to 4 months with increasing severity for 3 weeks  ED TRIAGE note:  Pt c/o L arm pain x 3 weeks; ultrasound last Friday L arm and right leg , negative; family states no movement to L arm, dragging R leg; also states R foot/lower leg red/ purple; mottling noted to bilateral feet, bilateral pedal pulses palpable, R weaker than left; denies weakness to R arm; denies sob, denies cp; hx CHF; also endorses non productive cough      HPI:  Kathleen Velazquez is a 68 y.o. female with medical history significant of chronic sinus tachycardia, essential hypertension, diastolic heart failure preserved EF, generalized anxiety disorder, chronic pericardial effusion, CAD, CKD stage II, distal left upper extremity and right lower extremity swelling which is chronic in nature presented to emergency department complaining of left-sided arm pain and right-sided lower extremity pain.v Symptoms have been going on for almost a month now. Denies any trauma. States the pain in her left arm starts in her shoulder and radiates to her fingers. Feels like a sharp burning pain its constant. Denies any pain radiating in her chest and also denies shortness of breath. States the pain in her right leg started around the same time. Starts in the right hip and extends all the way down to the toes. States she had an ultrasound of the left arm and right leg and both studies were negative on December 20. Not take anything for pain today. Denies numbness or weakness in her extremities. States she is reluctant to move her arm and leg because of the pain.   Due to my evaluation at the bedside patient reported that he has initially right-sided shoulder pain and history of right-sided frozen shoulder pain has been ongoing for last 6 months and  then it radiated to the left-sided shoulder and currently having right-sided  hip pain that goes down to right-sided lower extremity up to her right sided lower feet.  Patient denies any fall and trauma patient denies any lower back pain.  Denies any fever, chill, and gout flare.  Due to the chronic pain of the left shoulder and right lower extremity patient had outpatient workup including. Bilateral lower extremities Doppler no evidence of occlusion. Right lower extremity DVT negative. Left-sided upper extremity DVT study negative. X-ray of the right shoulder unremarkable.  ED Course:  At presentation to ED patient is tachycardic heart rate 116 otherwise hemodynamically stable. Normal BNP 88.9. CBC unremarkable. CMP unremarkable except elevated blood glucose 212, transaminitis,  X-ray of the right hip bilateral femoral head avascular necrosis.characterized by bone destruction/fragmentation and subarticular lucency in the bilateral femoral heads.  Chest x-ray unremarkable.  Pending left sided shoulder x-ray finding.  In the ED patient has been treated with Dilaudid , Toradol  and Zofran .  As patient has severe pain of the left-sided shoulder and right-sided hip area ED physician has been consulted [facility] for admission for pain management.  In the ED patient has been evaluated by orthopedic surgeon Dr. Celena recommended admit for pain management and will evaluate patient in the a.m.    Significant labs in the ED: Lab Orders         Resp panel by RT-PCR (RSV, Flu A&B, Covid) Anterior Nasal Swab         Comprehensive metabolic  panel         CBC         Brain natriuretic peptide         Comprehensive metabolic panel         CBC         Uric acid         C-reactive protein         Sedimentation rate         Hemoglobin A1c         Hepatitis panel, acute       Review of Systems:  Review of Systems  Constitutional:  Negative for chills, fever, malaise/fatigue and weight loss.  HENT:   Positive for sore throat.   Respiratory:  Negative for cough and shortness of breath.   Cardiovascular:  Negative for chest pain, palpitations and orthopnea.  Gastrointestinal:  Negative for abdominal pain, heartburn, nausea and vomiting.  Musculoskeletal:  Positive for joint pain. Negative for back pain, falls and myalgias.  Skin:  Negative for itching and rash.  Neurological:  Negative for headaches.  Endo/Heme/Allergies:  Negative for environmental allergies. Does not bruise/bleed easily.  Psychiatric/Behavioral:  The patient is not nervous/anxious.     Past Medical History:  Diagnosis Date   Anxiety    Arthritis    knees,    Ejection fraction    Normal, echo, June, 2013   GERD (gastroesophageal reflux disease)    Headache(784.0)    hx of migraines    History of hiatal hernia    Hypertension    Irritable bowel syndrome (IBS)    PONV (postoperative nausea and vomiting)    Sinus tachycardia    Persistent sinus tachycardia, June, 2013 ( prior monitor in 2007 had shown no significant abnormalities.)    Past Surgical History:  Procedure Laterality Date   CHOLECYSTECTOMY     ESOPHAGOGASTRODUODENOSCOPY N/A 08/19/2016   Procedure: ESOPHAGOGASTRODUODENOSCOPY (EGD);  Surgeon: Claudis RAYMOND Rivet, MD;  Location: [facility];  Service: Endoscopy;  Laterality: N/A;  2:50   EYE SURGERY     cataract   HEMORRHOID SURGERY N/A 05/20/2013   Procedure: EXTENSIVE HEMORRHOIDECTOMY;  Surgeon: Oneil DELENA Budge, MD;  Location: [facility];  Service: General;  Laterality: N/A;   JOINT REPLACEMENT     right knee replacement    OTHER SURGICAL HISTORY     left breast biopsy - benign    OTHER SURGICAL HISTORY     hx of skin cancer surgery    RIGHT/LEFT HEART CATH AND CORONARY ANGIOGRAPHY N/A 11/22/2022   Procedure: RIGHT/LEFT HEART CATH AND CORONARY ANGIOGRAPHY;  Surgeon: Verlin Lonni BIRCH, MD;  Location: [facility];  Service: Cardiovascular;  Laterality: N/A;   TOTAL KNEE ARTHROPLASTY   11/28/2011   Procedure: TOTAL KNEE ARTHROPLASTY;  Surgeon: Dempsey LULLA Moan, MD;  Location: [facility];  Service: Orthopedics;  Laterality: Left;   TOTAL KNEE REVISION Right 02/26/2020   Procedure: TOTAL KNEE REVISION;  Surgeon: Moan Dempsey, MD;  Location: [facility];  Service: Orthopedics;  Laterality: Right;    TUBAL LIGATION       reports that she has never smoked. She has never used smokeless tobacco. She reports that she does not currently use alcohol. She reports that she does not use drugs.  Allergies  Allergen Reactions   Codeine Nausea And Vomiting    Family History  Problem Relation Age of Onset   Irritable bowel syndrome Sister     Prior to Admission medications   Medication Sig  Start Date End Date Taking? Authorizing Provider  allopurinol  (ZYLOPRIM ) 100 MG tablet Take 100 mg by mouth daily.    [provider]  empagliflozin  (JARDIANCE ) 10 MG TABS tablet Take 1 tablet (10 mg total) by mouth daily. 12/29/22   Hayes Beckey CROME, NP  furosemide  (LASIX ) 20 MG tablet Take 20 mg by mouth 2 (two) times a week.    [provider]  levothyroxine  (SYNTHROID ) 88 MCG tablet Take 88 mcg by mouth daily before breakfast.    [provider]  losartan  (COZAAR ) 25 MG tablet Take 1 tablet (25 mg total) by mouth daily. 01/24/23   Bensimhon, Toribio SAUNDERS, MD  metoprolol  succinate (TOPROL -XL) 50 MG 24 hr tablet Take 1 tablet (50 mg total) by mouth daily. Take with or immediately following a meal. 12/29/22   Hayes Beckey CROME, NP  rosuvastatin  (CRESTOR ) 10 MG tablet Take 1 tablet (10 mg total) by mouth daily. 11/27/22   Akula, Vijaya, MD  spironolactone  (ALDACTONE ) 25 MG tablet Take 1 tablet (25 mg total) by mouth daily. 01/24/23   Bensimhon, Toribio SAUNDERS, MD     Physical Exam: Vitals:   08/11/23 1159 08/11/23 1700 08/11/23 1714 08/11/23 1816  BP:  (!) 141/82    Pulse: (!) 116 (!) 108    Resp:  19    Temp:    98.1 F (36.7 C)  TempSrc:    Oral  SpO2:  99%    Weight:   63.5 kg    Height:   5' 2 (1.575 m)     Physical Exam Constitutional:      General: She is not in acute distress.    Appearance: She is not ill-appearing.  HENT:     Mouth/Throat:     Mouth: Mucous membranes are moist.  Eyes:     Conjunctiva/sclera: Conjunctivae normal.  Cardiovascular:     Rate and Rhythm: Regular rhythm. Tachycardia present.     Pulses: Normal pulses.     Heart sounds: Normal heart sounds.  Pulmonary:     Effort: Pulmonary effort is normal.     Breath sounds: Normal breath sounds.  Abdominal:     General: Bowel sounds are normal. There is no distension.     Tenderness: There is no abdominal tenderness.  Musculoskeletal:        General: Tenderness present. No swelling, deformity or signs of injury.     Cervical back: Neck supple.     Right lower leg: No edema.     Left lower leg: No edema.     Comments: Left-sided shoulder pain with limited passive movement. Bilateral hip joint pain most on the right side with limited passive movement. Bilateral extremities distal pulses 2+.  Skin:    Findings: Erythema present. No bruising, lesion or rash.  Neurological:     Mental Status: She is alert and oriented to person, place, and time.     Sensory: No sensory deficit.     Motor: No weakness.  Psychiatric:        Mood and Affect: Mood normal.      Labs on Admission: I have personally reviewed following labs and imaging studies  CBC: Recent Labs  Lab 08/11/23 1230  WBC 6.3  HGB 12.4  HCT 38.4  MCV 92.1  PLT 236   Basic Metabolic Panel: Recent Labs  Lab 08/11/23 1230  NA 138  K 3.8  CL 105  CO2 23  GLUCOSE 212*  BUN 17  CREATININE 0.76  CALCIUM  9.1  GFR: Estimated Creatinine Clearance: 58.1 mL/min (by C-G formula based on SCr of 0.76 mg/dL). Liver Function Tests: Recent Labs  Lab 08/11/23 1230  AST 42*  ALT 50*  ALKPHOS 169*  BILITOT 0.7  PROT 7.2  ALBUMIN  3.0*   No results for input(s): LIPASE, AMYLASE in the last 168 hours. No  results for input(s): AMMONIA in the last 168 hours. Coagulation Profile: No results for input(s): INR, PROTIME in the last 168 hours. Cardiac Enzymes: No results for input(s): CKTOTAL, CKMB, CKMBINDEX, TROPONINI, TROPONINIHS in the last 168 hours. BNP (last 3 results) Recent Labs    03/27/23 0951 07/27/23 0934 08/11/23 1230  BNP 122.8* 74.9 88.9   HbA1C: No results for input(s): HGBA1C in the last 72 hours. CBG: No results for input(s): GLUCAP in the last 168 hours. Lipid Profile: No results for input(s): CHOL, HDL, LDLCALC, TRIG, CHOLHDL, LDLDIRECT in the last 72 hours. Thyroid  Function Tests: No results for input(s): TSH, T4TOTAL, FREET4, T3FREE, THYROIDAB in the last 72 hours. Anemia Panel: No results for input(s): VITAMINB12, FOLATE, FERRITIN, TIBC, IRON , RETICCTPCT in the last 72 hours. Urine analysis:    Component Value Date/Time   COLORURINE STRAW (A) 11/23/2022 2055   APPEARANCEUR HAZY (A) 11/23/2022 2055   LABSPEC 1.006 11/23/2022 2055   PHURINE 5.0 11/23/2022 2055   GLUCOSEU NEGATIVE 11/23/2022 2055   HGBUR NEGATIVE 11/23/2022 2055   BILIRUBINUR NEGATIVE 11/23/2022 2055   KETONESUR NEGATIVE 11/23/2022 2055   PROTEINUR NEGATIVE 11/23/2022 2055   UROBILINOGEN 0.2 11/21/2011 1020   NITRITE NEGATIVE 11/23/2022 2055   LEUKOCYTESUR NEGATIVE 11/23/2022 2055    Radiological Exams on Admission: I have personally reviewed images DG Chest 1 View Result Date: 08/11/2023 CLINICAL DATA:  Cough. EXAM: CHEST  1 VIEW COMPARISON:  11/23/2022. FINDINGS: Bilateral lung fields are clear. Bilateral costophrenic angles are clear. Normal cardio-mediastinal silhouette. No acute osseous abnormalities. The soft tissues are within normal limits. IMPRESSION: No active disease. Electronically Signed   By: Ree Molt M.D.   On: 08/11/2023 14:54   DG Hip Unilat W or Wo Pelvis 2-3 Views Right Result Date: 08/11/2023 CLINICAL DATA:  Right  hip pain. EXAM: DG HIP (WITH OR WITHOUT PELVIS) 2-3V RIGHT COMPARISON:  None Available. FINDINGS: No acute fracture or dislocation. 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 Visualized sacral arcuate lines are unremarkable. Unremarkable symphysis pubis. There are mild degenerative changes of bilateral hip joints with joint space narrowing and osteophytosis of the superior acetabulum. No radiopaque foreign bodies. IMPRESSION: *Bilateral femoral head AVN. Electronically Signed   By: Ree Molt M.D.   On: 08/11/2023 14:54     EKG: My personal interpretation of EKG shows: Sinus tachycardia with premature atrial complex heart rate 108.    Assessment/Plan: Principal Problem:   Avascular necrosis of bone of hip (HCC) Active Problems:   Left shoulder pain   Transaminitis   Chronic diastolic CHF (congestive heart failure) (HCC)   Hyperlipidemia   History of CAD (coronary artery disease)   GAD (generalized anxiety disorder)   Hypothyroidism   Avascular necrosis of bone of hip, unspecified laterality (HCC)   Essential hypertension   CKD (chronic kidney disease) stage 2, GFR 60-89 ml/min    Assessment and Plan: Avascular necrosis of the bilateral hip joint Left-sided shoulder pain Chronic bilateral hip pain and left-sided shoulder pain > Patient presenting with complaining of left-sided shoulder pain and bilateral hip pain which has been ongoing  for last 1 month.  Recently had outpatient workup with right-sided lower extremity DVT study, left-sided upper extremity DVT study and right-sided lower extremity ABI index normal.  right sided shoulder x-ray -Patient is hemodynamically stable in the ED.  CBC and CMP unremarkable except CMP showed evidence of transaminitis. - X-ray of the right hip showed bilateral femoral neck avascular necrosis with degenerative change. -Pending  right-sided left shoulder. -Checking uric acid, CRP and ESR level - ED physician consulted orthopedics Dr. Celena who recommended pain control and if patient will be admitted will evaluate patient in the a.m. for further recommendation. - Will follow-up with right-sided shoulder x-ray. -Obtaining MRI of the bilateral hip joints without contrast. - Admitting patient for pain management, PT and OT evaluation. - In the ED patient received Toradol  and Dilaudid . - Consulted PT and OT for evaluation. -Continue pain control with Tylenol  for mild pain and Roxicodone  10 mg every 6 hour as needed for moderate and severe pain. - Will follow-up with orthopedic surgeon in the a.m. for further recommendation.   Transaminitis - CMP showing his initial finding of AST, ALT and ALP level.  Patient denies any right upper quadrant abdominal pain. - Elevated ALP likely secondary to bone origin. - Patient does not have any history of alcohol use. - Checking hepatitis panel.  Sore throat dry cough lower - Patient reported dry cough and sore throat for 1 to 2 weeks.  Respiratory panel unremarkable for COVID, flu, RSV. - Continue Tessalon  andce cepacol as needed  Chronic diastolic heart failure preserved EF 70 to 75% Essential hypertension -Blood pressure is well-controlled.  Continue home medications include losartan  25 mg, Toprol -XL 50 mg, spironolactone  25 mg and Jardiance  10 mg daily.  Chronic sinus tachycardia - Continue Toprol -XL 50 mg daily.  History of CAD -Continue Toprol -XL 50 mg daily and Crestor  10 mg daily  Hyperlipidemia -Continue Crestor   CKD stage II  - Stable renal function.  Continue to monitor urine output and avoid nephrotoxic agents.  Generalized anxiety disorder -Not currently on any home medication  Hypothyroidism -Continue levothyroxine .  Gout -Continue allopurinol  100 mg daily   DVT prophylaxis:  SQ Heparin  Code Status:  Full Code Diet:  Family Communication:    Family was present at bedside, at the time of interview. Opportunity was given to ask question and all questions were answered satisfactorily.  Disposition Plan: Pending PT and OT evaluation.  Orthopedic will evaluate in the a.m for further recommendation Consults: PT, OT and orthopedics Admission status:   Inpatient, Telemetry bed  Severity of Illness: The appropriate patient status for this patient is INPATIENT. Inpatient status is judged to be reasonable and necessary in order to provide the required intensity of service to ensure the patient's safety. The patient's presenting symptoms, physical exam findings, and initial radiographic and laboratory data in the context of their chronic comorbidities is felt to place them at high risk for further clinical deterioration. Furthermore, it is not anticipated that the patient will be medically stable for discharge from the hospital within 2 midnights of admission.   * I certify that at the point of admission it is my clinical judgment that the patient will require inpatient hospital care spanning beyond 2 midnights from the point of admission due to high intensity of service, high risk for further deterioration and high frequency of surveillance required.DEWAINE    Terran Hollenkamp, MD [facility]  How to contact the [facility] Attending or Consulting provider 7A - 7P or covering provider during after hours  7P -7A, for this patient.  Check the care team in [facility] and look for a) attending/consulting [facility] provider listed and b) the [facility] team listed Log into [email or web address] and use [facility] universal password to access. If you do not have the password, please contact the hospital operator. Locate the [facility] provider you are looking for under [facility] and page to a number that you can be directly reached. If you still have difficulty reaching the provider, please page the [facility] (Director on Call) for the Hospitalists listed on amion for  assistance.  08/11/2023, 8:01 PM

## 2023-08-11 NOTE — ED Provider Notes (Signed)
 [place] EMERGENCY DEPARTMENT AT [facility] Provider Note   CSN: [id number] Arrival date & time: 08/11/23  1119     History  No chief complaint on file.  HPI Kathleen Velazquez is a 68 y.o. female with history of nonischemic cardiomyopathy, CHF, SVT presenting for left arm and right leg pain.  Symptoms have been going on for almost a month now.  Denies any trauma.  States the pain in her left arm starts in her shoulder and radiates to her fingers.  Feels like a sharp burning pain its constant.  Denies any pain radiating in her chest and also denies shortness of breath. States the pain in her right leg started around the same time.  Starts in the right hip and extends all the way down to the toes. States she had an ultrasound of the left arm and right leg and both studies were negative on December 20.  Not take anything for pain today.  Denies numbness or weakness in her extremities.  States she is reluctant to move her arm and leg because of the pain.  HPI     Home Medications Prior to Admission medications   Medication Sig Start Date End Date Taking? Authorizing Provider  allopurinol  (ZYLOPRIM ) 100 MG tablet Take 100 mg by mouth daily.    [provider]  empagliflozin  (JARDIANCE ) 10 MG TABS tablet Take 1 tablet (10 mg total) by mouth daily. 12/29/22   Hayes Beckey CROME, NP  furosemide  (LASIX ) 20 MG tablet Take 20 mg by mouth 2 (two) times a week.    [provider]  levothyroxine  (SYNTHROID ) 88 MCG tablet Take 88 mcg by mouth daily before breakfast.    [provider]  losartan  (COZAAR ) 25 MG tablet Take 1 tablet (25 mg total) by mouth daily. 01/24/23   Bensimhon, Toribio JONELLE, MD  metoprolol  succinate (TOPROL -XL) 50 MG 24 hr tablet Take 1 tablet (50 mg total) by mouth daily. Take with or immediately following a meal. 12/29/22   Hayes Beckey CROME, NP  rosuvastatin  (CRESTOR ) 10 MG tablet Take 1 tablet (10 mg total) by mouth daily. 11/27/22   Akula, Vijaya, MD   spironolactone  (ALDACTONE ) 25 MG tablet Take 1 tablet (25 mg total) by mouth daily. 01/24/23   Bensimhon, Toribio JONELLE, MD      Allergies    Codeine    Review of Systems   See HPI for pertinent positives   Physical Exam   Vitals:   08/11/23 1700 08/11/23 1816  BP: (!) 141/82   Pulse: (!) 108   Resp: 19   Temp:  98.1 F (36.7 C)  SpO2: 99%     CONSTITUTIONAL:  well-appearing, NAD NEURO:  Alert and oriented x 3, CN 3-12 grossly intact EYES:  eyes equal and reactive ENT/NECK:  Supple, no stridor  CARDIO:  Regular rate and rhythm, appears well-perfused, radial pulses are 2+ bilaterally, 1+ right sided DP pulse, 2+ left-sided DP pulse, all extremities warm, cap refill is brisk in the toes and the fingers PULM:  No respiratory distress, CTAB GI/GU:  non-distended, soft MSK/SPINE:  No gross deformities, no edema, reluctant to move the left arm but able to move the fingers and lightly grip my hand.  Also reluctant to move the right leg as well due to pain. SKIN:  no rash, atraumatic  *Additional and/or pertinent findings included in MDM below  ED Results / Procedures / Treatments   Labs (all labs ordered are listed, but only abnormal results are  displayed) Labs Reviewed  COMPREHENSIVE METABOLIC PANEL - Abnormal; Notable for the following components:      Result Value   Glucose, Bld 212 (*)    Albumin  3.0 (*)    AST 42 (*)    ALT 50 (*)    Alkaline Phosphatase 169 (*)    All other components within normal limits  RESP PANEL BY RT-PCR (RSV, FLU A&B, COVID)  [id number]  CBC  BRAIN NATRIURETIC PEPTIDE  HEMOGLOBIN A1C  HEPATITIS PANEL, ACUTE  COMPREHENSIVE METABOLIC PANEL  CBC  URIC ACID  C-REACTIVE PROTEIN  SEDIMENTATION RATE    EKG None  Radiology DG Chest 1 View Result Date: 08/11/2023 CLINICAL DATA:  Cough. EXAM: CHEST  1 VIEW COMPARISON:  11/23/2022. FINDINGS: Bilateral lung fields are clear. Bilateral costophrenic angles are clear. Normal cardio-mediastinal silhouette.  No acute osseous abnormalities. The soft tissues are within normal limits. IMPRESSION: No active disease. Electronically Signed   By: Ree Molt M.D.   On: 08/11/2023 14:54   DG Hip Unilat W or Wo Pelvis 2-3 Views Right Result Date: 08/11/2023 CLINICAL DATA:  Right hip pain. EXAM: DG HIP (WITH OR WITHOUT PELVIS) 2-3V RIGHT COMPARISON:  None Available. FINDINGS: No acute fracture or dislocation. 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 Visualized sacral arcuate lines are unremarkable. Unremarkable symphysis pubis. There are mild degenerative changes of bilateral hip joints with joint space narrowing and osteophytosis of the superior acetabulum. No radiopaque foreign bodies. IMPRESSION: *Bilateral femoral head AVN. Electronically Signed   By: Ree Molt M.D.   On: 08/11/2023 14:54    Procedures Procedures    Medications Ordered in ED Medications  allopurinol  (ZYLOPRIM ) tablet 100 mg (has no administration in time range)  furosemide  (LASIX ) tablet 20 mg (has no administration in time range)  losartan  (COZAAR ) tablet 25 mg (has no administration in time range)  metoprolol  succinate (TOPROL -XL) 24 hr tablet 50 mg (has no administration in time range)  rosuvastatin  (CRESTOR ) tablet 10 mg (has no administration in time range)  spironolactone  (ALDACTONE ) tablet 25 mg (has no administration in time range)  empagliflozin  (JARDIANCE ) tablet 10 mg (has no administration in time range)  levothyroxine  (SYNTHROID ) tablet 88 mcg (has no administration in time range)  heparin  injection 5,000 Units (has no administration in time range)  sodium chloride  flush (NS) 0.9 % injection 3 mL (has no administration in time range)  sodium chloride  flush (NS) 0.9 % injection 3 mL (has no administration in time range)  0.9 %  sodium chloride  infusion (has no administration in time range)   acetaminophen  (TYLENOL ) tablet 650 mg (has no administration in time range)    Or  acetaminophen  (TYLENOL ) suppository 650 mg (has no administration in time range)  oxyCODONE  (Oxy IR/ROXICODONE ) immediate release tablet 5 mg (has no administration in time range)  senna-docusate (Senokot-S) tablet 1 tablet (has no administration in time range)  ondansetron  (ZOFRAN ) tablet 4 mg (has no administration in time range)    Or  ondansetron  (ZOFRAN ) injection 4 mg (has no administration in time range)  acetaminophen  (TYLENOL ) tablet 1,000 mg (1,000 mg Oral Given 08/11/23 1723)  ketorolac  (TORADOL ) 15 MG/ML injection 15 mg (15 mg Intravenous Given 08/11/23 1932)  HYDROmorphone  (DILAUDID ) injection 1 mg (1 mg Intravenous Given 08/11/23 1933)  ondansetron  (ZOFRAN ) injection 4 mg (4 mg Intravenous Given 08/11/23 1928)    ED Course/ Medical Decision Making/ A&P Clinical Course as of 08/11/23 1944  Fri Aug 11, 2023  1816 Discussed patient with Dr. Celena.  Recommended pain management at this time and will need Ortho follow-up.  States it would be reasonable to admit her if she is unable to ambulate.  Also mentioned that he would be available in consult tomorrow if need be.  Also requested hemoglobin A1c. [JR]    Clinical Course User Index [JR] Brigitte Soderberg K, PA-C                                 Medical Decision Making Amount and/or Complexity of Data Reviewed Radiology: ordered.  Risk OTC drugs. Prescription drug management. Decision regarding hospitalization.   Initial Impression and Ddx 58 well-appearing female presenting for right leg and left arm pain.  Exam notable for reduced mobility and those extremities secondary to pain.  DDx includes DVT, PAD, fracture dislocation, other. Patient PMH that increases complexity of ED encounter:   history of nonischemic cardiomyopathy, CHF, SVT  Interpretation of Diagnostics I independent reviewed and interpreted the labs as followed: Hyperglycemia, LFTs  elevated  - I independently visualized the following imaging with scope of interpretation limited to determining acute life threatening conditions related to emergency care: X-ray pelvis, which revealed bilateral AVN, no acute findings on chest x-ray, left shoulder x-ray pending  -I personally reviewed interpret EKG which revealed sinus tachycardia  Patient Reassessment and Ultimate Disposition/Management On reassessment, patient stated that she was still in considerable amount of pain.  Treated with Toradol , Dilaudid  and Zofran .  Discussed patient with Dr. Celena of orthopedics.  Advised it would be reasonable to admit for pain control and stated that he would be available to consult on her in the morning.  Vitals remained stable, in no acute distress and well-appearing.  Admitted to hospital service with Dr. Subrina Sundil.   Patient management required discussion with the following services or consulting groups:  Hospitalist Service and Orthopedic Surgery  Complexity of Problems Addressed Acute complicated illness or Injury  Additional Data Reviewed and Analyzed Further history obtained from: Further history from spouse/family member, Past medical history and medications listed in the EMR, and Prior ED visit notes  Patient Encounter Risk Assessment Consideration of hospitalization        Final Clinical Impression(s) / ED Diagnoses Final diagnoses:  Right leg pain  Left arm pain    Rx / DC Orders ED Discharge Orders     None         Lang Norleen POUR, PA-C 08/11/23 1944    Bari Charmaine FALCON, MD 08/15/23 2253

## 2023-08-11 NOTE — Plan of Care (Signed)

## 2023-08-11 NOTE — ED Notes (Signed)
 ED TO INPATIENT HANDOFF REPORT  ED Nurse Name and Phone #: Lorenza [phone]  S Name/Age/Gender Kathleen Velazquez 68 y.o. female Room/Bed: 035C/035C  Code Status   Code Status: Full Code  Home/SNF/Other Home Patient oriented to: self, place, time, and situation Is this baseline? Yes   Triage Complete: Triage complete  Chief Complaint Avascular necrosis of bone of hip, unspecified laterality (HCC) [M87.059]  Triage Note Pt c/o L arm pain x 3 weeks; ultrasound last Friday L arm and right leg , negative; family states no movement to L arm, dragging R leg; also states R foot/lower leg red/ purple; mottling noted to bilateral feet, bilateral pedal pulses palpable, R weaker than left; denies weakness to R arm; denies sob, denies cp; hx CHF; also endorses non productive cough   Allergies Allergies  Allergen Reactions   Codeine Nausea And Vomiting    Level of Care/Admitting Diagnosis ED Disposition     ED Disposition  Admit   Condition  --   Comment  Hospital Area: [facility] [[id number]]  Level of Care: Telemetry Medical [104]  May admit patient to [facility] or [facility] if equivalent level of care is available:: No  Covid Evaluation: Asymptomatic - no recent exposure (last 10 days) testing not required  Diagnosis: Avascular necrosis of bone of hip, unspecified laterality [facility]) [[id number]]  Admitting Physician: SUNDIL, SUBRINA [[id number]]  Attending Physician: SUNDIL, SUBRINA [[id number]]  Certification:: I certify this patient will need inpatient services for at least 2 midnights  Expected Medical Readiness: 08/16/2023          B Medical/Surgery History Past Medical History:  Diagnosis Date   Anxiety    Arthritis    knees,    Ejection fraction    Normal, echo, June, 2013   GERD (gastroesophageal reflux disease)    Headache(784.0)    hx of migraines    History of hiatal hernia    Hypertension    Irritable bowel syndrome (IBS)    PONV (postoperative  nausea and vomiting)    Sinus tachycardia    Persistent sinus tachycardia, June, 2013 ( prior monitor in 2007 had shown no significant abnormalities.)   Past Surgical History:  Procedure Laterality Date   CHOLECYSTECTOMY     ESOPHAGOGASTRODUODENOSCOPY N/A 08/19/2016   Procedure: ESOPHAGOGASTRODUODENOSCOPY (EGD);  Surgeon: Claudis RAYMOND Rivet, MD;  Location: [facility];  Service: Endoscopy;  Laterality: N/A;  2:50   EYE SURGERY     cataract   HEMORRHOID SURGERY N/A 05/20/2013   Procedure: EXTENSIVE HEMORRHOIDECTOMY;  Surgeon: Oneil DELENA Budge, MD;  Location: [facility] ORS;  Service: General;  Laterality: N/A;   JOINT REPLACEMENT     right knee replacement    OTHER SURGICAL HISTORY     left breast biopsy - benign    OTHER SURGICAL HISTORY     hx of skin cancer surgery    RIGHT/LEFT HEART CATH AND CORONARY ANGIOGRAPHY N/A 11/22/2022   Procedure: RIGHT/LEFT HEART CATH AND CORONARY ANGIOGRAPHY;  Surgeon: Verlin Lonni BIRCH, MD;  Location: [facility] INVASIVE CV LAB;  Service: Cardiovascular;  Laterality: N/A;   TOTAL KNEE ARTHROPLASTY  11/28/2011   Procedure: TOTAL KNEE ARTHROPLASTY;  Surgeon: Dempsey LULLA Moan, MD;  Location: [facility] ORS;  Service: Orthopedics;  Laterality: Left;   TOTAL KNEE REVISION Right 02/26/2020   Procedure: TOTAL KNEE REVISION;  Surgeon: Moan Dempsey, MD;  Location: [facility] ORS;  Service: Orthopedics;  Laterality: Right;    TUBAL LIGATION       A IV  Location/Drains/Wounds Patient Lines/Drains/Airways Status     Active Line/Drains/Airways     Name Placement date Placement time Site Days   Peripheral IV 08/11/23 20 G Right Antecubital 08/11/23  1922  Antecubital  less than 1            Intake/Output Last 24 hours No intake or output data in the 24 hours ending 08/11/23 1939  Labs/Imaging Results for orders placed or performed during the hospital encounter of 08/11/23 (from the past 48 hours)  Resp panel by RT-PCR (RSV, Flu A&B, Covid) Anterior Nasal Swab     Status: None    Collection Time: 08/11/23 12:21 PM   Specimen: Anterior Nasal Swab  Result Value Ref Range   SARS Coronavirus 2 by RT PCR NEGATIVE NEGATIVE   Influenza A by PCR NEGATIVE NEGATIVE   Influenza B by PCR NEGATIVE NEGATIVE    Comment: (NOTE) The Xpert Xpress SARS-CoV-2/FLU/RSV plus assay is intended as an aid in the diagnosis of influenza from Nasopharyngeal swab specimens and should not be used as a sole basis for treatment. Nasal washings and aspirates are unacceptable for Xpert Xpress SARS-CoV-2/FLU/RSV testing.  Fact Sheet for Patients: bloggercourse.com  Fact Sheet for Healthcare Providers: seriousbroker.it  This test is not yet approved or cleared by the [place]  FDA and has been authorized for detection and/or diagnosis of SARS-CoV-2 by FDA under an Emergency Use Authorization (EUA). This EUA will remain in effect (meaning this test can be used) for the duration of the COVID-19 declaration under Section 564(b)(1) of the Act, 21 U.S.C. section 360bbb-3(b)(1), unless the authorization is terminated or revoked.     Resp Syncytial Virus by PCR NEGATIVE NEGATIVE    Comment: (NOTE) Fact Sheet for Patients: bloggercourse.com  Fact Sheet for Healthcare Providers: seriousbroker.it  This test is not yet approved or cleared by the [place]  FDA and has been authorized for detection and/or diagnosis of SARS-CoV-2 by FDA under an Emergency Use Authorization (EUA). This EUA will remain in effect (meaning this test can be used) for the duration of the COVID-19 declaration under Section 564(b)(1) of the Act, 21 U.S.C. section 360bbb-3(b)(1), unless the authorization is terminated or revoked.  Performed at [facility], [address], [place], [place] [address]   Comprehensive metabolic panel     Status: Abnormal   Collection Time: 08/11/23 12:30 PM  Result Value Ref  Range   Sodium 138 135 - 145 mmol/L   Potassium 3.8 3.5 - 5.1 mmol/L   Chloride 105 98 - 111 mmol/L   CO2 23 22 - 32 mmol/L   Glucose, Bld 212 (H) 70 - 99 mg/dL    Comment: Glucose reference range applies only to samples taken after fasting for at least 8 hours.   BUN 17 8 - 23 mg/dL   Creatinine, Ser 9.23 0.44 - 1.00 mg/dL   Calcium  9.1 8.9 - 10.3 mg/dL   Total Protein 7.2 6.5 - 8.1 g/dL   Albumin  3.0 (L) 3.5 - 5.0 g/dL   AST 42 (H) 15 - 41 U/L   ALT 50 (H) 0 - 44 U/L   Alkaline Phosphatase 169 (H) 38 - 126 U/L   Total Bilirubin 0.7 0.0 - 1.2 mg/dL   GFR, Estimated >39 >39 mL/min    Comment: (NOTE) Calculated using the CKD-EPI Creatinine Equation (2021)    Anion gap 10 5 - 15    Comment: Performed at [facility], [address] [place], [place] [address]  CBC  Status: None   Collection Time: 08/11/23 12:30 PM  Result Value Ref Range   WBC 6.3 4.0 - 10.5 K/uL   RBC 4.17 3.87 - 5.11 MIL/uL   Hemoglobin 12.4 12.0 - 15.0 g/dL   HCT 61.5 63.9 - 53.9 %   MCV 92.1 80.0 - 100.0 fL   MCH 29.7 26.0 - 34.0 pg   MCHC 32.3 30.0 - 36.0 g/dL   RDW 85.3 88.4 - 84.4 %   Platelets 236 150 - 400 K/uL   nRBC 0.0 0.0 - 0.2 %    Comment: Performed at [facility], [address] [place], [place] [address]  Brain natriuretic peptide     Status: None   Collection Time: 08/11/23 12:30 PM  Result Value Ref Range   B Natriuretic Peptide 88.9 0.0 - 100.0 pg/mL    Comment: Performed at [facility], [address], [place], [place] [address]   DG Chest 1 View Result Date: 08/11/2023 CLINICAL DATA:  Cough. EXAM: CHEST  1 VIEW COMPARISON:  11/23/2022. FINDINGS: Bilateral lung fields are clear. Bilateral costophrenic angles are clear. Normal cardio-mediastinal silhouette. No acute osseous abnormalities. The soft tissues are within normal limits. IMPRESSION: No active disease. Electronically Signed   By: Ree Molt M.D.   On: 08/11/2023 14:54   DG Hip Unilat W or Wo Pelvis 2-3  Views Right Result Date: 08/11/2023 CLINICAL DATA:  Right hip pain. EXAM: DG HIP (WITH OR WITHOUT PELVIS) 2-3V RIGHT COMPARISON:  None Available. FINDINGS: No acute fracture or dislocation. 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 Visualized sacral arcuate lines are unremarkable. Unremarkable symphysis pubis. There are mild degenerative changes of bilateral hip joints with joint space narrowing and osteophytosis of the superior acetabulum. No radiopaque foreign bodies. IMPRESSION: *Bilateral femoral head AVN. Electronically Signed   By: Ree Molt M.D.   On: 08/11/2023 14:54    Pending Labs Unresulted Labs (From admission, onward)     Start     Ordered   08/12/23 0500  Comprehensive metabolic panel  Tomorrow morning,   R        08/11/23 1938   08/12/23 0500  CBC  Tomorrow morning,   R        08/11/23 1938   08/11/23 1939  Uric acid  Add-on,   AD        08/11/23 1938   08/11/23 1939  C-reactive protein  Add-on,   AD        08/11/23 1938   08/11/23 1939  Sedimentation rate  Add-on,   AD        08/11/23 1938   08/11/23 1937  Hepatitis panel, acute  Add-on,   AD        08/11/23 1936   08/11/23 1934  Hemoglobin A1c  Once,   R        08/11/23 1933            Vitals/Pain Today's Vitals   08/11/23 1700 08/11/23 1714 08/11/23 1816 08/11/23 1829  BP: (!) 141/82     Pulse: (!) 108     Resp: 19     Temp:   98.1 F (36.7 C)   TempSrc:   Oral   SpO2: 99%     Weight:  63.5 kg    Height:  5' 2 (1.575 m)    PainSc:    10-Worst pain ever    Isolation  Precautions No active isolations  Medications Medications  allopurinol  (ZYLOPRIM ) tablet 100 mg (has no administration in time range)  furosemide  (LASIX ) tablet 20 mg (has no administration in time range)  losartan  (COZAAR ) tablet 25 mg (has no administration in time range)  metoprolol  succinate (TOPROL -XL) 24  hr tablet 50 mg (has no administration in time range)  rosuvastatin  (CRESTOR ) tablet 10 mg (has no administration in time range)  spironolactone  (ALDACTONE ) tablet 25 mg (has no administration in time range)  empagliflozin  (JARDIANCE ) tablet 10 mg (has no administration in time range)  levothyroxine  (SYNTHROID ) tablet 88 mcg (has no administration in time range)  heparin  injection 5,000 Units (has no administration in time range)  sodium chloride  flush (NS) 0.9 % injection 3 mL (has no administration in time range)  sodium chloride  flush (NS) 0.9 % injection 3 mL (has no administration in time range)  0.9 %  sodium chloride  infusion (has no administration in time range)  acetaminophen  (TYLENOL ) tablet 650 mg (has no administration in time range)    Or  acetaminophen  (TYLENOL ) suppository 650 mg (has no administration in time range)  oxyCODONE  (Oxy IR/ROXICODONE ) immediate release tablet 5 mg (has no administration in time range)  senna-docusate (Senokot-S) tablet 1 tablet (has no administration in time range)  ondansetron  (ZOFRAN ) tablet 4 mg (has no administration in time range)    Or  ondansetron  (ZOFRAN ) injection 4 mg (has no administration in time range)  acetaminophen  (TYLENOL ) tablet 1,000 mg (1,000 mg Oral Given 08/11/23 1723)  ketorolac  (TORADOL ) 15 MG/ML injection 15 mg (15 mg Intravenous Given 08/11/23 1932)  HYDROmorphone  (DILAUDID ) injection 1 mg (1 mg Intravenous Given 08/11/23 1933)  ondansetron  (ZOFRAN ) injection 4 mg (4 mg Intravenous Given 08/11/23 1928)    Mobility walks     Focused Assessments Cardiac Assessment Handoff:    Lab Results  Component Value Date   CKTOTAL 659 (H) 07/28/2016   TROPONINI 0.04 (HH) 07/29/2016   Lab Results  Component Value Date   DDIMER 0.92 (H) 07/27/2023   Does the Patient currently have chest pain? No    R Recommendations: See Admitting Provider Note  Report given to:   Additional Notes:

## 2023-08-12 ENCOUNTER — Inpatient Hospital Stay (HOSPITAL_COMMUNITY): Payer: PPO

## 2023-08-12 DIAGNOSIS — M87059 Idiopathic aseptic necrosis of unspecified femur: Secondary | ICD-10-CM | POA: Diagnosis not present

## 2023-08-12 LAB — CBC
HCT: 34.8 % — ABNORMAL LOW (ref 36.0–46.0)
Hemoglobin: 11.4 g/dL — ABNORMAL LOW (ref 12.0–15.0)
MCH: 30 pg (ref 26.0–34.0)
MCHC: 32.8 g/dL (ref 30.0–36.0)
MCV: 91.6 fL (ref 80.0–100.0)
Platelets: 222 10*3/uL (ref 150–400)
RBC: 3.8 MIL/uL — ABNORMAL LOW (ref 3.87–5.11)
RDW: 14.6 % (ref 11.5–15.5)
WBC: 5.9 10*3/uL (ref 4.0–10.5)
nRBC: 0 % (ref 0.0–0.2)

## 2023-08-12 LAB — COMPREHENSIVE METABOLIC PANEL
ALT: 77 U/L — ABNORMAL HIGH (ref 0–44)
AST: 92 U/L — ABNORMAL HIGH (ref 15–41)
Albumin: 2.5 g/dL — ABNORMAL LOW (ref 3.5–5.0)
Alkaline Phosphatase: 163 U/L — ABNORMAL HIGH (ref 38–126)
Anion gap: 7 (ref 5–15)
BUN: 26 mg/dL — ABNORMAL HIGH (ref 8–23)
CO2: 24 mmol/L (ref 22–32)
Calcium: 8.6 mg/dL — ABNORMAL LOW (ref 8.9–10.3)
Chloride: 109 mmol/L (ref 98–111)
Creatinine, Ser: 1 mg/dL (ref 0.44–1.00)
GFR, Estimated: >60 mL/min (ref >60)
Glucose, Bld: 117 mg/dL — ABNORMAL HIGH (ref 70–99)
Potassium: 4.1 mmol/L (ref 3.5–5.1)
Sodium: 140 mmol/L (ref 135–145)
Total Bilirubin: 0.5 mg/dL (ref 0.0–1.2)
Total Protein: 6 g/dL — ABNORMAL LOW (ref 6.5–8.1)

## 2023-08-12 MED ORDER — KETOROLAC TROMETHAMINE 10 MG PO TABS
10.0000 mg | ORAL_TABLET | Freq: Three times a day (TID) | ORAL | Status: AC
  Administered 2023-08-12 – 2023-08-15 (×9): 10 mg via ORAL
  Filled 2023-08-12 (×9): qty 1

## 2023-08-12 MED ORDER — POLYETHYLENE GLYCOL 3350 17 G PO PACK: 17.0000 g | PACK | Freq: Every day | ORAL | Status: DC | PRN

## 2023-08-12 NOTE — Evaluation (Signed)
 Occupational Therapy Evaluation Patient Details Name: Kathleen Velazquez MRN: [id number] DOB: Dec 29, 1955 Today's Date: 08/12/2023   History of Present Illness Patient is 68 y.o. female presented to ED for Lt arm and Rt leg pain for 3-4 months with worsening in last 3 weeks. PMH  significant of chronic sinus tachycardia, essential hypertension, diastolic heart failure preserved EF, generalized anxiety disorder, chronic pericardial effusion, CAD, CKD stage II, distal left upper extremity and right lower extremity swelling which is chronic in nature. 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   Clinical Impression   Pt c/o significant abdominal pain with nausea and vomiting, did not eat lunch, not hungry. Pt lives with daughter who works during the day, and daughters partner who stays at home and is her caregiver. Pt lives in basement. No stairs to enter, PLOF mod I for ADLs, family helps with meals. Pt has had worsening LUE stiffness and pain at shoudler/wrist/fingers, limiting functional independence. Pt is able to complete ADLs with min A and set up, due to LUE deficits does not use RW safely, trial for platform walker for safety with mobility. Pt would benefit from continued acute therapy to maximize progress and independence, HHOT recommended upon DC.        If plan is discharge home, recommend the following: A little help with walking and/or transfers;A little help with bathing/dressing/bathroom;Assistance with cooking/housework;Assist for transportation;Help with stairs or ramp for entrance    Functional Status Assessment  Patient has had a recent decline in their functional status and demonstrates the ability to make significant improvements in function in a reasonable and predictable amount of time.  Equipment Recommendations  None recommended by OT    Recommendations for Other Services        Precautions / Restrictions Restrictions Weight Bearing Restrictions Per Provider Order: No      Mobility Bed Mobility Overal bed mobility: Needs Assistance, Modified Independent             General bed mobility comments: mod I    Transfers Overall transfer level: Needs assistance Equipment used: Rolling walker (2 wheels) Transfers: Sit to/from Stand, Bed to chair/wheelchair/BSC Sit to Stand: Contact guard assist     Step pivot transfers: Contact guard assist     General transfer comment: CGA for transfers with RW, does not use RW safely, but no LOB during ambulation      Balance Overall balance assessment: Needs assistance Sitting-balance support: No upper extremity supported, Feet supported Sitting balance-Leahy Scale: Good Sitting balance - Comments: EOB   Standing balance support: Single extremity supported, During functional activity Standing balance-Leahy Scale: Fair Standing balance comment: able to stand with one hand on RW for support.                           ADL either performed or assessed with clinical judgement   ADL Overall ADL's : Needs assistance/impaired Eating/Feeding: Set up;Sitting   Grooming: Set up;Sitting   Upper Body Bathing: Minimal assistance;Sitting   Lower Body Bathing: Minimal assistance;Sit to/from stand   Upper Body Dressing : Minimal assistance;Sitting   Lower Body Dressing: Minimal assistance;Sit to/from stand   Toilet Transfer: Contact guard assist;Rolling walker (2 wheels)   Toileting- Clothing Manipulation and Hygiene: Minimal assistance;Sitting/lateral lean       Functional mobility during ADLs: Contact guard assist;Rolling walker (2 wheels) General ADL Comments: Pt LUE  has been stiffening over the last month, limiting independence with ADLs. Pt has difficulty using RW safely with ambulation, does not want to use cane, platform walker may work better. Pt able to don/doff socks with increased effort.      Vision Baseline Vision/History: 1 Wears glasses Ability to See in Adequate Light: 0 Adequate Patient Visual Report: No change from baseline       Perception         Praxis         Pertinent Vitals/Pain Pain Assessment Pain Assessment: 0-10 Pain Score: 8  Pain Location: abdomen Pain Descriptors / Indicators: Aching, Cramping, Discomfort, Grimacing Pain Intervention(s): Monitored during session     Extremity/Trunk Assessment Upper Extremity Assessment Upper Extremity Assessment: LUE deficits/detail LUE Deficits / Details: stiff and painful shoulder, ABD to 70 degrees, less than full ROM elbow flexion, not able to touch face with L hand. Pt digits 1-5 little to no active flexion, in neutral position. limited with supination. LUE: Shoulder pain with ROM LUE Sensation: WNL LUE Coordination: decreased gross motor   Lower Extremity Assessment Lower Extremity Assessment: Defer to PT evaluation       Communication Communication Communication: No apparent difficulties   Cognition Arousal: Alert Behavior During Therapy: WFL for tasks assessed/performed Overall Cognitive Status: Within Functional Limits for tasks assessed                                 General Comments: slow with recalling date, A/Ox4 otherwise.     General Comments       Exercises     Shoulder Instructions      Home Living Family/patient expects to be discharged to:: Private residence Living Arrangements: Children Available Help at Discharge: Family;Personal care attendant;Available 24 hours/day Type of Home: House Home Access: Level entry   Entrance Stairs-Rails: Right;Left;Can reach both Home Layout: Multi-level;Other (Comment) Alternate Level Stairs-Number of Steps: flight Alternate Level Stairs-Rails: Right Bathroom Shower/Tub: Tub/shower unit   Bathroom Toilet: Handicapped height Bathroom Accessibility: No   Home Equipment: Agricultural [employer]ant (2 wheels);Rollator (4  wheels);Cane - single point;Grab bars - tub/shower;Shower seat;Electric scooter   Additional Comments: daughters partner is her personal caregiver, family lives upstairs and pt lives in basement apt with level entry, does not go up stairs.      Prior Functioning/Environment Prior Level of Function : Needs assist             Mobility Comments: uses RW for mobility, RW and scooter for outside the home. ADLs Comments: Pt reports ind for ADLs, daughter and her partner help with meals.        OT Problem List: Decreased strength;Decreased range of motion;Decreased activity tolerance;Impaired balance (sitting and/or standing);Decreased safety awareness;Decreased knowledge of use of DME or AE;Pain;Impaired UE functional use      OT Treatment/Interventions: Self-care/ADL training;Therapeutic exercise;Energy conservation;DME and/or AE instruction;Therapeutic activities;Manual therapy;Balance training;Patient/family education    OT Goals(Current goals can be found in the care plan section) Acute Rehab OT Goals Patient Stated Goal: to improve strength OT Goal Formulation: With patient Time For Goal Achievement: 08/26/23 Potential to Achieve Goals: Good  OT Frequency: Min 1X/week    Co-evaluation              AM-PAC OT 6 Clicks Daily Activity     Outcome Measure Help from another person eating meals?: A Little Help from another person taking care of personal grooming?: A Little Help from another  person toileting, which includes using toliet, bedpan, or urinal?: A Little Help from another person bathing (including washing, rinsing, drying)?: A Little Help from another person to put on and taking off regular upper body clothing?: A Little Help from another person to put on and taking off regular lower body clothing?: A Little 6 Click Score: 18   End of Session Equipment Utilized During Treatment: Gait belt;Rolling walker (2 wheels) Nurse Communication: Mobility status  Activity  Tolerance: Patient tolerated treatment well Patient left: in bed;with call bell/phone within reach  OT Visit Diagnosis: Unsteadiness on feet (R26.81);Other abnormalities of gait and mobility (R26.89);Muscle weakness (generalized) (M62.81);Pain Pain - part of body:  (abdomen)                Time: 8543-8472 OT Time Calculation (min): 31 min Charges:  OT General Charges $OT Visit: 1 Visit OT Evaluation $OT Eval Low Complexity: 1 Low OT Treatments $Self Care/Home Management : 8-22 mins  [place], OTR/L   Kathleen Velazquez 08/12/2023, 3:38 PM

## 2023-08-12 NOTE — Consult Note (Signed)
 Orthopaedic Trauma Service (OTS) Consult   Patient ID: Kathleen Velazquez MRN: [id number] DOB/AGE: Jun 09, 1956 68 y.o.   Reason for Consult: Bilateral femoral head avascular necrosis Referring Physician: Subrina Sundil, MD (hospitalist)   HPI: Kathleen Velazquez is an 68 y.o. female with history for severe alcohol abuse who has been sober approximately 10 years who presented to the emergency department last night with complaints of left arm pain and right leg pain.  This has been going on for at least 4 weeks.  She is right-hand dominant.  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  Orthopedics was consulted for recommendations.  Patient has a complex medical history notable for hypertension heart failure, CKD, CAD.  She also reports previous history of right shoulder adhesive capsulitis that was treated in [place] a few months ago.  She denies any acute traumas.  States her symptoms have been going on for at least a month.  Denies any alleviating interventions and it has been worsening over the last several weeks.  She is unable to describe the pain.  With respect to her right leg it goes from her hip down to her foot.  She has noticed some color changes to her bilateral lower extremities as well.  Denies any burning type sensations with respect to her left upper extremity she has not been able to move her shoulder in several weeks.  Patient had ultrasounds middle of November which were negative for DVTs in her left upper extremity also had arterial duplex of the right leg which was negative.  Patient's medical history is notable for allopurinol  on her medication list but she is unable to recall if she actually has gout.  Her primary care doctor is in [place] (Dr. Orpha)  Patient is somewhat of a poor historian  Past Medical History:  Diagnosis Date   Anxiety    Arthritis    knees,    Ejection fraction    Normal, echo, June, 2013   GERD (gastroesophageal reflux  disease)    Headache(784.0)    hx of migraines    History of hiatal hernia    Hypertension    Irritable bowel syndrome (IBS)    PONV (postoperative nausea and vomiting)    Sinus tachycardia    Persistent sinus tachycardia, June, 2013 ( prior monitor in 2007 had shown no significant abnormalities.)    Past Surgical History:  Procedure Laterality Date   CHOLECYSTECTOMY     ESOPHAGOGASTRODUODENOSCOPY N/A 08/19/2016   Procedure: ESOPHAGOGASTRODUODENOSCOPY (EGD);  Surgeon: Claudis RAYMOND Rivet, MD;  [facility];  Service: Endoscopy;  Laterality: N/A;  2:50   EYE SURGERY     cataract   HEMORRHOID SURGERY N/A 05/20/2013   Procedure: EXTENSIVE HEMORRHOIDECTOMY;  Surgeon: Oneil DELENA Budge, MD;  Location: [facility];  Service: General;  Laterality: N/A;   JOINT REPLACEMENT     right knee replacement    OTHER SURGICAL HISTORY     left breast biopsy - benign    OTHER SURGICAL HISTORY     hx of skin cancer surgery    RIGHT/LEFT HEART CATH AND CORONARY ANGIOGRAPHY N/A 11/22/2022   Procedure: RIGHT/LEFT HEART CATH AND CORONARY ANGIOGRAPHY;  Surgeon: Verlin Lonni BIRCH, MD;  Location: [facility];  Service: Cardiovascular;  Laterality: N/A;   TOTAL KNEE ARTHROPLASTY  11/28/2011   Procedure: TOTAL KNEE ARTHROPLASTY;  Surgeon: Dempsey LULLA Moan, MD;  Location: [facility];  Service: Orthopedics;  Laterality: Left;  TOTAL KNEE REVISION Right 02/26/2020   Procedure: TOTAL KNEE REVISION;  Surgeon: Melodi Lerner, MD;  Location: [facility];  Service: Orthopedics;  Laterality: Right;    TUBAL LIGATION      Family History  Problem Relation Age of Onset   Irritable bowel syndrome Sister     Social History:  reports that she has never smoked. She has never used smokeless tobacco. She reports that she does not currently use alcohol. She reports that she does not use drugs.  Allergies:  Allergies  Allergen Reactions   Codeine Nausea And Vomiting    Medications: I have reviewed the patient's  current medications. Current Meds  Medication Sig   allopurinol  (ZYLOPRIM ) 100 MG tablet Take 100 mg by mouth daily.   empagliflozin  (JARDIANCE ) 10 MG TABS tablet Take 1 tablet (10 mg total) by mouth daily.   furosemide  (LASIX ) 20 MG tablet Take 20 mg by mouth 2 (two) times a week.   levothyroxine  (SYNTHROID ) 88 MCG tablet Take 88 mcg by mouth daily before breakfast.   losartan  (COZAAR ) 25 MG tablet Take 1 tablet (25 mg total) by mouth daily. (Patient taking differently: Take 12.5 mg by mouth daily.)   metoprolol  succinate (TOPROL -XL) 50 MG 24 hr tablet Take 1 tablet (50 mg total) by mouth daily. Take with or immediately following a meal.   spironolactone  (ALDACTONE ) 25 MG tablet Take 1 tablet (25 mg total) by mouth daily.     Results for orders placed or performed during the hospital encounter of 08/11/23 (from the past 48 hours)  Resp panel by RT-PCR (RSV, Flu A&B, Covid) Anterior Nasal Swab     Status: None   Collection Time: 08/11/23 12:21 PM   Specimen: Anterior Nasal Swab  Result Value Ref Range   SARS Coronavirus 2 by RT PCR NEGATIVE NEGATIVE   Influenza A by PCR NEGATIVE NEGATIVE   Influenza B by PCR NEGATIVE NEGATIVE    Comment: (NOTE) The Xpert Xpress SARS-CoV-2/FLU/RSV plus assay is intended as an aid in the diagnosis of influenza from Nasopharyngeal swab specimens and should not be used as a sole basis for treatment. Nasal washings and aspirates are unacceptable for Xpert Xpress SARS-CoV-2/FLU/RSV testing.  Fact Sheet for Patients: bloggercourse.com  Fact Sheet for Healthcare Providers: seriousbroker.it  This test is not yet approved or cleared by the [place]  FDA and has been authorized for detection and/or diagnosis of SARS-CoV-2 by FDA under an Emergency Use Authorization (EUA). This EUA will remain in effect (meaning this test can be used) for the duration of the COVID-19 declaration under Section 564(b)(1) of  the Act, 21 U.S.C. section 360bbb-3(b)(1), unless the authorization is terminated or revoked.     Resp Syncytial Virus by PCR NEGATIVE NEGATIVE    Comment: (NOTE) Fact Sheet for Patients: bloggercourse.com  Fact Sheet for Healthcare Providers: seriousbroker.it  This test is not yet approved or cleared by the [place]  FDA and has been authorized for detection and/or diagnosis of SARS-CoV-2 by FDA under an Emergency Use Authorization (EUA). This EUA will remain in effect (meaning this test can be used) for the duration of the COVID-19 declaration under Section 564(b)(1) of the Act, 21 U.S.C. section 360bbb-3(b)(1), unless the authorization is terminated or revoked.  Performed at [facility], [address], [place], [place] [address]   Comprehensive metabolic panel     Status: Abnormal   Collection Time: 08/11/23 12:30 PM  Result Value Ref Range   Sodium 138 135 - 145 mmol/L  Potassium 3.8 3.5 - 5.1 mmol/L   Chloride 105 98 - 111 mmol/L   CO2 23 22 - 32 mmol/L   Glucose, Bld 212 (H) 70 - 99 mg/dL    Comment: Glucose reference range applies only to samples taken after fasting for at least 8 hours.   BUN 17 8 - 23 mg/dL   Creatinine, Ser 9.23 0.44 - 1.00 mg/dL   Calcium  9.1 8.9 - 10.3 mg/dL   Total Protein 7.2 6.5 - 8.1 g/dL   Albumin  3.0 (L) 3.5 - 5.0 g/dL   AST 42 (H) 15 - 41 U/L   ALT 50 (H) 0 - 44 U/L   Alkaline Phosphatase 169 (H) 38 - 126 U/L   Total Bilirubin 0.7 0.0 - 1.2 mg/dL   GFR, Estimated >39 >39 mL/min    Comment: (NOTE) Calculated using the CKD-EPI Creatinine Equation (2021)    Anion gap 10 5 - 15    Comment: Performed at [facility], [address], [place], [place] [address]  CBC     Status: None   Collection Time: 08/11/23 12:30 PM  Result Value Ref Range   WBC 6.3 4.0 - 10.5 K/uL   RBC 4.17 3.87 - 5.11 MIL/uL   Hemoglobin 12.4 12.0 - 15.0 g/dL   HCT 61.5 63.9 - 53.9 %   MCV 92.1  80.0 - 100.0 fL   MCH 29.7 26.0 - 34.0 pg   MCHC 32.3 30.0 - 36.0 g/dL   RDW 85.3 88.4 - 84.4 %   Platelets 236 150 - 400 K/uL   nRBC 0.0 0.0 - 0.2 %    Comment: Performed at [facility], [address], [place], [place] [address]  Brain natriuretic peptide     Status: None   Collection Time: 08/11/23 12:30 PM  Result Value Ref Range   B Natriuretic Peptide 88.9 0.0 - 100.0 pg/mL    Comment: Performed at [facility], [address], [place], [place] [address]  Hepatitis panel, acute     Status: None   Collection Time: 08/11/23  8:35 PM  Result Value Ref Range   Hepatitis B Surface Ag NON REACTIVE NON REACTIVE   HCV Ab NON REACTIVE NON REACTIVE    Comment: (NOTE) Nonreactive HCV antibody screen is consistent with no HCV infections,  unless recent infection is suspected or other evidence exists to indicate HCV infection.     Hep A IgM NON REACTIVE NON REACTIVE   Hep B C IgM NON REACTIVE NON REACTIVE    Comment: Performed at [facility], [address], [place], [place] [address]  Uric acid     Status: None   Collection Time: 08/11/23  8:35 PM  Result Value Ref Range   Uric Acid, Serum 4.4 2.5 - 7.1 mg/dL    Comment: Performed at [facility], [address] [place], [place] [address]  C-reactive protein     Status: Abnormal   Collection Time: 08/11/23  8:35 PM  Result Value Ref Range   CRP 7.9 (H) <1.0 mg/dL    Comment: Performed at [facility], [address], [place], [place] [address]  Sedimentation rate     Status: Abnormal   Collection Time: 08/11/23  8:35 PM  Result Value Ref Range   Sed Rate 76 (H) 0 - 22 mm/hr    Comment: Performed at [facility], [address] [place], [place] [address]  Comprehensive metabolic panel  Status: Abnormal   Collection Time: 08/12/23  4:30 AM  Result Value Ref Range   Sodium 140 135 - 145 mmol/L   Potassium 4.1 3.5 - 5.1 mmol/L   Chloride 109 98 - 111 mmol/L   CO2 24 22 - 32 mmol/L    Glucose, Bld 117 (H) 70 - 99 mg/dL    Comment: Glucose reference range applies only to samples taken after fasting for at least 8 hours.   BUN 26 (H) 8 - 23 mg/dL   Creatinine, Ser 8.99 0.44 - 1.00 mg/dL   Calcium  8.6 (L) 8.9 - 10.3 mg/dL   Total Protein 6.0 (L) 6.5 - 8.1 g/dL   Albumin  2.5 (L) 3.5 - 5.0 g/dL   AST 92 (H) 15 - 41 U/L   ALT 77 (H) 0 - 44 U/L   Alkaline Phosphatase 163 (H) 38 - 126 U/L   Total Bilirubin 0.5 0.0 - 1.2 mg/dL   GFR, Estimated >39 >39 mL/min    Comment: (NOTE) Calculated using the CKD-EPI Creatinine Equation (2021)    Anion gap 7 5 - 15    Comment: Performed at [facility], [address] [place], [place] [address]  CBC     Status: Abnormal   Collection Time: 08/12/23  4:30 AM  Result Value Ref Range   WBC 5.9 4.0 - 10.5 K/uL   RBC 3.80 (L) 3.87 - 5.11 MIL/uL   Hemoglobin 11.4 (L) 12.0 - 15.0 g/dL   HCT 65.1 (L) 63.9 - 53.9 %   MCV 91.6 80.0 - 100.0 fL   MCH 30.0 26.0 - 34.0 pg   MCHC 32.8 30.0 - 36.0 g/dL   RDW 85.3 88.4 - 84.4 %   Platelets 222 150 - 400 K/uL   nRBC 0.0 0.0 - 0.2 %    Comment: Performed at [facility], [address], [place], [place] [address]    MR HIP RIGHT WO CONTRAST Result Date: 08/12/2023 CLINICAL DATA:  Increasing right hip pain for 3-4 months. Bilateral femoral head osteonecrosis on radiographs. EXAM: MR OF THE BILATERAL HIPS WITHOUT CONTRAST TECHNIQUE: Multiplanar, multisequence MR imaging of the pelvis and both hips was performed. No intravenous contrast was administered. COMPARISON:  Radiographs same date.  No other comparison studies. FINDINGS: Bones: As demonstrated on earlier radiographs, there are changes of advanced bilateral femoral head osteonecrosis with early subchondral collapse of both femoral heads. There is mild asymmetric edema within the right femoral neck. The visualized bony pelvis appears normal. The visualized sacroiliac joints and symphysis pubis appear normal. Articular cartilage and labrum  Articular cartilage: Mild secondary degenerative changes at both hips. Labrum: Bilateral acetabular labral degeneration without gross tear or significant paralabral cyst. Joint or bursal effusion Joint effusion: Trace hip joint fluid bilaterally. Bursae: No focal periarticular fluid collection. Muscles and tendons Muscles and tendons: Mild asymmetric common hamstring tendinosis on the right. The left common hamstring tendon appears normal. The gluteus and iliopsoas tendons appear normal bilaterally. No focal muscular atrophy or edema. The piriformis muscles appear symmetric. Other findings Miscellaneous: Mildly prominent fat in both inguinal canals, greater on the right. Bilateral Bartholin cysts are noted, larger on the left. The visualized internal pelvic contents are otherwise unremarkable. IMPRESSION: 1. Advanced bilateral femoral head osteonecrosis with early subchondral collapse of both femoral heads and mild secondary degenerative changes at both hips. 2. No acute osseous findings. 3. Mild asymmetric right common hamstring tendinosis. 4. Bilateral Bartholin cysts, larger on the left. Electronically Signed   By:  Elsie Perone M.D.   On: 08/12/2023 08:36   MR HIP LEFT WO CONTRAST Result Date: 08/12/2023 CLINICAL DATA:  Increasing right hip pain for 3-4 months. Bilateral femoral head osteonecrosis on radiographs. EXAM: MR OF THE BILATERAL HIPS WITHOUT CONTRAST TECHNIQUE: Multiplanar, multisequence MR imaging of the pelvis and both hips was performed. No intravenous contrast was administered. COMPARISON:  Radiographs same date.  No other comparison studies. FINDINGS: Bones: As demonstrated on earlier radiographs, there are changes of advanced bilateral femoral head osteonecrosis with early subchondral collapse of both femoral heads. There is mild asymmetric edema within the right femoral neck. The visualized bony pelvis appears normal. The visualized sacroiliac joints and symphysis pubis appear normal.  Articular cartilage and labrum Articular cartilage: Mild secondary degenerative changes at both hips. Labrum: Bilateral acetabular labral degeneration without gross tear or significant paralabral cyst. Joint or bursal effusion Joint effusion: Trace hip joint fluid bilaterally. Bursae: No focal periarticular fluid collection. Muscles and tendons Muscles and tendons: Mild asymmetric common hamstring tendinosis on the right. The left common hamstring tendon appears normal. The gluteus and iliopsoas tendons appear normal bilaterally. No focal muscular atrophy or edema. The piriformis muscles appear symmetric. Other findings Miscellaneous: Mildly prominent fat in both inguinal canals, greater on the right. Bilateral Bartholin cysts are noted, larger on the left. The visualized internal pelvic contents are otherwise unremarkable. IMPRESSION: 1. Advanced bilateral femoral head osteonecrosis with early subchondral collapse of both femoral heads and mild secondary degenerative changes at both hips. 2. No acute osseous findings. 3. Mild asymmetric right common hamstring tendinosis. 4. Bilateral Bartholin cysts, larger on the left. Electronically Signed   By: Elsie Perone M.D.   On: 08/12/2023 08:36   DG Shoulder Left Result Date: 08/11/2023 CLINICAL DATA:  Shoulder pain EXAM: LEFT SHOULDER - 2+ VIEW COMPARISON:  None Available. FINDINGS: Degenerative changes in the [facility] joint with joint space narrowing and spurring. Glenohumeral joint is maintained. No acute bony abnormality. Specifically, no fracture, subluxation, or dislocation. Soft tissues are intact. IMPRESSION: Degenerative changes in the left AC joint. No acute bony abnormality. Electronically Signed   By: Franky Crease M.D.   On: 08/11/2023 20:20   DG Chest 1 View Result Date: 08/11/2023 CLINICAL DATA:  Cough. EXAM: CHEST  1 VIEW COMPARISON:  11/23/2022. FINDINGS: Bilateral lung fields are clear. Bilateral costophrenic angles are clear. Normal cardio-mediastinal  silhouette. No acute osseous abnormalities. The soft tissues are within normal limits. IMPRESSION: No active disease. Electronically Signed   By: Ree Molt M.D.   On: 08/11/2023 14:54   DG Hip Unilat W or Wo Pelvis 2-3 Views Right Result Date: 08/11/2023 CLINICAL DATA:  Right hip pain. EXAM: DG HIP (WITH OR WITHOUT PELVIS) 2-3V RIGHT COMPARISON:  None Available. FINDINGS: No acute fracture or dislocation. 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 Visualized sacral arcuate lines are unremarkable. Unremarkable symphysis pubis. There are mild degenerative changes of bilateral hip joints with joint space narrowing and osteophytosis of the superior acetabulum. No radiopaque foreign bodies. IMPRESSION: *Bilateral femoral head AVN. Electronically Signed   By: Ree Molt M.D.   On: 08/11/2023 14:54    Intake/Output      01/03 0701 01/04 0700 01/04 0701 01/05 0700        Urine Occurrence 2 x       ROS As above Blood pressure 115/76, pulse 79, temperature (!) 97.5 F (36.4 C), resp. rate 18, height  5' 2 (1.575 m), weight 63.4 kg, SpO2 95%. Physical Exam Vitals and nursing note reviewed.  Constitutional:      General: She is awake.     Appearance: Normal appearance.     Comments: No acute distress  Cardiovascular:     Heart sounds: S1 normal and S2 normal.  Pulmonary:     Effort: No respiratory distress.  Abdominal:     General: Abdomen is flat. Bowel sounds are normal.  Musculoskeletal:     Comments: Left upper extremity Patient has swelling and some muscle wasting to the distal aspect of her left upper extremity She is very reluctant to move her shoulder and has pain with gentle manipulation of her shoulder. She is apprehensive to move her elbow but after a while I am able to passively range her elbow without much difficulty.  She does not want to supinate or  pronate her forearm either Radial, ulnar, median nerve motor and sensory functions are grossly intact distally although she appears to be hypersensitive with light touch Palpable radial pulse is noted good perfusion distally.  Extremity is warm.  Good coloration to the distal extremity Diffusely tender with palpation to the left shoulder.  Nontender over her clavicle or scapula  Bilateral lower extremities Mild discomfort with axial loading or logrolling of her hips bilaterally + DP pulses are noted Skin color changes noted bilaterally these are symmetric, ?  Hemosiderin staining Skin has appearance of chronic peripheral vascular disease Well-healed right total knee incision noted Diffuse sarcopenia to bilateral lower extremities No open or traumatic wounds noted DPN, SPN, TN sensory functions are grossly intact bilaterally and symmetric EHL, FHL, lesser toe motor functions are grossly intact bilaterally and are symmetric Ankle flexion, extension, inversion eversion are grossly intact  Neurological:     Mental Status: She is alert.  Psychiatric:        Behavior: Behavior is cooperative.       Assessment/Plan:  68 year old female with acute right hip pain and left shoulder pain  -Polyarthralgias including right hip pain and left shoulder pain  MRI confirms presence of bilateral hip AVN.  This is likely due to her extensive alcohol abuse in the past.  She would benefit for referral to a total joint surgeon to evaluate for total hip arthroplasties.  Continue with symptomatic management for the time being.  Can try topical anti-inflammatories or systemic anti-inflammatories.  Activity as tolerated.  Weight-bear as tolerated bilateral lower extremities   Left shoulder pain-given presence of bilateral hip AVN concerned that she may have AVN of her left shoulder as well.  I will obtain an MRI of her left shoulder without contrast to evaluate for this.  Think if her MRI is without any acute  pathology in the shoulder we could then consider workup for cervical spine etiology.  But I suspect her extremity swelling is due to the fact that she is immobilized herself over the last several weeks.  No motion restrictions with respect to her left shoulder.  Symptomatic management as well   Bilateral foot and ankle pain   Concerned that there may be a vascular component given her skin changes.  There could also be a nerve component as well.  Will start with ABIs and proceed accordingly based off results  - Pain management:  Multimodal   Ice, heat   Nsaids    Opioids for severe pain   - Dispo:  Follow up on studies  Continue with current care  Will need outpt follow  up with total joint surgeon ([facility])    Francis MICAEL Mt, PA-C [phone] (C) 08/12/2023, 10:43 AM  [facility] [address] [place] [address] [phone] [phone] (F)    After 5pm and on the weekends please log on to Amion, go to orthopaedics and the look under the [facility] Call for the provider(s) on call. You can also call our office at [phone] and then follow the prompts to be connected to the call team.

## 2023-08-12 NOTE — Plan of Care (Signed)
  Problem: Education: Goal: Knowledge of General Education information will improve Description: Including pain rating scale, medication(s)/side effects and non-pharmacologic comfort measures 08/12/2023 0145 by Lennie Rodgers BIRCH, RN Outcome: Progressing 08/11/2023 2227 by Lennie Rodgers BIRCH, RN Outcome: Progressing   Problem: Health Behavior/Discharge Planning: Goal: Ability to manage health-related needs will improve Outcome: Progressing   Problem: Clinical Measurements: Goal: Ability to maintain clinical measurements within normal limits will improve Outcome: Progressing Goal: Will remain free from infection 08/12/2023 0145 by Lennie Rodgers BIRCH, RN Outcome: Progressing 08/11/2023 2227 by Lennie Rodgers BIRCH, RN Outcome: Progressing Goal: Diagnostic test results will improve Outcome: Progressing Goal: Respiratory complications will improve Outcome: Progressing Goal: Cardiovascular complication will be avoided Outcome: Progressing   Problem: Activity: Goal: Risk for activity intolerance will decrease Outcome: Progressing   Problem: Nutrition: Goal: Adequate nutrition will be maintained 08/12/2023 0145 by Lennie Rodgers BIRCH, RN Outcome: Progressing 08/11/2023 2227 by Lennie Rodgers BIRCH, RN Outcome: Progressing   Problem: Coping: Goal: Level of anxiety will decrease Outcome: Progressing   Problem: Elimination: Goal: Will not experience complications related to bowel motility Outcome: Progressing Goal: Will not experience complications related to urinary retention 08/12/2023 0145 by Lennie Rodgers BIRCH, RN Outcome: Progressing 08/11/2023 2227 by Lennie Rodgers BIRCH, RN Outcome: Progressing   Problem: Pain Management: Goal: General experience of comfort will improve Outcome: Progressing   Problem: Safety: Goal: Ability to remain free from injury will improve 08/12/2023 0145 by Lennie Rodgers BIRCH, RN Outcome: Progressing 08/11/2023 2227 by Lennie Rodgers BIRCH, RN Outcome: Progressing   Problem: Skin  Integrity: Goal: Risk for impaired skin integrity will decrease Outcome: Progressing

## 2023-08-12 NOTE — Progress Notes (Signed)
 PROGRESS NOTE  Kathleen Velazquez  DOB: 05-16-1956  PCP: Orpha Yancey LABOR, MD FMW:[id number]  DOA: 08/11/2023  LOS: 1 day  Hospital Day: 2  Brief narrative: Kathleen Velazquez is a 68 y.o. female with PMH significant for HTN, CAD, chronic sinus tachycardia, chronic pericardial effusion, CKD, anxiety, IBS, h/o right sided frozen shoulder, distal left upper extremity and right lower extremity swelling which is chronic in nature. 1/3, patient presented to ED with complaint of left arm pain and right lower extremity pain for the last 3 weeks. States the pain in her left arm starts in her shoulder and radiates to her fingers. Feels like a sharp burning pain its constant. Denies any pain radiating in her chest and also denies shortness of breath. States the pain in her right leg started around the same time. Starts in the right hip and extends all the way down to the toes.  States she is reluctant to move her arm and leg because of the pain.  Denies any fall or trauma. On 12/20, she had ultrasound duplex arterial and venous of right lower extremity both of which were negative, ultrasound duplex venous of left upper extremity negative as well.   In the ED, patient was tachycardic to 110s, otherwise hemodynamically stable Labs with mildly elevated AST, ALT and alk phos X-ray of the right hip bilateral femoral head avascular necrosis characterized by bone destruction/fragmentation and subarticular lucency in the bilateral femoral heads. Left shoulder x-ray showed degenerative changes in the left [facility] joint, no acute bony abnormality. Chest x-ray unremarkable.   In the ED, patient was treated with Dilaudid , Toradol  and Zofran . Because of severity of of pain, [facility] was consulted for inpatient management  Orthopedics consulted   Subjective: Patient was seen and examined this morning.  Pleasant elderly Caucasian female.  Lying down in bed.  Pain control with pain meds but anticipates pain on any movement and  hence not moving at all. Chart reviewed No fever.  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 Celena.  Pending consultation Sed rate and CRP elevated Pain control with Tylenol , oxycodone , Dilaudid  PRN PT/OT eval  Transaminitis Elevated AST, ALT and ALP level.   Denies history of alcohol use  Unremarkable acute hepatitis panel  Recent Labs  Lab 08/11/23 1230 08/12/23 0430  AST 42* 92*  ALT 50* 77*  ALKPHOS 169* 163*  BILITOT 0.7 0.5  PROT 7.2 6.0*  ALBUMIN  3.0* 2.5*  PLT 236 222   Sore throat dry cough lower Patient reported dry cough and sore throat for 1 to 2 weeks.  Respiratory virus panel unremarkable for COVID, flu, RSV. Continue Tessalon  andce cepacol as needed   Chronic diastolic heart failure preserved EF 70 to 75% Essential hypertension Blood pressure is well-controlled.  PTA meds- Toprol  50 mg daily, losartan  25 mg daily, spironolactone  25 mg daily, Jardiance  10 mg daily, Lasix  20 mg twice a week, Continue all.   Chronic sinus tachycardia Continue Toprol -XL 50 mg daily.   H/o CAD Continue Toprol -XL 50 mg daily and Crestor  10 mg daily   CKD stage II  Stable renal function.  Continue to monitor urine output and avoid nephrotoxic agents. Recent Labs  11/25/22 0235 11/26/22 0234 11/27/22 0459 12/14/22 1002 12/23/22 0951 12/29/22 1202 03/27/23 0951 07/27/23 0934 08/11/23 1230 08/12/23 0430  BUN 27* 36* 37* 12 21 16 21 18 17  26*  CREATININE 1.38* 1.29* 1.59* 0.70 0.76 0.85 0.99 1.11* 0.76 1.00   Generalized anxiety disorder Not currently on any home medication    Hypothyroidism Continue levothyroxine .   Gout Continue allopurinol  100 mg daily     Mobility: Needs PT eval  Goals of care   Code Status: Full Code     DVT prophylaxis:  heparin  injection 5,000 Units Start: 08/11/23 2200 SCDs Start: 08/11/23 1939   Antimicrobials: None Fluid: None Consultants: Orthopedics Family Communication: None at bedside  Status: Inpatient Level of care:  Telemetry Medical   Patient is from: Home Needs to continue in-hospital care: Further workup pending Anticipated d/c to: Pending clinical course      Diet:  Diet Order             Diet Heart Room service appropriate? Yes; Fluid consistency: Thin  Diet effective now                   Scheduled Meds:  allopurinol   100 mg Oral Daily   empagliflozin   10 mg Oral Daily   [START ON 08/14/2023] furosemide   20 mg Oral Once per day on Monday Thursday   heparin   5,000 Units Subcutaneous Q8H   ketorolac   10 mg Oral Q8H   levothyroxine   88 mcg Oral QAC breakfast   losartan   25 mg Oral Daily   metoprolol  succinate  50 mg Oral Daily   rosuvastatin   10 mg Oral Daily   sodium chloride  flush  3 mL Intravenous Q12H   spironolactone   25 mg Oral Daily    PRN meds: sodium chloride , acetaminophen  **OR** acetaminophen , benzonatate , menthol -cetylpyridinium, ondansetron  **OR** ondansetron  (ZOFRAN ) IV, oxyCODONE , polyethylene glycol, senna-docusate, sodium chloride  flush   Infusions:   sodium chloride       Antimicrobials: Anti-infectives (From admission, onward)    None       Objective: Vitals:   08/12/23 0449 08/12/23 0734  BP: 116/71 115/76  Pulse: 74 79  Resp: 18   Temp: (!) 97.5 F (36.4 C) (!) 97.5 F (36.4 C)  SpO2: 93% 95%   No intake or output data in the 24 hours ending 08/12/23 1130 Filed Weights   08/11/23 1714 08/11/23 2122 08/12/23 0449  Weight: 63.5 kg 63.1 kg 63.4 kg   Weight change:  Body mass index is 25.56 kg/m.   Physical Exam: General exam: Pleasant,  elderly Caucasian female Skin: No rashes, lesions or ulcers. HEENT: Atraumatic, normocephalic, no obvious bleeding Lungs: Clear to auscultation bilaterally,  CVS: S1, S2, no murmur,   GI/Abd: Soft, nontender, nondistended, bowel sound present,   CNS: Alert, awake motor x 3 Psychiatry: Mood appropriate,  Extremities: No pedal edema, no calf tenderness,   Data Review: I have personally reviewed the laboratory data and studies available.  F/u labs  Unresulted Labs (From admission, onward)     Start     Ordered   08/11/23 1934  Hemoglobin A1c  Once,   R        08/11/23 1933            Total time spent in review of labs and imaging, patient evaluation, formulation of plan, documentation and communication with family: 45 minutes  Signed, Chapman Rota, MD [facility] 08/12/2023

## 2023-08-12 NOTE — Evaluation (Signed)
 Physical Therapy Evaluation Patient Details Name: Kathleen Velazquez MRN: [id number] DOB: [birth year] Today's Date: 08/12/2023  History of Present Illness  Patient is 68 y.o. female presented to ED for Lt arm and Rt leg pain for 3-4 months with worsening in last 3 weeks. PMH  significant of chronic sinus tachycardia, essential hypertension, diastolic heart failure preserved EF, generalized anxiety disorder, chronic pericardial effusion, CAD, CKD stage II, distal left upper extremity and right lower extremity swelling which is chronic in nature. 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   Clinical Impression  Kathleen Velazquez is 68 y.o. female admitted with above HPI and diagnosis. Patient is currently limited by functional impairments below (see PT problem list). Patient lives with daughter and family and pt is typically mod ind with RW in home and scooter out of home at baseline. Has excellent support from daughter and her partner provides 24/7 Velazquez giving at home. Currently pt requires CGA for safety with sit<>stand transfers and was able to amb short bout in room to move bed>chair with RW. Patient will benefit from continued skilled PT interventions to address impairments and progress independence with mobility. Acute PT will follow and progress as able.         If plan is discharge home, recommend the following: A little help with walking and/or transfers;A little help with bathing/dressing/bathroom;Assistance with cooking/housework;Assist for transportation;Help with stairs or ramp for entrance   Can travel by private vehicle        Equipment Recommendations Other (comment) (possibly Lt platform RW)  Recommendations for Other Services       Functional Status Assessment Patient has had a recent decline in their functional status and demonstrates the ability to make significant improvements in  function in a reasonable and predictable amount of time.     Precautions / Restrictions Precautions Precautions: Fall Restrictions Weight Bearing Restrictions Per Provider Order: No      Mobility  Bed Mobility Overal bed mobility: Needs Assistance, Modified Independent Bed Mobility: Supine to Sit     Supine to sit: Supervision, Used rails, HOB elevated     General bed mobility comments: pt reliant on bed features and extra time    Transfers Overall transfer level: Needs assistance Equipment used: Rolling walker (2 wheels) Transfers: Sit to/from Stand Sit to Stand: Contact guard assist           General transfer comment: close CGA for safety with pt using Rt UE to power up and maitnain Lt UE in flexed position    Ambulation/Gait Ambulation/Gait assistance: Contact guard assist, Min assist Gait Distance (Feet): 8 Feet Assistive device: Rolling walker (2 wheels) Gait Pattern/deviations: Step-to pattern, Decreased step length - right, Decreased step length - left, Decreased stride length Gait velocity: decr     General Gait Details: overall CGA to begin steps but min assist to stabilize RW as pt uses Rt UE on front of walker due to inability to extend Lt UE enough to grasp walker bil.  Stairs            Wheelchair Mobility     Tilt Bed    Modified Rankin (Stroke Patients Only)       Balance Overall balance assessment: Needs assistance Sitting-balance support: No upper extremity supported, Feet supported Sitting balance-Leahy Scale: Good Sitting balance - Comments: EOB   Standing balance support: Single extremity supported, During functional activity Standing balance-Leahy Scale:  Fair Standing balance comment: able to stand with one hand on RW for support.                             Pertinent Vitals/Pain Pain Assessment Pain Assessment: Faces Faces Pain Scale: Hurts whole lot Pain Location: Lt shuolder, Rt LE Pain Descriptors /  Indicators: Grimacing, Guarding, Moaning Pain Intervention(s): Limited activity within patient's tolerance, Monitored during session, Repositioned    Home Living Family/patient expects to be discharged to:: Private residence Living Arrangements: Children;Other (Comment) (caregiver) Available Help at Discharge: Personal Velazquez attendant;Available PRN/intermittently;Available 24 hours/day;Family Type of Home: House Home Access: Stairs to enter Entrance Stairs-Rails: Right;Left;Can reach both Entrance Stairs-Number of Steps: 3 Alternate Level Stairs-Number of Steps: 2 Home Layout: Multi-level;Other (Comment) Home Equipment: Rolling Walker (2 wheels);Rollator (4 wheels);Cane - single point;Grab bars - tub/shower;Shower seat;Electric scooter Additional Comments: daughters partner is her personal caregiver, family lives upstairs and pt lives in basement apt    Prior Function Prior Level of Function : Needs assist             Mobility Comments: uses RW for mobility, RW and scooter for outside the home. ADLs Comments: Pt reports ind for ADLs, daughter and her partner help with meals.     Extremity/Trunk Assessment   Upper Extremity Assessment Upper Extremity Assessment: Defer to OT evaluation LUE Deficits / Details: stiff and painful shoulder, ABD to 70 degrees, less than full ROM elbow flexion, not able to touch face with L hand. Pt digits 1-5 little to no active flexion, in neutral position. limited with supination. LUE: Shoulder pain with ROM LUE Sensation: WNL LUE Coordination: decreased gross motor    Lower Extremity Assessment Lower Extremity Assessment: Generalized weakness;RLE deficits/detail       Communication   Communication Communication: No apparent difficulties  Cognition Arousal: Alert Behavior During Therapy: WFL for tasks assessed/performed Overall Cognitive Status: Within Functional Limits for tasks assessed                                           General Comments      Exercises     Assessment/Plan    PT Assessment Patient needs continued PT services  PT Problem List Decreased strength;Decreased range of motion;Decreased activity tolerance;Decreased balance;Decreased mobility;Decreased coordination;Decreased knowledge of use of DME;Decreased safety awareness;Decreased knowledge of precautions;Pain;Cardiopulmonary status limiting activity       PT Treatment Interventions DME instruction;Gait training;Stair training;Functional mobility training;Therapeutic activities;Therapeutic exercise;Balance training;Neuromuscular re-education;Cognitive remediation;Patient/family education    PT Goals (Current goals can be found in the Velazquez Plan section)  Acute Rehab PT Goals Patient Stated Goal: stop hurting and get home PT Goal Formulation: With patient Time For Goal Achievement: 08/26/23 Potential to Achieve Goals: Good    Frequency Min 1X/week     Co-evaluation               AM-PAC PT 6 Clicks Mobility  Outcome Measure Help needed turning from your back to your side while in a flat bed without using bedrails?: A Little Help needed moving from lying on your back to sitting on the side of a flat bed without using bedrails?: A Little Help needed moving to and from a bed to a chair (including a wheelchair)?: A Little Help needed standing up from a chair using your arms (e.g., wheelchair or bedside chair)?: A  Little Help needed to walk in hospital room?: A Little Help needed climbing 3-5 steps with a railing? : A Lot 6 Click Score: 17    End of Session Equipment Utilized During Treatment: Gait belt Activity Tolerance: Patient tolerated treatment well Patient left: in chair;with call bell/phone within reach;with chair alarm set Nurse Communication: Mobility status PT Visit Diagnosis: Unsteadiness on feet (R26.81);Other abnormalities of gait and mobility (R26.89);Muscle weakness (generalized) (M62.81);Difficulty in  walking, not elsewhere classified (R26.2);Pain Pain - Right/Left:  (bil) Pain - part of body: Shoulder;Hip;Leg (Lt shoulder, Rt Leg)    Time: 8991-8957 PT Time Calculation (min) (ACUTE ONLY): 34 min   Charges:   PT Evaluation $PT Eval Moderate Complexity: 1 Mod PT Treatments $Therapeutic Activity: 8-22 mins PT General Charges $$ ACUTE PT VISIT: 1 Visit         Vernell DONEEN KLEIN, DPT Acute Rehabilitation Services Office [phone]  08/12/23 5:11 PM

## 2023-08-12 NOTE — Plan of Care (Addendum)
  Problem: Education: Goal: Knowledge of General Education information will improve Description: Including pain rating scale, medication(s)/side effects and non-pharmacologic comfort measures Outcome: Progressing   Problem: Pain Management: Goal: General experience of comfort will improve Outcome: Progressing

## 2023-08-13 ENCOUNTER — Encounter (HOSPITAL_COMMUNITY): Payer: PPO

## 2023-08-13 ENCOUNTER — Encounter (HOSPITAL_COMMUNITY): Payer: Self-pay | Admitting: Internal Medicine

## 2023-08-13 DIAGNOSIS — M87059 Idiopathic aseptic necrosis of unspecified femur: Secondary | ICD-10-CM | POA: Diagnosis not present

## 2023-08-13 LAB — URIC ACID: Uric Acid, Serum: 4.5 mg/dL (ref 2.5–7.1)

## 2023-08-13 MED ORDER — PREDNISONE 20 MG PO TABS
30.0000 mg | ORAL_TABLET | Freq: Two times a day (BID) | ORAL | Status: DC
  Administered 2023-08-13 – 2023-08-16 (×6): 30 mg via ORAL
  Filled 2023-08-13 (×7): qty 1

## 2023-08-13 NOTE — Progress Notes (Signed)
 PROGRESS NOTE  Kathleen Velazquez  DOB: [birth year]  PCP: Orpha Yancey LABOR, MD FMW:[id number]  DOA: 08/11/2023  LOS: 2 days  Hospital Day: 3  Brief narrative: Kathleen Velazquez is a 68 y.o. female with PMH significant for HTN, CAD, chronic sinus tachycardia, chronic pericardial effusion, CKD, anxiety, IBS, h/o right sided frozen shoulder, distal left upper extremity and right lower extremity swelling which is chronic in nature. 1/3, patient presented to ED with complaint of left arm pain and right lower extremity pain for the last 3 weeks. States the pain in her left arm starts in her shoulder and radiates to her fingers. Feels like a sharp burning pain its constant. Denies any pain radiating in her chest and also denies shortness of breath. States the pain in her right leg started around the same time. Starts in the right hip and extends all the way down to the toes.  States she is reluctant to move her arm and leg because of the pain.  Denies any fall or trauma. On 12/20, she had ultrasound duplex arterial and venous of right lower extremity both of which were negative, ultrasound duplex venous of left upper extremity negative as well.   In the ED, patient was tachycardic to 110s, otherwise hemodynamically stable Labs with mildly elevated AST, ALT and alk phos X-ray of the right hip bilateral femoral head avascular necrosis characterized by bone destruction/fragmentation and subarticular lucency in the bilateral femoral heads. Left shoulder x-ray showed degenerative changes in the left [facility] joint, no acute bony abnormality.  Admitted to [facility] Orthopedics consulted MRI of both hips showed advanced bilateral femoral head osteonecrosis with early subchondral collapse of both femoral heads and mild secondary degenerative changes at both hips. MRI left shoulder showed tear of supraspinatus tendon.  Subjective: Patient was seen and examined this morning.   Sitting up in bed.  Not in distress.   Pain  partially controlled. Says he has a lot of issues going on at home Pleasant elderly Caucasian female.  Lying down in bed.  Pain control with pain meds but anticipates pain on any movement and hence not moving at all. Chart reviewed No fever.  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  Per orthopedics, patient would benefit from further referral to a total joint surgeon to evaluate for total hip arthroplasties. Left shoulder supraspinatus tendon tear -management per Ortho. Currently continued on pain management. Sed rate and CRP elevated. Pain control with Tylenol , oxycodone , Dilaudid  PRN PT/OT eval recommended home with PT.  Transaminitis Elevated AST, ALT and ALP level.   Denies history of alcohol use  Unremarkable acute hepatitis panel  Repeat labs tomorrow Recent Labs  Lab 08/11/23 1230 08/12/23 0430  AST 42* 92*  ALT 50* 77*  ALKPHOS 169* 163*  BILITOT 0.7 0.5  PROT 7.2 6.0*  ALBUMIN  3.0* 2.5*  PLT 236 222   Sore throat dry cough lower Patient reported dry cough and sore throat for 1 to 2 weeks.  Respiratory virus panel unremarkable for COVID, flu, RSV. Continue Tessalon  andce cepacol as needed   Chronic diastolic heart failure preserved EF 70 to 75% Essential hypertension Blood pressure is well-controlled.  PTA meds- Toprol  50 mg daily, losartan  25 mg daily, spironolactone   25 mg daily, Jardiance  10 mg daily, Lasix  20 mg twice a week, Continue all.   Chronic sinus tachycardia Continue Toprol -XL 50 mg daily.   H/o CAD Continue Toprol -XL 50 mg daily and Crestor  10 mg daily   CKD stage II  Stable renal function.  Continue to monitor urine  output and avoid nephrotoxic agents. Recent Labs    11/25/22 0235 11/26/22 0234 11/27/22 0459 12/14/22 1002 12/23/22 0951 12/29/22 1202 03/27/23 0951 07/27/23 0934 08/11/23 1230 08/12/23 0430  BUN 27* 36* 37* 12 21 16 21 18 17  26*  CREATININE 1.38* 1.29* 1.59* 0.70 0.76 0.85 0.99 1.11* 0.76 1.00   Generalized anxiety disorder Not currently on any home medication   Hypothyroidism Continue levothyroxine .   Gout Continue allopurinol  100 mg daily     Mobility: Needs PT eval  Goals of care   Code Status: Full Code     DVT prophylaxis:  heparin  injection 5,000 Units Start: 08/11/23 2200 SCDs Start: 08/11/23 1939   Antimicrobials: None Fluid: None Consultants: Orthopedics Family Communication: None at bedside  Status: Inpatient Level of care:  Telemetry Medical   Patient is from: Home Needs to continue in-hospital care: Orthopedic workup ongoing with.  Home with PT recommended  Anticipated d/c to: Pending clinical course      Diet:  Diet Order             Diet Heart Room service appropriate? Yes; Fluid consistency: Thin  Diet effective now                   Scheduled Meds:  allopurinol   100 mg Oral Daily   empagliflozin   10 mg Oral Daily   [START ON 08/14/2023] furosemide   20 mg Oral Once per day on Monday Thursday   heparin   5,000 Units Subcutaneous Q8H   ketorolac   10 mg Oral Q8H   levothyroxine   88 mcg Oral QAC breakfast   losartan   25 mg Oral Daily   metoprolol  succinate  50 mg Oral Daily   rosuvastatin   10 mg Oral Daily   sodium chloride  flush  3 mL Intravenous Q12H   spironolactone   25 mg Oral Daily    PRN meds: acetaminophen  **OR** acetaminophen , benzonatate , menthol -cetylpyridinium, ondansetron  **OR** ondansetron  (ZOFRAN ) IV, oxyCODONE , polyethylene glycol, senna-docusate, sodium chloride  flush   Infusions:     Antimicrobials: Anti-infectives (From admission, onward)    None       Objective: Vitals:   08/13/23 0421  08/13/23 0753  BP: 98/61 123/64  Pulse: 61 81  Resp: 18 18  Temp: 97.8 F (36.6 C) 98.7 F (37.1 C)  SpO2: 93% 96%    Intake/Output Summary (Last 24 hours) at 08/13/2023 1456 Last data filed at 08/13/2023 0319 Gross per 24 hour  Intake 237 ml  Output --  Net 237 ml   Filed Weights   08/11/23 2122 08/12/23 0449 08/13/23 0500  Weight: 63.1 kg 63.4 kg 64.2 kg   Weight change: 0.7 kg Body mass index is 25.89 kg/m.   Physical Exam: General exam: Pleasant, elderly Caucasian female.  Pain partially controlled Skin: No rashes, lesions or ulcers. HEENT: Atraumatic, normocephalic, no obvious bleeding Lungs: Clear to auscultation bilaterally,  CVS: S1, S2, no murmur,   GI/Abd: Soft, nontender, nondistended, bowel sound present,   CNS: Alert, awake motor x 3 Psychiatry: Mood appropriate,  Extremities: No pedal edema, no calf tenderness,   Data Review: I have personally reviewed the laboratory data and studies available.  F/u labs  Unresulted Labs (From admission,  onward)     Start     Ordered   08/14/23 0500  CBC with Differential/Platelet  Tomorrow morning,   R        08/13/23 0910   08/14/23 0500  Comprehensive metabolic panel  Tomorrow morning,   R        08/13/23 0910   08/11/23 1934  Hemoglobin A1c  Once,   R        08/11/23 1933            Total time spent in review of labs and imaging, patient evaluation, formulation of plan, documentation and communication with family: 45 minutes  Signed, Chapman Rota, MD [facility] 08/13/2023

## 2023-08-13 NOTE — Plan of Care (Signed)
  Problem: Safety: Goal: Ability to remain free from injury will improve Outcome: Not Progressing   Problem: Pain Management: Goal: General experience of comfort will improve Outcome: Not Progressing   Problem: Elimination: Goal: Will not experience complications related to bowel motility Outcome: Not Progressing

## 2023-08-13 NOTE — Plan of Care (Signed)
  Problem: Activity: Goal: Risk for activity intolerance will decrease Outcome: Progressing   Problem: Pain Management: Goal: General experience of comfort will improve Outcome: Progressing

## 2023-08-14 ENCOUNTER — Inpatient Hospital Stay (HOSPITAL_COMMUNITY): Payer: PPO

## 2023-08-14 DIAGNOSIS — M87059 Idiopathic aseptic necrosis of unspecified femur: Secondary | ICD-10-CM

## 2023-08-14 DIAGNOSIS — M79673 Pain in unspecified foot: Secondary | ICD-10-CM

## 2023-08-14 LAB — CBC WITH DIFFERENTIAL/PLATELET
Abs Immature Granulocytes: 0.04 10*3/uL (ref 0.00–0.07)
Basophils Absolute: 0 10*3/uL (ref 0.0–0.1)
Basophils Relative: 0 %
Eosinophils Absolute: 0 10*3/uL (ref 0.0–0.5)
Eosinophils Relative: 0 %
HCT: 39.8 % (ref 36.0–46.0)
Hemoglobin: 13.1 g/dL (ref 12.0–15.0)
Immature Granulocytes: 1 %
Lymphocytes Relative: 8 %
Lymphs Abs: 0.6 10*3/uL — ABNORMAL LOW (ref 0.7–4.0)
MCH: 30 pg (ref 26.0–34.0)
MCHC: 32.9 g/dL (ref 30.0–36.0)
MCV: 91.1 fL (ref 80.0–100.0)
Monocytes Absolute: 0 10*3/uL — ABNORMAL LOW (ref 0.1–1.0)
Monocytes Relative: 1 %
Neutro Abs: 6.5 10*3/uL (ref 1.7–7.7)
Neutrophils Relative %: 90 %
Platelets: 283 10*3/uL (ref 150–400)
RBC: 4.37 MIL/uL (ref 3.87–5.11)
RDW: 14.5 % (ref 11.5–15.5)
WBC: 7.2 10*3/uL (ref 4.0–10.5)
nRBC: 0 % (ref 0.0–0.2)

## 2023-08-14 LAB — COMPREHENSIVE METABOLIC PANEL
ALT: 137 U/L — ABNORMAL HIGH (ref 0–44)
AST: 93 U/L — ABNORMAL HIGH (ref 15–41)
Albumin: 2.8 g/dL — ABNORMAL LOW (ref 3.5–5.0)
Alkaline Phosphatase: 324 U/L — ABNORMAL HIGH (ref 38–126)
Anion gap: 10 (ref 5–15)
BUN: 16 mg/dL (ref 8–23)
CO2: 22 mmol/L (ref 22–32)
Calcium: 9 mg/dL (ref 8.9–10.3)
Chloride: 105 mmol/L (ref 98–111)
Creatinine, Ser: 0.84 mg/dL (ref 0.44–1.00)
GFR, Estimated: >60 mL/min (ref >60)
Glucose, Bld: 186 mg/dL — ABNORMAL HIGH (ref 70–99)
Potassium: 4.2 mmol/L (ref 3.5–5.1)
Sodium: 137 mmol/L (ref 135–145)
Total Bilirubin: 0.6 mg/dL (ref 0.0–1.2)
Total Protein: 6.6 g/dL (ref 6.5–8.1)

## 2023-08-14 LAB — VAS US ABI WITH/WO TBI
Left ABI: 1.12
Right ABI: 1.11

## 2023-08-14 LAB — HEMOGLOBIN A1C
Hgb A1c MFr Bld: 5.6 % (ref 4.8–5.6)
Mean Plasma Glucose: 114 mg/dL

## 2023-08-14 MED ORDER — POLYETHYLENE GLYCOL 3350 17 G PO PACK
17.0000 g | PACK | Freq: Two times a day (BID) | ORAL | Status: AC
  Administered 2023-08-14 – 2023-08-15 (×2): 17 g via ORAL
  Filled 2023-08-14 (×3): qty 1

## 2023-08-14 MED ORDER — ORAL CARE MOUTH RINSE
15.0000 mL | OROMUCOSAL | Status: DC | PRN
Start: 2023-08-14 — End: 2023-08-16

## 2023-08-14 NOTE — Progress Notes (Signed)
 [facility] PROGRESS NOTE    Progress Note  Kathleen Velazquez  FMW:[id number] DOB: 05-Dec-1955 DOA: 08/11/2023 PCP: Orpha Yancey LABOR, MD     Brief Narrative:   Kathleen Velazquez is an 68 y.o. female past medical history significant for essential hypertension CAD chronic sinus tachycardia, essential hypertension, chronic diastolic dysfunction, chronic pericardial effusion, CAD, chronic kidney disease stage II distal left upper extremity and right lower extremity swelling which is chronic, presents to the ED on 08/10/2022 for left arm pain and right lower extremity pain that started about 3 weeks prior to admission, she denies any fall.  ABIs and lower extremity Dopplers have been negative.  MRI showed bilateral femoral head osteonecrosis and collapse of both femoral heads, left shoulder x-ray showed degenerative changes to the left [facility] joint orthopedic surgery was consulted.  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 Continue narcotics for pain management, started on a bowel regiment. ESR and CRP are elevated. PT evaluated the patient, will need skilled nursing facility placement  Left shoulder supraspinatus tendons tear Further management per orthopedic surgery.  Elevated LFTs: Have remained relatively stable, lites phosphatase is raising likely due to out bilateral femoral heads necrosis. Denies any abdominal pain hepatitis panel is unremarkable.  Sore throat dry cough: Respiratory panel were unremarkable. Continue Tessalon  Perles.  Chronic diastolic heart failure/essential hypertension: Continue metoprolol , losartan , Aldactone , Jardiance  and Lasix  (Lasix  is twice a week).  Chronic sinus tachycardia: Continue Toprol  his rate is controlled.  History of CAD: Continue statin  metoprolol .  Chronic kidney disease stage II: Noted.  General anxiety disorder: Not on any medications at home.  Hypothyroidism: Continue Synthroid .  Gout: Continue allopurinol .   DVT prophylaxis: lovenox  Family Communication:none Status is: Inpatient Remains inpatient appropriate because: Awaiting placement    Code Status:     Code Status Orders  (From admission, onward)           Start     Ordered   08/11/23 1931  Full code  Continuous       Question:  By:  Answer:  Consent: discussion documented in EHR   08/11/23 1931           Code Status History     Date Active Date Inactive Code Status Order ID Comments User Context   11/18/2022 2249 11/27/2022 1927 Full Code [id number]  Donia Martina NOVAK, DO ED   02/26/2020 1700 02/28/2020 1722 Full Code [id number]  Reena Roxie CROME, [place] Inpatient   07/27/2016 1858 08/01/2016 1604 Full Code [id number]  Jadine Toribio SQUIBB, MD Inpatient   11/28/2011 1221 12/03/2011 1357 Full Code [id number]  Johnie Lanning Olds, RN Inpatient         IV Access:   Peripheral IV   Procedures and diagnostic studies:   DG Elbow 2 Views Left Result Date: 08/12/2023 CLINICAL DATA:  Pain, no known injury EXAM: LEFT ELBOW - 2 VIEW; LEFT FOREARM - 2 VIEW COMPARISON:  None Available. FINDINGS: There is no evidence of fracture, dislocation, or joint effusion. There is no evidence of arthropathy or other focal bone abnormality. Soft tissues are unremarkable. IMPRESSION: No fracture or dislocation of the left elbow or left forearm. No radiographic findings to explain pain. Electronically Signed   By: Marolyn JONETTA Jaksch M.D.   On: 08/12/2023 16:55   DG Forearm Left Result  Date: 08/12/2023 CLINICAL DATA:  Pain, no known injury EXAM: LEFT ELBOW - 2 VIEW; LEFT FOREARM - 2 VIEW COMPARISON:  None Available. FINDINGS: There is no evidence of fracture, dislocation, or joint effusion. There is no evidence of arthropathy or other focal bone abnormality. Soft  tissues are unremarkable. IMPRESSION: No fracture or dislocation of the left elbow or left forearm. No radiographic findings to explain pain. Electronically Signed   By: Marolyn JONETTA Jaksch M.D.   On: 08/12/2023 16:55   MR SHOULDER LEFT WO CONTRAST Result Date: 08/12/2023 CLINICAL DATA:  Shoulder pain, chronic, inflammatory arthritis suspected, xray done EXAM: MRI OF THE LEFT SHOULDER WITHOUT CONTRAST TECHNIQUE: Multiplanar, multisequence MR imaging of the shoulder was performed. No intravenous contrast was administered. COMPARISON:  Radiographs 08/11/2023 FINDINGS: Rotator cuff: Focal intrasubstance insertional tear of the anterior leading edge of the supraspinatus tendon extends to the bursal surface. The articular surface fibers appear intact, and there is no full-thickness tendon tear or tendon retraction. The infraspinatus, subscapularis and teres minor tendons appear normal. Muscles: Mild nonspecific diffuse periarticular edema which involves the rotator cuff musculature. No focal muscular atrophy. Biceps long head:  Intact and normally positioned. Acromioclavicular Joint: The acromion is type 1. Mild acromioclavicular degenerative changes with marrow edema in the distal clavicle and adjacent acromion. No AC joint widening or osteolysis. There is a small to moderate amount of fluid in the subacromial-subdeltoid bursa. Glenohumeral Joint: No significant shoulder joint effusion or glenohumeral arthropathy. Labrum: Labral assessment limited by the lack of joint fluid.No evidence of labral tear or paralabral cyst. Bones: No evidence of acute fracture, dislocation or osteomyelitis. No glenohumeral subchondral edema or erosive changes. Other: Mild nonspecific periarticular soft tissue edema without focal fluid collection. IMPRESSION: 1. Focal intrasubstance insertional tear of the anterior leading edge of the supraspinatus tendon extending to the bursal surface. No full-thickness tendon tear or tendon retraction. 2.  Mild acromioclavicular degenerative changes with marrow edema in the distal clavicle and adjacent acromion. 3. Small to moderate amount of fluid in the subacromial-subdeltoid bursa suggesting bursitis. 4. Mild nonspecific diffuse periarticular edema which involves the rotator cuff musculature. This is nonspecific, although could reflect inflammation. Correlate clinically. 5. The additional components of the rotator cuff, biceps tendon and labrum appear intact. 6. No acute osseous findings or significant glenohumeral arthropathy. Electronically Signed   By: Elsie Perone M.D.   On: 08/12/2023 12:24     Medical Consultants:   None.   Subjective:    MARINDA TYER no complaints pain is controlled  Objective:    Vitals:   08/13/23 1509 08/13/23 2037 08/14/23 0344 08/14/23 0746  BP: 102/67 105/60 130/78 113/71  Pulse: 73 78 87 78  Resp: 17 16 16 16   Temp: 98.6 F (37 C) 98.1 F (36.7 C) 98.4 F (36.9 C) 98.2 F (36.8 C)  TempSrc:   Oral   SpO2: 94% 96% 98% 97%  Weight:      Height:       SpO2: 97 %   Intake/Output Summary (Last 24 hours) at 08/14/2023 0938 Last data filed at 08/13/2023 1957 Gross per 24 hour  Intake 200 ml  Output --  Net 200 ml   Filed Weights   08/11/23 2122 08/12/23 0449 08/13/23 0500  Weight: 63.1 kg 63.4 kg 64.2 kg    Exam: General exam: In no acute distress. Respiratory system: Good air movement and clear to auscultation. Cardiovascular system: S1 & S2 heard, RRR. No JVD. Gastrointestinal system: Abdomen is nondistended, soft and nontender.  Skin: No rashes, lesions or ulcers Psychiatry: Judgement and insight appear normal. Mood & affect appropriate.    Data Reviewed:    Labs: Basic Metabolic Panel: Recent Labs  Lab 08/11/23 1230 08/12/23 0430 08/14/23 0500  NA 138 140 137  K 3.8 4.1 4.2  CL 105 109 105  CO2 23 24 22   GLUCOSE 212* 117* 186*  BUN 17 26* 16  CREATININE 0.76 1.00 0.84  CALCIUM  9.1 8.6* 9.0   GFR Estimated  Creatinine Clearance: 55.6 mL/min (by C-G formula based on SCr of 0.84 mg/dL). Liver Function Tests: Recent Labs  Lab 08/11/23 1230 08/12/23 0430 08/14/23 0500  AST 42* 92* 93*  ALT 50* 77* 137*  ALKPHOS 169* 163* 324*  BILITOT 0.7 0.5 0.6  PROT 7.2 6.0* 6.6  ALBUMIN  3.0* 2.5* 2.8*   No results for input(s): LIPASE, AMYLASE in the last 168 hours. No results for input(s): AMMONIA in the last 168 hours. Coagulation profile No results for input(s): INR, PROTIME in the last 168 hours. COVID-19 Labs  Recent Labs    08/11/23 2035  CRP 7.9*    Lab Results  Component Value Date   SARSCOV2NAA NEGATIVE 08/11/2023   SARSCOV2NAA NEGATIVE 02/22/2020    CBC: Recent Labs  Lab 08/11/23 1230 08/12/23 0430 08/14/23 0500  WBC 6.3 5.9 7.2  NEUTROABS  --   --  6.5  HGB 12.4 11.4* 13.1  HCT 38.4 34.8* 39.8  MCV 92.1 91.6 91.1  PLT 236 222 283   Cardiac Enzymes: No results for input(s): CKTOTAL, CKMB, CKMBINDEX, TROPONINI in the last 168 hours. BNP (last 3 results) No results for input(s): PROBNP in the last 8760 hours. CBG: No results for input(s): GLUCAP in the last 168 hours. D-Dimer: No results for input(s): DDIMER in the last 72 hours. Hgb A1c: No results for input(s): HGBA1C in the last 72 hours. Lipid Profile: No results for input(s): CHOL, HDL, LDLCALC, TRIG, CHOLHDL, LDLDIRECT in the last 72 hours. Thyroid  function studies: No results for input(s): TSH, T4TOTAL, T3FREE, THYROIDAB in the last 72 hours.  Invalid input(s): FREET3 Anemia work up: No results for input(s): VITAMINB12, FOLATE, FERRITIN, TIBC, IRON , RETICCTPCT in the last 72 hours. Sepsis Labs: Recent Labs  Lab 08/11/23 1230 08/12/23 0430 08/14/23 0500  WBC 6.3 5.9 7.2   Microbiology Recent Results (from the past 240 hours)  Resp panel by RT-PCR (RSV, Flu A&B, Covid) Anterior Nasal Swab     Status: None   Collection Time: 08/11/23 12:21 PM    Specimen: Anterior Nasal Swab  Result Value Ref Range Status   SARS Coronavirus 2 by RT PCR NEGATIVE NEGATIVE Final   Influenza A by PCR NEGATIVE NEGATIVE Final   Influenza B by PCR NEGATIVE NEGATIVE Final    Comment: (NOTE) The Xpert Xpress SARS-CoV-2/FLU/RSV plus assay is intended as an aid in the diagnosis of influenza from Nasopharyngeal swab specimens and should not be used as a sole basis for treatment. Nasal washings and aspirates are unacceptable for Xpert Xpress SARS-CoV-2/FLU/RSV testing.  Fact Sheet for Patients: bloggercourse.com  Fact Sheet for Healthcare Providers: seriousbroker.it  This test is not yet approved or cleared by the [place]  FDA and has been authorized for detection and/or diagnosis of SARS-CoV-2 by FDA under an Emergency Use Authorization (EUA). This EUA will remain in effect (meaning this test can be used) for the duration of the COVID-19 declaration under Section 564(b)(1) of the Act, 21 U.S.C. section 360bbb-3(b)(1), unless the authorization is terminated or revoked.  Resp Syncytial Virus by PCR NEGATIVE NEGATIVE Final    Comment: (NOTE) Fact Sheet for Patients: bloggercourse.com  Fact Sheet for Healthcare Providers: seriousbroker.it  This test is not yet approved or cleared by the [place]  FDA and has been authorized for detection and/or diagnosis of SARS-CoV-2 by FDA under an Emergency Use Authorization (EUA). This EUA will remain in effect (meaning this test can be used) for the duration of the COVID-19 declaration under Section 564(b)(1) of the Act, 21 U.S.C. section 360bbb-3(b)(1), unless the authorization is terminated or revoked.  Performed at [facility], [address] [place], [place] [address]      Medications:    allopurinol   100 mg Oral Daily   empagliflozin   10 mg Oral Daily   furosemide   20 mg Oral  Once per day on Monday Thursday   heparin   5,000 Units Subcutaneous Q8H   ketorolac   10 mg Oral Q8H   levothyroxine   88 mcg Oral QAC breakfast   losartan   25 mg Oral Daily   metoprolol  succinate  50 mg Oral Daily   predniSONE   30 mg Oral BID WC   rosuvastatin   10 mg Oral Daily   sodium chloride  flush  3 mL Intravenous Q12H   spironolactone   25 mg Oral Daily   Continuous Infusions:    LOS: 3 days   Erle Odell Castor  [facility]  08/14/2023, 9:38 AM

## 2023-08-14 NOTE — Progress Notes (Signed)
 Occupational Therapy Treatment Patient Details Name: Kathleen Velazquez MRN: [id number] DOB: [birth year] Today's Date: 08/14/2023   History of present illness Patient is 68 y.o. female presented to ED for Lt arm and Rt leg pain for 3-4 months with worsening in last 3 weeks. 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 L shoulder MRI with partial tear of supraspinatus. PMH  significant of chronic sinus tachycardia, essential hypertension, diastolic heart failure preserved EF, generalized anxiety disorder, chronic pericardial effusion, CAD, CKD stage II, distal left upper extremity and right lower extremity swelling which is chronic in nature.   OT comments  Patient in recliner and agreeable to OT.  Improving functional use of L UE, provided exercises as below.  Reports painful elbow extension and thumb movement but encouraged to tolerance movement throughout the day.  Patient completing ADLs with up to min assist, min guard for transfers and mobility using RW. Continue to recommend HHOT services at dc. Will follow acutely.       If plan is discharge home, recommend the following:  A little help with walking and/or transfers;A little help with bathing/dressing/bathroom;Assistance with cooking/housework;Assist for transportation;Help with stairs or ramp for entrance   Equipment Recommendations  None recommended by OT    Recommendations for Other Services      Precautions / Restrictions Precautions Precautions: Fall Restrictions Weight Bearing Restrictions Per Provider Order: No       Mobility Bed Mobility               General bed mobility comments: OOB in recliner    Transfers Overall transfer level: Needs assistance Equipment used: Rolling walker (2 wheels) Transfers: Sit to/from Stand Sit to Stand: Contact guard assist           General transfer comment: cueing for hand placement  and safety     Balance Overall balance assessment: Needs assistance Sitting-balance support: No upper extremity supported, Feet supported Sitting balance-Leahy Scale: Good     Standing balance support: Bilateral upper extremity supported, During functional activity, No upper extremity supported Standing balance-Leahy Scale: Fair                             ADL either performed or assessed with clinical judgement   ADL Overall ADL's : Needs assistance/impaired     Grooming: Set up;Sitting           Upper Body Dressing : Minimal assistance;Sitting   Lower Body Dressing: Minimal assistance;Sit to/from stand   Toilet Transfer: Contact guard assist;Ambulation;Rolling walker (2 wheels)           Functional mobility during ADLs: Contact guard assist;Rolling walker (2 wheels)      Extremity/Trunk Assessment Upper Extremity Assessment Upper Extremity Assessment: LUE deficits/detail LUE Deficits / Details: improving ROM, remains stiff and painful at elbow and thumb.  Able to make 60% full fist, -15-10 degress full elbow extension, and able to supination/pronate; shoulder flexion to 90* LUE Sensation: WNL LUE Coordination: decreased fine motor;decreased gross motor   Lower Extremity Assessment Lower Extremity Assessment: Defer to PT evaluation        Vision       Perception     Praxis      Cognition Arousal: Alert Behavior During Therapy: [facility] for tasks assessed/performed Overall Cognitive Status: Within Functional Limits for tasks assessed  General Comments: not formally assessed, but appears [facility]        Exercises Exercises: Other exercises Other Exercises Other Exercises: x 5 reps: shoudler flexion, shoulder abduction, eblow flexion/extension, supination/pronation, wrist flexion/extension, and hand flexion/extension to tolerance ROM    Shoulder Instructions       General Comments provided exercise  handout and yellow squeeze foam block    Pertinent Vitals/ Pain       Pain Assessment Pain Assessment: Faces Faces Pain Scale: Hurts little more Pain Location: L shoulder, bil hips Pain Descriptors / Indicators: Grimacing, Guarding, Moaning Pain Intervention(s): Limited activity within patient's tolerance, Monitored during session, Repositioned  Home Living                                          Prior Functioning/Environment              Frequency  Min 1X/week        Progress Toward Goals  OT Goals(current goals can now be found in the care plan section)  Progress towards OT goals: Progressing toward goals  Acute Rehab OT Goals Patient Stated Goal: get better OT Goal Formulation: With patient Time For Goal Achievement: 08/26/23 Potential to Achieve Goals: Good  Plan      Co-evaluation                 AM-PAC OT 6 Clicks Daily Activity     Outcome Measure   Help from another person eating meals?: A Little Help from another person taking care of personal grooming?: A Little Help from another person toileting, which includes using toliet, bedpan, or urinal?: A Little Help from another person bathing (including washing, rinsing, drying)?: A Little Help from another person to put on and taking off regular upper body clothing?: A Little Help from another person to put on and taking off regular lower body clothing?: A Little 6 Click Score: 18    End of Session Equipment Utilized During Treatment: Rolling walker (2 wheels)  OT Visit Diagnosis: Unsteadiness on feet (R26.81);Other abnormalities of gait and mobility (R26.89);Muscle weakness (generalized) (M62.81);Pain Pain - part of body:  (L arm)   Activity Tolerance Patient tolerated treatment well   Patient Left in chair;with call bell/phone within reach;with chair alarm set   Nurse Communication Mobility status        Time: 8841-8774 OT Time Calculation (min): 27 min  Charges:  OT General Charges $OT Visit: 1 Visit OT Treatments $Self Care/Home Management : 8-22 mins $Therapeutic Exercise: 8-22 mins  Kathleen Velazquez, OT Acute Rehabilitation Services Office [phone]   Kathleen Velazquez [place] 08/14/2023, 2:02 PM

## 2023-08-14 NOTE — Progress Notes (Addendum)
 Orthopaedic Trauma Service Progress Note  Patient ID: Kathleen Velazquez MRN: [id number] DOB/AGE: 70-01-1956 70 y.o.  Subjective:  Feeling better Left shoulder motion improved Started on oral prednisone  yesterday  No significant plaints with respect to her bilateral hips today  MRI of the shoulder was notable for partial rotator cuff tear of her supraspinatus.  Majority of the muscle and tendon appears to be intact.  No retraction  ABIs pending   ROS As above  Objective:   VITALS:   Vitals:   08/13/23 1509 08/13/23 2037 08/14/23 0344 08/14/23 0746  BP: 102/67 105/60 130/78 113/71  Pulse: 73 78 87 78  Resp: 17 16 16 16   Temp: 98.6 F (37 C) 98.1 F (36.7 C) 98.4 F (36.9 C) 98.2 F (36.8 C)  TempSrc:   Oral   SpO2: 94% 96% 98% 97%  Weight:      Height:        Estimated body mass index is 25.89 kg/m as calculated from the following:   Height as of this encounter: 5' 2 (1.575 m).   Weight as of this encounter: 64.2 kg.   Intake/Output      01/05 0701 01/06 0700 01/06 0701 01/07 0700   P.O. 200    Total Intake(mL/kg) 200 (3.1)    Net +200         Urine Occurrence 1 x      LABS  Results for orders placed or performed during the hospital encounter of 08/11/23 (from the past 24 hours)  CBC with Differential/Platelet     Status: Abnormal   Collection Time: 08/14/23  5:00 AM  Result Value Ref Range   WBC 7.2 4.0 - 10.5 K/uL   RBC 4.37 3.87 - 5.11 MIL/uL   Hemoglobin 13.1 12.0 - 15.0 g/dL   HCT 60.1 63.9 - 53.9 %   MCV 91.1 80.0 - 100.0 fL   MCH 30.0 26.0 - 34.0 pg   MCHC 32.9 30.0 - 36.0 g/dL   RDW 85.4 88.4 - 84.4 %   Platelets 283 150 - 400 K/uL   nRBC 0.0 0.0 - 0.2 %   Neutrophils Relative % 90 %   Neutro Abs 6.5 1.7 - 7.7 K/uL   Lymphocytes Relative 8 %   Lymphs Abs 0.6 (L) 0.7 - 4.0 K/uL   Monocytes Relative 1 %   Monocytes Absolute 0.0 (L) 0.1 - 1.0 K/uL   Eosinophils  Relative 0 %   Eosinophils Absolute 0.0 0.0 - 0.5 K/uL   Basophils Relative 0 %   Basophils Absolute 0.0 0.0 - 0.1 K/uL   Immature Granulocytes 1 %   Abs Immature Granulocytes 0.04 0.00 - 0.07 K/uL  Comprehensive metabolic panel     Status: Abnormal   Collection Time: 08/14/23  5:00 AM  Result Value Ref Range   Sodium 137 135 - 145 mmol/L   Potassium 4.2 3.5 - 5.1 mmol/L   Chloride 105 98 - 111 mmol/L   CO2 22 22 - 32 mmol/L   Glucose, Bld 186 (H) 70 - 99 mg/dL   BUN 16 8 - 23 mg/dL   Creatinine, Ser 9.15 0.44 - 1.00 mg/dL   Calcium  9.0 8.9 - 10.3 mg/dL   Total Protein 6.6 6.5 - 8.1 g/dL   Albumin  2.8 (L) 3.5 - 5.0 g/dL   AST  93 (H) 15 - 41 U/L   ALT 137 (H) 0 - 44 U/L   Alkaline Phosphatase 324 (H) 38 - 126 U/L   Total Bilirubin 0.6 0.0 - 1.2 mg/dL   GFR, Estimated >39 >39 mL/min   Anion gap 10 5 - 15     PHYSICAL EXAM:    Gen: sitting up in chair, very pleasant, looks good, NAD  Ext:  Left upper extremity Swelling much improved to L UEx and some muscle wasting to the distal aspect of her left upper extremity  Tolerates a significant amount of shoulder motion passively and it is relatively pain-free  She is moving her digits with much more ease.  Elbow is still little stiff but moves much better than it did 2 days ago Radial, ulnar, median nerve motor and sensory functions are grossly intact distally although she appears to be hypersensitive with light touch Palpable radial pulse is noted good perfusion distally.  Extremity is warm.  Good coloration to the distal extremity   Bilateral lower extremities  Decreased discomfort with evaluation of her hips + DP pulses are noted Skin color changes noted bilaterally these are symmetric, ?  Hemosiderin staining  Her feet are not as discolored as they were 2 days ago.  Suspect this is because she has had her legs elevated Skin has appearance of chronic peripheral vascular disease Well-healed right total knee incision  noted Diffuse sarcopenia to bilateral lower extremities No open or traumatic wounds noted DPN, SPN, TN sensory functions are grossly intact bilaterally and symmetric EHL, FHL, lesser toe motor functions are grossly intact bilaterally and are symmetric Ankle flexion, extension, inversion eversion are grossly intact    Assessment/Plan:     Principal Problem:   Avascular necrosis of bone of hip (HCC) Active Problems:   Hyperlipidemia   Left shoulder pain   Chronic diastolic CHF (congestive heart failure) (HCC)   History of CAD (coronary artery disease)   GAD (generalized anxiety disorder)   Hypothyroidism   Avascular necrosis of bone of hip, unspecified laterality (HCC)   Essential hypertension   Transaminitis   CKD (chronic kidney disease) stage 2, GFR 60-89 ml/min   Anti-infectives (From admission, onward)    None     .  POD/HD#: 58  68 year old female with acute right hip pain and left shoulder pain   -Polyarthralgias including right hip pain and left shoulder pain               MRI confirms presence of bilateral hip AVN.  This is likely due to her extensive alcohol abuse in the past.  She would benefit for referral to a total joint surgeon to evaluate for total hip arthroplasties.  Continue with symptomatic management for the time being.  Can try topical anti-inflammatories or systemic anti-inflammatories.  Activity as tolerated.  Weight-bear as tolerated bilateral lower extremities                 Left shoulder pain-MRI does not show AVN of her left humeral head but does show a partial tear of her supraspinatus tendon as well as some localized inflammation around her shoulder joint.  Her symptoms have already appear to have responded to scheduled ketorolac .  Will also start her on a 15-day prednisone  taper as follows   Prednisone  10 mg 3 tablets by mouth every 12 hours for 5 days then   Prednisone  10 mg 2 tablets by mouth every 12 hours for 5 days then   Prednisone  10 mg 1  tablet by mouth every 12 hours for 3 days then   Prednisone  10 mg 1 tablet daily for 2 days    Would then recommend outpatient PT for her left shoulder to recover function    Patient denies any acute falls or trauma to her left shoulder therefore suspect that this is related to degenerative wear and tear.  Her subacromial space does not appear to be too narrow.                 Bilateral foot and ankle pain                             Concerned that there may be a vascular component given her skin changes.  There could also be a nerve component as well.  Will start with ABIs and proceed accordingly based off results---> ABIs are pending   - Pain management:               Multimodal                             Ice, heat                             Nsaids                              Opioids for severe pain    - Dispo:               Follow up on studies               Continue with current care               Will need outpt follow up with total joint surgeon ([facility] is [facility])   Suspect we should be able to discharge her in the next 48 hours or so provided she is able to manage at home.  She lives with her daughter  Francis MICAEL Deward DEVONNA [phone] (C) 08/14/2023, 11:17 AM  [facility] [address] [place] [address] [phone] MAXIMINO MILLING (F)    After 5pm and on the weekends please log on to Amion, go to orthopaedics and the look under the [facility] Call for the provider(s) on call. You can also call our office at [phone] and then follow the prompts to be connected to the call team.  Patient ID: Kathleen Velazquez, female   DOB: [birth year], 68 y.o.   MRN: [id number]

## 2023-08-14 NOTE — Care Management Important Message (Signed)
 Important Message  Patient Details  Name: Kathleen Velazquez MRN: [id number] Date of Birth: [birth year]   Important Message Given:  Yes - Medicare IM     Dorena Bodo 08/14/2023, 12:11 PM

## 2023-08-14 NOTE — Progress Notes (Signed)
 Mobility Specialist: Progress Note   08/14/23 1549  Mobility  Activity Ambulated with assistance in room  Level of Assistance Contact guard assist, steadying assist  Assistive Device Front wheel walker  Distance Ambulated (ft) 70 ft  Activity Response Tolerated well  Mobility Referral Yes  Mobility visit 1 Mobility  Mobility Specialist Start Time (ACUTE ONLY) 1522  Mobility Specialist Stop Time (ACUTE ONLY) 1540  Mobility Specialist Time Calculation (min) (ACUTE ONLY) 18 min    Pt was agreeable to mobility session - received in chair. Demonstrated UE exercises given by OT. Ambulated 3 laps in room with CG; no complaints throughout. Returned to room without fault. Left in bed with all needs met, call bell in reach. Staff in room for exam.    Ileana Lute Mobility Specialist Please contact via SecureChat or Rehab office at [phone]

## 2023-08-14 NOTE — Plan of Care (Signed)
  Problem: Clinical Measurements: Goal: Diagnostic test results will improve Outcome: Not Progressing   Problem: Activity: Goal: Risk for activity intolerance will decrease Outcome: Not Progressing Problem: Elimination: Goal: Will not experience complications related to bowel motility Outcome: Not Progressing   Problem: Elimination: Goal: Will not experience complications related to urinary retention Outcome: Not Progressing   Problem: Pain Management: Goal: General experience of comfort will improve Outcome: Not Progressing   Problem: Safety: Goal: Ability to remain free from injury will improve Outcome: Not Progressing

## 2023-08-14 NOTE — Plan of Care (Signed)
   Problem: Health Behavior/Discharge Planning: Goal: Ability to manage health-related needs will improve Outcome: Progressing   Problem: Clinical Measurements: Goal: Will remain free from infection Outcome: Progressing Goal: Cardiovascular complication will be avoided Outcome: Progressing   Problem: Nutrition: Goal: Adequate nutrition will be maintained Outcome: Progressing

## 2023-08-14 NOTE — Progress Notes (Signed)
 Ankle-brachial index completed. Please see CV Procedures for preliminary results.  Shona Simpson, RVT 08/14/23 4:04 PM

## 2023-08-15 DIAGNOSIS — M87059 Idiopathic aseptic necrosis of unspecified femur: Secondary | ICD-10-CM | POA: Diagnosis not present

## 2023-08-15 MED ORDER — PREDNISONE 10 MG PO TABS: 10.0000 mg | ORAL_TABLET | Freq: Every day | ORAL | Status: DC

## 2023-08-15 NOTE — Progress Notes (Signed)
 Orthopaedic Trauma Service Progress Note  Patient ID: Kathleen Velazquez MRN: [id number] DOB/AGE: October 05, 1955 68 y.o.  Subjective:  Continues to improve No new ortho issues  ABIs are normal    ROS As above  Objective:   VITALS:   Vitals:   08/14/23 1748 08/15/23 0447 08/15/23 0909 08/15/23 1328  BP: 122/70 116/67 115/69 118/69  Pulse: 77 68 (!) 56 72  Resp: 16  16 17   Temp: 98 F (36.7 C) 97.8 F (36.6 C) 97.7 F (36.5 C) 97.9 F (36.6 C)  TempSrc: Oral Oral Oral Oral  SpO2: 98% 97% 98% 98%  Weight:      Height:        Estimated body mass index is 25.89 kg/m as calculated from the following:   Height as of this encounter: 5' 2 (1.575 m).   Weight as of this encounter: 64.2 kg.   Intake/Output      01/06 0701 01/07 0700 01/07 0701 01/08 0700   P.O. 360    Total Intake(mL/kg) 360 (5.6)    Net +360         Urine Occurrence 3 x      LABS  No results found for this or any previous visit (from the past 24 hours).   PHYSICAL EXAM:  Gen: sitting up in chair, very pleasant, looks good, NAD  Ext:  Left upper extremity  Exam continues to improve from yesterday   Improved forearm supination and pronation      Bilateral lower extremities             Exam unchanged and stable   Assessment/Plan:     Anti-infectives (From admission, onward)    None     .  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  This is likely due to her extensive alcohol abuse in the past.  She would benefit for referral to a total joint surgeon to evaluate for total hip arthroplasties.  Continue with symptomatic management for the time being.  Can try topical anti-inflammatories or systemic anti-inflammatories.  Activity as tolerated.  Weight-bear  as tolerated bilateral lower extremities                 Left shoulder pain-MRI does not show AVN of her left humeral head but does show a partial tear of her supraspinatus tendon as well as some localized inflammation around her shoulder joint.  Her symptoms have already appear to have responded to scheduled ketorolac .  Will also start her on a 15-day prednisone  taper as follows                         Prednisone  10 mg 3 tablets by mouth every 12 hours for 5 days then                         Prednisone  10 mg 2 tablets by mouth every 12 hours for 5 days then  Prednisone  10 mg 1 tablet by mouth every 12 hours for 3 days then                         Prednisone  10 mg 1 tablet daily for 2 days                           Would then recommend outpatient PT for her left shoulder to recover function                           Patient denies any acute falls or trauma to her left shoulder therefore suspect that this is related to degenerative wear and tear.  Her subacromial space does not appear to be too narrow.                 Bilateral foot and ankle pain                           ABIs normal      Could be related to lumbar spine issues     See how she responds to steroid taper        If recurs she would benefit from additional w/u   - Pain management:               Multimodal                             Ice, heat                             Nsaids---> steroid taper                              Opioids for severe pain    - Dispo:              Stable for dc from ortho standpoint    PT referral for L shoulder---> partial rotator cuff tear    Recommend she follow up with her total joint surgeon at [facility] to discuss treatment of her bilateral hip AVN   Francis MICAEL Mt, PA-C [phone] (C) 08/15/2023, 2:35 PM  [facility] [address] [place] [address] [phone] (F)    After 5pm and on the weekends please log on to  Amion, go to orthopaedics and the look under the [facility] Call for the provider(s) on call. You can also call our office at [phone] and then follow the prompts to be connected to the call team.  Patient ID: Kathleen Velazquez, female   DOB: May 16, 1956, 68 y.o.   MRN: [id number]

## 2023-08-15 NOTE — Progress Notes (Signed)
 Mobility Specialist: Progress Note   08/15/23 1613  Mobility  Activity Ambulated with assistance in hallway  Level of Assistance Contact guard assist, steadying assist  Assistive Device Front wheel walker  Distance Ambulated (ft) 300 ft  Activity Response Tolerated well  Mobility Referral Yes  Mobility visit 1 Mobility  Mobility Specialist Start Time (ACUTE ONLY) 1538  Mobility Specialist Stop Time (ACUTE ONLY) 1600  Mobility Specialist Time Calculation (min) (ACUTE ONLY) 22 min    Pt was agreeable to mobility session - received in chair soiled. MS assisted with cleanup and linen/gown change while pt stood with RW under SV - no complaints. Continued session with hallway ambulation for ~321ft with CG. Returned to room without fault. Left in chair with all needs met, call bell in reach.   Ileana Lute Mobility Specialist Please contact via SecureChat or Rehab office at [phone]

## 2023-08-15 NOTE — TOC Initial Note (Signed)
 Transition of Care [facility]) - Initial/Assessment Note    Patient Details  Name: Kathleen Velazquez MRN: [id number] Date of Birth: 01/27/1956  [facility] of [facility] CM/SW Contact:    Rosalva Jon Bloch, RN Phone Number: 08/15/2023, 11:14 AM  Clinical Narrative:                 Presents with c/o left arm pain and right lower extremity pain. From home with daughter. PTA required some assist from daughter with ADL'S. No DME usage.  Disposition plan home with home health services. Pt/daughter agreeable to home health service, no provider preference. Referral made with Amy / [facility] for home health RN, PT/OT services and accepted. Referral made with [employer] for DME : hospital bed and platform RW. Equipment will be delivered to pt's home prior to d/c.   [facility] team following and will continue assisting with needs...   Expected Discharge Plan: Home w Home Health Services Barriers to Discharge: Other (must enter comment) (awaiting dme to be delivered to home ( hospital bed, RW))   Patient Goals and CMS Choice     Choice offered to / list presented to : Patient, Adult Children      Expected Discharge Plan and Services   Discharge Planning Services: CM Consult   Living arrangements for the past 2 months: Single Family Home                 DME Arranged: Hospital bed, Walker platform, Walker rolling DME Agency: [employer] Date DME Agency Contacted: 08/15/23 Time DME Agency Contacted: 1007 Representative spoke with at DME Agency: Darlyn HH Arranged: PT, OT HH Agency: [facility] Date [facility] Agency Contacted: 08/15/23 Time HH Agency Contacted: 1008 Representative spoke with at [facility] Agency: Amy  Prior Living Arrangements/Services Living arrangements for the past 2 months: Single Family Home     Do you feel safe going back to the place where you live?: Yes      Need for Family Participation in Patient Care: Yes (Comment) Care giver support system in place?: Yes (comment)    Criminal Activity/Legal Involvement Pertinent to Current Situation/Hospitalization: No - Comment as needed  Activities of Daily Living   ADL Screening (condition at time of admission) Independently performs ADLs?: Yes (appropriate for developmental age) Is the patient deaf or have difficulty hearing?: No Does the patient have difficulty seeing, even when wearing glasses/contacts?: No Does the patient have difficulty concentrating, remembering, or making decisions?: No  Permission Sought/Granted                  Emotional Assessment       Orientation: : Oriented to Self, Oriented to Place, Oriented to  Time, Oriented to Situation Alcohol / Substance Use: Not Applicable Psych Involvement: No (comment)  Admission diagnosis:  Right leg pain [M79.604] Left arm pain [M79.602] Avascular necrosis of bone of hip, unspecified laterality (HCC) [M87.059] Patient Active Problem List   Diagnosis Date Noted   Avascular necrosis of bone of hip (HCC) 08/11/2023   Left shoulder pain 08/11/2023   Chronic diastolic CHF (congestive heart failure) (HCC) 08/11/2023   History of CAD (coronary artery disease) 08/11/2023   GAD (generalized anxiety disorder) 08/11/2023   Hypothyroidism 08/11/2023   Avascular necrosis of bone of hip, unspecified laterality (HCC) 08/11/2023   Essential hypertension 08/11/2023   Transaminitis 08/11/2023   CKD (chronic kidney disease) stage 2, GFR 60-89 ml/min 08/11/2023   NICM (nonischemic cardiomyopathy) (HCC) 11/27/2022   Acute combined systolic and diastolic heart  failure (HCC) 11/21/2022   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   Gastrointestinal hemorrhage associated with anorectal source 11/21/2022   Hyperlipidemia 11/19/2022   Right sided weakness 11/18/2022   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   Abnormal CT of the abdomen 08/10/2016   Hypokalemia 07/27/2016   Generalized weakness  07/27/2016   Hypomagnesemia 07/27/2016   SVT (supraventricular tachycardia) (HCC) 07/27/2016   Diarrhea 07/27/2016   IBS (irritable bowel syndrome) 07/27/2016   GERD (gastroesophageal reflux disease)    Headache    Arthritis    Anxiety    Ejection fraction    Sinus tachycardia    Palpitations 01/16/2012   Postop Acute blood loss anemia 11/29/2011   OA (osteoarthritis) of knee 11/28/2011   PCP:  Orpha Yancey LABOR, MD Pharmacy:   [phone] - [place], [place] - [address] [facility] [address] [facility] [address] [place] [address] Phone: [phone] Fax: [phone]  [facility] [address] [place] [address] Phone: [phone] Fax: [phone]  [facility] [phone] - [place], [place] - [address] AT [facility] [address] [place] [address] Phone: [phone] Fax: [phone]     Social Drivers of Health (SDOH) Social History: SDOH Screenings   Food Insecurity: No Food Insecurity (11/19/2022)  Housing: Low Risk  (11/23/2022)  Transportation Needs: No Transportation Needs (11/23/2022)  Utilities: Not At Risk (11/19/2022)  Alcohol Screen: Low Risk  (11/23/2022)  Financial Resource Strain: Low Risk  (11/23/2022)  Tobacco Use: Low Risk  (08/11/2023)   SDOH Interventions:     Readmission Risk Interventions     No data to display

## 2023-08-15 NOTE — Progress Notes (Signed)
    Durable Medical Equipment  (From admission, onward)           Start     Ordered   08/15/23 0842  For home use only DME Walker rolling  Once       Comments: With L arm platform  Question Answer Comment  Walker: With 5 Inch Wheels   Patient needs a walker to treat with the following condition Gait instability      08/15/23 0843   08/15/23 0842  For home use only DME Hospital bed  Once       Comments: Avascular necrosis of bone of hip  Question Answer Comment  Length of Need 6 Months   Patient has (list medical condition): Avascular necrosis of bone of hip   The above medical condition requires: Patient requires the ability to reposition frequently   Head must be elevated greater than: 30 degrees   Bed type Semi-electric      08/15/23 0843

## 2023-08-15 NOTE — Plan of Care (Signed)
  Problem: Education: Goal: Knowledge of General Education information will improve Description: Including pain rating scale, medication(s)/side effects and non-pharmacologic comfort measures Outcome: Adequate for Discharge   Problem: Activity: Goal: Risk for activity intolerance will decrease Outcome: Adequate for Discharge   Problem: Nutrition: Goal: Adequate nutrition will be maintained Outcome: Adequate for Discharge   Problem: Elimination: Goal: Will not experience complications related to bowel motility Outcome: Adequate for Discharge   

## 2023-08-15 NOTE — Progress Notes (Signed)
 [facility] PROGRESS NOTE    Progress Note  Kathleen Velazquez  FMW:[id number] DOB: May 27, 1956 DOA: 08/11/2023 PCP: Orpha Yancey LABOR, MD     Brief Narrative:   Kathleen Velazquez is an 68 y.o. female past medical history significant for essential hypertension CAD chronic sinus tachycardia, essential hypertension, chronic diastolic dysfunction, chronic pericardial effusion, CAD, chronic kidney disease stage II distal left upper extremity and right lower extremity swelling which is chronic, presents to the ED on 08/10/2022 for left arm pain and right lower extremity pain that started about 3 weeks prior to admission, she denies any fall.  ABIs and lower extremity Dopplers have been negative.  MRI showed bilateral femoral head osteonecrosis and collapse of both femoral heads, left shoulder x-ray showed degenerative changes to the left [facility] joint orthopedic surgery was consulted.  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 Continue narcotics for pain management, started on a bowel regiment. ESR and CRP are elevated. PT evaluated the patient, will need home health PT upon discharge.  Left shoulder supraspinatus tendons tear MRI showed partial tear of her supraspinatus tendon with some localized inflammation. They put her on a 15-day prednisone  taper. PT OT to follow her up as an outpatient.  Bilateral foot and ankle pain: Orthopedic surgery recommended ABIs Continue NSAIDs opiate and ice packs. The relates she may be able to discharge if workup is negative in 48 hours.  Elevated LFTs: Have remained relatively stable, alkaline phosphatase is raising likely due to out bilateral femoral heads necrosis. Denies any abdominal pain hepatitis panel is unremarkable.  Sore throat dry  cough: Respiratory panel were unremarkable. Continue Tessalon  Perles.  Chronic diastolic heart failure/essential hypertension: Continue metoprolol , losartan , Aldactone , Jardiance  and Lasix  (Lasix  is twice a week).  Chronic sinus tachycardia: Continue Toprol  his rate is controlled.  History of CAD: Continue statin metoprolol .  Chronic kidney disease stage II: Noted.  General anxiety disorder: Not on any medications at home.  Hypothyroidism: Continue Synthroid .  Gout: Continue allopurinol .   DVT prophylaxis: lovenox  Family Communication:none Status is: Inpatient Remains inpatient appropriate because: Awaiting placement    Code Status:     Code Status Orders  (From admission, onward)           Start     Ordered   08/11/23 1931  Full code  Continuous       Question:  By:  Answer:  Consent: discussion documented in EHR   08/11/23 1931           Code Status History     Date Active Date Inactive Code Status Order ID Comments User Context   11/18/2022 2249 11/27/2022 1927 Full Code [id number]  Zierle-Ghosh, Asia B, DO ED   02/26/2020 1700 02/28/2020 1722 Full Code [id number]  Reena Roxie CROME, [place] Inpatient   07/27/2016 1858 08/01/2016 1604 Full Code [id number]  Jadine Toribio SQUIBB, MD Inpatient   11/28/2011 1221 12/03/2011 1357 Full Code [id number]  Johnie Lanning Olds, RN Inpatient         IV Access:   Peripheral IV   Procedures and diagnostic studies:   VAS US  ABI WITH/WO TBI Result Date: 08/14/2023  LOWER EXTREMITY DOPPLER STUDY Patient Name:  Kathleen Velazquez  Date of Exam:   08/14/2023 Medical Rec #: [id number]        Accession #:  [id number] Date of Birth: 08/12/1955        Patient Gender: F Patient Age:   23 years Exam Location:  [facility] Procedure:      VAS US  ABI WITH/WO TBI Referring Phys: FRANCIS MT --------------------------------------------------------------------------------  Indications: Claudication. High Risk Factors: Hypertension,  coronary artery disease.  Comparison Study: None. Performing Technologist: Garnette Rockers  Examination Guidelines: A complete evaluation includes at minimum, Doppler waveform signals and systolic blood pressure reading at the level of bilateral brachial, anterior tibial, and posterior tibial arteries, when vessel segments are accessible. Bilateral testing is considered an integral part of a complete examination. Photoelectric Plethysmograph (PPG) waveforms and toe systolic pressure readings are included as required and additional duplex testing as needed. Limited examinations for reoccurring indications may be performed as noted.  ABI Findings: +--------+------------------+-----+----------+--------+ Right   Rt Pressure (mmHg)IndexWaveform  Comment  +--------+------------------+-----+----------+--------+ [id number]                    triphasic          +--------+------------------+-----+----------+--------+ PTA     132               1.07 monophasic         +--------+------------------+-----+----------+--------+ DP      137               1.11 monophasic         +--------+------------------+-----+----------+--------+ +--------+------------------+-----+---------+-------+ Left    Lt Pressure (mmHg)IndexWaveform Comment +--------+------------------+-----+---------+-------+ [id number]                    triphasic        +--------+------------------+-----+---------+-------+ PTA     138               1.12 biphasic         +--------+------------------+-----+---------+-------+ DP      130               1.06 biphasic         +--------+------------------+-----+---------+-------+ +-------+-----------+-----------+------------+------------+ ABI/TBIToday's ABIToday's TBIPrevious ABIPrevious TBI +-------+-----------+-----------+------------+------------+ Right  1.11                                           +-------+-----------+-----------+------------+------------+ Left    1.12                                           +-------+-----------+-----------+------------+------------+  Summary: Right: Resting right ankle-brachial index is within normal range. Left: Resting left ankle-brachial index is within normal range. *See table(s) above for measurements and observations.  Electronically signed by Gaile New MD on 08/14/2023 at 10:32:59 PM.    Final      Medical Consultants:   None.   Subjective:    DERRIAN RODAK pain is controlled had a bowel movement.  Objective:    Vitals:   08/14/23 0746 08/14/23 1748 08/15/23 0447 08/15/23 0909  BP: 113/71 122/70 116/67 115/69  Pulse: 78 77 68 (!) 56  Resp: 16 16  16   Temp: 98.2 F (36.8 C) 98 F (36.7 C) 97.8 F (36.6 C) 97.7 F (36.5 C)  TempSrc:  Oral Oral Oral  SpO2: 97% 98% 97% 98%  Weight:      Height:       SpO2: 98 %  Intake/Output Summary (Last 24 hours) at 08/15/2023 0952 Last data filed at 08/14/2023 1300 Gross per 24 hour  Intake 360 ml  Output --  Net 360 ml   Filed Weights   08/11/23 2122 08/12/23 0449 08/13/23 0500  Weight: 63.1 kg 63.4 kg 64.2 kg    Exam: General exam: In no acute distress. Respiratory system: Good air movement and clear to auscultation. Cardiovascular system: S1 & S2 heard, RRR. No JVD. Gastrointestinal system: Abdomen is nondistended, soft and nontender.  Psychiatry: Judgement and insight appear normal. Mood & affect appropriate.  Data Reviewed:    Labs: Basic Metabolic Panel: Recent Labs  Lab 08/11/23 1230 08/12/23 0430 08/14/23 0500  NA 138 140 137  K 3.8 4.1 4.2  CL 105 109 105  CO2 23 24 22   GLUCOSE 212* 117* 186*  BUN 17 26* 16  CREATININE 0.76 1.00 0.84  CALCIUM  9.1 8.6* 9.0   GFR Estimated Creatinine Clearance: 55.6 mL/min (by C-G formula based on SCr of 0.84 mg/dL). Liver Function Tests: Recent Labs  Lab 08/11/23 1230 08/12/23 0430 08/14/23 0500  AST 42* 92* 93*  ALT 50* 77* 137*  ALKPHOS 169* 163* 324*  BILITOT 0.7 0.5  0.6  PROT 7.2 6.0* 6.6  ALBUMIN  3.0* 2.5* 2.8*   No results for input(s): LIPASE, AMYLASE in the last 168 hours. No results for input(s): AMMONIA in the last 168 hours. Coagulation profile No results for input(s): INR, PROTIME in the last 168 hours. COVID-19 Labs  No results for input(s): DDIMER, FERRITIN, LDH, CRP in the last 72 hours.   Lab Results  Component Value Date   SARSCOV2NAA NEGATIVE 08/11/2023   SARSCOV2NAA NEGATIVE 02/22/2020    CBC: Recent Labs  Lab 08/11/23 1230 08/12/23 0430 08/14/23 0500  WBC 6.3 5.9 7.2  NEUTROABS  --   --  6.5  HGB 12.4 11.4* 13.1  HCT 38.4 34.8* 39.8  MCV 92.1 91.6 91.1  PLT 236 222 283   Cardiac Enzymes: No results for input(s): CKTOTAL, CKMB, CKMBINDEX, TROPONINI in the last 168 hours. BNP (last 3 results) No results for input(s): PROBNP in the last 8760 hours. CBG: No results for input(s): GLUCAP in the last 168 hours. D-Dimer: No results for input(s): DDIMER in the last 72 hours. Hgb A1c: No results for input(s): HGBA1C in the last 72 hours. Lipid Profile: No results for input(s): CHOL, HDL, LDLCALC, TRIG, CHOLHDL, LDLDIRECT in the last 72 hours. Thyroid  function studies: No results for input(s): TSH, T4TOTAL, T3FREE, THYROIDAB in the last 72 hours.  Invalid input(s): FREET3 Anemia work up: No results for input(s): VITAMINB12, FOLATE, FERRITIN, TIBC, IRON , RETICCTPCT in the last 72 hours. Sepsis Labs: Recent Labs  Lab 08/11/23 1230 08/12/23 0430 08/14/23 0500  WBC 6.3 5.9 7.2   Microbiology Recent Results (from the past 240 hours)  Resp panel by RT-PCR (RSV, Flu A&B, Covid) Anterior Nasal Swab     Status: None   Collection Time: 08/11/23 12:21 PM   Specimen: Anterior Nasal Swab  Result Value Ref Range Status   SARS Coronavirus 2 by RT PCR NEGATIVE NEGATIVE Final   Influenza A by PCR NEGATIVE NEGATIVE Final   Influenza B by PCR NEGATIVE NEGATIVE  Final    Comment: (NOTE) The Xpert Xpress SARS-CoV-2/FLU/RSV plus assay is intended as an aid in the diagnosis of influenza from Nasopharyngeal swab specimens and should not be used as a sole basis for treatment. Nasal washings and aspirates are unacceptable for Xpert Xpress SARS-CoV-2/FLU/RSV testing.  Fact Sheet for  Patients: bloggercourse.com  Fact Sheet for Healthcare Providers: seriousbroker.it  This test is not yet approved or cleared by the [place]  FDA and has been authorized for detection and/or diagnosis of SARS-CoV-2 by FDA under an Emergency Use Authorization (EUA). This EUA will remain in effect (meaning this test can be used) for the duration of the COVID-19 declaration under Section 564(b)(1) of the Act, 21 U.S.C. section 360bbb-3(b)(1), unless the authorization is terminated or revoked.     Resp Syncytial Virus by PCR NEGATIVE NEGATIVE Final    Comment: (NOTE) Fact Sheet for Patients: bloggercourse.com  Fact Sheet for Healthcare Providers: seriousbroker.it  This test is not yet approved or cleared by the [place]  FDA and has been authorized for detection and/or diagnosis of SARS-CoV-2 by FDA under an Emergency Use Authorization (EUA). This EUA will remain in effect (meaning this test can be used) for the duration of the COVID-19 declaration under Section 564(b)(1) of the Act, 21 U.S.C. section 360bbb-3(b)(1), unless the authorization is terminated or revoked.  Performed at [facility], [address] [place], [place] [address]      Medications:    allopurinol   100 mg Oral Daily   empagliflozin   10 mg Oral Daily   furosemide   20 mg Oral Once per day on Monday Thursday   heparin   5,000 Units Subcutaneous Q8H   levothyroxine   88 mcg Oral QAC breakfast   losartan   25 mg Oral Daily   metoprolol  succinate  50 mg Oral Daily   polyethylene  glycol  17 g Oral BID   predniSONE   30 mg Oral BID WC   rosuvastatin   10 mg Oral Daily   sodium chloride  flush  3 mL Intravenous Q12H   spironolactone   25 mg Oral Daily   Continuous Infusions:    LOS: 4 days   Erle Odell Castor  [facility]  08/15/2023, 9:52 AM

## 2023-08-16 DIAGNOSIS — M79602 Pain in left arm: Secondary | ICD-10-CM

## 2023-08-16 DIAGNOSIS — M87059 Idiopathic aseptic necrosis of unspecified femur: Secondary | ICD-10-CM | POA: Diagnosis not present

## 2023-08-16 DIAGNOSIS — M79604 Pain in right leg: Secondary | ICD-10-CM

## 2023-08-16 MED ORDER — PREDNISONE 10 MG PO TABS: 10.0000 mg | ORAL_TABLET | Freq: Every day | ORAL | Status: DC

## 2023-08-16 MED ORDER — OXYCODONE HCL 10 MG PO TABS: 10.0000 mg | ORAL_TABLET | Freq: Four times a day (QID) | ORAL | 0 refills | Status: AC | PRN

## 2023-08-16 MED ORDER — PREDNISONE 10 MG PO TABS: ORAL_TABLET | ORAL | 0 refills | Status: DC

## 2023-08-16 NOTE — Plan of Care (Signed)
 Discharging home with her daughter.

## 2023-08-16 NOTE — Progress Notes (Signed)
 Physical Therapy Treatment Patient Details Name: Kathleen Velazquez MRN: [id number] DOB: 28-Mar-1956 Today's Date: 08/16/2023   History of Present Illness Patient is 68 y.o. female presented to ED for Lt arm and Rt leg pain for 3-4 months with worsening in last 3 weeks. Workup reveals negative for DVT in bil LE and LUE, R shoulder image unremarkable, and bil hip imaging indicative of avascular necrosis. L shoulder MRI with partial tear of supraspinatus. PMH  significant of chronic sinus tachycardia, essential hypertension, diastolic heart failure preserved EF, generalized anxiety disorder, chronic pericardial effusion, CAD, CKD stage II, distal left upper extremity and right lower extremity swelling which is chronic in nature.    PT Comments  Pt received in chair, pleasantly agreeable to therapy session and with good participation and tolerance for transfer and gait training with RW use. Pt has some difficulty with sequencing with RW due to L hip and shoulder pain and needs mod cues for proximity to RW and assistance with wayfinding in hallway. Unclear of pt cognitive baseline but per pt report her daughter helps her a lot at home and lives on floor above in same house. Pt chair alarm on for safety. Pt continues to benefit from PT services to progress toward functional mobility goals.     If plan is discharge home, recommend the following: A little help with walking and/or transfers;A little help with bathing/dressing/bathroom;Assistance with cooking/housework;Assist for transportation;Help with stairs or ramp for entrance   Can travel by private vehicle        Equipment Recommendations  Rolling walker (2 wheels)    Recommendations for Other Services       Precautions / Restrictions Precautions Precautions: Fall Precaution Comments: urinary incontinence Restrictions Weight Bearing Restrictions Per Provider Order: No     Mobility  Bed Mobility               General bed mobility  comments: Pt received OOB in recliner, she reports she slept in the chair.    Transfers Overall transfer level: Needs assistance Equipment used: Rolling walker (2 wheels) Transfers: Sit to/from Stand Sit to Stand: Supervision, Contact guard assist           General transfer comment: CGA with stand>sit due to hospital recliner moving even though wheels were locked, pt tends to lean back against frame of chair as she sits down. Fair recall of safe UE placement.    Ambulation/Gait Ambulation/Gait assistance: Contact guard assist Gait Distance (Feet): 125 Feet Assistive device: Rolling walker (2 wheels) Gait Pattern/deviations: Step-to pattern, Decreased step length - right, Decreased step length - left, Decreased stride length, Trunk flexed Gait velocity: grossly <0.3 m/s     General Gait Details: Cues for safer RW proximity and step-to sequencing due to pt c/o L hip pain (L much > R hip pain per pt) and cues for better L palm/finger placement on RW handle for pt safety. Pt c/o some bil heel pain, worse when resting on hard recliner surface, RN notified. Multiple brief standing breaks and cues for wayfinding needed intermittently.   Stairs Stairs:  (pt reports level entry and states she does not go upstairs from her basement entry apartment below her daughter's home)           Wheelchair Mobility     Tilt Bed    Modified Rankin (Stroke Patients Only)       Balance Overall balance assessment: Needs assistance Sitting-balance support: Feet supported, No upper extremity supported Sitting balance-Leahy Scale: Good  Standing balance support: Bilateral upper extremity supported, No upper extremity supported, During functional activity Standing balance-Leahy Scale: Fair Standing balance comment: Pt pulling up briefs standing at RW with LUE support on RW handle and using RUE to pull up                            Cognition Arousal: Alert Behavior During  Therapy: [facility] for tasks assessed/performed Overall Cognitive Status: Within Functional Limits for tasks assessed                                 General Comments: Pt needs some cues for improved sequencing due to pain and for wayfinding in hallway, pt appears to have forgotten her room # then states I did this yesterday also.        Exercises Other Exercises Other Exercises: pt has handout for LUE exercises and foam block for L hand and reports compliance.    General Comments General comments (skin integrity, edema, etc.): reviewed pressure relief frequency/duration when in chair and floating her heels (recommend she sleep in bed in evening if she can, as she slept in chair in hospital and reports increased heel pain from recliner leg rest on her heels) Reviewed ice frequency/duration and monitoring skin for irritation. Pt heels appear dark, RN notified.      Pertinent Vitals/Pain Pain Assessment Pain Assessment: 0-10 Pain Score: 6  Pain Location: L shoulder and L hip, bil heels (with pressure on heels in chair/ambulating) Pain Descriptors / Indicators: Grimacing, Guarding, Discomfort, Sore Pain Intervention(s): Monitored during session, Repositioned, Ice applied, Other (comment) (reviewed floating her heels with pillow under bil calves, RN notified to reinforce with her, family was not present)    Home Living                          Prior Function            PT Goals (current goals can now be found in the care plan section) Acute Rehab PT Goals Patient Stated Goal: To go home with assist from family PT Goal Formulation: With patient Time For Goal Achievement: 08/26/23 Progress towards PT goals: Progressing toward goals    Frequency    Min 1X/week      PT Plan      Co-evaluation              AM-PAC PT 6 Clicks Mobility   Outcome Measure  Help needed turning from your back to your side while in a flat bed without using bedrails?: A  Little Help needed moving from lying on your back to sitting on the side of a flat bed without using bedrails?: A Little Help needed moving to and from a bed to a chair (including a wheelchair)?: A Little Help needed standing up from a chair using your arms (e.g., wheelchair or bedside chair)?: A Little Help needed to walk in hospital room?: A Little Help needed climbing 3-5 steps with a railing? : A Lot 6 Click Score: 17    End of Session Equipment Utilized During Treatment: Gait belt Activity Tolerance: Patient tolerated treatment well Patient left: in chair;with call bell/phone within reach;with chair alarm set;Other (comment) (LUE resting on pillow for comfort, heels floated in recliner) Nurse Communication: Mobility status;Other (comment) (bil heel pain) PT Visit Diagnosis: Unsteadiness on feet (R26.81);Other abnormalities of gait  and mobility (R26.89);Muscle weakness (generalized) (M62.81);Difficulty in walking, not elsewhere classified (R26.2);Pain Pain - part of body: Shoulder;Hip;Leg;Ankle and joints of foot (bil heels sore, L hip>R hip, L shoulder)     Time: 8881-8858 PT Time Calculation (min) (ACUTE ONLY): 23 min  Charges:    $Gait Training: 8-22 mins $Therapeutic Activity: 8-22 mins PT General Charges $$ ACUTE PT VISIT: 1 Visit                     Rhianna Raulerson P., PTA Acute Rehabilitation Services Secure Chat Preferred 9a-5:30pm Office: [phone]    Connell HERO [facility] 08/16/2023, 12:08 PM

## 2023-08-16 NOTE — Progress Notes (Signed)
 Patient waiting on her daughter to bring clothing for discharge.  Pt wants the discharge paper work reviewed with her daughter, whom administered the patient's medications at home.

## 2023-08-16 NOTE — Discharge Summary (Signed)
 Physician Discharge Summary  Kathleen Velazquez FMW:[id number] DOB: 1956/07/18 DOA: 08/11/2023  PCP: Orpha Yancey LABOR, MD  Admit date: 08/11/2023 Discharge date: 08/16/2023  Admitted From: Home Disposition:  Home  Recommendations for Outpatient Follow-up:  Follow up with PCP in 1-2 weeks Please obtain BMP/CBC in one week   Home Health:No Equipment/Devices:None  Discharge Condition:Stable CODE STATUS:Full Diet recommendation: Heart Healthy  Brief/Interim Summary: 68 y.o. female past medical history significant for essential hypertension CAD chronic sinus tachycardia, essential hypertension, chronic diastolic dysfunction, chronic pericardial effusion, CAD, chronic kidney disease stage II distal left upper extremity and right lower extremity swelling which is chronic, presents to the ED on 08/10/2022 for left arm pain and right lower extremity pain that started about 3 weeks prior to admission, she denies any fall.  ABIs and lower extremity Dopplers have been negative.  MRI showed bilateral femoral head osteonecrosis and collapse of both femoral heads, left shoulder x-ray showed degenerative changes to the left [facility] joint orthopedic surgery was consulted.  MRI of the left shoulder showed a tear of the supraspinatus tendon   Discharge Diagnoses:  Principal Problem:   Avascular necrosis of bone of hip (HCC) Active Problems:   Left shoulder pain   Transaminitis   Chronic diastolic CHF (congestive heart failure) (HCC)   Hyperlipidemia   History of CAD (coronary artery disease)   GAD (generalized anxiety disorder)   Hypothyroidism   Avascular necrosis of bone of hip, unspecified laterality (HCC)   Essential hypertension   CKD (chronic kidney disease) stage 2, GFR 60-89 ml/min  Avascular necrosis of the lateral femoral head: Orthopedic surgery was consulted recommended referral as an outpatient to the total joint surgeon for evaluation of total hip arthroplasty. The patient relates her pain is  better she would like to avoid surgery. She was continued on narcotics and a bowel regimen. PT evaluated the patient she will need to go home with PT.  Left shoulder pain due to supraspinatus tendons tear: MRI of the show partial tear of the supraspinatus of the left shoulder orthopedic surgery recommended a steroid taper.  Bilateral foot and ankle pain: ABIs were done which were negative continue ice packs.  Elevate LFTs: Likely due to bilateral femoral head necrosis. Hepatitis panel was unremarkable.  Sore throat dry cough: Improved with insulin  purulence.  Chronic diastolic heart failure/essential hypertension: No changes made to her medication continue current regimen.  Chronic sinus tachycardia: Rate control and Toprol .  CAD: Continue statins.  Chronic kidney disease stage II: Noted.  General anxiety disorder: Not on any medications at home.  Hypothyroidism: Continue Synthroid .  Discharge Instructions  Discharge Instructions     Diet - low sodium heart healthy   Complete by: As directed    Increase activity slowly   Complete by: As directed       Allergies as of 08/16/2023       Reactions   Codeine Nausea And Vomiting        Medication List     STOP taking these medications    traZODone  50 MG tablet Commonly known as: DESYREL        TAKE these medications    allopurinol  100 MG tablet Commonly known as: ZYLOPRIM  Take 100 mg by mouth daily.   empagliflozin  10 MG Tabs tablet Commonly known as: JARDIANCE  Take 1 tablet (10 mg total) by mouth daily.   furosemide  20 MG tablet Commonly known as: LASIX  Take 20 mg by mouth 2 (two) times a week.   levothyroxine  88 MCG tablet  Commonly known as: SYNTHROID  Take 88 mcg by mouth daily before breakfast.   losartan  25 MG tablet Commonly known as: COZAAR  Take 1 tablet (25 mg total) by mouth daily. What changed: how much to take   metoprolol  succinate 50 MG 24 hr tablet Commonly known as:  TOPROL -XL Take 1 tablet (50 mg total) by mouth daily. Take with or immediately following a meal.   Oxycodone  HCl 10 MG Tabs Take 1 tablet (10 mg total) by mouth every 6 (six) hours as needed for moderate pain (pain score 4-6) or severe pain (pain score 7-10).   predniSONE  10 MG tablet Commonly known as: DELTASONE  Takes 6 tablets for 1 days, then 5 tablets for 1 days, then 4 tablets for 1 days, then 3 tablets for 1 days, then 2 tabs for 1 days, then 1 tab for 1 days, and then stop.   predniSONE  10 MG tablet Commonly known as: DELTASONE  Take 1 tablet (10 mg total) by mouth daily. Take as directed on bottle.  Prescription sent to [facility] in [facility] taking on: August 22, 2023   rosuvastatin  10 MG tablet Commonly known as: CRESTOR  Take 1 tablet (10 mg total) by mouth daily.   spironolactone  25 MG tablet Commonly known as: ALDACTONE  Take 1 tablet (25 mg total) by mouth daily.               Durable Medical Equipment  (From admission, onward)           Start     Ordered   08/15/23 0842  For home use only DME Walker rolling  Once       Comments: With L arm platform  Question Answer Comment  Walker: With 5 Inch Wheels   Patient needs a walker to treat with the following condition Gait instability      08/15/23 0843   08/15/23 0842  For home use only DME Hospital bed  Once       Comments: Avascular necrosis of bone of hip  Question Answer Comment  Length of Need 6 Months   Patient has (list medical condition): Avascular necrosis of bone of hip   The above medical condition requires: Patient requires the ability to reposition frequently   Head must be elevated greater than: 30 degrees   Bed type Semi-electric      08/15/23 0843            Follow-up Information     Orpha Yancey LABOR, MD Follow up.   Specialty: Internal Medicine Contact information: [address] [place] [address] [phone]         [facility] Follow up.    Why: home health services will be provided by [facility] information: [address] [place] [address] [phone]         Celena Sharper, MD Follow up.   Specialty: Orthopedic Surgery Why: follow up as needed for Left shoulder or you can follow up at [facility] Contact information: [address] [place] [address] [phone]         [facility] Schedule an appointment as soon as possible for a visit in 4 week(s).   Specialty: Specialist Why: to discuss further treatment of your bilateral hip AVN (avascular necrosis) Contact information: [address] [place] [address] [phone]                Allergies  Allergen Reactions   Codeine  Nausea And Vomiting    Consultations: Orthopedic surgery   Procedures/Studies: VAS US  ABI WITH/WO TBI Result Date: 08/14/2023  LOWER EXTREMITY DOPPLER STUDY Patient Name:  FAREEHA EVON  Date of Exam:   08/14/2023 Medical Rec #: [id number]        Accession #:    [id number] Date of Birth: October 13, 1955        Patient Gender: F Patient Age:   24 years Exam Location:  [facility] Procedure:      VAS US  ABI WITH/WO TBI Referring Phys: FRANCIS MT --------------------------------------------------------------------------------  Indications: Claudication. High Risk Factors: Hypertension, coronary artery disease.  Comparison Study: None. Performing Technologist: Garnette Rockers  Examination Guidelines: A complete evaluation includes at minimum, Doppler waveform signals and systolic blood pressure reading at the level of bilateral brachial, anterior tibial, and posterior tibial arteries, when vessel segments are accessible. Bilateral testing is considered an integral part of a complete examination. Photoelectric Plethysmograph (PPG) waveforms and toe systolic pressure readings are included as required and additional duplex testing as needed. Limited examinations for reoccurring  indications may be performed as noted.  ABI Findings: +--------+------------------+-----+----------+--------+ Right   Rt Pressure (mmHg)IndexWaveform  Comment  +--------+------------------+-----+----------+--------+ [id number]                    triphasic          +--------+------------------+-----+----------+--------+ PTA     132               1.07 monophasic         +--------+------------------+-----+----------+--------+ DP      137               1.11 monophasic         +--------+------------------+-----+----------+--------+ +--------+------------------+-----+---------+-------+ Left    Lt Pressure (mmHg)IndexWaveform Comment +--------+------------------+-----+---------+-------+ [id number]                    triphasic        +--------+------------------+-----+---------+-------+ PTA     138               1.12 biphasic         +--------+------------------+-----+---------+-------+ DP      130               1.06 biphasic         +--------+------------------+-----+---------+-------+ +-------+-----------+-----------+------------+------------+ ABI/TBIToday's ABIToday's TBIPrevious ABIPrevious TBI +-------+-----------+-----------+------------+------------+ Right  1.11                                           +-------+-----------+-----------+------------+------------+ Left   1.12                                           +-------+-----------+-----------+------------+------------+  Summary: Right: Resting right ankle-brachial index is within normal range. Left: Resting left ankle-brachial index is within normal range. *See table(s) above for measurements and observations.  Electronically signed by Gaile New MD on 08/14/2023 at 10:32:59 PM.    Final    DG Elbow 2 Views Left Result Date: 08/12/2023 CLINICAL DATA:  Pain, no known injury EXAM: LEFT ELBOW - 2 VIEW; LEFT FOREARM - 2 VIEW COMPARISON:  None Available. FINDINGS: There is no evidence of fracture,  dislocation, or joint effusion. There is no evidence of arthropathy or other focal bone abnormality. Soft  tissues are unremarkable. IMPRESSION: No fracture or dislocation of the left elbow or left forearm. No radiographic findings to explain pain. Electronically Signed   By: Marolyn JONETTA Jaksch M.D.   On: 08/12/2023 16:55   DG Forearm Left Result Date: 08/12/2023 CLINICAL DATA:  Pain, no known injury EXAM: LEFT ELBOW - 2 VIEW; LEFT FOREARM - 2 VIEW COMPARISON:  None Available. FINDINGS: There is no evidence of fracture, dislocation, or joint effusion. There is no evidence of arthropathy or other focal bone abnormality. Soft tissues are unremarkable. IMPRESSION: No fracture or dislocation of the left elbow or left forearm. No radiographic findings to explain pain. Electronically Signed   By: Marolyn JONETTA Jaksch M.D.   On: 08/12/2023 16:55   MR SHOULDER LEFT WO CONTRAST Result Date: 08/12/2023 CLINICAL DATA:  Shoulder pain, chronic, inflammatory arthritis suspected, xray done EXAM: MRI OF THE LEFT SHOULDER WITHOUT CONTRAST TECHNIQUE: Multiplanar, multisequence MR imaging of the shoulder was performed. No intravenous contrast was administered. COMPARISON:  Radiographs 08/11/2023 FINDINGS: Rotator cuff: Focal intrasubstance insertional tear of the anterior leading edge of the supraspinatus tendon extends to the bursal surface. The articular surface fibers appear intact, and there is no full-thickness tendon tear or tendon retraction. The infraspinatus, subscapularis and teres minor tendons appear normal. Muscles: Mild nonspecific diffuse periarticular edema which involves the rotator cuff musculature. No focal muscular atrophy. Biceps long head:  Intact and normally positioned. Acromioclavicular Joint: The acromion is type 1. Mild acromioclavicular degenerative changes with marrow edema in the distal clavicle and adjacent acromion. No AC joint widening or osteolysis. There is a small to moderate amount of fluid in the  subacromial-subdeltoid bursa. Glenohumeral Joint: No significant shoulder joint effusion or glenohumeral arthropathy. Labrum: Labral assessment limited by the lack of joint fluid.No evidence of labral tear or paralabral cyst. Bones: No evidence of acute fracture, dislocation or osteomyelitis. No glenohumeral subchondral edema or erosive changes. Other: Mild nonspecific periarticular soft tissue edema without focal fluid collection. IMPRESSION: 1. Focal intrasubstance insertional tear of the anterior leading edge of the supraspinatus tendon extending to the bursal surface. No full-thickness tendon tear or tendon retraction. 2. Mild acromioclavicular degenerative changes with marrow edema in the distal clavicle and adjacent acromion. 3. Small to moderate amount of fluid in the subacromial-subdeltoid bursa suggesting bursitis. 4. Mild nonspecific diffuse periarticular edema which involves the rotator cuff musculature. This is nonspecific, although could reflect inflammation. Correlate clinically. 5. The additional components of the rotator cuff, biceps tendon and labrum appear intact. 6. No acute osseous findings or significant glenohumeral arthropathy. Electronically Signed   By: Elsie Perone M.D.   On: 08/12/2023 12:24   US  Abdomen Limited RUQ (LIVER/GB) Result Date: 08/12/2023 CLINICAL DATA:  Elevated liver enzymes EXAM: ULTRASOUND ABDOMEN LIMITED RIGHT UPPER QUADRANT COMPARISON:  None FINDINGS: Gallbladder: Prior cholecystectomy Common bile duct: Diameter: 7 mm Liver: No focal lesion identified. Within normal limits in parenchymal echogenicity. Portal vein is patent on color Doppler imaging with normal direction of blood flow towards the liver. Other: None. IMPRESSION: Prior cholecystectomy.  No ductal dilatation. Electronically Signed   By: Ranell Bring M.D.   On: 08/12/2023 11:31   MR HIP RIGHT WO CONTRAST Result Date: 08/12/2023 CLINICAL DATA:  Increasing right hip pain for 3-4 months. Bilateral femoral  head osteonecrosis on radiographs. EXAM: MR OF THE BILATERAL HIPS WITHOUT CONTRAST TECHNIQUE: Multiplanar, multisequence MR imaging of the pelvis and both hips was performed. No intravenous contrast was administered. COMPARISON:  Radiographs same date.  No other comparison studies. FINDINGS:  Bones: As demonstrated on earlier radiographs, there are changes of advanced bilateral femoral head osteonecrosis with early subchondral collapse of both femoral heads. There is mild asymmetric edema within the right femoral neck. The visualized bony pelvis appears normal. The visualized sacroiliac joints and symphysis pubis appear normal. Articular cartilage and labrum Articular cartilage: Mild secondary degenerative changes at both hips. Labrum: Bilateral acetabular labral degeneration without gross tear or significant paralabral cyst. Joint or bursal effusion Joint effusion: Trace hip joint fluid bilaterally. Bursae: No focal periarticular fluid collection. Muscles and tendons Muscles and tendons: Mild asymmetric common hamstring tendinosis on the right. The left common hamstring tendon appears normal. The gluteus and iliopsoas tendons appear normal bilaterally. No focal muscular atrophy or edema. The piriformis muscles appear symmetric. Other findings Miscellaneous: Mildly prominent fat in both inguinal canals, greater on the right. Bilateral Bartholin cysts are noted, larger on the left. The visualized internal pelvic contents are otherwise unremarkable. IMPRESSION: 1. Advanced bilateral femoral head osteonecrosis with early subchondral collapse of both femoral heads and mild secondary degenerative changes at both hips. 2. No acute osseous findings. 3. Mild asymmetric right common hamstring tendinosis. 4. Bilateral Bartholin cysts, larger on the left. Electronically Signed   By: Elsie Perone M.D.   On: 08/12/2023 08:36   MR HIP LEFT WO CONTRAST Result Date: 08/12/2023 CLINICAL DATA:  Increasing right hip pain for 3-4  months. Bilateral femoral head osteonecrosis on radiographs. EXAM: MR OF THE BILATERAL HIPS WITHOUT CONTRAST TECHNIQUE: Multiplanar, multisequence MR imaging of the pelvis and both hips was performed. No intravenous contrast was administered. COMPARISON:  Radiographs same date.  No other comparison studies. FINDINGS: Bones: As demonstrated on earlier radiographs, there are changes of advanced bilateral femoral head osteonecrosis with early subchondral collapse of both femoral heads. There is mild asymmetric edema within the right femoral neck. The visualized bony pelvis appears normal. The visualized sacroiliac joints and symphysis pubis appear normal. Articular cartilage and labrum Articular cartilage: Mild secondary degenerative changes at both hips. Labrum: Bilateral acetabular labral degeneration without gross tear or significant paralabral cyst. Joint or bursal effusion Joint effusion: Trace hip joint fluid bilaterally. Bursae: No focal periarticular fluid collection. Muscles and tendons Muscles and tendons: Mild asymmetric common hamstring tendinosis on the right. The left common hamstring tendon appears normal. The gluteus and iliopsoas tendons appear normal bilaterally. No focal muscular atrophy or edema. The piriformis muscles appear symmetric. Other findings Miscellaneous: Mildly prominent fat in both inguinal canals, greater on the right. Bilateral Bartholin cysts are noted, larger on the left. The visualized internal pelvic contents are otherwise unremarkable. IMPRESSION: 1. Advanced bilateral femoral head osteonecrosis with early subchondral collapse of both femoral heads and mild secondary degenerative changes at both hips. 2. No acute osseous findings. 3. Mild asymmetric right common hamstring tendinosis. 4. Bilateral Bartholin cysts, larger on the left. Electronically Signed   By: Elsie Perone M.D.   On: 08/12/2023 08:36   DG Shoulder Left Result Date: 08/11/2023 CLINICAL DATA:  Shoulder pain  EXAM: LEFT SHOULDER - 2+ VIEW COMPARISON:  None Available. FINDINGS: Degenerative changes in the [facility] joint with joint space narrowing and spurring. Glenohumeral joint is maintained. No acute bony abnormality. Specifically, no fracture, subluxation, or dislocation. Soft tissues are intact. IMPRESSION: Degenerative changes in the left AC joint. No acute bony abnormality. Electronically Signed   By: Franky Crease M.D.   On: 08/11/2023 20:20   DG Chest 1 View Result Date: 08/11/2023 CLINICAL DATA:  Cough. EXAM: CHEST  1 VIEW COMPARISON:  11/23/2022. FINDINGS: Bilateral lung fields are clear. Bilateral costophrenic angles are clear. Normal cardio-mediastinal silhouette. No acute osseous abnormalities. The soft tissues are within normal limits. IMPRESSION: No active disease. Electronically Signed   By: Ree Molt M.D.   On: 08/11/2023 14:54   DG Hip Unilat W or Wo Pelvis 2-3 Views Right Result Date: 08/11/2023 CLINICAL DATA:  Right hip pain. EXAM: DG HIP (WITH OR WITHOUT PELVIS) 2-3V RIGHT COMPARISON:  None Available. FINDINGS: No acute fracture or dislocation. 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 Visualized sacral arcuate lines are unremarkable. Unremarkable symphysis pubis. There are mild degenerative changes of bilateral hip joints with joint space narrowing and osteophytosis of the superior acetabulum. No radiopaque foreign bodies. IMPRESSION: *Bilateral femoral head AVN. Electronically Signed   By: Ree Molt M.D.   On: 08/11/2023 14:54   US  ARTERIAL LOWER EXTREMITY DUPLEX RIGHT(NON-ABI) Result Date: 07/28/2023 CLINICAL DATA:  Right foot edema greater than 1 week Hypertension EXAM: RIGHT LOWER EXTREMITY ARTERIAL DUPLEX SCAN TECHNIQUE: Gray-scale sonography as well as color Doppler and duplex ultrasound was performed to evaluate the lower extremity arteries including the common,  superficial and profunda femoral arteries, popliteal artery and calf arteries. COMPARISON:  None available FINDINGS: Right lower Extremity Inflow: Normal common femoral arterial waveforms and velocities. No evidence of inflow (aortoiliac) disease. Outflow: Normal profunda femoral, superficial femoral and popliteal arterial waveforms and velocities. No focal elevation of the PSV to suggest stenosis. Runoff: Normal posterior and anterior tibial arterial waveforms and velocities. Vessels are patent to the ankle. IMPRESSION: No Doppler evidence of significant lower extremity arterial occlusive disease. Electronically Signed   By: Aliene Lloyd M.D.   On: 07/28/2023 16:58   US  Venous Img Lower Unilateral Right (DVT) Result Date: 07/28/2023 CLINICAL DATA:  Right lower extremity edema for greater than the past week. Evaluate for DVT. EXAM: RIGHT LOWER EXTREMITY VENOUS DOPPLER ULTRASOUND TECHNIQUE: Gray-scale sonography with graded compression, as well as color Doppler and duplex ultrasound were performed to evaluate the lower extremity deep venous systems from the level of the common femoral vein and including the common femoral, femoral, profunda femoral, popliteal and calf veins including the posterior tibial, peroneal and gastrocnemius veins when visible. The superficial great saphenous vein was also interrogated. Spectral Doppler was utilized to evaluate flow at rest and with distal augmentation maneuvers in the common femoral, femoral and popliteal veins. COMPARISON:  None Available. FINDINGS: Contralateral Common Femoral Vein: Respiratory phasicity is normal and symmetric with the symptomatic side. No evidence of thrombus. Normal compressibility. Common Femoral Vein: No evidence of thrombus. Normal compressibility, respiratory phasicity and response to augmentation. Saphenofemoral Junction: No evidence of thrombus. Normal compressibility and flow on color Doppler imaging. Profunda Femoral Vein: No evidence of  thrombus. Normal compressibility and flow on color Doppler imaging. Femoral Vein: No evidence of thrombus. Normal compressibility, respiratory phasicity and response to augmentation. Popliteal Vein: No evidence of thrombus. Normal compressibility, respiratory phasicity and response to augmentation. Calf Veins: No evidence of thrombus. Normal compressibility and flow on color Doppler imaging. Superficial Great Saphenous Vein: No evidence of thrombus. Normal compressibility. Other Findings:  None. IMPRESSION: No evidence of DVT within the right lower extremity. Electronically Signed   By: Norleen Roulette M.D.   On: 07/28/2023 14:59   US  Venous Img Upper Uni Left (DVT) Result Date: 07/28/2023 CLINICAL DATA:  Left upper extremity pain and edema for the past week. Evaluate for DVT. EXAM:  LEFT UPPER EXTREMITY VENOUS DOPPLER ULTRASOUND TECHNIQUE: Gray-scale sonography with graded compression, as well as color Doppler and duplex ultrasound were performed to evaluate the upper extremity deep venous system from the level of the subclavian vein and including the jugular, axillary, basilic, radial, ulnar and upper cephalic vein. Spectral Doppler was utilized to evaluate flow at rest and with distal augmentation maneuvers. COMPARISON:  None Available. FINDINGS: Contralateral Subclavian Vein: Respiratory phasicity is normal and symmetric with the symptomatic side. No evidence of thrombus. Normal compressibility. Internal Jugular Vein: No evidence of thrombus. Normal compressibility, respiratory phasicity and response to augmentation. Subclavian Vein: No evidence of thrombus. Normal compressibility, respiratory phasicity and response to augmentation. Axillary Vein: No evidence of thrombus. Normal compressibility, respiratory phasicity and response to augmentation. Cephalic Vein: No evidence of thrombus. Normal compressibility, respiratory phasicity and response to augmentation. Basilic Vein: No evidence of thrombus. Normal  compressibility, respiratory phasicity and response to augmentation. Brachial Veins: No evidence of thrombus. Normal compressibility, respiratory phasicity and response to augmentation. Radial Veins: No evidence of thrombus. Normal compressibility, respiratory phasicity and response to augmentation. Ulnar Veins: No evidence of thrombus. Normal compressibility, respiratory phasicity and response to augmentation. Other Findings:  None visualized. IMPRESSION: No evidence of DVT within the left upper extremity. Electronically Signed   By: Norleen Roulette M.D.   On: 07/28/2023 14:58   (Echo, Carotid, EGD, Colonoscopy, ERCP)    Subjective: No complaints  Discharge Exam: Vitals:   08/16/23 0448 08/16/23 0823  BP: 110/69 131/75  Pulse: 63 65  Resp: 14 18  Temp: 98.1 F (36.7 C) 97.9 F (36.6 C)  SpO2: 98% 100%   Vitals:   08/15/23 1328 08/15/23 2111 08/16/23 0448 08/16/23 0823  BP: 118/69 (!) 121/58 110/69 131/75  Pulse: 72 72 63 65  Resp: 17 18 14 18   Temp: 97.9 F (36.6 C) 97.9 F (36.6 C) 98.1 F (36.7 C) 97.9 F (36.6 C)  TempSrc: Oral Oral    SpO2: 98% 98% 98% 100%  Weight:      Height:        General: Pt is alert, awake, not in acute distress Cardiovascular: RRR, S1/S2 +, no rubs, no gallops Respiratory: CTA bilaterally, no wheezing, no rhonchi Abdominal: Soft, NT, ND, bowel sounds + Extremities: no edema, no cyanosis    The results of significant diagnostics from this hospitalization (including imaging, microbiology, ancillary and laboratory) are listed below for reference.     Microbiology: Recent Results (from the past 240 hours)  Resp panel by RT-PCR (RSV, Flu A&B, Covid) Anterior Nasal Swab     Status: None   Collection Time: 08/11/23 12:21 PM   Specimen: Anterior Nasal Swab  Result Value Ref Range Status   SARS Coronavirus 2 by RT PCR NEGATIVE NEGATIVE Final   Influenza A by PCR NEGATIVE NEGATIVE Final   Influenza B by PCR NEGATIVE NEGATIVE Final    Comment:  (NOTE) The Xpert Xpress SARS-CoV-2/FLU/RSV plus assay is intended as an aid in the diagnosis of influenza from Nasopharyngeal swab specimens and should not be used as a sole basis for treatment. Nasal washings and aspirates are unacceptable for Xpert Xpress SARS-CoV-2/FLU/RSV testing.  Fact Sheet for Patients: bloggercourse.com  Fact Sheet for Healthcare Providers: seriousbroker.it  This test is not yet approved or cleared by the [place]  FDA and has been authorized for detection and/or diagnosis of SARS-CoV-2 by FDA under an Emergency Use Authorization (EUA). This EUA will remain in effect (meaning this test can be used) for the duration  of the COVID-19 declaration under Section 564(b)(1) of the Act, 21 U.S.C. section 360bbb-3(b)(1), unless the authorization is terminated or revoked.     Resp Syncytial Virus by PCR NEGATIVE NEGATIVE Final    Comment: (NOTE) Fact Sheet for Patients: bloggercourse.com  Fact Sheet for Healthcare Providers: seriousbroker.it  This test is not yet approved or cleared by the [place]  FDA and has been authorized for detection and/or diagnosis of SARS-CoV-2 by FDA under an Emergency Use Authorization (EUA). This EUA will remain in effect (meaning this test can be used) for the duration of the COVID-19 declaration under Section 564(b)(1) of the Act, 21 U.S.C. section 360bbb-3(b)(1), unless the authorization is terminated or revoked.  Performed at [facility], [address] [place], [place] [address]      Labs: BNP (last 3 results) Recent Labs    03/27/23 0951 07/27/23 0934 08/11/23 1230  BNP 122.8* 74.9 88.9   Basic Metabolic Panel: Recent Labs  Lab 08/11/23 1230 08/12/23 0430 08/14/23 0500  NA 138 140 137  K 3.8 4.1 4.2  CL 105 109 105  CO2 23 24 22   GLUCOSE 212* 117* 186*  BUN 17 26* 16  CREATININE 0.76 1.00  0.84  CALCIUM  9.1 8.6* 9.0   Liver Function Tests: Recent Labs  Lab 08/11/23 1230 08/12/23 0430 08/14/23 0500  AST 42* 92* 93*  ALT 50* 77* 137*  ALKPHOS 169* 163* 324*  BILITOT 0.7 0.5 0.6  PROT 7.2 6.0* 6.6  ALBUMIN  3.0* 2.5* 2.8*   No results for input(s): LIPASE, AMYLASE in the last 168 hours. No results for input(s): AMMONIA in the last 168 hours. CBC: Recent Labs  Lab 08/11/23 1230 08/12/23 0430 08/14/23 0500  WBC 6.3 5.9 7.2  NEUTROABS  --   --  6.5  HGB 12.4 11.4* 13.1  HCT 38.4 34.8* 39.8  MCV 92.1 91.6 91.1  PLT 236 222 283   Cardiac Enzymes: No results for input(s): CKTOTAL, CKMB, CKMBINDEX, TROPONINI in the last 168 hours. BNP: Invalid input(s): POCBNP CBG: No results for input(s): GLUCAP in the last 168 hours. D-Dimer No results for input(s): DDIMER in the last 72 hours. Hgb A1c No results for input(s): HGBA1C in the last 72 hours. Lipid Profile No results for input(s): CHOL, HDL, LDLCALC, TRIG, CHOLHDL, LDLDIRECT in the last 72 hours. Thyroid  function studies No results for input(s): TSH, T4TOTAL, T3FREE, THYROIDAB in the last 72 hours.  Invalid input(s): FREET3 Anemia work up No results for input(s): VITAMINB12, FOLATE, FERRITIN, TIBC, IRON , RETICCTPCT in the last 72 hours. Urinalysis    Component Value Date/Time   COLORURINE STRAW (A) 11/23/2022 2055   APPEARANCEUR HAZY (A) 11/23/2022 2055   LABSPEC 1.006 11/23/2022 2055   PHURINE 5.0 11/23/2022 2055   GLUCOSEU NEGATIVE 11/23/2022 2055   HGBUR NEGATIVE 11/23/2022 2055   BILIRUBINUR NEGATIVE 11/23/2022 2055   KETONESUR NEGATIVE 11/23/2022 2055   PROTEINUR NEGATIVE 11/23/2022 2055   UROBILINOGEN 0.2 11/21/2011 1020   NITRITE NEGATIVE 11/23/2022 2055   LEUKOCYTESUR NEGATIVE 11/23/2022 2055   Sepsis Labs Recent Labs  Lab 08/11/23 1230 08/12/23 0430 08/14/23 0500  WBC 6.3 5.9 7.2   Microbiology Recent Results (from the past 240  hours)  Resp panel by RT-PCR (RSV, Flu A&B, Covid) Anterior Nasal Swab     Status: None   Collection Time: 08/11/23 12:21 PM   Specimen: Anterior Nasal Swab  Result Value Ref Range Status   SARS Coronavirus 2 by RT PCR NEGATIVE NEGATIVE Final   Influenza A  by PCR NEGATIVE NEGATIVE Final   Influenza B by PCR NEGATIVE NEGATIVE Final    Comment: (NOTE) The Xpert Xpress SARS-CoV-2/FLU/RSV plus assay is intended as an aid in the diagnosis of influenza from Nasopharyngeal swab specimens and should not be used as a sole basis for treatment. Nasal washings and aspirates are unacceptable for Xpert Xpress SARS-CoV-2/FLU/RSV testing.  Fact Sheet for Patients: bloggercourse.com  Fact Sheet for Healthcare Providers: seriousbroker.it  This test is not yet approved or cleared by the [place]  FDA and has been authorized for detection and/or diagnosis of SARS-CoV-2 by FDA under an Emergency Use Authorization (EUA). This EUA will remain in effect (meaning this test can be used) for the duration of the COVID-19 declaration under Section 564(b)(1) of the Act, 21 U.S.C. section 360bbb-3(b)(1), unless the authorization is terminated or revoked.     Resp Syncytial Virus by PCR NEGATIVE NEGATIVE Final    Comment: (NOTE) Fact Sheet for Patients: bloggercourse.com  Fact Sheet for Healthcare Providers: seriousbroker.it  This test is not yet approved or cleared by the [place]  FDA and has been authorized for detection and/or diagnosis of SARS-CoV-2 by FDA under an Emergency Use Authorization (EUA). This EUA will remain in effect (meaning this test can be used) for the duration of the COVID-19 declaration under Section 564(b)(1) of the Act, 21 U.S.C. section 360bbb-3(b)(1), unless the authorization is terminated or revoked.  Performed at [facility], [address] [place],  [place] [address]      Time coordinating discharge: Over 35 minutes  SIGNED:   Erle Odell Castor, MD  [facility] 08/16/2023, 8:36 AM Pager   If 7PM-7AM, please contact night-coverage [email or web address] Password [email or web address]

## 2023-08-16 NOTE — Progress Notes (Signed)
 Occupational Therapy Treatment Patient Details Name: Kathleen Velazquez MRN: [id number] DOB: 05-05-1956 Today's Date: 08/16/2023   History of present illness Patient is 68 y.o. female presented to ED for Lt arm and Rt leg pain for 3-4 months with worsening in last 3 weeks. 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 L shoulder MRI with partial tear of supraspinatus. PMH  significant of chronic sinus tachycardia, essential hypertension, diastolic heart failure preserved EF, generalized anxiety disorder, chronic pericardial effusion, CAD, CKD stage II, distal left upper extremity and right lower extremity swelling which is chronic in nature.   OT comments  Patient in recliner up on entry, agreeable to OT session.  Pt able to complete transfers and in room mobility using RW with supervision today, cueing to utilize L UE on RW. She completes LB dressing with supervision, grooming with supervision and UB dressing educated on compensatory techniques.  She reports she will have support at home. Reviewed exercises, reports completing and is able to squeeze foam block more today but remains limited by thumb pain.  Educated on using L hand as stabilizer when opening items for ADLs.  Provided ice pack for pain relief.  Will follow acutely. Continue to recommend HHOT at dc.       If plan is discharge home, recommend the following:  A little help with walking and/or transfers;A little help with bathing/dressing/bathroom;Assistance with cooking/housework;Assist for transportation;Help with stairs or ramp for entrance   Equipment Recommendations  None recommended by OT    Recommendations for Other Services      Precautions / Restrictions Precautions Precautions: Fall Restrictions Weight Bearing Restrictions Per Provider Order: No       Mobility Bed Mobility               General bed mobility comments: OOB  in recliner    Transfers Overall transfer level: Needs assistance Equipment used: Rolling walker (2 wheels) Transfers: Sit to/from Stand Sit to Stand: Supervision           General transfer comment: for safety     Balance Overall balance assessment: Needs assistance Sitting-balance support: Feet supported, No upper extremity supported Sitting balance-Leahy Scale: Good     Standing balance support: Bilateral upper extremity supported, No upper extremity supported, During functional activity Standing balance-Leahy Scale: Fair Standing balance comment: 0-1 hand support during ADLs with supervision, B UE support dynamically                           ADL either performed or assessed with clinical judgement   ADL Overall ADL's : Needs assistance/impaired     Grooming: Wash/dry hands;Supervision/safety;Standing           Upper Body Dressing : Set up;Sitting Upper Body Dressing Details (indicate cue type and reason): reviewed compensatory techniques to thread L LE first Lower Body Dressing: Supervision/safety;Sit to/from stand Lower Body Dressing Details (indicate cue type and reason): doffed/donned socks with supervision, good awareness of safety and techniques Toilet Transfer: Supervision/safety;Ambulation;Rolling walker (2 wheels)         Tub/Shower Transfer Details (indicate cue type and reason): pt plans to basin bathe Functional mobility during ADLs: Supervision/safety;Rolling walker (2 wheels) General ADL Comments: intermittent cueing to place L hand on walker.  No losses of balance today.  She voices understanding with compensatory techniques and will have support at home.    Extremity/Trunk  Assessment              Vision       Perception     Praxis      Cognition Arousal: Alert Behavior During Therapy: WFL for tasks assessed/performed Overall Cognitive Status: Within Functional Limits for tasks assessed                                  General Comments: not formally assessed, but appears [facility]        Exercises      Shoulder Instructions       General Comments pt reports completing exercises provided last session, able to squeeze foam block more today but remains limited by thumb pain with movement.  Educated on using L hand as stabilizer during ADL tasks.    Pertinent Vitals/ Pain       Pain Assessment Pain Assessment: Faces Faces Pain Scale: Hurts little more Pain Location: L thumb, L shoulder Pain Descriptors / Indicators: Grimacing, Guarding, Moaning Pain Intervention(s): Limited activity within patient's tolerance, Monitored during session, Repositioned  Home Living                                          Prior Functioning/Environment              Frequency           Progress Toward Goals  OT Goals(current goals can now be found in the care plan section)  Progress towards OT goals: Progressing toward goals  Acute Rehab OT Goals Patient Stated Goal: home OT Goal Formulation: With patient Time For Goal Achievement: 08/26/23 Potential to Achieve Goals: Good  Plan      Co-evaluation                 AM-PAC OT 6 Clicks Daily Activity     Outcome Measure   Help from another person eating meals?: A Little Help from another person taking care of personal grooming?: A Little Help from another person toileting, which includes using toliet, bedpan, or urinal?: A Little Help from another person bathing (including washing, rinsing, drying)?: A Little Help from another person to put on and taking off regular upper body clothing?: A Little Help from another person to put on and taking off regular lower body clothing?: A Little 6 Click Score: 18    End of Session Equipment Utilized During Treatment: Rolling walker (2 wheels)  OT Visit Diagnosis: Unsteadiness on feet (R26.81);Other abnormalities of gait and mobility (R26.89);Muscle weakness (generalized)  (M62.81);Pain Pain - part of body:  (L UE, thumb)   Activity Tolerance Patient tolerated treatment well   Patient Left in chair;with call bell/phone within reach;with chair alarm set   Nurse Communication Mobility status        Time: 9091-9071 OT Time Calculation (min): 20 min  Charges: OT General Charges $OT Visit: 1 Visit OT Treatments $Self Care/Home Management : 8-22 mins  Etta NOVAK, OT Acute Rehabilitation Services Office [phone]   Etta GORMAN Hope 08/16/2023, 9:38 AM

## 2023-08-17 ENCOUNTER — Telehealth: Payer: Self-pay

## 2023-08-17 NOTE — Patient Outreach (Signed)
 Care Management  Transitions of Care Program Transitions of Care Post-discharge Initial Visit   08/17/2023 Name: Kathleen Velazquez MRN: [id number] DOB: 30-Jan-1956  Subjective: Kathleen Velazquez is a 68 y.o. year old female who is a primary care patient of Hasanaj, Yancey LABOR, MD. The Care Management team Engaged with patient Engaged with patient by telephone to assess and address transitions of care needs.   Consent to Services:  Patient was given information about care management services, agreed to services, and gave verbal consent to participate.   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, Left Shoulder Tendon Tear  SDOH Interventions    Flowsheet Row Telephone from 08/17/2023 in [facility] to Hosp-Admission (Discharged) from 11/18/2022 in [facility]  SDOH Interventions    Food Insecurity Interventions Intervention Not Indicated --  Housing Interventions Intervention Not Indicated Intervention Not Indicated  Transportation Interventions Intervention Not Indicated Intervention Not Indicated  Utilities Interventions Intervention Not Indicated --  Alcohol Usage Interventions -- Intervention Not Indicated (Score <7)  Financial Strain Interventions -- Intervention Not Indicated  Social Connections Interventions Patient Declined --        Goals Addressed             This Visit's Progress    Patient Stated       Current Barriers:  Chronic Disease Management support and education needs related to CHF and Left shoulder tendon tear   RNCM Clinical Goal(s):  Patient will work with the Care Management team over the next 30 days to address Transition of Care Barriers: Medication access Medication Management Diet/Nutrition/Food Resources Support at home Provider appointments Home Health  services Equipment/DME Functional/Safety verbalize understanding of plan for management of CHF and Left shoulder tendon tear as evidenced by No hospital re-admission in the next 30 days  through collaboration with RN Care manager, provider, and care team.   Interventions: Evaluation of current treatment plan related to  self management and patient's adherence to plan as established by provider   Heart Failure Interventions:  (Status:  New goal.) Short Term Goal Basic overview and discussion of pathophysiology of Heart Failure reviewed Provided education on low sodium diet Assessed need for readable accurate scales in home Provided education about placing scale on hard, flat surface Advised patient to weigh each morning after emptying bladder Discussed importance of daily weight and advised patient to weigh and record daily Reviewed role of diuretics in prevention of fluid overload and management of heart failure; Discussed the importance of keeping all appointments with provider  Pain Interventions:  (Status:  New goal.) Short Term Goal Pain assessment performed Medications reviewed Reviewed provider established plan for pain management Discussed importance of adherence to all scheduled medical appointments Advised patient to report to care team affect of pain on daily activities Reviewed with patient prescribed pharmacological and nonpharmacological pain relief strategies Advised patient to discuss increased and different type of pain with provider Screening for signs and symptoms of depression related to chronic disease state   Patient Goals/Self-Care Activities: Participate in Transition of Care Program/Attend [facility] scheduled calls Notify RN Care Manager of TOC call rescheduling needs Take all medications as prescribed Attend all scheduled provider appointments Call pharmacy for medication refills 3-7 days in advance of running out of medications Perform all self care activities  independently  Call provider office for new concerns or questions   Follow  Up Plan:  Telephone follow up appointment with care management team member scheduled for:  Thursday January 9th at 9:30am         [facility] Outreach completed to the patient. She was discharged home 08/16/23 with a Hospital bed and Platform walker. She has a BSC, Lift Chair and other equipment from previous hospitalizations. She states she is not taking her narcotic pain medication and is using Tylenol  instead. She states her hip doesn't cause her pain but the tear in her left shoulder is where she hurts the most. She states the platform walker is a blessing and she has her recliner lift chair where she stays most of the day. Her BSC is beside the recliner so she doesn't have far to walk. The patient has CHF and is on two diuretics but she states she doesn't weigh daily and doesn't give much thought to it. Educated the patient on CHF pathology and why it is important to weigh. She states she will follow up with the Orthopedic for her left hip but she is not interested in surgery on either hip or shoulder.  The patient enrolled in the [facility] Outreach program. Her daughter will call for the PCP Hospital Follow Up.   Please refer to Care Plan for goals and interventions.  Patient educated on red flags signs/symptoms to watch for and was encouraged to report any of these identified, any new symptoms, changes in baseline or medication regimen, change in health status / well-being, or safety concerns to PCP and / or the [facility] Case Management team.   The patient has been provided with contact information for the care management team and has been advised to call with any health-related questions or concerns. The patient verbalized understanding with current POC. The patient is directed to their insurance card regarding availability of benefits coverage.  Medford Balboa, RN Medical Illustrator [facility] [phone]

## 2023-08-18 DIAGNOSIS — I5032 Chronic diastolic (congestive) heart failure: Secondary | ICD-10-CM | POA: Diagnosis not present

## 2023-08-18 DIAGNOSIS — M87052 Idiopathic aseptic necrosis of left femur: Secondary | ICD-10-CM | POA: Diagnosis not present

## 2023-08-18 DIAGNOSIS — I130 Hypertensive heart and chronic kidney disease with heart failure and stage 1 through stage 4 chronic kidney disease, or unspecified chronic kidney disease: Secondary | ICD-10-CM | POA: Diagnosis not present

## 2023-08-18 DIAGNOSIS — N182 Chronic kidney disease, stage 2 (mild): Secondary | ICD-10-CM | POA: Diagnosis not present

## 2023-08-18 DIAGNOSIS — M79602 Pain in left arm: Secondary | ICD-10-CM | POA: Diagnosis not present

## 2023-08-18 DIAGNOSIS — I2510 Atherosclerotic heart disease of native coronary artery without angina pectoris: Secondary | ICD-10-CM | POA: Diagnosis not present

## 2023-08-18 DIAGNOSIS — F411 Generalized anxiety disorder: Secondary | ICD-10-CM | POA: Diagnosis not present

## 2023-08-18 DIAGNOSIS — M25512 Pain in left shoulder: Secondary | ICD-10-CM | POA: Diagnosis not present

## 2023-08-18 DIAGNOSIS — E039 Hypothyroidism, unspecified: Secondary | ICD-10-CM | POA: Diagnosis not present

## 2023-08-18 DIAGNOSIS — I3139 Other pericardial effusion (noninflammatory): Secondary | ICD-10-CM | POA: Diagnosis not present

## 2023-08-24 ENCOUNTER — Telehealth: Payer: Self-pay

## 2023-08-24 ENCOUNTER — Other Ambulatory Visit: Payer: Self-pay

## 2023-08-24 DIAGNOSIS — M75102 Unspecified rotator cuff tear or rupture of left shoulder, not specified as traumatic: Secondary | ICD-10-CM | POA: Diagnosis not present

## 2023-08-24 DIAGNOSIS — I509 Heart failure, unspecified: Secondary | ICD-10-CM | POA: Diagnosis not present

## 2023-08-24 DIAGNOSIS — M87051 Idiopathic aseptic necrosis of right femur: Secondary | ICD-10-CM | POA: Diagnosis not present

## 2023-08-24 DIAGNOSIS — Z515 Encounter for palliative care: Secondary | ICD-10-CM | POA: Diagnosis not present

## 2023-08-24 DIAGNOSIS — G894 Chronic pain syndrome: Secondary | ICD-10-CM | POA: Diagnosis not present

## 2023-08-24 NOTE — Patient Instructions (Signed)
Visit Information  Thank you for taking time to visit with me today. Please don't hesitate to contact me if I can be of assistance to you before our next scheduled telephone appointment.  Following is a copy of your care plan:   Goals Addressed             This Visit's Progress    Patient Stated       Current Barriers:  Chronic Disease Management support and education needs related to CHF and Left shoulder tendon tear   RNCM Clinical Goal(s):  Patient will work with the Care Management team over the next 30 days to address Transition of Care Barriers: Medication access Medication Management Diet/Nutrition/Food Resources Support at home Provider appointments Home Health services Equipment/DME Functional/Safety verbalize understanding of plan for management of CHF and Left shoulder tendon tear as evidenced by No hospital re-admission in the next 30 days  through collaboration with RN Care manager, provider, and care team.   Interventions: Evaluation of current treatment plan related to  self management and patient's adherence to plan as established by provider   Heart Failure Interventions:  (Status:  New goal.) Short Term Goal Basic overview and discussion of pathophysiology of Heart Failure reviewed Provided education on low sodium diet Assessed need for readable accurate scales in home Provided education about placing scale on hard, flat surface Advised patient to weigh each morning after emptying bladder Discussed importance of daily weight and advised patient to weigh and record daily Reviewed role of diuretics in prevention of fluid overload and management of heart failure; Discussed the importance of keeping all appointments with provider  Pain Interventions:  (Status:  New goal.) Short Term Goal Pain assessment performed Medications reviewed Reviewed provider established plan for pain management Discussed importance of adherence to all scheduled medical  appointments Advised patient to report to care team affect of pain on daily activities Reviewed with patient prescribed pharmacological and nonpharmacological pain relief strategies Advised patient to discuss increased and different type of pain with provider Screening for signs and symptoms of depression related to chronic disease state   Patient Goals/Self-Care Activities: Participate in Transition of Care Program/Attend [facility] scheduled calls Notify RN Care Manager of TOC call rescheduling needs Take all medications as prescribed Attend all scheduled provider appointments Call pharmacy for medication refills 3-7 days in advance of running out of medications Perform all self care activities independently  Call provider office for new concerns or questions   Follow Up Plan:  Telephone follow up appointment with care management team member scheduled for:  Thursday January 23 at 1:30pm         The patient verbalized understanding of instructions, educational materials, and care plan provided today and agreed to receive a mailed copy of patient instructions, educational materials, and care plan.   Please call the care guide team at [phone] if you need to cancel or reschedule your appointment.   Please call the Suicide and Crisis Lifeline: 988 call the [place] National Suicide Prevention Lifeline: 2150069802 or TTY: 402-615-2624 TTY [phone]) to talk to a trained counselor if you are experiencing a Mental Health or Behavioral Health Crisis or need someone to talk to.  Deidre Ala, RN Medical illustrator [facility] [phone]

## 2023-08-24 NOTE — Patient Outreach (Signed)
  Care Management  Transitions of Care Program Transitions of Care Post-discharge week 2  08/24/2023 Name: Kathleen Velazquez MRN: [id number] DOB: 05/26/1956  Subjective: Kathleen Velazquez is a 68 y.o. year old female who is a primary care patient of Hasanaj, Myra Gianotti, MD. The Care Management team was unable to reach the patient by phone to assess and address transitions of care needs.   Plan: Additional outreach attempts will be made to reach the patient enrolled in the [facility] Program (Post Inpatient/ED Visit).  Deidre Ala, RN Medical illustrator [facility] [phone]

## 2023-08-24 NOTE — Patient Outreach (Signed)
Care Management  Transitions of Care Program Transitions of Care Post-discharge week 2   08/24/2023 Name: Kathleen Velazquez MRN: [id number] DOB: 29-Oct-1955  Subjective: Kathleen Velazquez is a 68 y.o. year old female who is a primary care patient of Hasanaj, Myra Gianotti, MD. The Care Management team Engaged with patient Engaged with patient by telephone to assess and address transitions of care needs.   Consent to Services:  Patient was given information about care management services, agreed to services, and gave verbal consent to participate.   Assessment:           SDOH Interventions    Flowsheet Row Telephone from 08/24/2023 in [place] [facility] Telephone from 08/17/2023 in [place] [facility]PART[facility] ED to Hosp-Admission (Discharged) from 11/18/2022 in [facility]  SDOH Interventions     Food Insecurity Interventions Intervention Not Indicated Intervention Not Indicated --  Housing Interventions Intervention Not Indicated Intervention Not Indicated Intervention Not Indicated  Transportation Interventions Intervention Not Indicated Intervention Not Indicated Intervention Not Indicated  Utilities Interventions Intervention Not Indicated Intervention Not Indicated --  Alcohol Usage Interventions Intervention Not Indicated (Score <7) -- Intervention Not Indicated (Score <7)  Financial Strain Interventions Intervention Not Indicated -- Intervention Not Indicated  Physical Activity Interventions Patient Declined -- --  Stress Interventions Intervention Not Indicated -- --  Social Connections Interventions Intervention Not Indicated Patient Declined --  Health Literacy Interventions Intervention Not Indicated -- --        Goals Addressed             This Visit's Progress    Patient Stated       Current Barriers:  Chronic Disease Management support and education needs related to CHF and Left shoulder tendon tear   RNCM Clinical  Goal(s):  Patient will work with the Care Management team over the next 30 days to address Transition of Care Barriers: Medication access Medication Management Diet/Nutrition/Food Resources Support at home Provider appointments Home Health services Equipment/DME Functional/Safety verbalize understanding of plan for management of CHF and Left shoulder tendon tear as evidenced by No hospital re-admission in the next 30 days  through collaboration with RN Care manager, provider, and care team.   Interventions: Evaluation of current treatment plan related to  self management and patient's adherence to plan as established by provider   Heart Failure Interventions:  (Status:  New goal.) Short Term Goal Basic overview and discussion of pathophysiology of Heart Failure reviewed Provided education on low sodium diet Assessed need for readable accurate scales in home Provided education about placing scale on hard, flat surface Advised patient to weigh each morning after emptying bladder Discussed importance of daily weight and advised patient to weigh and record daily Reviewed role of diuretics in prevention of fluid overload and management of heart failure; Discussed the importance of keeping all appointments with provider  Pain Interventions:  (Status:  New goal.) Short Term Goal Pain assessment performed Medications reviewed Reviewed provider established plan for pain management Discussed importance of adherence to all scheduled medical appointments Advised patient to report to care team affect of pain on daily activities Reviewed with patient prescribed pharmacological and nonpharmacological pain relief strategies Advised patient to discuss increased and different type of pain with provider Screening for signs and symptoms of depression related to chronic disease state   Patient Goals/Self-Care Activities: Participate in Transition of Care Program/Attend [facility] scheduled calls Notify RN  Care Manager of TOC call rescheduling needs Take all  medications as prescribed Attend all scheduled provider appointments Call pharmacy for medication refills 3-7 days in advance of running out of medications Perform all self care activities independently  Call provider office for new concerns or questions   Follow Up Plan:  Telephone follow up appointment with care management team member scheduled for:  Thursday January 23 at 1:30pm        [facility] Outreach to the patient today. She just received a call from [place] and the team will start 08/25/23. The patient states she is doing okay and has pain but does not want to take the pain medication. She makes herself get up out of the recliner to go to the bathroom and to walk around a bit. She states she went out last night to her grandson's ball game. Declines further intervention. Outreach to be completed next week.   Deidre Ala, RN Medical illustrator [facility]Population [facility] [phone]

## 2023-08-31 ENCOUNTER — Other Ambulatory Visit: Payer: Self-pay

## 2023-08-31 NOTE — Patient Instructions (Signed)
Visit Information  Thank you for taking time to visit with me today. Please don't hesitate to contact me if I can be of assistance to you before our next scheduled telephone appointment.   Following is a copy of your care plan:   Goals Addressed             This Visit's Progress    COMPLETED: 30 Day TOC Care Plan       Current Barriers:  (Reviewed 08/31/23) Chronic Disease Management support and education needs related to CHF and Left shoulder tendon tear   RNCM Clinical Goal(s): (Reviewed 08/31/23) Patient will work with the Care Management team over the next 30 days to address Transition of Care Barriers: Medication access Medication Management Diet/Nutrition/Food Resources Support at home Provider appointments Home Health services Equipment/DME Functional/Safety verbalize understanding of plan for management of CHF and Left shoulder tendon tear as evidenced by No hospital re-admission in the next 30 days  through collaboration with RN Care manager, provider, and care team.   Interventions:  (Reviewed 08/31/23) Evaluation of current treatment plan related to  self management and patient's adherence to plan as established by provider   Heart Failure Interventions:  (Status:  New goal.) Short Term Goal  ((Reviewed 08/31/23) Basic overview and discussion of pathophysiology of Heart Failure reviewed Provided education on low sodium diet Assessed need for readable accurate scales in home Provided education about placing scale on hard, flat surface Advised patient to weigh each morning after emptying bladder Discussed importance of daily weight and advised patient to weigh and record daily Reviewed role of diuretics in prevention of fluid overload and management of heart failure; Discussed the importance of keeping all appointments with provider  Pain Interventions:    (Reviewed 08/31/23)  (Status:  New goal.) Short Term Goal Pain assessment performed Medications reviewed Reviewed  provider established plan for pain management Discussed importance of adherence to all scheduled medical appointments Advised patient to report to care team affect of pain on daily activities Reviewed with patient prescribed pharmacological and nonpharmacological pain relief strategies Advised patient to discuss increased and different type of pain with provider Screening for signs and symptoms of depression related to chronic disease state   Patient Goals/Self-Care Activities:  (Reviewed 08/31/23) Participate in Transition of Care Program/Attend [facility] scheduled calls Notify RN Care Manager of TOC call rescheduling needs Take all medications as prescribed Attend all scheduled provider appointments Call pharmacy for medication refills 3-7 days in advance of running out of medications Perform all self care activities independently  Call provider office for new concerns or questions   Follow Up Plan:  Telephone follow up appointment with care management team member scheduled for:  No further follow up required         The patient verbalized understanding of instructions, educational materials, and care plan provided today and agreed to receive a mailed copy of patient instructions, educational materials, and care plan.   The patient has been provided with contact information for the care management team and has been advised to call with any health related questions or concerns.  No further follow up required: The patient is in agreement  Please call the care guide team at [phone] if you need to cancel or reschedule your appointment.   Please call the Suicide and Crisis Lifeline: 988 call the [place] National Suicide Prevention Lifeline: 5706416489 or TTY: (657) 719-0228 TTY 6314850507) to talk to a trained counselor if you are experiencing a Mental Health or Behavioral Health Crisis or  need someone to talk to.  Deidre Ala, BSN, RN [place]  [facility] - [employer] [facility] RN Care  Manager [phone]

## 2023-08-31 NOTE — Patient Outreach (Signed)
Care Management  Transitions of Care Program Transitions of Care Post-discharge week 3   08/31/2023 Name: TEEYA Velazquez MRN: [id number] DOB: 18-Jun-1956  Subjective: Kathleen Velazquez is a 68 y.o. year old female who is a primary care patient of Hasanaj, Myra Gianotti, MD. The Care Management team Engaged with patient Engaged with patient by telephone to assess and address transitions of care needs.   Consent to Services:  Patient was given information about care management services, agreed to services, and gave verbal consent to participate.   Assessment:  TOC Outreach completed today. The patient states she is stable and has no needs. She was out last night at the skating rink with her grandson and states she sat too long and her hip is bothering her. She took a Oxycodone which she doesn't like to do but she states she needed it today. Home Health PT is working with the patient and giving her some exercises. She has a follow up with her PCP next week. No further TOC needs required.   SDOH Interventions    Flowsheet Row Telephone from 08/24/2023 in [place] [facility] DEPARTMENT Telephone from 08/17/2023 in [place] POPULATION [facility] DEPARTMENT ED to Hosp-Admission (Discharged) from 11/18/2022 in [place]  [facility]  SDOH Interventions     Food Insecurity Interventions Intervention Not Indicated Intervention Not Indicated --  Housing Interventions Intervention Not Indicated Intervention Not Indicated Intervention Not Indicated  Transportation Interventions Intervention Not Indicated Intervention Not Indicated Intervention Not Indicated  Utilities Interventions Intervention Not Indicated Intervention Not Indicated --  Alcohol Usage Interventions Intervention Not Indicated (Score <7) -- Intervention Not Indicated (Score <7)  Financial Strain Interventions Intervention Not Indicated -- Intervention Not Indicated  Physical Activity Interventions Patient Declined -- --  Stress  Interventions Intervention Not Indicated -- --  Social Connections Interventions Intervention Not Indicated Patient Declined --  Health Literacy Interventions Intervention Not Indicated -- --        Goals Addressed             This Visit's Progress    COMPLETED: 30 Day TOC Care Plan       Current Barriers:  (Reviewed 08/31/23) Chronic Disease Management support and education needs related to CHF and Left shoulder tendon tear   RNCM Clinical Goal(s): (Reviewed 08/31/23) Patient will work with the Care Management team over the next 30 days to address Transition of Care Barriers: Medication access Medication Management Diet/Nutrition/Food Resources Support at home Provider appointments Home Health services Equipment/DME Functional/Safety verbalize understanding of plan for management of CHF and Left shoulder tendon tear as evidenced by No hospital re-admission in the next 30 days  through collaboration with RN Care manager, provider, and care team.   Interventions:  (Reviewed 08/31/23) Evaluation of current treatment plan related to  self management and patient's adherence to plan as established by provider   Heart Failure Interventions:  (Status:  New goal.) Short Term Goal  ((Reviewed 08/31/23) Basic overview and discussion of pathophysiology of Heart Failure reviewed Provided education on low sodium diet Assessed need for readable accurate scales in home Provided education about placing scale on hard, flat surface Advised patient to weigh each morning after emptying bladder Discussed importance of daily weight and advised patient to weigh and record daily Reviewed role of diuretics in prevention of fluid overload and management of heart failure; Discussed the importance of keeping all appointments with provider  Pain Interventions:    (Reviewed 08/31/23)  (Status:  New goal.) Short Term Goal  Pain assessment performed Medications reviewed Reviewed provider established plan for  pain management Discussed importance of adherence to all scheduled medical appointments Advised patient to report to care team affect of pain on daily activities Reviewed with patient prescribed pharmacological and nonpharmacological pain relief strategies Advised patient to discuss increased and different type of pain with provider Screening for signs and symptoms of depression related to chronic disease state   Patient Goals/Self-Care Activities:  (Reviewed 08/31/23) Participate in Transition of Care Program/Attend [facility] scheduled calls Notify RN Care Manager of TOC call rescheduling needs Take all medications as prescribed Attend all scheduled provider appointments Call pharmacy for medication refills 3-7 days in advance of running out of medications Perform all self care activities independently  Call provider office for new concerns or questions   Follow Up Plan:  Telephone follow up appointment with care management team member scheduled for:  No further follow up required         Plan: The patient has been provided with contact information for the care management team and has been advised to call with any health related questions or concerns.   Deidre Ala, BSN, RN [place]  VBCI - [employer] [facility] RN Care Manager [phone]

## 2023-09-05 DIAGNOSIS — M10362 Gout due to renal impairment, left knee: Secondary | ICD-10-CM | POA: Diagnosis not present

## 2023-09-05 DIAGNOSIS — I4719 Other supraventricular tachycardia: Secondary | ICD-10-CM | POA: Diagnosis not present

## 2023-09-05 DIAGNOSIS — I5032 Chronic diastolic (congestive) heart failure: Secondary | ICD-10-CM | POA: Diagnosis not present

## 2023-09-05 DIAGNOSIS — K219 Gastro-esophageal reflux disease without esophagitis: Secondary | ICD-10-CM | POA: Diagnosis not present

## 2023-09-05 DIAGNOSIS — M87051 Idiopathic aseptic necrosis of right femur: Secondary | ICD-10-CM | POA: Diagnosis not present

## 2023-09-05 DIAGNOSIS — I10 Essential (primary) hypertension: Secondary | ICD-10-CM | POA: Diagnosis not present

## 2023-09-05 DIAGNOSIS — E7849 Other hyperlipidemia: Secondary | ICD-10-CM | POA: Diagnosis not present

## 2023-09-05 DIAGNOSIS — N182 Chronic kidney disease, stage 2 (mild): Secondary | ICD-10-CM | POA: Diagnosis not present

## 2023-09-05 DIAGNOSIS — D649 Anemia, unspecified: Secondary | ICD-10-CM | POA: Diagnosis not present

## 2023-09-12 DIAGNOSIS — I2510 Atherosclerotic heart disease of native coronary artery without angina pectoris: Secondary | ICD-10-CM | POA: Diagnosis not present

## 2023-09-12 DIAGNOSIS — I130 Hypertensive heart and chronic kidney disease with heart failure and stage 1 through stage 4 chronic kidney disease, or unspecified chronic kidney disease: Secondary | ICD-10-CM | POA: Diagnosis not present

## 2023-09-12 DIAGNOSIS — M25512 Pain in left shoulder: Secondary | ICD-10-CM | POA: Diagnosis not present

## 2023-09-12 DIAGNOSIS — N182 Chronic kidney disease, stage 2 (mild): Secondary | ICD-10-CM | POA: Diagnosis not present

## 2023-09-12 DIAGNOSIS — M79602 Pain in left arm: Secondary | ICD-10-CM | POA: Diagnosis not present

## 2023-09-12 DIAGNOSIS — I5032 Chronic diastolic (congestive) heart failure: Secondary | ICD-10-CM | POA: Diagnosis not present

## 2023-09-12 DIAGNOSIS — E039 Hypothyroidism, unspecified: Secondary | ICD-10-CM | POA: Diagnosis not present

## 2023-09-12 DIAGNOSIS — I3139 Other pericardial effusion (noninflammatory): Secondary | ICD-10-CM | POA: Diagnosis not present

## 2023-09-12 DIAGNOSIS — M87052 Idiopathic aseptic necrosis of left femur: Secondary | ICD-10-CM | POA: Diagnosis not present

## 2023-09-15 DIAGNOSIS — I5032 Chronic diastolic (congestive) heart failure: Secondary | ICD-10-CM | POA: Diagnosis not present

## 2023-09-15 DIAGNOSIS — M87059 Idiopathic aseptic necrosis of unspecified femur: Secondary | ICD-10-CM | POA: Diagnosis not present

## 2023-09-21 DIAGNOSIS — N182 Chronic kidney disease, stage 2 (mild): Secondary | ICD-10-CM | POA: Diagnosis not present

## 2023-09-21 DIAGNOSIS — M87052 Idiopathic aseptic necrosis of left femur: Secondary | ICD-10-CM | POA: Diagnosis not present

## 2023-09-21 DIAGNOSIS — I2510 Atherosclerotic heart disease of native coronary artery without angina pectoris: Secondary | ICD-10-CM | POA: Diagnosis not present

## 2023-09-21 DIAGNOSIS — I5032 Chronic diastolic (congestive) heart failure: Secondary | ICD-10-CM | POA: Diagnosis not present

## 2023-09-21 DIAGNOSIS — M25512 Pain in left shoulder: Secondary | ICD-10-CM | POA: Diagnosis not present

## 2023-09-21 DIAGNOSIS — I3139 Other pericardial effusion (noninflammatory): Secondary | ICD-10-CM | POA: Diagnosis not present

## 2023-09-21 DIAGNOSIS — E039 Hypothyroidism, unspecified: Secondary | ICD-10-CM | POA: Diagnosis not present

## 2023-09-21 DIAGNOSIS — I130 Hypertensive heart and chronic kidney disease with heart failure and stage 1 through stage 4 chronic kidney disease, or unspecified chronic kidney disease: Secondary | ICD-10-CM | POA: Diagnosis not present

## 2023-09-21 DIAGNOSIS — M79602 Pain in left arm: Secondary | ICD-10-CM | POA: Diagnosis not present

## 2023-09-25 DIAGNOSIS — N182 Chronic kidney disease, stage 2 (mild): Secondary | ICD-10-CM | POA: Diagnosis not present

## 2023-09-25 DIAGNOSIS — M87052 Idiopathic aseptic necrosis of left femur: Secondary | ICD-10-CM | POA: Diagnosis not present

## 2023-09-25 DIAGNOSIS — M79602 Pain in left arm: Secondary | ICD-10-CM | POA: Diagnosis not present

## 2023-09-25 DIAGNOSIS — E039 Hypothyroidism, unspecified: Secondary | ICD-10-CM | POA: Diagnosis not present

## 2023-09-25 DIAGNOSIS — I3139 Other pericardial effusion (noninflammatory): Secondary | ICD-10-CM | POA: Diagnosis not present

## 2023-09-25 DIAGNOSIS — I2510 Atherosclerotic heart disease of native coronary artery without angina pectoris: Secondary | ICD-10-CM | POA: Diagnosis not present

## 2023-09-25 DIAGNOSIS — M25512 Pain in left shoulder: Secondary | ICD-10-CM | POA: Diagnosis not present

## 2023-09-25 DIAGNOSIS — I5032 Chronic diastolic (congestive) heart failure: Secondary | ICD-10-CM | POA: Diagnosis not present

## 2023-09-25 DIAGNOSIS — I130 Hypertensive heart and chronic kidney disease with heart failure and stage 1 through stage 4 chronic kidney disease, or unspecified chronic kidney disease: Secondary | ICD-10-CM | POA: Diagnosis not present

## 2023-10-02 DIAGNOSIS — M75102 Unspecified rotator cuff tear or rupture of left shoulder, not specified as traumatic: Secondary | ICD-10-CM | POA: Diagnosis not present

## 2023-10-02 DIAGNOSIS — G894 Chronic pain syndrome: Secondary | ICD-10-CM | POA: Diagnosis not present

## 2023-10-02 DIAGNOSIS — I509 Heart failure, unspecified: Secondary | ICD-10-CM | POA: Diagnosis not present

## 2023-10-02 DIAGNOSIS — Z515 Encounter for palliative care: Secondary | ICD-10-CM | POA: Diagnosis not present

## 2023-10-02 DIAGNOSIS — M87051 Idiopathic aseptic necrosis of right femur: Secondary | ICD-10-CM | POA: Diagnosis not present

## 2023-10-13 DIAGNOSIS — I5032 Chronic diastolic (congestive) heart failure: Secondary | ICD-10-CM | POA: Diagnosis not present

## 2023-10-13 DIAGNOSIS — M87059 Idiopathic aseptic necrosis of unspecified femur: Secondary | ICD-10-CM | POA: Diagnosis not present

## 2023-11-23 ENCOUNTER — Telehealth (HOSPITAL_COMMUNITY): Payer: Self-pay

## 2023-11-23 NOTE — Telephone Encounter (Signed)
 Called to confirm/remind patient of their appointment at the [facility] on 11/24/23***.   Appointment:   [x] Confirmed  [] Left mess   [] No answer/No voice mail  [] Phone not in service  Patient reminded to bring all medications and/or complete list.  Confirmed patient has transportation. Gave directions, instructed to utilize valet parking.

## 2023-11-24 ENCOUNTER — Ambulatory Visit (HOSPITAL_COMMUNITY)
Admission: RE | Admit: 2023-11-24 | Discharge: 2023-11-24 | Disposition: A | Discharge status: Home / Self Care | Payer: 59 | Source: Ambulatory Visit | Attending: Cardiology | Admitting: Cardiology

## 2023-11-24 ENCOUNTER — Telehealth (HOSPITAL_COMMUNITY): Payer: Self-pay

## 2023-11-24 ENCOUNTER — Encounter (HOSPITAL_COMMUNITY): Payer: Self-pay

## 2023-11-24 VITALS — BP 102/68 | HR 74 | Wt 147.8 lb

## 2023-11-24 DIAGNOSIS — M87851 Other osteonecrosis, right femur: Secondary | ICD-10-CM | POA: Insufficient documentation

## 2023-11-24 DIAGNOSIS — I2510 Atherosclerotic heart disease of native coronary artery without angina pectoris: Secondary | ICD-10-CM | POA: Diagnosis not present

## 2023-11-24 DIAGNOSIS — M160 Bilateral primary osteoarthritis of hip: Secondary | ICD-10-CM | POA: Diagnosis not present

## 2023-11-24 DIAGNOSIS — M89751 Major osseous defect, right pelvic region and thigh: Secondary | ICD-10-CM | POA: Diagnosis not present

## 2023-11-24 DIAGNOSIS — M89752 Major osseous defect, left pelvic region and thigh: Secondary | ICD-10-CM | POA: Insufficient documentation

## 2023-11-24 DIAGNOSIS — F40240 Claustrophobia: Secondary | ICD-10-CM | POA: Insufficient documentation

## 2023-11-24 DIAGNOSIS — I130 Hypertensive heart and chronic kidney disease with heart failure and stage 1 through stage 4 chronic kidney disease, or unspecified chronic kidney disease: Secondary | ICD-10-CM | POA: Diagnosis not present

## 2023-11-24 DIAGNOSIS — I5082 Biventricular heart failure: Secondary | ICD-10-CM | POA: Insufficient documentation

## 2023-11-24 DIAGNOSIS — K625 Hemorrhage of anus and rectum: Secondary | ICD-10-CM | POA: Insufficient documentation

## 2023-11-24 DIAGNOSIS — N1832 Chronic kidney disease, stage 3b: Secondary | ICD-10-CM | POA: Diagnosis not present

## 2023-11-24 DIAGNOSIS — I428 Other cardiomyopathies: Secondary | ICD-10-CM | POA: Diagnosis not present

## 2023-11-24 DIAGNOSIS — I3139 Other pericardial effusion (noninflammatory): Secondary | ICD-10-CM | POA: Insufficient documentation

## 2023-11-24 DIAGNOSIS — M87852 Other osteonecrosis, left femur: Secondary | ICD-10-CM | POA: Diagnosis not present

## 2023-11-24 DIAGNOSIS — R000 Tachycardia, unspecified: Secondary | ICD-10-CM | POA: Diagnosis not present

## 2023-11-24 DIAGNOSIS — Z79899 Other long term (current) drug therapy: Secondary | ICD-10-CM | POA: Diagnosis not present

## 2023-11-24 DIAGNOSIS — I5022 Chronic systolic (congestive) heart failure: Secondary | ICD-10-CM

## 2023-11-24 DIAGNOSIS — M75102 Unspecified rotator cuff tear or rupture of left shoulder, not specified as traumatic: Secondary | ICD-10-CM | POA: Diagnosis not present

## 2023-11-24 DIAGNOSIS — F419 Anxiety disorder, unspecified: Secondary | ICD-10-CM | POA: Diagnosis not present

## 2023-11-24 LAB — COMPREHENSIVE METABOLIC PANEL WITH GFR
ALT: 24 U/L (ref 0–44)
AST: 19 U/L (ref 15–41)
Albumin: 3.6 g/dL (ref 3.5–5.0)
Alkaline Phosphatase: 85 U/L (ref 38–126)
Anion gap: 7 (ref 5–15)
BUN: 20 mg/dL (ref 8–23)
CO2: 24 mmol/L (ref 22–32)
Calcium: 9.2 mg/dL (ref 8.9–10.3)
Chloride: 109 mmol/L (ref 98–111)
Creatinine, Ser: 1.11 mg/dL — ABNORMAL HIGH (ref 0.44–1.00)
GFR, Estimated: 53 mL/min — ABNORMAL LOW (ref >60)
Glucose, Bld: 80 mg/dL (ref 70–99)
Potassium: 3.9 mmol/L (ref 3.5–5.1)
Sodium: 140 mmol/L (ref 135–145)
Total Bilirubin: 0.6 mg/dL (ref 0.0–1.2)
Total Protein: 6.4 g/dL — ABNORMAL LOW (ref 6.5–8.1)

## 2023-11-24 LAB — LIPID PANEL
Cholesterol: 128 mg/dL (ref 0–200)
HDL: 46 mg/dL (ref >40)
LDL Cholesterol: 43 mg/dL (ref 0–99)
Total CHOL/HDL Ratio: 2.8 ratio
Triglycerides: 194 mg/dL — ABNORMAL HIGH (ref <150)
VLDL: 39 mg/dL (ref 0–40)

## 2023-11-24 LAB — BRAIN NATRIURETIC PEPTIDE: B Natriuretic Peptide: 175.8 pg/mL — ABNORMAL HIGH (ref 0.0–100.0)

## 2023-11-24 MED ORDER — FUROSEMIDE 20 MG PO TABS: 20.0000 mg | ORAL_TABLET | Freq: Every day | ORAL | 3 refills | Status: DC

## 2023-11-24 MED ORDER — POTASSIUM CHLORIDE ER 10 MEQ PO TBCR: 10.0000 meq | EXTENDED_RELEASE_TABLET | Freq: Every day | ORAL | 3 refills | Status: AC

## 2023-11-24 NOTE — Patient Instructions (Signed)
 There has been no changes to your medications.  Labs done today, your results will be available in MyChart, we will contact you for abnormal readings.  Your physician recommends that you schedule a follow-up appointment in: 4-6 months ( August) ** PLEASE CALL THE OFFICE JUNE TO ARRANGE YOUR FOLLOW UP APPOINTMENT.**  If you have any questions or concerns before your next appointment please send us  a message through [place] or call our office at [phone].    TO LEAVE A MESSAGE FOR THE NURSE SELECT OPTION 2, PLEASE LEAVE A MESSAGE INCLUDING: YOUR NAME DATE OF BIRTH CALL BACK NUMBER REASON FOR CALL**this is important as we prioritize the call backs  YOU WILL RECEIVE A CALL BACK THE SAME DAY AS LONG AS YOU CALL BEFORE 4:00 PM  At the [facility], you and your health needs are our priority. As part of our continuing mission to provide you with exceptional heart care, we have created designated Provider Care Teams. These Care Teams include your primary Cardiologist (physician) and Advanced Practice Providers (APPs- Physician Assistants and Nurse Practitioners) who all work together to provide you with the care you need, when you need it.   You may see any of the following providers on your designated Care Team at your next follow up: Dr Jules Oar Dr Peder Bourdon Dr. Alwin Baars Dr. Arta Lark Amy Marijane Shoulders, NP Ruddy Corral, [place] [facility] [place], [place] Dennise Fitz, NP [place] Lee, NP [place] Dellen, NP Luster Salters, PharmD Bevely Brush, PharmD   Please be sure to bring in all your medications bottles to every appointment.    Thank you for choosing [facility]

## 2023-11-24 NOTE — Telephone Encounter (Signed)
-----   Message from [place] sent at 11/24/2023  1:18 PM EDT ----- Fluid marker elevated. Increase Lasix  to 20 mg daily. F/u BMP and BNP in 1 wk (can do close to home at [facility])

## 2023-11-24 NOTE — Progress Notes (Addendum)
 ADVANCED HF CLINIC PROGRESS NOTE     Referring Physician: Dr. Lonni  Primary Care: Orpha Yancey LABOR, MD Primary Cardiologist: Powell FORBES Sorrow, MD (Inactive) AHF: Dr. Cherrie    Reason for Visit: F/u for HFrEF>>HFimpEF  HPI:  68 y.o. female with a hx of anxiety, HTN, chronic sinus tachycardia and systolic HF referred by Dr. Lonni for further HF evaluation.   Echo 11/2011 EF normal 65-70%, mild LVH, G1DD, mild MR. Normal RV Echo 07/2016 EF 55-60%, trivial MR, RV mildly reduced, trivial pericardial effusion, left pleural effusion   She recently presented to the [facility] on 11/18/22 with right sided weakness and dyspnea. Stroke w/u was negative but she was found to be in acute CHF. BNP 1,370. Chest CT and V/Q scans both negative for PE. EKG showed SR 98 bpm w/ frequent PACs and PVCs. Also noted to have runs of NSVT on tele. 2D echo showed severe biventricular HF. LVEF 25-30%, RV severely reduced, GIIDD, mild-mod MR, mild AoV thickening and mod pericardial effusion. R/LHC c/w NICM, angiography w/ mild nonobstructive CAD (20% mLAD and 20% mLCx lesions). RHC with elevated right and left sided filling pressures, RA 16 mmHg, PAP 47/29, mPCWP 24, FICK CO 4.13, CI 2.58, PAPi 1.1. She apparently refused cMRI. She was diuresed w/ IV Lasix  and transitioned to PO. Placed on low dose GDMT. This was limited by soft BP. Discharged home on 4/21. D/c wt 130 lb.   She was referred to [facility] clinic and ultimately referred to the [facility]. GDMT titrated and referred to Dr. Bensimhon. Had f/u echo 9/24 that showed improved cardiac function. LVEF normalized, 65-70%, RV normal. Small pericardial effusion present.   She presents back to to clinic today for routine f/u. Here w/ her daughter. Her main limitations/complaints deal w/ chronic orthopedic issues, which significantly impact her mobility. Recent MRI of the hips showed bilateral femoral head osteonecrosis and collapse of both femoral heads. Left  shoulder x-ray showed degenerative changes to the left AC. MRI of the left shoulder showed a tear of the supraspinatus tendon. She has been referred to orthopedics and hip replacement surgery has been recommended but patient trying to avoid. She does not want any surgeries.   From a cardiac standpoint, she denies resting dyspnea. Again she is limited in her mobility. Uses walker around the house, SOB ambulating but stable. Limited more by hip pain. Denies CP. BP well controlled. Reports full med compliance no side effects.     Cardiac Testing   Echo 11/20/2022 1. Very difficult study due to poor echo windows and lack of  visualization of LV endocardium. However, based on limited views, there is  severe biventricular failure with estimated LVEF ~25% (endocardium very  poorly visualized) and severe RV dysfunction.   2. Left ventricular ejection fraction, by estimation, is 25 to 30%. The  left ventricle has severely decreased function. Left ventricular  endocardial border not optimally defined to evaluate regional wall motion.  There is mild left ventricular  hypertrophy. Left ventricular diastolic parameters are consistent with  Grade II diastolic dysfunction (pseudonormalization).   3. Right ventricular systolic function is severely reduced. The right  ventricular size is mildly enlarged.   4. Left atrial size was mildly dilated.   5. The mitral valve is degenerative. Mild to moderate mitral valve  regurgitation. Moderate mitral annular calcification.   6. The aortic valve is tricuspid. There is mild calcification of the  aortic valve. There is mild thickening of the aortic valve. Aortic valve  regurgitation is not visualized. Aortic valve sclerosis/calcification is  present, without any evidence of  aortic stenosis.   7. The inferior vena cava is dilated in size with <50% respiratory  variability, suggesting right atrial pressure of 15 mmHg.   8. Moderate pericardial effusion. There is  no evidence of cardiac  tamponade.   Comparison(s): No prior Echocardiogram.      Cath 11/22/2022   Mid LAD lesion is 20% stenosed.   Mid Cx lesion is 20% stenosed.   Mild non-obstructive disease in the mid Circumflex and mid LAD Moderate caliber non-dominant RCA with no disease.  Prominent Thebesian vein network noted on angiography Elevated right and left heart pressures Fick Cardiac Output 4.1 L/min Cardiac index 2.58     Echo 04/2023  1. Left ventricular ejection fraction, by estimation, is 65 to 70%. The  left ventricle has hyperdynamic function. The left ventricle has no  regional wall motion abnormalities. Left ventricular diastolic parameters  are indeterminate. Elevated left atrial   pressure.   2. Right ventricular systolic function is normal. The right ventricular  size is normal. Tricuspid regurgitation signal is inadequate for assessing  PA pressure.   3. A small pericardial effusion is present. The pericardial effusion is  posterior to the left ventricle.   4. The mitral valve is normal in structure. Trivial mitral valve  regurgitation. No evidence of mitral stenosis.   5. The aortic valve is tricuspid. Aortic valve regurgitation is not  visualized. No aortic stenosis is present.   6. The inferior vena cava is normal in size with greater than 50%  respiratory variability, suggesting right atrial pressure of 3 mmHg.     Past Medical History:  Diagnosis Date   Anxiety    Arthritis    knees,    Ejection fraction    Normal, echo, June, 2013   GERD (gastroesophageal reflux disease)    Headache(784.0)    hx of migraines    History of hiatal hernia    Hypertension    Irritable bowel syndrome (IBS)    PONV (postoperative nausea and vomiting)    Sinus tachycardia    Persistent sinus tachycardia, June, 2013 ( prior monitor in 2007 had shown no significant abnormalities.)    Current Outpatient Medications  Medication Sig Dispense Refill   allopurinol   (ZYLOPRIM ) 100 MG tablet Take 100 mg by mouth daily.     empagliflozin  (JARDIANCE ) 10 MG TABS tablet Take 1 tablet (10 mg total) by mouth daily. 30 tablet 11   furosemide  (LASIX ) 20 MG tablet Take 20 mg by mouth 2 (two) times a week.     losartan  (COZAAR ) 25 MG tablet Take 12.5 mg by mouth daily.     metoprolol  succinate (TOPROL -XL) 50 MG 24 hr tablet Take 1 tablet (50 mg total) by mouth daily. Take with or immediately following a meal. 30 tablet 11   oxyCODONE  10 MG TABS Take 1 tablet (10 mg total) by mouth every 6 (six) hours as needed for moderate pain (pain score 4-6) or severe pain (pain score 7-10). 5 tablet 0   predniSONE  (DELTASONE ) 5 MG tablet Take 5 mg by mouth daily with breakfast.     rosuvastatin  (CRESTOR ) 10 MG tablet Take 1 tablet (10 mg total) by mouth daily. 30 tablet 2   spironolactone  (ALDACTONE ) 25 MG tablet Take 1 tablet (25 mg total) by mouth daily. 30 tablet 11   No current facility-administered medications for this encounter.    Allergies  Allergen Reactions   Codeine  Nausea And Vomiting      Social History   Socioeconomic History   Marital status: Widowed    Spouse name: Not on file   Number of children: 1   Years of education: Not on file   Highest education level: Not on file  Occupational History   Occupation: Retired  Tobacco Use   Smoking status: Never   Smokeless tobacco: Never  Vaping Use   Vaping status: Never Used  Substance and Sexual Activity   Alcohol use: Not Currently    Comment: recovering alcoholic x 7 years    Drug use: No   Sexual activity: Not Currently    Birth control/protection: Post-menopausal  Other Topics Concern   Not on file  Social History Narrative   Not on file   Social Drivers of Health   Financial Resource Strain: Low Risk  (08/24/2023)   Overall Financial Resource Strain (CARDIA)    Difficulty of Paying Living Expenses: Not hard at all  Food Insecurity: No Food Insecurity (08/24/2023)   Hunger Vital Sign     Worried About Running Out of Food in the Last Year: Never true    Ran Out of Food in the Last Year: Never true  Transportation Needs: No Transportation Needs (08/24/2023)   PRAPARE - Administrator, Civil Service (Medical): No    Lack of Transportation (Non-Medical): No  Physical Activity: Inactive (08/24/2023)   Exercise Vital Sign    Days of Exercise per Week: 0 days    Minutes of Exercise per Session: 0 min  Stress: No Stress Concern Present (08/24/2023)   [employer] of Occupational Health - Occupational Stress Questionnaire    Feeling of Stress : Not at all  Social Connections: Socially Isolated (08/24/2023)   Social Connection and Isolation Panel [NHANES]    Frequency of Communication with Friends and Family: More than three times a week    Frequency of Social Gatherings with Friends and Family: More than three times a week    Attends Religious Services: Never    Database Administrator or Organizations: No    Attends Banker Meetings: Never    Marital Status: Widowed  Intimate Partner Violence: Not At Risk (08/24/2023)   Humiliation, Afraid, Rape, and Kick questionnaire    Fear of Current or Ex-Partner: No    Emotionally Abused: No    Physically Abused: No    Sexually Abused: No      Family History  Problem Relation Age of Onset   Irritable bowel syndrome Sister     Vitals:   11/24/23 0945  BP: 102/68  Pulse: 74  SpO2: 95%  Weight: 67 kg (147 lb 12.8 oz)    PHYSICAL EXAM: General:  Well appearing, in WC. No respiratory difficulty HEENT: normal Neck: supple. no JVD. Carotids 2+ bilat; no bruits. No lymphadenopathy or thyromegaly appreciated. Cor: PMI nondisplaced. Regular rate & rhythm. No rubs, gallops or murmurs. Lungs: clear Abdomen: soft, nontender, nondistended. No hepatosplenomegaly. No bruits or masses. Good bowel sounds. Extremities: no cyanosis, clubbing, rash, edema Neuro: alert & oriented x 3, cranial nerves grossly intact.  moves all 4 extremities w/o difficulty. Affect pleasant.     EKG:  NSR 72 bpm, personally reivewed    ASSESSMENT & PLAN:  1. 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 LHC mild nonobstructive CAD - RHC w/ elevated R+ L filling pressures, preserved CO (mRA 16, PAP  47/29, mPCWP 24, CO 4.13, CI 2.58, PAPi 1.1)  - refused cMRI due to claustrophobia  - improved w/ GDMT. F/u Echo 9/24 w/ normalized LVEF, 65-70%, RV normal  - Chronic NYHA class II-III, confounded by deconditioning. Not very functional at baseline. Limited by severe bilateral hip OA - Continue Jardiance  10 mg daily. No GU infections   - Continue Losartan  25 mg daily. BP too soft for Entresto   - Continue spironolactone  25 - Continue Lasix  20 mg twice weekly. Check BMP/BNP today   - No longer on  digoxin  w/ normalized EF  - Check Echo next visit. If EF remains normal, can likely graduate [facility] and f/u Cardiology in [place].    2. Pericardial Effusion  - moderate by echo 4/21, no tamponade physiology >>likely 2/2 high RA pressures  - repeat echo 9/24 showed small effusion    3. CAD - nonobstructive by cath 4/23 - mLAD 20% stenosis  - mLCx 20% stenosis  - denies CP  - on ? blocker + statin (LDL at goal, 48 mg/DL) >>check LP and CMP today  - not on ASA given issues w/ rectal bleeding   4. CKD IIIb - b/l Scr ~1.3 - on SGLT2i  - Check BMP   5. 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 Left Rotator Cuff Tear - shoulder MRI showed tear of supraspinatus tendon - followed by ortho  - per above, would recommend echo prior to surgical clearance if she opts for repair    Stable from HF standpoint. F/u w/ Dr. Bensimhon in 4-6 months    Caffie Shed, PA-C  9:50 AM

## 2023-11-24 NOTE — Telephone Encounter (Signed)
 Pt aware, agreeable, and verbalized understanding Labs ordered Rx sent and updated

## 2023-12-01 ENCOUNTER — Other Ambulatory Visit (HOSPITAL_COMMUNITY)
Admission: RE | Admit: 2023-12-01 | Discharge: 2023-12-01 | Disposition: A | Discharge status: Home / Self Care | Source: Ambulatory Visit | Attending: Cardiology | Admitting: Cardiology

## 2023-12-01 DIAGNOSIS — I5022 Chronic systolic (congestive) heart failure: Secondary | ICD-10-CM | POA: Diagnosis present

## 2023-12-01 LAB — BASIC METABOLIC PANEL WITH GFR
Anion gap: 9 (ref 5–15)
BUN: 25 mg/dL — ABNORMAL HIGH (ref 8–23)
CO2: 25 mmol/L (ref 22–32)
Calcium: 9.2 mg/dL (ref 8.9–10.3)
Chloride: 100 mmol/L (ref 98–111)
Creatinine, Ser: 0.98 mg/dL (ref 0.44–1.00)
GFR, Estimated: >60 mL/min (ref >60)
Glucose, Bld: 128 mg/dL — ABNORMAL HIGH (ref 70–99)
Potassium: 3.9 mmol/L (ref 3.5–5.1)
Sodium: 134 mmol/L — ABNORMAL LOW (ref 135–145)

## 2023-12-01 LAB — BRAIN NATRIURETIC PEPTIDE: B Natriuretic Peptide: 114 pg/mL — ABNORMAL HIGH (ref 0.0–100.0)

## 2024-06-05 ENCOUNTER — Telehealth (HOSPITAL_COMMUNITY): Payer: Self-pay

## 2024-06-05 NOTE — Telephone Encounter (Signed)
 Called to confirm/remind patient of their appointment at the [facility] on 06/06/24 9:00.   Appointment:   [x] Confirmed  [] Left mess   [] No answer/No voice mail  [] VM Full/unable to leave message  [] Phone not in service  Patient reminded to bring all medications and/or complete list.  Confirmed patient has transportation. Gave directions, instructed to utilize valet parking.

## 2024-06-05 NOTE — Progress Notes (Incomplete)
 ADVANCED HF CLINIC PROGRESS NOTE     Referring Physician: Dr. Lonni  Primary Care: Orpha Yancey LABOR, MD Primary Cardiologist: Powell FORBES Sorrow, MD (Inactive) AHF: Dr. 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 female with a hx of anxiety, HTN, chronic sinus tachycardia and systolic HF referred by Dr. Lonni for further HF evaluation.   Echo 11/2011 EF normal 65-70%, mild LVH, G1DD, mild MR. Normal RV Echo 07/2016 EF 55-60%, trivial MR, RV mildly reduced, trivial pericardial effusion, left pleural effusion   She presented to [facility] on 11/18/22 with right sided weakness and dyspnea. Stroke w/u was negative but she was found to be in acute CHF. BNP 1,370. Chest CT and V/Q scans both negative for PE. EKG showed SR 98 bpm w/ frequent PACs and PVCs. Also noted to have runs of NSVT on tele. 2D echo showed severe biventricular HF. LVEF 25-30%, RV severely reduced, GIIDD, mild-mod MR, mild AoV thickening and mod pericardial effusion. R/LHC c/w NICM, angiography w/ mild nonobstructive CAD (20% mLAD and 20% mLCx lesions). RHC with elevated right and left sided filling pressures, RA 16 mmHg, PAP 47/29, mPCWP 24, FICK CO 4.13, CI 2.58, PAPi 1.1. She apparently refused cMRI. She was diuresed w/ IV Lasix  and transitioned to PO. Placed on low dose GDMT. This was limited by soft BP. Discharged home on 4/21. D/c wt 130 lb.   She was referred to [facility] clinic and ultimately referred to the [facility]. GDMT titrated and referred to Dr. Bensimhon. Had f/u echo 9/24 that showed improved cardiac function. LVEF normalized, 65-70%, RV normal. Small pericardial effusion present.   Today she returns for AHF follow up with her daughter. Overall feeling alright. Denies palpitations, CP, dizziness, edema, or PND/Orthopnea. No SOB. Appetite good, does not watch what she eats. No fever or chills. Weight at home 148 pounds. Taking all medications. Denies ETOH, tobacco or drug use.    Cardiac  Testing  Echo 11/20/2022 1. Very difficult study due to poor echo windows and lack of  visualization of LV endocardium. However, based on limited views, there is  severe biventricular failure with estimated LVEF ~25% (endocardium very  poorly visualized) and severe RV dysfunction.   2. Left ventricular ejection fraction, by estimation, is 25 to 30%. The  left ventricle has severely decreased function. Left ventricular  endocardial border not optimally defined to evaluate regional wall motion.  There is mild left ventricular  hypertrophy. Left ventricular diastolic parameters are consistent with  Grade II diastolic dysfunction (pseudonormalization).   3. Right ventricular systolic function is severely reduced. The right  ventricular size is mildly enlarged.   4. Left atrial size was mildly dilated.   5. The mitral valve is degenerative. Mild to moderate mitral valve  regurgitation. Moderate mitral annular calcification.   6. The aortic valve is tricuspid. There is mild calcification of the  aortic valve. There is mild thickening of the aortic valve. Aortic valve  regurgitation is not visualized. Aortic valve sclerosis/calcification is  present, without any evidence of  aortic stenosis.   7. The inferior vena cava is dilated in size with <50% respiratory  variability, suggesting right atrial pressure of 15 mmHg.   8. Moderate pericardial effusion. There is no evidence of cardiac  tamponade.   Comparison(s): No prior Echocardiogram.      Cath 11/22/2022   Mid LAD lesion is 20% stenosed.   Mid Cx lesion is 20% stenosed.   Mild non-obstructive disease in  the mid Circumflex and mid LAD Moderate caliber non-dominant RCA with no disease.  Prominent Thebesian vein network noted on angiography Elevated right and left heart pressures Fick Cardiac Output 4.1 L/min Cardiac index 2.58     Echo 04/2023  1. Left ventricular ejection fraction, by estimation, is 65 to 70%. The  left ventricle  has hyperdynamic function. The left ventricle has no  regional wall motion abnormalities. Left ventricular diastolic parameters  are indeterminate. Elevated left atrial   pressure.   2. Right ventricular systolic function is normal. The right ventricular  size is normal. Tricuspid regurgitation signal is inadequate for assessing  PA pressure.   3. A small pericardial effusion is present. The pericardial effusion is  posterior to the left ventricle.   4. The mitral valve is normal in structure. Trivial mitral valve  regurgitation. No evidence of mitral stenosis.   5. The aortic valve is tricuspid. Aortic valve regurgitation is not  visualized. No aortic stenosis is present.   6. The inferior vena cava is normal in size with greater than 50%  respiratory variability, suggesting right atrial pressure of 3 mmHg.     Past Medical History:  Diagnosis Date   Anxiety    Arthritis    knees,    Ejection fraction    Normal, echo, June, 2013   GERD (gastroesophageal reflux disease)    Headache(784.0)    hx of migraines    History of hiatal hernia    Hypertension    Irritable bowel syndrome (IBS)    PONV (postoperative nausea and vomiting)    Sinus tachycardia    Persistent sinus tachycardia, June, 2013 ( prior monitor in 2007 had shown no significant abnormalities.)    Current Outpatient Medications  Medication Sig Dispense Refill   alendronate (FOSAMAX) 70 MG tablet Take 70 mg by mouth once a week. Take with a full glass of water on an empty stomach.     empagliflozin  (JARDIANCE ) 10 MG TABS tablet Take 1 tablet (10 mg total) by mouth daily. 30 tablet 11   furosemide  (LASIX ) 20 MG tablet Take 1 tablet (20 mg total) by mouth daily. 30 tablet 3   metoprolol  succinate (TOPROL -XL) 50 MG 24 hr tablet Take 1 tablet (50 mg total) by mouth daily. Take with or immediately following a meal. 30 tablet 11   oxyCODONE  10 MG TABS Take 1 tablet (10 mg total) by mouth every 6 (six) hours as needed for  moderate pain (pain score 4-6) or severe pain (pain score 7-10). 5 tablet 0   potassium chloride  (KLOR-CON ) 10 MEQ tablet Take 1 tablet (10 mEq total) by mouth daily. 90 tablet 3   predniSONE  (DELTASONE ) 5 MG tablet Take 5 mg by mouth daily with breakfast.     rosuvastatin  (CRESTOR ) 10 MG tablet Take 1 tablet (10 mg total) by mouth daily. 30 tablet 2   spironolactone  (ALDACTONE ) 25 MG tablet Take 1 tablet (25 mg total) by mouth daily. 30 tablet 11   allopurinol  (ZYLOPRIM ) 100 MG tablet Take 100 mg by mouth daily. (Patient not taking: Reported on 06/06/2024)     losartan  (COZAAR ) 25 MG tablet Take 12.5 mg by mouth daily. (Patient not taking: Reported on 06/06/2024)     No current facility-administered medications for this encounter.    Allergies  Allergen Reactions   Codeine Nausea And Vomiting      Social History   Socioeconomic History   Marital status: Widowed    Spouse name: Not on file  Number of children: 1   Years of education: Not on file   Highest education level: Not on file  Occupational History   Occupation: Retired  Tobacco Use   Smoking status: Never   Smokeless tobacco: Never  Vaping Use   Vaping status: Never Used  Substance and Sexual Activity   Alcohol use: Not Currently    Comment: recovering alcoholic x 7 years    Drug use: No   Sexual activity: Not Currently    Birth control/protection: Post-menopausal  Other Topics Concern   Not on file  Social History Narrative   Not on file   Social Drivers of Health   Financial Resource Strain: Low Risk  (08/24/2023)   Overall Financial Resource Strain (CARDIA)    Difficulty of Paying Living Expenses: Not hard at all  Food Insecurity: No Food Insecurity (08/24/2023)   Hunger Vital Sign    Worried About Running Out of Food in the Last Year: Never true    Ran Out of Food in the Last Year: Never true  Transportation Needs: No Transportation Needs (08/24/2023)   PRAPARE - Administrator, Civil Service  (Medical): No    Lack of Transportation (Non-Medical): No  Physical Activity: Inactive (08/24/2023)   Exercise Vital Sign    Days of Exercise per Week: 0 days    Minutes of Exercise per Session: 0 min  Stress: No Stress Concern Present (08/24/2023)   [employer] of Occupational Health - Occupational Stress Questionnaire    Feeling of Stress : Not at all  Social Connections: Socially Isolated (08/24/2023)   Social Connection and Isolation Panel    Frequency of Communication with Friends and Family: More than three times a week    Frequency of Social Gatherings with Friends and Family: More than three times a week    Attends Religious Services: Never    Database Administrator or Organizations: No    Attends Banker Meetings: Never    Marital Status: Widowed  Intimate Partner Violence: Not At Risk (08/24/2023)   Humiliation, Afraid, Rape, and Kick questionnaire    Fear of Current or Ex-Partner: No    Emotionally Abused: No    Physically Abused: No    Sexually Abused: No      Family History  Problem Relation Age of Onset   Irritable bowel syndrome Sister     Vitals:   06/06/24 0903  BP: 110/74  Pulse: 74  SpO2: 97%  Weight: 70.3 kg (155 lb)  Height: 5' 2 (1.575 m)    PHYSICAL EXAM: General:  elderly appearing.  No respiratory difficulty. Arrived in [facility] Neck: JVD flat.  Cor: Regular rate & rhythm. No murmurs. Lungs: clear Extremities: no edema  Neuro: alert & oriented x 3. Affect pleasant.    EKG:  NSR with PAC 79 bpm (Personally reviewed)    ASSESSMENT & PLAN:  1. 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 LHC mild nonobstructive CAD - RHC w/ elevated R+ L filling pressures, preserved CO (mRA 16, PAP 47/29, mPCWP 24, CO 4.13, CI 2.58, PAPi 1.1)  - refused cMRI due to claustrophobia  - improved w/ GDMT. F/u Echo 9/24 w/ normalized LVEF, 65-70%, RV normal  -  Chronic NYHA class II-IIIa, confounded by deconditioning. Not very functional at baseline. Limited by severe bilateral hip OA - Continue Jardiance  10 mg daily. No GU infections   - Off Losartan  with  soft BP - Continue spironolactone  25 - Continue Lasix  20 mg 3x/week. Check BMP/BNP today   - No longer on digoxin  w/ normalized EF  - Update echo for surgical clearance. If EF remains normal, can likely graduate from the [facility] soon and f/u with Cardiology in [place].   2. Pericardial Effusion  - moderate by echo 4/21, no tamponade physiology >>likely 2/2 high RA pressures  - repeat echo 9/24 showed small effusion   3. CAD - nonobstructive by cath 4/23 - mLAD 20% stenosis  - mLCx 20% stenosis  - denies CP  - on ? blocker + statin (LDL at goal, 43 mg/DL 5/74)   - not on ASA given issues w/ rectal bleeding   4. CKD IIIb - b/l Scr ~1.3 - on SGLT2i  - Check BMP   5. 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 Sees ortho mid Nov for surgical scheduling.  - Will check EKG, labs and echo (next available) today. Plan for full clearance at f/u.  - He is planning to undergo XXX (full procedure not posted yet) by Dr. Melodi at [facility]. His risk of perioperative cardiac complications using RCRI calculator is low (1.1%). No contraindication to proceed with surgery from cardiac standpoint.   6. Left Rotator Cuff Tear - shoulder MRI showed tear of supraspinatus tendon - followed by ortho   Follow up with APP in 3-4 weeks + echo for surgical clearance.   Beckey LITTIE Coe, NP  9:12 AM

## 2024-06-06 ENCOUNTER — Ambulatory Visit (HOSPITAL_COMMUNITY)
Admission: RE | Admit: 2024-06-06 | Discharge: 2024-06-06 | Disposition: A | Discharge status: Home / Self Care | Source: Ambulatory Visit | Attending: Internal Medicine | Admitting: Internal Medicine

## 2024-06-06 ENCOUNTER — Ambulatory Visit (HOSPITAL_COMMUNITY): Payer: Self-pay | Admitting: Internal Medicine

## 2024-06-06 ENCOUNTER — Telehealth (HOSPITAL_COMMUNITY): Payer: Self-pay | Admitting: *Deleted

## 2024-06-06 ENCOUNTER — Encounter (HOSPITAL_COMMUNITY): Payer: Self-pay

## 2024-06-06 VITALS — BP 110/74 | HR 74 | Ht 62.0 in | Wt 155.0 lb

## 2024-06-06 DIAGNOSIS — Z8719 Personal history of other diseases of the digestive system: Secondary | ICD-10-CM | POA: Diagnosis not present

## 2024-06-06 DIAGNOSIS — R5381 Other malaise: Secondary | ICD-10-CM | POA: Diagnosis not present

## 2024-06-06 DIAGNOSIS — Z79899 Other long term (current) drug therapy: Secondary | ICD-10-CM | POA: Diagnosis not present

## 2024-06-06 DIAGNOSIS — M160 Bilateral primary osteoarthritis of hip: Secondary | ICD-10-CM | POA: Insufficient documentation

## 2024-06-06 DIAGNOSIS — I428 Other cardiomyopathies: Secondary | ICD-10-CM | POA: Diagnosis not present

## 2024-06-06 DIAGNOSIS — I5042 Chronic combined systolic (congestive) and diastolic (congestive) heart failure: Secondary | ICD-10-CM | POA: Insufficient documentation

## 2024-06-06 DIAGNOSIS — M87051 Idiopathic aseptic necrosis of right femur: Secondary | ICD-10-CM | POA: Insufficient documentation

## 2024-06-06 DIAGNOSIS — M87052 Idiopathic aseptic necrosis of left femur: Secondary | ICD-10-CM | POA: Insufficient documentation

## 2024-06-06 DIAGNOSIS — I130 Hypertensive heart and chronic kidney disease with heart failure and stage 1 through stage 4 chronic kidney disease, or unspecified chronic kidney disease: Secondary | ICD-10-CM | POA: Insufficient documentation

## 2024-06-06 DIAGNOSIS — I5022 Chronic systolic (congestive) heart failure: Secondary | ICD-10-CM | POA: Diagnosis present

## 2024-06-06 DIAGNOSIS — F40240 Claustrophobia: Secondary | ICD-10-CM | POA: Insufficient documentation

## 2024-06-06 DIAGNOSIS — I5082 Biventricular heart failure: Secondary | ICD-10-CM | POA: Diagnosis not present

## 2024-06-06 DIAGNOSIS — I3139 Other pericardial effusion (noninflammatory): Secondary | ICD-10-CM | POA: Diagnosis not present

## 2024-06-06 DIAGNOSIS — M75102 Unspecified rotator cuff tear or rupture of left shoulder, not specified as traumatic: Secondary | ICD-10-CM | POA: Diagnosis not present

## 2024-06-06 DIAGNOSIS — I491 Atrial premature depolarization: Secondary | ICD-10-CM | POA: Insufficient documentation

## 2024-06-06 DIAGNOSIS — N1832 Chronic kidney disease, stage 3b: Secondary | ICD-10-CM | POA: Insufficient documentation

## 2024-06-06 DIAGNOSIS — I2510 Atherosclerotic heart disease of native coronary artery without angina pectoris: Secondary | ICD-10-CM | POA: Diagnosis not present

## 2024-06-06 DIAGNOSIS — Z01818 Encounter for other preprocedural examination: Secondary | ICD-10-CM

## 2024-06-06 LAB — BASIC METABOLIC PANEL WITH GFR
Anion gap: 10 (ref 5–15)
BUN: 23 mg/dL (ref 8–23)
CO2: 24 mmol/L (ref 22–32)
Calcium: 8.8 mg/dL — ABNORMAL LOW (ref 8.9–10.3)
Chloride: 105 mmol/L (ref 98–111)
Creatinine, Ser: 1.42 mg/dL — ABNORMAL HIGH (ref 0.44–1.00)
GFR, Estimated: 40 mL/min — ABNORMAL LOW (ref >60)
Glucose, Bld: 143 mg/dL — ABNORMAL HIGH (ref 70–99)
Potassium: 4.3 mmol/L (ref 3.5–5.1)
Sodium: 139 mmol/L (ref 135–145)

## 2024-06-06 LAB — BRAIN NATRIURETIC PEPTIDE: B Natriuretic Peptide: 154.7 pg/mL — ABNORMAL HIGH (ref 0.0–100.0)

## 2024-06-06 MED ORDER — FUROSEMIDE 20 MG PO TABS: 20.0000 mg | ORAL_TABLET | ORAL | 3 refills | Status: AC

## 2024-06-06 NOTE — Telephone Encounter (Signed)
 Called patient and her daughter (at pt request) per Beckey Coe, NP for lab results and instructions:  Mild AKI. Can you call and see how often she is taking lasix  please. Daily vs twice a week?  Daughter says her mother has been taking Lasix  20 mg three times weekly. Asked her to decrease to twice weekly and will review with provider. If any change to this plan, we will call daughter back. Tanya verbalized understanding of same.

## 2024-06-06 NOTE — Addendum Note (Signed)
 Encounter addended by: Hayes Beckey CROME, NP on: 06/06/2024 12:30 PM  Actions taken: Clinical Note Signed

## 2024-06-06 NOTE — Telephone Encounter (Signed)
 Called patient and daughter with following per Beckey Coe, NP:  Agree with decreasing lasix  to twice a week. Please repeat BMET/BNP in ~7-10 days. Avoid salty foods and limit fluid intake to <64 oz/day.  Daughter Stana) verbalized understanding of same. She will take patient to [facility] lab in 7 - 10 days for repeat labs. Orders entered.

## 2024-06-06 NOTE — Patient Instructions (Addendum)
 STOP losartan   Labs done today, your results will be available in MyChart, we will contact you for abnormal readings.  Your physician has requested that you have an echocardiogram. Echocardiography is a painless test that uses sound waves to create images of your heart. It provides your doctor with information about the size and shape of your heart and how well your heart's chambers and valves are working. This procedure takes approximately one hour. There are no restrictions for this procedure. Please do NOT wear cologne, perfume, aftershave, or lotions (deodorant is allowed). Please arrive 15 minutes prior to your appointment time.  Please note: We ask at that you not bring children with you during ultrasound (echo/ vascular) testing. Due to room size and safety concerns, children are not allowed in the ultrasound rooms during exams. Our front office staff cannot provide observation of children in our lobby area while testing is being conducted. An adult accompanying a patient to their appointment will only be allowed in the ultrasound room at the discretion of the ultrasound technician under special circumstances. We apologize for any inconvenience.  Your physician recommends that you schedule a follow-up appointment in: as scheduled.  If you have any questions or concerns before your next appointment please send us  a message through [place] or call our office at [phone].    TO LEAVE A MESSAGE FOR THE NURSE SELECT OPTION 2, PLEASE LEAVE A MESSAGE INCLUDING: YOUR NAME DATE OF BIRTH CALL BACK NUMBER REASON FOR CALL**this is important as we prioritize the call backs  YOU WILL RECEIVE A CALL BACK THE SAME DAY AS LONG AS YOU CALL BEFORE 4:00 PM  At the [facility], you and your health needs are our priority. As part of our continuing mission to provide you with exceptional heart care, we have created designated Provider Care Teams. These Care Teams include your primary  Cardiologist (physician) and Advanced Practice Providers (APPs- Physician Assistants and Nurse Practitioners) who all work together to provide you with the care you need, when you need it.   You may see any of the following providers on your designated Care Team at your next follow up: Dr Toribio Fuel Dr Ezra Shuck Dr. Morene Brownie Greig Mosses, NP Caffie Shed, [place] [facility] [place], [place] Beckey Coe, NP Jordan Lee, NP Ellouise Class, NP Tinnie Redman, PharmD Jaun Bash, PharmD   Please be sure to bring in all your medications bottles to every appointment.    Thank you for choosing [facility]

## 2024-06-17 ENCOUNTER — Other Ambulatory Visit (HOSPITAL_COMMUNITY)
Admission: RE | Admit: 2024-06-17 | Discharge: 2024-06-17 | Disposition: A | Discharge status: Home / Self Care | Source: Ambulatory Visit | Attending: Internal Medicine | Admitting: Internal Medicine

## 2024-06-17 ENCOUNTER — Ambulatory Visit (HOSPITAL_COMMUNITY): Payer: Self-pay | Admitting: Internal Medicine

## 2024-06-17 DIAGNOSIS — I5022 Chronic systolic (congestive) heart failure: Secondary | ICD-10-CM | POA: Insufficient documentation

## 2024-06-17 LAB — BASIC METABOLIC PANEL WITH GFR
Anion gap: 11 (ref 5–15)
BUN: 21 mg/dL (ref 8–23)
CO2: 24 mmol/L (ref 22–32)
Calcium: 9.3 mg/dL (ref 8.9–10.3)
Chloride: 107 mmol/L (ref 98–111)
Creatinine, Ser: 0.95 mg/dL (ref 0.44–1.00)
GFR, Estimated: >60 mL/min (ref >60)
Glucose, Bld: 107 mg/dL — ABNORMAL HIGH (ref 70–99)
Potassium: 4.2 mmol/L (ref 3.5–5.1)
Sodium: 142 mmol/L (ref 135–145)

## 2024-07-03 ENCOUNTER — Telehealth (HOSPITAL_BASED_OUTPATIENT_CLINIC_OR_DEPARTMENT_OTHER): Payer: Self-pay | Admitting: *Deleted

## 2024-07-03 NOTE — Telephone Encounter (Signed)
   Name: Kathleen Velazquez  DOB: 01-Oct-1955  MRN: [id number]  Primary Cardiologist: Powell FORBES Sorrow, MD (Inactive)  Chart reviewed as part of pre-operative protocol coverage. The patient has an upcoming visit scheduled with Advanced HF service on 07/17/24 at which time clearance can be addressed in case there are any issues that would impact surgical recommendations.  Right total hip arthroplasty is not scheduled until 09/02/24 as below. I added preop FYI to appointment note so that provider is aware to address at time of outpatient visit.  Per office protocol the cardiology provider should forward their finalized clearance decision and recommendations regarding antiplatelet therapy to the requesting party below.    I will route this message as FYI to requesting party and remove this message from the preop box as separate preop APP input not needed at this time.   Please call with any questions.  Lanier Felty D Elexus Barman, NP  07/03/2024, 11:19 AM

## 2024-07-03 NOTE — Telephone Encounter (Signed)
   Pre-operative Risk Assessment    Patient Name: Kathleen Velazquez  DOB: [birth year] MRN: [id number]   Date of last office visit: 06/06/24 [facility] Date of next office visit: 07/17/24 [facility]   Request for Surgical Clearance    Procedure:  RIGHT TOTAL HIP ARTHROPLASTY  Date of Surgery:  Clearance 09/02/24                                Surgeon:  DR. DEMPSEY MOAN Surgeon's Group or Practice Name:  [facility] Phone number:  [phone] KERRI MAZE Fax number:  [phone]   Type of Clearance Requested:   - Medical    Type of Anesthesia:  CHOICE   Additional requests/questions:    Bonney Niels Jest   07/03/2024, 10:57 AM

## 2024-07-15 NOTE — Progress Notes (Signed)
 [facility] PROGRESS NOTE    Primary Care: Orpha Yancey LABOR, MD Primary Cardiologist: Powell FORBES Sorrow, MD (Inactive) AHF: Dr. Cherrie   HPI: 68 y.o. female with a hx of anxiety, HTN, chronic sinus tachycardia and systolic HF referred by Dr. Lonni for further HF evaluation.   Echo 11/2011 EF normal 65-70%, mild LVH, G1DD, mild MR. Normal RV Echo 07/2016 EF 55-60%, trivial MR, RV mildly reduced, trivial pericardial effusion, left pleural effusion   She presented to [facility] on [facility]/18/22 with right sided weakness and dyspnea. Stroke w/u was negative but she was found to be in acute CHF. BNP 1,370. Chest CT and V/Q scans both negative for PE. EKG showed SR 98 bpm w/ frequent PACs and PVCs. Also noted to have runs of NSVT on tele. 2D echo showed severe biventricular HF. LVEF 25-30%, RV severely reduced, GIIDD, mild-mod MR, mild AoV thickening and mod pericardial effusion. R/LHC c/w NICM, angiography w/ mild nonobstructive CAD (20% mLAD and 20% mLCx lesions). RHC with elevated right and left sided filling pressures, RA 16 mmHg, PAP 47/29, mPCWP 24, FICK CO 4.13, CI 2.58, PAPi 1.1. She apparently refused cMRI. She was diuresed w/ IV Lasix  and transitioned to PO. Placed on low dose GDMT. This was limited by soft BP. Discharged home on 4/21. D/c wt 130 lb.   She was referred to [facility] clinic and ultimately referred to the [facility]. GDMT titrated and referred to Dr. Bensimhon. Had f/u echo 9/24 that showed improved cardiac function. LVEF normalized, 65-70%, RV normal. Small pericardial effusion present.   Today she returns for AHF follow up with her daughter. Overall feeling alright. Denies palpitations, CP, dizziness, edema, or PND/Orthopnea. No SOB. Appetite good, does not watch what she eats. No fever or chills. Weight at home 148 pounds. Taking all medications. Denies ETOH, tobacco or drug use.    Cardiac Testing  Echo 11/20/2022 1. Very difficult study due to poor echo windows and lack of   visualization of LV endocardium. However, based on limited views, there is  severe biventricular failure with estimated LVEF ~25% (endocardium very  poorly visualized) and severe RV dysfunction.   2. Left ventricular ejection fraction, by estimation, is 25 to 30%. The  left ventricle has severely decreased function. Left ventricular  endocardial border not optimally defined to evaluate regional wall motion.  There is mild left ventricular  hypertrophy. Left ventricular diastolic parameters are consistent with  Grade II diastolic dysfunction (pseudonormalization).   3. Right ventricular systolic function is severely reduced. The right  ventricular size is mildly enlarged.   4. Left atrial size was mildly dilated.   5. The mitral valve is degenerative. Mild to moderate mitral valve  regurgitation. Moderate mitral annular calcification.   6. The aortic valve is tricuspid. There is mild calcification of the  aortic valve. There is mild thickening of the aortic valve. Aortic valve  regurgitation is not visualized. Aortic valve sclerosis/calcification is  present, without any evidence of  aortic stenosis.   7. The inferior vena cava is dilated in size with <50% respiratory  variability, suggesting right atrial pressure of 15 mmHg.   8. Moderate pericardial effusion. There is no evidence of cardiac  tamponade.   Comparison(s): No prior Echocardiogram.      Cath 11/22/2022   Mid LAD lesion is 20% stenosed.   Mid Cx lesion is 20% stenosed.   Mild non-obstructive disease in the mid Circumflex and mid LAD Moderate caliber non-dominant RCA with no disease.  Prominent Thebesian vein network noted on angiography Elevated right and left heart pressures Fick Cardiac Output 4.1 L/min Cardiac index 2.58     Echo 04/2023  1. Left ventricular ejection fraction, by estimation, is 65 to 70%. The  left ventricle has hyperdynamic function. The left ventricle has no  regional wall motion  abnormalities. Left ventricular diastolic parameters  are indeterminate. Elevated left atrial   pressure.   2. Right ventricular systolic function is normal. The right ventricular  size is normal. Tricuspid regurgitation signal is inadequate for assessing  PA pressure.   3. A small pericardial effusion is present. The pericardial effusion is  posterior to the left ventricle.   4. The mitral valve is normal in structure. Trivial mitral valve  regurgitation. No evidence of mitral stenosis.   5. The aortic valve is tricuspid. Aortic valve regurgitation is not  visualized. No aortic stenosis is present.   6. The inferior vena cava is normal in size with greater than 50%  respiratory variability, suggesting right atrial pressure of 3 mmHg.     Past Medical History:  Diagnosis Date   Anxiety    Arthritis    knees,    Ejection fraction    Normal, echo, June, 2013   GERD (gastroesophageal reflux disease)    Headache(784.0)    hx of migraines    History of hiatal hernia    Hypertension    Irritable bowel syndrome (IBS)    PONV (postoperative nausea and vomiting)    Sinus tachycardia    Persistent sinus tachycardia, June, 2013 ( prior monitor in 2007 had shown no significant abnormalities.)    Current Outpatient Medications  Medication Sig Dispense Refill   alendronate (FOSAMAX) 70 MG tablet Take 70 mg by mouth once a week. Take with a full glass of water on an empty stomach.     allopurinol  (ZYLOPRIM ) 100 MG tablet Take 100 mg by mouth daily. (Patient not taking: Reported on 06/06/2024)     empagliflozin  (JARDIANCE ) 10 MG TABS tablet Take 1 tablet (10 mg total) by mouth daily. 30 tablet 11   furosemide  (LASIX ) 20 MG tablet Take 1 tablet (20 mg total) by mouth 2 (two) times a week. 30 tablet 3   metoprolol  succinate (TOPROL -XL) 50 MG 24 hr tablet Take 1 tablet (50 mg total) by mouth daily. Take with or immediately following a meal. 30 tablet 11   oxyCODONE  10 MG TABS Take 1 tablet (10  mg total) by mouth every 6 (six) hours as needed for moderate pain (pain score 4-6) or severe pain (pain score 7-10). 5 tablet 0   potassium chloride  (KLOR-CON ) 10 MEQ tablet Take 1 tablet (10 mEq total) by mouth daily. 90 tablet 3   predniSONE  (DELTASONE ) 5 MG tablet Take 5 mg by mouth daily with breakfast.     rosuvastatin  (CRESTOR ) 10 MG tablet Take 1 tablet (10 mg total) by mouth daily. 30 tablet 2   spironolactone  (ALDACTONE ) 25 MG tablet Take 1 tablet (25 mg total) by mouth daily. 30 tablet 11   No current facility-administered medications for this visit.    Allergies  Allergen Reactions   Codeine Nausea And Vomiting      Social History   Socioeconomic History   Marital status: Widowed    Spouse name: Not on file   Number of children: 1   Years of education: Not on file   Highest education level: Not on file  Occupational History   Occupation: Retired  Tobacco Use  Smoking status: Never   Smokeless tobacco: Never  Vaping Use   Vaping status: Never Used  Substance and Sexual Activity   Alcohol use: Not Currently    Comment: recovering alcoholic x 7 years    Drug use: No   Sexual activity: Not Currently    Birth control/protection: Post-menopausal  Other Topics Concern   Not on file  Social History Narrative   Not on file   Social Drivers of Health   Financial Resource Strain: Low Risk  (08/24/2023)   Overall Financial Resource Strain (CARDIA)    Difficulty of Paying Living Expenses: Not hard at all  Food Insecurity: No Food Insecurity (08/24/2023)   Hunger Vital Sign    Worried About Running Out of Food in the Last Year: Never true    Ran Out of Food in the Last Year: Never true  Transportation Needs: No Transportation Needs (08/24/2023)   PRAPARE - Administrator, Civil Service (Medical): No    Lack of Transportation (Non-Medical): No  Physical Activity: Inactive (08/24/2023)   Exercise Vital Sign    Days of Exercise per Week: 0 days    Minutes  of Exercise per Session: 0 min  Stress: No Stress Concern Present (08/24/2023)   [employer] of Occupational Health - Occupational Stress Questionnaire    Feeling of Stress : Not at all  Social Connections: Socially Isolated (08/24/2023)   Social Connection and Isolation Panel    Frequency of Communication with Friends and Family: More than three times a week    Frequency of Social Gatherings with Friends and Family: More than three times a week    Attends Religious Services: Never    Database Administrator or Organizations: No    Attends Banker Meetings: Never    Marital Status: Widowed  Intimate Partner Violence: Not At Risk (08/24/2023)   Humiliation, Afraid, Rape, and Kick questionnaire    Fear of Current or Ex-Partner: No    Emotionally Abused: No    Physically Abused: No    Sexually Abused: No      Family History  Problem Relation Age of Onset   Irritable bowel syndrome Sister     There were no vitals filed for this visit.   PHYSICAL EXAM: General:  elderly appearing.  No respiratory difficulty. Arrived in [facility] Neck: JVD flat.  Cor: Regular rate & rhythm. No murmurs. Lungs: clear Extremities: no edema  Neuro: alert & oriented x 3. Affect pleasant.    EKG:  NSR with PAC 79 bpm (Personally reviewed)    ASSESSMENT & PLAN:  1. 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 LHC mild nonobstructive CAD - RHC w/ elevated R+ L filling pressures, preserved CO (mRA 16, PAP 47/29, mPCWP 24, CO 4.13, CI 2.58, PAPi 1.1)  - refused cMRI due to claustrophobia  - improved w/ GDMT. F/u Echo 9/24 w/ normalized LVEF, 65-70%, RV normal  - Chronic NYHA class II-IIIa, confounded by deconditioning. Not very functional at baseline. Limited by severe bilateral hip OA - Continue Jardiance  10 mg daily. No GU infections   - Off Losartan  with soft BP - Continue spironolactone  25 - Continue  Lasix  20 mg 3x/week. Check BMP/BNP today   - No longer on digoxin  w/ normalized EF  - Update echo for surgical clearance. If EF remains normal, can likely graduate from the [facility] soon and f/u with Cardiology in [place].  2. Pericardial Effusion  - moderate by echo 4/21, no tamponade physiology >>likely 2/2 high RA pressures  - repeat echo 9/24 showed small effusion   3. CAD - nonobstructive by cath 4/23 - mLAD 20% stenosis  - mLCx 20% stenosis  - denies CP  - on ? blocker + statin (LDL at goal, 43 mg/DL 5/74)   - not on ASA given issues w/ rectal bleeding   4. CKD IIIb - b/l Scr ~1.3 - on SGLT2i  - Check BMP   5. 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 Sees ortho mid Nov for surgical scheduling.  - Will check EKG, labs and echo (next available) today. Plan for full clearance at f/u.  - He is planning to undergo XXX (full procedure not posted yet) by Dr. Melodi at [facility]. His risk of perioperative cardiac complications using RCRI calculator is low (1.1%). No contraindication to proceed with surgery from cardiac standpoint.   6. Left Rotator Cuff Tear - shoulder MRI showed tear of supraspinatus tendon - followed by ortho   Follow up with APP in 3-4 weeks + echo for surgical clearance.   Harlene CHRISTELLA Gainer, FNP  3:18 PM

## 2024-07-16 ENCOUNTER — Telehealth (HOSPITAL_COMMUNITY): Payer: Self-pay

## 2024-07-16 NOTE — Telephone Encounter (Signed)
 Called to confirm/remind patient of their appointment at the [facility] on 07/17/24.   Appointment:   [x] Confirmed  [] Left mess   [] No answer/No voice mail  [] VM Full/unable to leave message  [] Phone not in service  Patient reminded to bring all medications and/or complete list.  Confirmed patient has transportation. Gave directions, instructed to utilize valet parking.

## 2024-07-17 ENCOUNTER — Ambulatory Visit (HOSPITAL_COMMUNITY)
Admission: RE | Admit: 2024-07-17 | Discharge: 2024-07-17 | Disposition: A | Discharge status: Home / Self Care | Source: Ambulatory Visit | Attending: Family Medicine | Admitting: Family Medicine

## 2024-07-17 ENCOUNTER — Encounter (HOSPITAL_COMMUNITY): Payer: Self-pay

## 2024-07-17 ENCOUNTER — Ambulatory Visit (HOSPITAL_BASED_OUTPATIENT_CLINIC_OR_DEPARTMENT_OTHER)
Admission: RE | Admit: 2024-07-17 | Discharge: 2024-07-17 | Disposition: A | Discharge status: Home / Self Care | Source: Ambulatory Visit | Attending: Internal Medicine | Admitting: Internal Medicine

## 2024-07-17 VITALS — BP 118/72 | HR 76 | Ht 62.0 in | Wt 155.4 lb

## 2024-07-17 DIAGNOSIS — I2510 Atherosclerotic heart disease of native coronary artery without angina pectoris: Secondary | ICD-10-CM | POA: Insufficient documentation

## 2024-07-17 DIAGNOSIS — I5022 Chronic systolic (congestive) heart failure: Secondary | ICD-10-CM | POA: Diagnosis present

## 2024-07-17 DIAGNOSIS — I472 Ventricular tachycardia, unspecified: Secondary | ICD-10-CM | POA: Insufficient documentation

## 2024-07-17 DIAGNOSIS — Z01818 Encounter for other preprocedural examination: Secondary | ICD-10-CM | POA: Diagnosis not present

## 2024-07-17 DIAGNOSIS — Z7983 Long term (current) use of bisphosphonates: Secondary | ICD-10-CM | POA: Insufficient documentation

## 2024-07-17 DIAGNOSIS — I082 Rheumatic disorders of both aortic and tricuspid valves: Secondary | ICD-10-CM | POA: Insufficient documentation

## 2024-07-17 DIAGNOSIS — Z79899 Other long term (current) drug therapy: Secondary | ICD-10-CM | POA: Insufficient documentation

## 2024-07-17 DIAGNOSIS — K625 Hemorrhage of anus and rectum: Secondary | ICD-10-CM | POA: Insufficient documentation

## 2024-07-17 DIAGNOSIS — I493 Ventricular premature depolarization: Secondary | ICD-10-CM | POA: Insufficient documentation

## 2024-07-17 DIAGNOSIS — I5082 Biventricular heart failure: Secondary | ICD-10-CM | POA: Insufficient documentation

## 2024-07-17 DIAGNOSIS — I428 Other cardiomyopathies: Secondary | ICD-10-CM | POA: Insufficient documentation

## 2024-07-17 DIAGNOSIS — Z7952 Long term (current) use of systemic steroids: Secondary | ICD-10-CM | POA: Insufficient documentation

## 2024-07-17 DIAGNOSIS — M160 Bilateral primary osteoarthritis of hip: Secondary | ICD-10-CM | POA: Insufficient documentation

## 2024-07-17 DIAGNOSIS — I130 Hypertensive heart and chronic kidney disease with heart failure and stage 1 through stage 4 chronic kidney disease, or unspecified chronic kidney disease: Secondary | ICD-10-CM | POA: Insufficient documentation

## 2024-07-17 DIAGNOSIS — I3139 Other pericardial effusion (noninflammatory): Secondary | ICD-10-CM | POA: Diagnosis not present

## 2024-07-17 DIAGNOSIS — Z604 Social exclusion and rejection: Secondary | ICD-10-CM | POA: Insufficient documentation

## 2024-07-17 DIAGNOSIS — N1832 Chronic kidney disease, stage 3b: Secondary | ICD-10-CM | POA: Insufficient documentation

## 2024-07-17 DIAGNOSIS — Z7984 Long term (current) use of oral hypoglycemic drugs: Secondary | ICD-10-CM | POA: Insufficient documentation

## 2024-07-17 DIAGNOSIS — F40240 Claustrophobia: Secondary | ICD-10-CM | POA: Insufficient documentation

## 2024-07-17 LAB — ECHOCARDIOGRAM COMPLETE
Area-P 1/2: 5.38 cm2
S' Lateral: 3.1 cm

## 2024-07-17 MED ORDER — PERFLUTREN LIPID MICROSPHERE
1.0000 mL | INTRAVENOUS | Status: DC | PRN
  Administered 2024-07-17: 2 mL via INTRAVENOUS

## 2024-07-17 NOTE — Patient Instructions (Addendum)
 Good to see you today!     Your physician recommends that you schedule a follow-up appointment 6 months(June ) Call office in April to schedule an appointment  If you have any questions or concerns before your next appointment please send us  a message through [place] or call our office at [phone].    TO LEAVE A MESSAGE FOR THE NURSE SELECT OPTION 2, PLEASE LEAVE A MESSAGE INCLUDING: YOUR NAME DATE OF BIRTH CALL BACK NUMBER REASON FOR CALL**this is important as we prioritize the call backs  YOU WILL RECEIVE A CALL BACK THE SAME DAY AS LONG AS YOU CALL BEFORE 4:00 PM At the [facility], you and your health needs are our priority. As part of our continuing mission to provide you with exceptional heart care, we have created designated Provider Care Teams. These Care Teams include your primary Cardiologist (physician) and Advanced Practice Providers (APPs- Physician Assistants and Nurse Practitioners) who all work together to provide you with the care you need, when you need it.   You may see any of the following providers on your designated Care Team at your next follow up: Dr Toribio Fuel Dr Ezra Shuck Dr. Morene Brownie Greig Mosses, NP Caffie Shed, [place] [facility], [place] Beckey Coe, NP Jordan Lee, NP Ellouise Class, NP Tinnie Redman, PharmD Jaun Bash, PharmD   Please be sure to bring in all your medications bottles to every appointment.    Thank you for choosing [facility]

## 2024-08-13 NOTE — H&P (Signed)
 " TOTAL HIP ADMISSION H&P  Patient is admitted for right total hip arthroplasty.  Subjective:  Chief Complaint: Right hip pain  HPI: Kathleen Velazquez, 69 y.o. female, has a history of pain and functional disability in the right hip due to arthritis and patient has failed non-surgical conservative treatments for greater than 12 weeks to include NSAID's and/or analgesics, corticosteriod injections, flexibility and strengthening excercises, use of assistive devices, and activity modification. Onset of symptoms was gradual, starting several years ago with gradually worsening course since that time. The patient noted no past surgery on the right hip. Patient currently rates pain in the right hip at 9 out of 10 with activity. Patient has night pain, worsening of pain with activity and weight bearing, trendelenberg gait, pain that interfers with activities of daily living, and pain with passive range of motion. Patient has evidence of Radiographs of the pelvis and lateral views of the right hip demonstrate severe osteonecrosis of both hips. The right femoral head is approximately 70% involved with osteonecrosis, collapse, and secondary degenerative changes. The left femoral head is approximately 30-40% involved with collapse. by imaging studies. This condition presents safety issues increasing the risk of falls. There is no current active infection.  Patient Active Problem List   Diagnosis Date Noted   Avascular necrosis of bone of hip (HCC) 08/11/2023   Left shoulder pain 08/11/2023   Chronic diastolic CHF (congestive heart failure) (HCC) 08/11/2023   History of CAD (coronary artery disease) 08/11/2023   GAD (generalized anxiety disorder) 08/11/2023   Hypothyroidism 08/11/2023   Avascular necrosis of bone of hip, unspecified laterality (HCC) 08/11/2023   Essential hypertension 08/11/2023   Transaminitis 08/11/2023   CKD (chronic kidney disease) stage 2, GFR 60-89 ml/min 08/11/2023   NICM (nonischemic  cardiomyopathy) (HCC) 11/27/2022   Acute combined systolic and diastolic heart failure (HCC) 11/21/2022   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   Gastrointestinal hemorrhage associated with anorectal source 11/21/2022   Hyperlipidemia 11/19/2022   Right sided weakness 11/18/2022   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   Abnormal CT of the abdomen 08/10/2016   Hypokalemia 07/27/2016   Generalized weakness 07/27/2016   Hypomagnesemia 07/27/2016   SVT (supraventricular tachycardia) 07/27/2016   Diarrhea 07/27/2016   IBS (irritable bowel syndrome) 07/27/2016   GERD (gastroesophageal reflux disease)    Headache    Arthritis    Anxiety    Ejection fraction    Sinus tachycardia    Palpitations 01/16/2012   Postop Acute blood loss anemia 11/29/2011   OA (osteoarthritis) of knee 11/28/2011    Past Medical History:  Diagnosis Date   Anxiety    Arthritis    knees,    Ejection fraction    Normal, echo, June, 2013   GERD (gastroesophageal reflux disease)    Headache(784.0)    hx of migraines    History of hiatal hernia    Hypertension    Irritable bowel syndrome (IBS)    PONV (postoperative nausea and vomiting)    Sinus tachycardia    Persistent sinus tachycardia, June, 2013 ( prior monitor in 2007 had shown no significant abnormalities.)    Past Surgical History:  Procedure Laterality Date   CHOLECYSTECTOMY     ESOPHAGOGASTRODUODENOSCOPY N/A 08/19/2016   Procedure: ESOPHAGOGASTRODUODENOSCOPY (EGD);  Surgeon: Claudis RAYMOND Rivet, MD;  Location: [facility];  Service: Endoscopy;  Laterality: N/A;  2:50   EYE SURGERY     cataract   HEMORRHOID SURGERY N/A  05/20/2013   Procedure: EXTENSIVE HEMORRHOIDECTOMY;  Surgeon: Oneil DELENA Budge, MD;  Location: [facility];  Service: General;  Laterality: N/A;   JOINT REPLACEMENT     right knee replacement    OTHER SURGICAL HISTORY     left breast biopsy - benign    OTHER SURGICAL HISTORY      hx of skin cancer surgery    RIGHT/LEFT HEART CATH AND CORONARY ANGIOGRAPHY N/A 11/22/2022   Procedure: RIGHT/LEFT HEART CATH AND CORONARY ANGIOGRAPHY;  Surgeon: Verlin Lonni BIRCH, MD;  Location: [facility];  Service: Cardiovascular;  Laterality: N/A;   TOTAL KNEE ARTHROPLASTY  11/28/2011   Procedure: TOTAL KNEE ARTHROPLASTY;  Surgeon: Dempsey LULLA Moan, MD;  Location: [facility];  Service: Orthopedics;  Laterality: Left;   TOTAL KNEE REVISION Right 02/26/2020   Procedure: TOTAL KNEE REVISION;  Surgeon: Moan Dempsey, MD;  Location: [facility];  Service: Orthopedics;  Laterality: Right;    TUBAL LIGATION      Prior to Admission medications  Medication Sig Start Date End Date Taking? Authorizing Provider  alendronate (FOSAMAX) 70 MG tablet Take 70 mg by mouth once a week. Take with a full glass of water on an empty stomach.    [provider]  allopurinol  (ZYLOPRIM ) 100 MG tablet Take 100 mg by mouth daily.    [provider]  empagliflozin  (JARDIANCE ) 10 MG TABS tablet Take 1 tablet (10 mg total) by mouth daily. 12/29/22   Hayes Beckey CROME, NP  furosemide  (LASIX ) 20 MG tablet Take 1 tablet (20 mg total) by mouth 2 (two) times a week. 06/06/24   Hayes Beckey CROME, NP  metoprolol  succinate (TOPROL -XL) 50 MG 24 hr tablet Take 1 tablet (50 mg total) by mouth daily. Take with or immediately following a meal. 12/29/22   Hayes Beckey CROME, NP  oxyCODONE  10 MG TABS Take 1 tablet (10 mg total) by mouth every 6 (six) hours as needed for moderate pain (pain score 4-6) or severe pain (pain score 7-10). 08/16/23   Odell Celinda Balo, MD  potassium chloride  (KLOR-CON ) 10 MEQ tablet Take 1 tablet (10 mEq total) by mouth daily. 11/24/23 06/06/25  Marcine Catalan M, PA-C  predniSONE  (DELTASONE ) 5 MG tablet Take 5 mg by mouth daily with breakfast.    [provider]  rosuvastatin  (CRESTOR ) 10 MG tablet Take 1 tablet (10 mg total) by mouth daily. 11/27/22   Akula, Vijaya, MD  spironolactone   (ALDACTONE ) 25 MG tablet Take 1 tablet (25 mg total) by mouth daily. 01/24/23   Bensimhon, Toribio SAUNDERS, MD    Allergies[1]  Social History   Socioeconomic History   Marital status: Widowed    Spouse name: Not on file   Number of children: 1   Years of education: Not on file   Highest education level: Not on file  Occupational History   Occupation: Retired  Tobacco Use   Smoking status: Never   Smokeless tobacco: Never  Vaping Use   Vaping status: Never Used  Substance and Sexual Activity   Alcohol use: Not Currently    Comment: recovering alcoholic x 7 years    Drug use: No   Sexual activity: Not Currently    Birth control/protection: Post-menopausal  Other Topics Concern   Not on file  Social History Narrative   Not on file   Social Drivers of Health   Tobacco Use: Low Risk (07/17/2024)   Patient History    Smoking Tobacco Use: Never    Smokeless Tobacco Use:  Never    Passive Exposure: Not on file  Financial Resource Strain: Low Risk (08/24/2023)   Overall Financial Resource Strain (CARDIA)    Difficulty of Paying Living Expenses: Not hard at all  Food Insecurity: No Food Insecurity (08/24/2023)   Hunger Vital Sign    Worried About Running Out of Food in the Last Year: Never true    Ran Out of Food in the Last Year: Never true  Transportation Needs: No Transportation Needs (08/24/2023)   PRAPARE - Administrator, Civil Service (Medical): No    Lack of Transportation (Non-Medical): No  Physical Activity: Inactive (08/24/2023)   Exercise Vital Sign    Days of Exercise per Week: 0 days    Minutes of Exercise per Session: 0 min  Stress: No Stress Concern Present (08/24/2023)   [employer] of Occupational Health - Occupational Stress Questionnaire    Feeling of Stress : Not at all  Social Connections: Socially Isolated (08/24/2023)   Social Connection and Isolation Panel    Frequency of Communication with Friends and Family: More than three times a week     Frequency of Social Gatherings with Friends and Family: More than three times a week    Attends Religious Services: Never    Database Administrator or Organizations: No    Attends Banker Meetings: Never    Marital Status: Widowed  Intimate Partner Violence: Not At Risk (08/24/2023)   Humiliation, Afraid, Rape, and Kick questionnaire    Fear of Current or Ex-Partner: No    Emotionally Abused: No    Physically Abused: No    Sexually Abused: No  Depression (PHQ2-9): Low Risk (08/24/2023)   Depression (PHQ2-9)    PHQ-2 Score: 0  Alcohol Screen: Low Risk (11/23/2022)   Alcohol Screen    Last Alcohol Screening Score (AUDIT): 0  Housing: Unknown (08/24/2023)   Housing Stability Vital Sign    Unable to Pay for Housing in the Last Year: No    Number of Times Moved in the Last Year: Not on file    Homeless in the Last Year: No  Utilities: Not At Risk (08/24/2023)   [facility] Utilities    Threatened with loss of utilities: No  Health Literacy: Adequate Health Literacy (08/24/2023)   B1300 Health Literacy    Frequency of need for help with medical instructions: Never    Tobacco Use: Low Risk (07/17/2024)   Patient History    Smoking Tobacco Use: Never    Smokeless Tobacco Use: Never    Passive Exposure: Not on file   Social History   Substance and Sexual Activity  Alcohol Use Not Currently   Comment: recovering alcoholic x 7 years     Family History  Problem Relation Age of Onset   Irritable bowel syndrome Sister     Review of Systems  Constitutional:  Negative for chills and fever.  Respiratory:  Negative for cough.   Cardiovascular:  Negative for chest pain.  Gastrointestinal:  Negative for abdominal pain.  Genitourinary:  Negative for dysuria.  Musculoskeletal:  Positive for joint pain.     Objective:  Physical Exam: - Constitutional: Female alert, sitting in a wheelchair, no apparent distress. Musculoskeletal: - Right hip: Flexion to 90 degrees, minimal  internal or external rotation, abduction to 20 degrees, all with significant pain. - Left hip: Flexion to 100 degrees, internal rotation to 20 degrees, external rotation to 30 degrees, abduction to 30 degrees, mild pain.  Imaging Review Radiographs of  the pelvis and lateral views of the right hip demonstrate severe osteonecrosis of both hips. The right femoral head is approximately 70% involved with osteonecrosis, collapse, and secondary degenerative changes. The left femoral head is approximately 30-40% involved with collapse.  Assessment/Plan:  End stage arthritis, right hip  The patient history, physical examination, clinical judgement of the provider and imaging studies are consistent with end stage degenerative joint disease of the right hip and total hip arthroplasty is deemed medically necessary. The treatment options including medical management, injection therapy, arthroscopy and arthroplasty were discussed at length. The risks and benefits of total hip arthroplasty were presented and reviewed. The risks due to aseptic loosening, infection, stiffness, dislocation/subluxation, thromboembolic complications and other imponderables were discussed. The patient acknowledged the explanation, agreed to proceed with the plan and consent was signed. Patient is being admitted for inpatient treatment for surgery, pain control, PT, OT, prophylactic antibiotics, VTE prophylaxis, progressive ambulation and ADLs and discharge planning.The patient is planning to be discharged Home .   Patient's anticipated LOS is less than 2 midnights, meeting these requirements: - Lives within 1 hour of care - Has a competent adult at home to recover with post-op recover - NO history of  - Diabetes  - Heart attack  - Stroke  - DVT/VTE  - Respiratory Failure/COPD  - Anemia  - Advanced Liver disease  Therapy Plans: HEP  Disposition: Daughter  Planned DVT Prophylaxis: 81mg  Aspirin   DME Needed: none PCP: Yancey Reno, MD (clearance pending* Cardiology: Toribio Lamar Fuel, MD (clearance recieved) TXA: IV  Allergies: NKDA Metal Allergies: none  Anesthesia Concerns: none  BMI: 28.3  Last HgbA1c: not diabetic  [facility] [address] Pain regimen: Hydromorphone , Tramadol , methocarbomol   Other: - Cardiac clearance in [facility] of last cardiology note visit on 07/17/24  - Hx of HF, CAD, CKD 3b - on Oxycodone  10mg  every 4 hours for pain management, does not fully manage her pain - on Predisone 5mg  once daily - need stress dose after surgery*   - Patient was instructed on what medications to stop prior to surgery. - Follow-up visit in 2 weeks with Dr. Melodi - Begin physical therapy following surgery - Pre-operative lab work as pre-surgical testing - Prescriptions will be provided in hospital at time of discharge  Waddell Sor, PA-C Orthopedic Surgery [facility] [place]      [1]  Allergies Allergen Reactions   Codeine Nausea And Vomiting   "

## 2024-08-19 NOTE — Progress Notes (Signed)
 COVID Vaccine received:  []  No [x]  Yes Date of any COVID positive Test in last 90 days:none  PCP - Yancey Reno, MD clearance scanned to [facility] 08-13-24 Cardiologist - Allan) Shelda Bruckner, MD HF- Rolan Fuel, MD, Harlene Gainer, FNP clearance in 07-17-24 Epic note  Chest x-ray - 08-11-2023 1v EKG -  06-06-2024 Stress Test -  ECHO - 07-17-2024   Cardiac Cath - 11-22-2022  [facility] by Dr. Verlin CT Coronary Calcium  score:   Pacemaker / ICD device [x]  No []  Yes   Spinal Cord Stimulator:[x]  No []  Yes       History of Sleep Apnea? [x]  No []  Yes   CPAP used?- [x]  No []  Yes    Medication on DOS: Metoprolol , Prednisone , Oxycodone  prn  Hold DOS: Spironolactone , Furosemide ,   Patient has: [x]  NO Hx DM   []  Pre-DM   []  DM1  []   DM2 Does the patient monitor blood sugar?   [x]  N/A   []  No []  Yes  Last A1c was: 5.6 on 08-11-2023      Empagliflozin  (Jardiance )- for Heart / renal   no hold  Blood Thinner / Instructions: none Aspirin  Instructions:   Activity level: Able to walk up 2 flights of stairs without becoming significantly short of breath or having chest pain?   []    Yes   [x]  No,  would have: rides a scooter,  Patient can perform ADLs without assistance.  [x]   Yes    []  No   Anesthesia review: HTN, chronic Pericardial effusion,  sinus tachycardia, HPpEF, mild non-obs. CAD, CKD3b, GERD, anxiety, PONV  Patient denies any S&S of respiratory illness or Covid - no shortness of breath, fever, cough or chest pain at PAT appointment.  Patient verbalized understanding and agreement to the Pre-Surgical Instructions that were given to them at this PAT appointment. Patient was also educated of the need to review these PAT instructions again prior to/her surgery.I reviewed the appropriate phone numbers to call if they have any and questions or concerns.

## 2024-08-20 NOTE — Patient Instructions (Signed)
 SURGICAL WAITING ROOM VISITATION Patients having surgery or a procedure may have no more than 2 support people in the waiting area - these visitors may rotate in the visitor waiting room.   If the patient needs to stay at the hospital during part of their recovery, the visitor guidelines for inpatient rooms apply.  PRE-OP VISITATION  Pre-op nurse will coordinate an appropriate time for 1 support person to accompany the patient in pre-op.  This support person may not rotate.  This visitor will be contacted when the time is appropriate for the visitor to come back in the pre-op area.  Please refer to the [facility] website for the visitor guidelines for Inpatients (after your surgery is over and you are in a regular room).  Temporary Visitor Restrictions  Children ages 7 and under will not be able to visit patients in [facility] under most circumstances. Visitation is not restricted outside of hospitals unless noted otherwise in the [facility] and Location Specific Visitation Guidelines at :      [email or web address] Visitors with respiratory illnesses are discouraged from visiting and should remain at home.  You are not required to quarantine at this time prior to your surgery. However, you must do this: Hand Hygiene often Do NOT share personal items Notify your provider if you are in close contact with someone who has COVID or you develop fever 100.4 or greater, new onset of sneezing, cough, sore throat, shortness of breath or body aches.  If you test positive for Covid or have been in contact with anyone that has tested positive in the last 10 days please notify you surgeon.    Your procedure is scheduled on:  MONDAY 09-02-2024  Report to [facility] Main Entrance: [address] entrance where the [employer] is available.   Report to admitting at: 11:00    AM  Call this number if you have any questions or problems the morning of surgery [phone]  Do not eat food  after Midnight the night prior to your surgery/procedure.  After Midnight you may have the following liquids until  10:30 AM DAY OF SURGERY  Clear Liquid Diet Water Black Coffee (sugar ok, NO MILK/CREAM OR CREAMERS)  Tea (sugar ok, NO MILK/CREAM OR CREAMERS) regular and decaf                             Plain Jell-O  with no fruit (NO RED)                                           Fruit ices (not with fruit pulp, NO RED)                                     Popsicles (NO RED)                                                                  Juice: NO CITRUS JUICES: only apple, WHITE grape, WHITE cranberry Sports drinks like Gatorade or Powerade (NO RED)  The day of surgery:  Drink ONE (1) Pre-Surgery Clear Ensure at 10:30 AM the morning of surgery. Drink in one sitting. Do not sip.  This drink was given to you during your hospital pre-op appointment visit. Nothing else to drink after completing the Pre-Surgery Clear Ensure : No candy, chewing gum or throat lozenges.    FOLLOW ANY ADDITIONAL PRE OP INSTRUCTIONS YOU RECEIVED FROM YOUR SURGEON'S OFFICE!!!   Oral Hygiene is also important to reduce your risk of infection.        Remember - BRUSH YOUR TEETH THE MORNING OF SURGERY WITH YOUR REGULAR TOOTHPASTE  Do NOT smoke after Midnight the night before surgery.  STOP TAKING all Vitamins, Herbs and supplements 1 week before your surgery.   Take ONLY these medicines the morning of surgery with A SIP OF WATER: Metoprolol , Prednisone , and Oxycodone  if needed.   DO NOT TAKE Jardiance , Spironolactone , Furosemide ,   You may not have any metal on your body including hair pins, jewelry, and body piercing  Do not wear make-up, lotions, powders, perfumes  or deodorant  Do not wear nail polish including gel and S&S, artificial / acrylic nails, or any other type of covering on natural nails including finger and toenails. If you have artificial nails, gel coating, etc., that  needs to be removed by a nail salon, Please have this removed prior to surgery. Not doing so may mean that your surgery could be cancelled or delayed if the Surgeon or anesthesia staff feels like they are unable to monitor you safely.   Do not shave 48 hours prior to surgery to avoid nicks in your skin which may contribute to postoperative infections.    Contacts, Hearing Aids, dentures or bridgework may not be worn into surgery. DENTURES WILL BE REMOVED PRIOR TO SURGERY PLEASE DO NOT APPLY Poly grip OR ADHESIVES!!!  You may bring a small overnight bag with you on the day of surgery, only pack items that are not valuable. [place] IS NOT RESPONSIBLE   FOR VALUABLES THAT ARE LOST OR STOLEN.   Do not bring your home medications to the hospital. The Pharmacy will dispense medications listed on your medication list to you during your admission in the Hospital.  Special Instructions: Bring a copy of your healthcare power of attorney and living will documents the day of surgery, if you wish to have them scanned into your  Medical Records- EPIC  Please read over the following fact sheets you were given: IF YOU HAVE QUESTIONS ABOUT YOUR PRE-OP INSTRUCTIONS, PLEASE CALL [phone].      Pre-operative 4 CHG Bath Instructions   You can play a key role in reducing the risk of infection after surgery. Your skin needs to be as free of germs as possible. You can reduce the number of germs on your skin by washing with CHG (chlorhexidine  gluconate) soap before surgery. CHG is an antiseptic soap that kills germs and continues to kill germs even after washing.   DO NOT use if you have an allergy to chlorhexidine /CHG or antibacterial soaps. If your skin becomes reddened or irritated, stop using the CHG and notify one of our RNs at [phone]  Please shower with the CHG soap starting 4 days before surgery using the following schedule: THURSDAY 08-29-2024     Do NOT use CHG soap  the morning of your                                                                                                                                 surgery.         Please keep in mind the following:  DO NOT shave, including legs and underarms, starting the day of your first shower.   You may shave your face at any point before/day of surgery.  Place clean sheets on your bed the day you start using CHG soap. Use a clean washcloth (not used since being washed) for each shower. DO NOT sleep with pets once you start using the CHG.  CHG Shower Instructions:  If you choose to wash your hair and private area, wash first with your normal shampoo/soap.  After you use shampoo/soap, rinse your hair and body thoroughly to remove shampoo/soap residue.  Turn the water OFF and apply about 3 tablespoons (45 ml) of CHG soap to a CLEAN washcloth.  Apply CHG soap ONLY FROM YOUR NECK DOWN TO YOUR TOES (washing for 3-5 minutes)  DO NOT use CHG soap on face, private areas, open wounds, or sores.  Pay special attention to the area where your surgery is being performed.  If you are having back surgery, having someone wash your back for you may be helpful. Wait 2 minutes after CHG soap is applied, then you may rinse off the CHG soap.  Pat dry with a clean towel  Put on clean clothes/pajamas   If you choose to wear lotion, please use ONLY the CHG-compatible lotions on the back of this paper.     Additional instructions for the day of surgery: DO NOT APPLY any CHG Soap,  lotions, deodorants, cologne, or perfumes on the day of surgery  Put on clean/comfortable clothes.  Brush your teeth.  Ask your nurse before applying any prescription medications to the skin.   CHG Compatible Lotions   Aveeno Moisturizing lotion  Cetaphil Moisturizing Cream  Cetaphil Moisturizing Lotion  Clairol Herbal Essence Moisturizing  Lotion, Dry Skin  Clairol Herbal Essence Moisturizing Lotion, Extra Dry Skin  Clairol Herbal Essence Moisturizing Lotion, Normal Skin  Curel Age Defying Therapeutic Moisturizing Lotion with Alpha Hydroxy  Curel Extreme Care Body Lotion  Curel Soothing Hands Moisturizing Hand Lotion  Curel Therapeutic Moisturizing Cream, Fragrance-Free  Curel Therapeutic Moisturizing Lotion, Fragrance-Free  Curel Therapeutic Moisturizing Lotion, Original Formula  Eucerin Daily Replenishing Lotion  Eucerin Dry Skin Therapy Plus Alpha Hydroxy Crme  Eucerin Dry Skin Therapy Plus Alpha Hydroxy Lotion  Eucerin Original Crme  Eucerin Original Lotion  Eucerin Plus Crme Eucerin Plus Lotion  Eucerin TriLipid Replenishing Lotion  Keri Anti-Bacterial Hand Lotion  Keri Deep Conditioning Original Lotion Dry Skin Formula Softly Scented  Keri Deep Conditioning Original Lotion, Fragrance Free Sensitive Skin Formula  Keri Lotion Fast Absorbing Fragrance Free Sensitive Skin Formula  Keri Lotion Fast Absorbing Softly Scented Dry Skin Formula  Keri Original Lobbyist Skin Renewal Lotion Keri Silky Smooth Lotion  Keri Silky Smooth Sensitive Skin Lotion  Nivea Body Creamy Conditioning Oil  Nivea Body Extra Enriched Teacher, Adult Education Moisturizing Lotion Nivea Crme  Nivea Skin Firming Lotion  NutraDerm 30 Skin Lotion  NutraDerm Skin Lotion  NutraDerm Therapeutic Skin Cream  NutraDerm Therapeutic Skin Lotion  ProShield Protective Hand Cream  Provon moisturizing lotion   FAILURE TO FOLLOW THESE INSTRUCTIONS MAY RESULT IN THE CANCELLATION OF YOUR SURGERY  PATIENT SIGNATURE_________________________________  NURSE SIGNATURE__________________________________  ________________________________________________________________________        Kathleen Velazquez    An incentive spirometer is a tool that can help keep your lungs clear and active. This tool measures how well  you are filling your lungs with each breath. Taking long deep breaths may help reverse or decrease the chance of developing breathing (pulmonary) problems (especially infection) following: A long period of time when you are unable to move or be active. BEFORE THE PROCEDURE  If the spirometer includes an indicator to show your best effort, your nurse or respiratory therapist will set it to a desired goal. If possible, sit up straight or lean slightly forward. Try not to slouch. Hold the incentive spirometer in an upright position. INSTRUCTIONS FOR USE  Sit on the edge of your bed if possible, or sit up as far as you can in bed or on a chair. Hold the incentive spirometer in an upright position. Breathe out normally. Place the mouthpiece in your mouth and seal your lips tightly around it. Breathe in slowly and as deeply as possible, raising the piston or the ball toward the top of the column. Hold your breath for 3-5 seconds or for as long as possible. Allow the piston or ball to fall to the bottom of the column. Remove the mouthpiece from your mouth and breathe out normally. Rest for a few seconds and repeat Steps 1 through 7 at least 10 times every 1-2 hours when you are awake. Take your time and take a few normal breaths between deep breaths. The spirometer may include an indicator to show your best effort. Use the indicator as a goal to work toward during each repetition. After each set of 10 deep breaths, practice coughing to be sure your lungs are clear. If you have an incision (the cut made at the time of surgery), support your incision when coughing by placing a pillow or rolled up towels firmly against it. Once you are able to get out of bed, walk around indoors and cough well. You may stop using the incentive spirometer when instructed by your caregiver.  RISKS AND COMPLICATIONS Take your time so you do not get dizzy or light-headed. If you are in pain, you may need to take or ask for  pain medication before doing incentive spirometry. It is harder to take a deep breath if you are having pain. AFTER USE Rest and breathe slowly and easily. It can be helpful to keep track of a log of your progress. Your caregiver can provide you with a simple table to help with this. If you are using the spirometer at home, follow these instructions: SEEK MEDICAL CARE IF:  You are having difficultly using the spirometer. You have trouble using the spirometer as often as instructed. Your pain medication is not giving enough relief while using the spirometer. You develop fever of 100.5 F (38.1 C) or higher.  SEEK IMMEDIATE MEDICAL CARE IF:  You cough up bloody sputum that had not been present before. You develop fever of 102 F (38.9 C) or greater. You develop worsening pain at or near the incision site. MAKE SURE YOU:  Understand these instructions. Will watch your condition. Will get help right away if you are not doing well or get worse. Document Released: 12/05/2006 Document Revised: 10/17/2011 Document Reviewed: 02/05/2007 [facility] Patient Information 2014 [place], [place].        WHAT IS A BLOOD TRANSFUSION? Blood Transfusion Information  A transfusion is the replacement of blood or some of its parts. Blood is made up of multiple cells which provide different functions. Red blood cells carry oxygen and are used for blood loss replacement. White blood cells fight against infection. Platelets control bleeding. Plasma helps clot blood. Other blood products are available for specialized needs, such as hemophilia or other clotting disorders. BEFORE THE TRANSFUSION  Who gives blood for transfusions?  Healthy volunteers who are fully evaluated to make sure their blood is safe. This is blood bank blood. Transfusion therapy is the safest it has ever been in the practice of medicine.  Before blood is taken from a donor, a complete history is taken to make sure that person has no history of diseases nor engages in risky social behavior (examples are intravenous drug use or sexual activity with multiple partners). The donor's travel history is screened to minimize risk of transmitting infections, such as malaria. The donated blood is tested for signs of infectious diseases, such as HIV and hepatitis. The blood is then tested to be sure it is compatible with you in order to minimize the chance of a transfusion reaction. If you or a relative donates blood, this is often done in anticipation of surgery and is not appropriate for emergency situations. It takes many days to process the donated blood. RISKS AND COMPLICATIONS Although transfusion therapy is very safe and saves many lives, the main dangers of transfusion include:  Getting an infectious disease. Developing a transfusion reaction. This is an allergic reaction to something in the blood you were given. Every precaution is taken to prevent this. The decision to have a blood transfusion has been considered carefully by your caregiver before blood is given. Blood is not given unless the benefits outweigh the risks. AFTER THE TRANSFUSION Right after receiving a blood transfusion, you will usually feel much better and more energetic. This is especially true if your red blood cells have gotten low (anemic). The transfusion raises the level of the red blood cells which carry oxygen, and this usually causes an energy increase. The nurse administering the transfusion will monitor you carefully for complications. HOME CARE INSTRUCTIONS  No special instructions are needed after a transfusion. You may find your energy is better. Speak with your caregiver about any limitations on activity for underlying diseases you may have. SEEK MEDICAL CARE IF:  Your condition is not improving after your transfusion. You develop redness or irritation at the  intravenous (IV) site. SEEK IMMEDIATE MEDICAL CARE IF:  Any of the following symptoms occur over the next 12 hours: Shaking chills. You have a temperature by mouth above 102 F (38.9 C), not controlled by medicine. Chest, back, or muscle pain. People around you feel you are not acting correctly or are confused. Shortness of breath or difficulty breathing. Dizziness and fainting. You get a rash or develop hives. You have a decrease in urine output. Your urine turns a dark color or changes  to pink, red, or brown. Any of the following symptoms occur over the next 10 days: You have a temperature by mouth above 102 F (38.9 C), not controlled by medicine. Shortness of breath. Weakness after normal activity. The white part of the eye turns yellow (jaundice). You have a decrease in the amount of urine or are urinating less often. Your urine turns a dark color or changes to pink, red, or brown. Document Released: 07/22/2000 Document Revised: 10/17/2011 Document Reviewed: 03/10/2008 [facility] Patient Information 2014 [place], [place].  _______________________________________________________________________        If you would like to see a video about joint replacement:   [email or web address]

## 2024-08-21 ENCOUNTER — Encounter (HOSPITAL_COMMUNITY)
Admission: RE | Admit: 2024-08-21 | Discharge: 2024-08-21 | Disposition: A | Discharge status: Home / Self Care | Source: Ambulatory Visit | Attending: Orthopedic Surgery | Admitting: Orthopedic Surgery

## 2024-08-21 ENCOUNTER — Other Ambulatory Visit: Payer: Self-pay

## 2024-08-21 ENCOUNTER — Encounter (HOSPITAL_COMMUNITY): Payer: Self-pay

## 2024-08-21 VITALS — BP 110/72 | HR 68 | Temp 98.3°F | Resp 16 | Ht 62.0 in | Wt 155.0 lb

## 2024-08-21 DIAGNOSIS — I5030 Unspecified diastolic (congestive) heart failure: Secondary | ICD-10-CM | POA: Diagnosis not present

## 2024-08-21 DIAGNOSIS — K449 Diaphragmatic hernia without obstruction or gangrene: Secondary | ICD-10-CM | POA: Diagnosis not present

## 2024-08-21 DIAGNOSIS — I428 Other cardiomyopathies: Secondary | ICD-10-CM | POA: Insufficient documentation

## 2024-08-21 DIAGNOSIS — Z01812 Encounter for preprocedural laboratory examination: Secondary | ICD-10-CM | POA: Insufficient documentation

## 2024-08-21 DIAGNOSIS — I110 Hypertensive heart disease with heart failure: Secondary | ICD-10-CM | POA: Insufficient documentation

## 2024-08-21 DIAGNOSIS — Z8679 Personal history of other diseases of the circulatory system: Secondary | ICD-10-CM

## 2024-08-21 DIAGNOSIS — K219 Gastro-esophageal reflux disease without esophagitis: Secondary | ICD-10-CM | POA: Diagnosis not present

## 2024-08-21 DIAGNOSIS — F419 Anxiety disorder, unspecified: Secondary | ICD-10-CM | POA: Insufficient documentation

## 2024-08-21 DIAGNOSIS — M1611 Unilateral primary osteoarthritis, right hip: Secondary | ICD-10-CM | POA: Diagnosis not present

## 2024-08-21 DIAGNOSIS — I2510 Atherosclerotic heart disease of native coronary artery without angina pectoris: Secondary | ICD-10-CM | POA: Diagnosis not present

## 2024-08-21 DIAGNOSIS — Z01818 Encounter for other preprocedural examination: Secondary | ICD-10-CM

## 2024-08-21 HISTORY — DX: Malignant (primary) neoplasm, unspecified: C80.1

## 2024-08-21 HISTORY — DX: Chronic kidney disease, unspecified: N18.9

## 2024-08-21 LAB — BASIC METABOLIC PANEL WITH GFR
Anion gap: 10 (ref 5–15)
BUN: 22 mg/dL (ref 8–23)
CO2: 26 mmol/L (ref 22–32)
Calcium: 9.4 mg/dL (ref 8.9–10.3)
Chloride: 106 mmol/L (ref 98–111)
Creatinine, Ser: 0.94 mg/dL (ref 0.44–1.00)
GFR, Estimated: >60 mL/min
Glucose, Bld: 90 mg/dL (ref 70–99)
Potassium: 3.8 mmol/L (ref 3.5–5.1)
Sodium: 141 mmol/L (ref 135–145)

## 2024-08-21 LAB — CBC
HCT: 44.5 % (ref 36.0–46.0)
Hemoglobin: 14.6 g/dL (ref 12.0–15.0)
MCH: 32.6 pg (ref 26.0–34.0)
MCHC: 32.8 g/dL (ref 30.0–36.0)
MCV: 99.3 fL (ref 80.0–100.0)
Platelets: 161 K/uL (ref 150–400)
RBC: 4.48 MIL/uL (ref 3.87–5.11)
RDW: 13.2 % (ref 11.5–15.5)
WBC: 8.5 K/uL (ref 4.0–10.5)
nRBC: 0 % (ref 0.0–0.2)

## 2024-08-21 LAB — TYPE AND SCREEN
ABO/RH(D): O POS
Antibody Screen: NEGATIVE

## 2024-08-21 LAB — SURGICAL PCR SCREEN
MRSA, PCR: NEGATIVE
Staphylococcus aureus: POSITIVE — AB

## 2024-08-21 NOTE — Progress Notes (Signed)
 Patient's PCR screen is positive for [facility]. Appropriate notes have been placed on the patient's chart. This note has been routed to Dr. Melodi / Corean Sender, PA for review. The Patient's surgery is currently scheduled for: 09-02-2024 at [facility].  Shawnee Aloe, BSN, CVRN-BC   Pre-Surgical Testing Nurse [facility]  [phone]

## 2024-08-24 NOTE — Anesthesia Preprocedure Evaluation (Addendum)
"                                    Anesthesia Evaluation  Patient identified by MRN, date of birth, ID band Patient awake    Reviewed: Allergy & Precautions, NPO status , Patient's Chart, lab work & pertinent test results  History of Anesthesia Complications (+) PONV and history of anesthetic complications  Airway Mallampati: III  TM Distance: >3 FB Neck ROM: Full    Dental   Pulmonary neg pulmonary ROS   breath sounds clear to auscultation       Cardiovascular hypertension, Pt. on medications +CHF   Rhythm:Regular Rate:Normal     Neuro/Psych negative neurological ROS     GI/Hepatic Neg liver ROS, hiatal hernia,GERD  ,,  Endo/Other  negative endocrine ROS    Renal/GU CRFRenal disease     Musculoskeletal  (+) Arthritis ,    Abdominal   Peds  Hematology  (+) Blood dyscrasia, anemia   Anesthesia Other Findings   Reproductive/Obstetrics                              Anesthesia Physical Anesthesia Plan  ASA: 3  Anesthesia Plan: Spinal and MAC   Post-op Pain Management: Ofirmev  IV (intra-op)*   Induction:   PONV Risk Score and Plan: 3 and Propofol  infusion, Ondansetron  and Dexamethasone   Airway Management Planned: Natural Airway and Simple Face Mask  Additional Equipment:   Intra-op Plan:   Post-operative Plan:   Informed Consent: I have reviewed the patients History and Physical, chart, labs and discussed the procedure including the risks, benefits and alternatives for the proposed anesthesia with the patient or authorized representative who has indicated his/her understanding and acceptance.       Plan Discussed with:   Anesthesia Plan Comments:          Anesthesia Quick Evaluation  "

## 2024-08-24 NOTE — Progress Notes (Signed)
 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 Has recovered EF by most recent echo in 07/2024. Last seen by NP [facility] in advance HF clinic on 07/17/24. Noted to be doing well. Advised f/u in 6 months. Cleared for surgery:  She is planning to undergo total hip arthroplasty by Dr. Melodi at [facility]. Her risk of perioperative cardiac complications using RCRI calculator is low (1.1%). No contraindication to proceed with surgery from cardiac standpoint.   VS: BP 110/72   Pulse 68   Temp 36.8 C (Oral)   Resp 16   Ht 5' 2 (1.575 m)   Wt 70.3 kg   SpO2 97%   BMI 28.35 kg/m   PROVIDERS: Orpha Yancey LABOR, MD   LABS: Labs reviewed: Acceptable for surgery. (all labs ordered are listed, but only abnormal results are displayed)  Labs Reviewed  SURGICAL PCR SCREEN - Abnormal; Notable for the following components:      Result Value   Staphylococcus aureus POSITIVE (*)    All other components within normal limits  BASIC METABOLIC PANEL WITH GFR  CBC  TYPE AND SCREEN     EKG 06/06/24:  SR with PACs T wave abnormality, consider lateral ischemia Prolonged QT  Echo 07/17/24:  IMPRESSIONS    1. Left ventricular ejection fraction, by estimation, is 55 to 60%. The left ventricle has normal function. The left ventricle has no regional wall motion abnormalities. Left ventricular diastolic parameters are indeterminate.  2. Right ventricular systolic function is normal. The right ventricular size is normal. Tricuspid regurgitation signal is inadequate for  assessing PA pressure.  3. A small pericardial effusion is present. The pericardial effusion is circumferential. There is no evidence of cardiac tamponade.  4. The mitral valve is normal in structure. No evidence of mitral valve regurgitation. No evidence of mitral stenosis.  5. The aortic valve is normal in structure. Aortic valve regurgitation is trivial. No aortic stenosis is present.  6. The inferior vena cava is normal in size with greater than 50% respiratory variability, suggesting right atrial pressure of 3 mmHg.  R/L heart cath 11/22/2022:    Mid LAD lesion is 20% stenosed.   Mid Cx lesion is 20% stenosed.   Mild non-obstructive disease in the mid Circumflex and mid LAD Moderate caliber non-dominant RCA with no disease.  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 She has received IV Lasix  this am. I will give another 40 mg of IV Lasix  tonight. Medical management of mild CAD.   Past Medical History:  Diagnosis Date   Anxiety    Arthritis    knees,    Cancer (HCC)    skin cancer on nose   Chronic kidney disease    Ejection fraction    Normal, echo, June, 2013   GERD (gastroesophageal reflux disease)    Headache(784.0)    hx of migraines    History of hiatal hernia    Hypertension  Irritable bowel syndrome (IBS)    PONV (postoperative nausea and vomiting)    Sinus tachycardia    Persistent sinus tachycardia, June, 2013 ( prior monitor in 2007 had shown no significant abnormalities.)    Past Surgical History:  Procedure Laterality Date   CHOLECYSTECTOMY     ESOPHAGOGASTRODUODENOSCOPY N/A 08/19/2016   Procedure: ESOPHAGOGASTRODUODENOSCOPY (EGD);  Surgeon: Claudis RAYMOND Rivet, MD;  [facility];  Service: Endoscopy;  Laterality: N/A;  2:50   EYE SURGERY     cataract   HEMORRHOID SURGERY N/A 05/20/2013    Procedure: EXTENSIVE HEMORRHOIDECTOMY;  Surgeon: Oneil DELENA Budge, MD;  Location: [facility];  Service: General;  Laterality: N/A;   JOINT REPLACEMENT     right knee replacement    OTHER SURGICAL HISTORY     left breast biopsy - benign    OTHER SURGICAL HISTORY     hx of skin cancer surgery    RIGHT/LEFT HEART CATH AND CORONARY ANGIOGRAPHY N/A 11/22/2022   Procedure: RIGHT/LEFT HEART CATH AND CORONARY ANGIOGRAPHY;  Surgeon: Verlin Lonni BIRCH, MD;  Location: [facility] INVASIVE CV LAB;  Service: Cardiovascular;  Laterality: N/A;   TOTAL KNEE ARTHROPLASTY  11/28/2011   Procedure: TOTAL KNEE ARTHROPLASTY;  Surgeon: Dempsey LULLA Moan, MD;  Location[facility];  Service: Orthopedics;  Laterality: Left;   TOTAL KNEE REVISION Right 02/26/2020   Procedure: TOTAL KNEE REVISION;  Surgeon: Moan Dempsey, MD;  Location[facility];  Service: Orthopedics;  Laterality: Right;    TUBAL LIGATION      MEDICATIONS:  alendronate (FOSAMAX) 70 MG tablet   empagliflozin  (JARDIANCE ) 10 MG TABS tablet   furosemide  (LASIX ) 20 MG tablet   metoprolol  succinate (TOPROL -XL) 50 MG 24 hr tablet   oxyCODONE  10 MG TABS   potassium chloride  (KLOR-CON ) 10 MEQ tablet   predniSONE  (DELTASONE ) 5 MG tablet   rosuvastatin  (CRESTOR ) 10 MG tablet   spironolactone  (ALDACTONE ) 25 MG tablet   No current facility-administered medications for this encounter.   Kathleen Velazquez [facility] Surgical Short Stay/Anesthesiology [facility] Phone [phone] 08/24/2024 6:59 PM       "

## 2024-09-02 ENCOUNTER — Inpatient Hospital Stay (HOSPITAL_COMMUNITY)
Admission: RE | Admit: 2024-09-02 | Discharge: 2024-09-04 | DRG: 470 | Disposition: A | Discharge status: Home / Self Care | Attending: Orthopedic Surgery | Admitting: Orthopedic Surgery

## 2024-09-02 ENCOUNTER — Ambulatory Visit (HOSPITAL_COMMUNITY): Admitting: Anesthesiology

## 2024-09-02 ENCOUNTER — Ambulatory Visit (HOSPITAL_COMMUNITY)

## 2024-09-02 ENCOUNTER — Ambulatory Visit (HOSPITAL_COMMUNITY): Payer: Self-pay | Admitting: Medical

## 2024-09-02 ENCOUNTER — Observation Stay (HOSPITAL_COMMUNITY)

## 2024-09-02 ENCOUNTER — Encounter (HOSPITAL_COMMUNITY): Payer: Self-pay | Admitting: Orthopedic Surgery

## 2024-09-02 ENCOUNTER — Other Ambulatory Visit: Payer: Self-pay

## 2024-09-02 ENCOUNTER — Encounter (HOSPITAL_COMMUNITY)
Admission: RE | Disposition: A | Discharge status: Home / Self Care | Payer: Self-pay | Source: Home / Self Care | Attending: Orthopedic Surgery

## 2024-09-02 DIAGNOSIS — M25751 Osteophyte, right hip: Secondary | ICD-10-CM | POA: Diagnosis present

## 2024-09-02 DIAGNOSIS — E039 Hypothyroidism, unspecified: Secondary | ICD-10-CM | POA: Diagnosis present

## 2024-09-02 DIAGNOSIS — N182 Chronic kidney disease, stage 2 (mild): Secondary | ICD-10-CM | POA: Diagnosis present

## 2024-09-02 DIAGNOSIS — E785 Hyperlipidemia, unspecified: Secondary | ICD-10-CM | POA: Diagnosis present

## 2024-09-02 DIAGNOSIS — M1611 Unilateral primary osteoarthritis, right hip: Secondary | ICD-10-CM | POA: Diagnosis not present

## 2024-09-02 DIAGNOSIS — Z9889 Other specified postprocedural states: Secondary | ICD-10-CM

## 2024-09-02 DIAGNOSIS — G43909 Migraine, unspecified, not intractable, without status migrainosus: Secondary | ICD-10-CM | POA: Diagnosis present

## 2024-09-02 DIAGNOSIS — Z9049 Acquired absence of other specified parts of digestive tract: Secondary | ICD-10-CM

## 2024-09-02 DIAGNOSIS — I428 Other cardiomyopathies: Secondary | ICD-10-CM | POA: Diagnosis present

## 2024-09-02 DIAGNOSIS — I5042 Chronic combined systolic (congestive) and diastolic (congestive) heart failure: Secondary | ICD-10-CM | POA: Diagnosis present

## 2024-09-02 DIAGNOSIS — F411 Generalized anxiety disorder: Secondary | ICD-10-CM | POA: Diagnosis present

## 2024-09-02 DIAGNOSIS — I2510 Atherosclerotic heart disease of native coronary artery without angina pectoris: Secondary | ICD-10-CM | POA: Diagnosis present

## 2024-09-02 DIAGNOSIS — I5022 Chronic systolic (congestive) heart failure: Secondary | ICD-10-CM | POA: Diagnosis not present

## 2024-09-02 DIAGNOSIS — M169 Osteoarthritis of hip, unspecified: Principal | ICD-10-CM | POA: Diagnosis present

## 2024-09-02 DIAGNOSIS — Z9851 Tubal ligation status: Secondary | ICD-10-CM

## 2024-09-02 DIAGNOSIS — K589 Irritable bowel syndrome without diarrhea: Secondary | ICD-10-CM | POA: Diagnosis present

## 2024-09-02 DIAGNOSIS — I5082 Biventricular heart failure: Secondary | ICD-10-CM | POA: Diagnosis present

## 2024-09-02 DIAGNOSIS — Z85828 Personal history of other malignant neoplasm of skin: Secondary | ICD-10-CM

## 2024-09-02 DIAGNOSIS — Z7952 Long term (current) use of systemic steroids: Secondary | ICD-10-CM

## 2024-09-02 DIAGNOSIS — Z604 Social exclusion and rejection: Secondary | ICD-10-CM | POA: Diagnosis present

## 2024-09-02 DIAGNOSIS — I130 Hypertensive heart and chronic kidney disease with heart failure and stage 1 through stage 4 chronic kidney disease, or unspecified chronic kidney disease: Secondary | ICD-10-CM | POA: Diagnosis present

## 2024-09-02 DIAGNOSIS — Z7983 Long term (current) use of bisphosphonates: Secondary | ICD-10-CM

## 2024-09-02 DIAGNOSIS — Z96653 Presence of artificial knee joint, bilateral: Secondary | ICD-10-CM | POA: Diagnosis present

## 2024-09-02 DIAGNOSIS — K219 Gastro-esophageal reflux disease without esophagitis: Secondary | ICD-10-CM | POA: Diagnosis present

## 2024-09-02 DIAGNOSIS — F1021 Alcohol dependence, in remission: Secondary | ICD-10-CM | POA: Diagnosis present

## 2024-09-02 DIAGNOSIS — Z7984 Long term (current) use of oral hypoglycemic drugs: Secondary | ICD-10-CM

## 2024-09-02 DIAGNOSIS — Z79899 Other long term (current) drug therapy: Secondary | ICD-10-CM

## 2024-09-02 DIAGNOSIS — M879 Osteonecrosis, unspecified: Secondary | ICD-10-CM | POA: Diagnosis present

## 2024-09-02 DIAGNOSIS — Z8719 Personal history of other diseases of the digestive system: Secondary | ICD-10-CM

## 2024-09-02 DIAGNOSIS — Z885 Allergy status to narcotic agent status: Secondary | ICD-10-CM

## 2024-09-02 DIAGNOSIS — Z7982 Long term (current) use of aspirin: Secondary | ICD-10-CM

## 2024-09-02 MED ORDER — CEFAZOLIN SODIUM-DEXTROSE 2-4 GM/100ML-% IV SOLN
2.0000 g | Freq: Four times a day (QID) | INTRAVENOUS | Status: AC
  Administered 2024-09-02 – 2024-09-03 (×2): 2 g via INTRAVENOUS
  Filled 2024-09-02 (×2): qty 100

## 2024-09-02 MED ORDER — PROPOFOL 10 MG/ML IV BOLUS
INTRAVENOUS | Status: DC | PRN
  Administered 2024-09-02: 75 ug/kg/min via INTRAVENOUS
  Administered 2024-09-02: 30 mg via INTRAVENOUS

## 2024-09-02 MED ORDER — ORAL CARE MOUTH RINSE: 15.0000 mL | Freq: Once | OROMUCOSAL | Status: AC

## 2024-09-02 MED ORDER — MENTHOL 3 MG MT LOZG: 1.0000 | LOZENGE | OROMUCOSAL | Status: DC | PRN

## 2024-09-02 MED ORDER — SPIRONOLACTONE 25 MG PO TABS
25.0000 mg | ORAL_TABLET | Freq: Every day | ORAL | Status: DC
  Administered 2024-09-03 – 2024-09-04 (×2): 25 mg via ORAL
  Filled 2024-09-02 (×2): qty 1

## 2024-09-02 MED ORDER — HYDROMORPHONE HCL 2 MG PO TABS
2.0000 mg | ORAL_TABLET | ORAL | Status: DC | PRN
  Administered 2024-09-03: 2 mg via ORAL
  Filled 2024-09-02 (×2): qty 1

## 2024-09-02 MED ORDER — PHENYLEPHRINE HCL-NACL 20-0.9 MG/250ML-% IV SOLN
INTRAVENOUS | Status: DC | PRN
  Administered 2024-09-02: 40 ug/min via INTRAVENOUS

## 2024-09-02 MED ORDER — FENTANYL CITRATE (PF) 50 MCG/ML IJ SOSY: 25.0000 ug | PREFILLED_SYRINGE | INTRAMUSCULAR | Status: DC | PRN

## 2024-09-02 MED ORDER — TRAMADOL HCL 50 MG PO TABS
50.0000 mg | ORAL_TABLET | Freq: Four times a day (QID) | ORAL | Status: DC
  Administered 2024-09-02 – 2024-09-04 (×8): 50 mg via ORAL
  Filled 2024-09-02 (×8): qty 1

## 2024-09-02 MED ORDER — LIDOCAINE 2% (20 MG/ML) 5 ML SYRINGE
INTRAMUSCULAR | Status: DC | PRN
  Administered 2024-09-02: 50 mg via INTRAVENOUS

## 2024-09-02 MED ORDER — MUPIROCIN 2 % EX OINT: 1.0000 | TOPICAL_OINTMENT | Freq: Two times a day (BID) | CUTANEOUS | 0 refills | Status: AC

## 2024-09-02 MED ORDER — HYDROMORPHONE HCL 1 MG/ML IJ SOLN
0.5000 mg | INTRAMUSCULAR | Status: DC | PRN
  Administered 2024-09-02 (×2): 1 mg via INTRAVENOUS
  Administered 2024-09-04: 0.5 mg via INTRAVENOUS
  Filled 2024-09-02 (×3): qty 1

## 2024-09-02 MED ORDER — ASPIRIN 81 MG PO CHEW
81.0000 mg | CHEWABLE_TABLET | Freq: Two times a day (BID) | ORAL | Status: DC
  Administered 2024-09-03 – 2024-09-04 (×3): 81 mg via ORAL
  Filled 2024-09-02 (×3): qty 1

## 2024-09-02 MED ORDER — DEXAMETHASONE SOD PHOSPHATE PF 10 MG/ML IJ SOLN: 8.0000 mg | Freq: Once | INTRAMUSCULAR | Status: DC

## 2024-09-02 MED ORDER — DEXAMETHASONE SOD PHOSPHATE PF 10 MG/ML IJ SOLN
INTRAMUSCULAR | Status: DC | PRN
  Administered 2024-09-02: 8 mg via INTRAVENOUS

## 2024-09-02 MED ORDER — PREDNISONE 5 MG PO TABS
5.0000 mg | ORAL_TABLET | Freq: Every day | ORAL | Status: DC
  Filled 2024-09-02: qty 1

## 2024-09-02 MED ORDER — METOCLOPRAMIDE HCL 5 MG/ML IJ SOLN: 5.0000 mg | Freq: Three times a day (TID) | INTRAMUSCULAR | Status: DC | PRN

## 2024-09-02 MED ORDER — LACTATED RINGERS IV SOLN: INTRAVENOUS | Status: DC

## 2024-09-02 MED ORDER — METOCLOPRAMIDE HCL 5 MG PO TABS: 5.0000 mg | ORAL_TABLET | Freq: Three times a day (TID) | ORAL | Status: DC | PRN

## 2024-09-02 MED ORDER — PREDNISONE 5 MG PO TABS
10.0000 mg | ORAL_TABLET | Freq: Two times a day (BID) | ORAL | Status: AC
  Administered 2024-09-03 (×2): 10 mg via ORAL
  Filled 2024-09-02: qty 1
  Filled 2024-09-02 (×2): qty 2

## 2024-09-02 MED ORDER — METOPROLOL SUCCINATE ER 50 MG PO TB24
50.0000 mg | ORAL_TABLET | Freq: Every day | ORAL | Status: DC
  Administered 2024-09-03 – 2024-09-04 (×2): 50 mg via ORAL
  Filled 2024-09-02 (×2): qty 1

## 2024-09-02 MED ORDER — CHLORHEXIDINE GLUCONATE 0.12 % MT SOLN
15.0000 mL | Freq: Once | OROMUCOSAL | Status: AC
  Administered 2024-09-02: 15 mL via OROMUCOSAL

## 2024-09-02 MED ORDER — DOCUSATE SODIUM 100 MG PO CAPS
100.0000 mg | ORAL_CAPSULE | Freq: Two times a day (BID) | ORAL | Status: DC
  Administered 2024-09-02 – 2024-09-04 (×4): 100 mg via ORAL
  Filled 2024-09-02 (×4): qty 1

## 2024-09-02 MED ORDER — MAGNESIUM CITRATE PO SOLN: 1.0000 | Freq: Once | ORAL | Status: DC | PRN

## 2024-09-02 MED ORDER — OXYCODONE HCL 5 MG PO TABS: 5.0000 mg | ORAL_TABLET | Freq: Once | ORAL | Status: DC | PRN

## 2024-09-02 MED ORDER — 0.9 % SODIUM CHLORIDE (POUR BTL) OPTIME
TOPICAL | Status: DC | PRN
  Administered 2024-09-02: 1000 mL

## 2024-09-02 MED ORDER — POTASSIUM CHLORIDE ER 10 MEQ PO TBCR
10.0000 meq | EXTENDED_RELEASE_TABLET | Freq: Every day | ORAL | Status: DC
  Administered 2024-09-03 – 2024-09-04 (×2): 10 meq via ORAL
  Filled 2024-09-02 (×3): qty 1

## 2024-09-02 MED ORDER — METHOCARBAMOL 500 MG PO TABS
500.0000 mg | ORAL_TABLET | Freq: Four times a day (QID) | ORAL | Status: DC | PRN
  Administered 2024-09-03 – 2024-09-04 (×4): 500 mg via ORAL
  Filled 2024-09-02 (×4): qty 1

## 2024-09-02 MED ORDER — SODIUM CHLORIDE 0.9 % IV SOLN: INTRAVENOUS | Status: DC

## 2024-09-02 MED ORDER — FENTANYL CITRATE (PF) 100 MCG/2ML IJ SOLN
INTRAMUSCULAR | Status: DC | PRN
  Administered 2024-09-02 (×2): 50 ug via INTRAVENOUS

## 2024-09-02 MED ORDER — METHOCARBAMOL 1000 MG/10ML IJ SOLN
500.0000 mg | Freq: Four times a day (QID) | INTRAMUSCULAR | Status: DC | PRN
  Administered 2024-09-02: 500 mg via INTRAVENOUS
  Filled 2024-09-02: qty 10

## 2024-09-02 MED ORDER — HYDROMORPHONE HCL 2 MG PO TABS
1.0000 mg | ORAL_TABLET | ORAL | Status: DC | PRN
  Administered 2024-09-03 (×2): 2 mg via ORAL
  Filled 2024-09-02 (×2): qty 1

## 2024-09-02 MED ORDER — CHLORHEXIDINE GLUCONATE 4 % EX SOLN: 1.0000 | CUTANEOUS | 1 refills | Status: AC

## 2024-09-02 MED ORDER — ACETAMINOPHEN 10 MG/ML IV SOLN
1000.0000 mg | Freq: Four times a day (QID) | INTRAVENOUS | Status: DC
  Administered 2024-09-02: 1000 mg via INTRAVENOUS
  Filled 2024-09-02: qty 100

## 2024-09-02 MED ORDER — POLYETHYLENE GLYCOL 3350 17 G PO PACK: 17.0000 g | PACK | Freq: Every day | ORAL | Status: DC | PRN

## 2024-09-02 MED ORDER — ACETAMINOPHEN 500 MG PO TABS
1000.0000 mg | ORAL_TABLET | Freq: Four times a day (QID) | ORAL | Status: AC
  Administered 2024-09-02 – 2024-09-03 (×3): 1000 mg via ORAL
  Filled 2024-09-02 (×4): qty 2

## 2024-09-02 MED ORDER — AMISULPRIDE (ANTIEMETIC) 5 MG/2ML IV SOLN: 10.0000 mg | Freq: Once | INTRAVENOUS | Status: DC | PRN

## 2024-09-02 MED ORDER — BUPIVACAINE IN DEXTROSE 0.75-8.25 % IT SOLN
INTRATHECAL | Status: DC | PRN
  Administered 2024-09-02: 1.6 mL via INTRATHECAL

## 2024-09-02 MED ORDER — EPHEDRINE SULFATE-NACL 50-0.9 MG/10ML-% IV SOSY
PREFILLED_SYRINGE | INTRAVENOUS | Status: DC | PRN
  Administered 2024-09-02: 5 mg via INTRAVENOUS

## 2024-09-02 MED ORDER — BUPIVACAINE-EPINEPHRINE (PF) 0.25% -1:200000 IJ SOLN
INTRAMUSCULAR | Status: DC | PRN
  Administered 2024-09-02: 30 mL via PERINEURAL

## 2024-09-02 MED ORDER — FENTANYL CITRATE (PF) 100 MCG/2ML IJ SOLN
INTRAMUSCULAR | Status: AC
  Filled 2024-09-02: qty 2

## 2024-09-02 MED ORDER — POVIDONE-IODINE 10 % EX SWAB: 2.0000 | Freq: Once | CUTANEOUS | Status: DC

## 2024-09-02 MED ORDER — ONDANSETRON HCL 4 MG/2ML IJ SOLN
INTRAMUSCULAR | Status: DC | PRN
  Administered 2024-09-02: 4 mg via INTRAVENOUS

## 2024-09-02 MED ORDER — CEFAZOLIN SODIUM-DEXTROSE 2-4 GM/100ML-% IV SOLN
2.0000 g | INTRAVENOUS | Status: AC
  Administered 2024-09-02: 2 g via INTRAVENOUS
  Filled 2024-09-02: qty 100

## 2024-09-02 MED ORDER — OXYCODONE HCL 5 MG/5ML PO SOLN: 5.0000 mg | Freq: Once | ORAL | Status: DC | PRN

## 2024-09-02 MED ORDER — EPHEDRINE 5 MG/ML INJ
INTRAVENOUS | Status: AC
  Filled 2024-09-02: qty 5

## 2024-09-02 MED ORDER — TRANEXAMIC ACID-NACL 1000-0.7 MG/100ML-% IV SOLN
1000.0000 mg | INTRAVENOUS | Status: AC
  Administered 2024-09-02: 1000 mg via INTRAVENOUS
  Filled 2024-09-02: qty 100

## 2024-09-02 MED ORDER — PHENOL 1.4 % MT LIQD: 1.0000 | OROMUCOSAL | Status: DC | PRN

## 2024-09-02 MED ORDER — ONDANSETRON HCL 4 MG PO TABS: 4.0000 mg | ORAL_TABLET | Freq: Four times a day (QID) | ORAL | Status: DC | PRN

## 2024-09-02 MED ORDER — PREDNISONE 5 MG PO TABS
5.0000 mg | ORAL_TABLET | Freq: Two times a day (BID) | ORAL | Status: DC
  Administered 2024-09-04: 5 mg via ORAL
  Filled 2024-09-02 (×2): qty 1

## 2024-09-02 MED ORDER — ONDANSETRON HCL 4 MG/2ML IJ SOLN
4.0000 mg | Freq: Four times a day (QID) | INTRAMUSCULAR | Status: DC | PRN
  Administered 2024-09-04: 4 mg via INTRAVENOUS
  Filled 2024-09-02: qty 2

## 2024-09-02 MED ORDER — BISACODYL 10 MG RE SUPP: 10.0000 mg | Freq: Every day | RECTAL | Status: DC | PRN

## 2024-09-02 MED ORDER — ROSUVASTATIN CALCIUM 10 MG PO TABS
10.0000 mg | ORAL_TABLET | Freq: Every day | ORAL | Status: DC
  Administered 2024-09-03 – 2024-09-04 (×2): 10 mg via ORAL
  Filled 2024-09-02 (×2): qty 1

## 2024-09-02 MED ORDER — ACETAMINOPHEN 325 MG PO TABS
325.0000 mg | ORAL_TABLET | Freq: Four times a day (QID) | ORAL | Status: DC | PRN
  Administered 2024-09-03 – 2024-09-04 (×2): 650 mg via ORAL
  Filled 2024-09-02 (×2): qty 2

## 2024-09-02 MED ORDER — BUPIVACAINE-EPINEPHRINE (PF) 0.25% -1:200000 IJ SOLN
INTRAMUSCULAR | Status: AC
  Filled 2024-09-02: qty 30

## 2024-09-02 MED ORDER — PREDNISONE 20 MG PO TABS
20.0000 mg | ORAL_TABLET | Freq: Every evening | ORAL | Status: AC
  Administered 2024-09-02: 20 mg via ORAL
  Filled 2024-09-02 (×2): qty 1

## 2024-09-02 NOTE — Interval H&P Note (Signed)
 History and Physical Interval Note:  09/02/2024 11:26 AM  Kathleen Velazquez  has presented today for surgery, with the diagnosis of Right hip osteoarthritis.  The various methods of treatment have been discussed with the patient and family. After consideration of risks, benefits and other options for treatment, the patient has consented to  Procedures: ARTHROPLASTY, HIP, TOTAL, ANTERIOR APPROACH (Right) as a surgical intervention.  The patient's history has been reviewed, patient examined, no change in status, stable for surgery.  I have reviewed the patient's chart and labs.  Questions were answered to the patient's satisfaction.     Dempsey Loreley Schwall

## 2024-09-02 NOTE — Transfer of Care (Signed)
 Immediate Anesthesia Transfer of Care Note  Patient: Kathleen Velazquez  Procedure(s) Performed: Procedures: ARTHROPLASTY, HIP, TOTAL, ANTERIOR APPROACH (Right)  Patient Location: PACU  Anesthesia Type:Spinal  Level of Consciousness:  sedated, patient cooperative and responds to stimulation  Airway & Oxygen Therapy:Patient Spontanous Breathing and Patient connected to face mask oxgen  Post-op Assessment:  Report given to PACU RN and Post -op Vital signs reviewed and stable  Post vital signs:  Reviewed and stable  Last Vitals:  Vitals:   09/02/24 1102  BP: 131/76  Pulse: 75  Resp: 16  Temp: 37.1 C  SpO2: 99%    Complications: No apparent anesthesia complications

## 2024-09-02 NOTE — Discharge Instructions (Addendum)
Kathleen Aluisio, MD Total Joint Specialist [facility] [address] [place], [place] [address] [phone]  ANTERIOR APPROACH TOTAL HIP REPLACEMENT POSTOPERATIVE DIRECTIONS     Hip Rehabilitation, Guidelines Following Surgery  The results of a hip operation are greatly improved after range of motion and muscle strengthening exercises. Follow all safety measures which are given to protect your hip. If any of these exercises cause increased pain or swelling in your joint, decrease the amount until you are comfortable again. Then slowly increase the exercises. Call your caregiver if you have problems or questions.   BLOOD CLOT PREVENTION Take an 81 mg Aspirin two times a day for three weeks following surgery. Then take an 81 mg Aspirin once a day for three weeks. Then discontinue Aspirin. You may resume your vitamins/supplements upon discharge from the hospital. Do not take any NSAIDs (Advil, Aleve, Ibuprofen, Meloxicam, etc.) until you are 3 weeks out from surgery  HOME CARE INSTRUCTIONS  Remove items at home which could result in a fall. This includes throw rugs or furniture in walking pathways.  ICE to the affected hip as frequently as 20-30 minutes an hour and then as needed for pain and swelling. Continue to use ice on the hip for pain and swelling from surgery. You may notice swelling that will progress down to the foot and ankle. This is normal after surgery. Elevate the leg when you are not up walking on it.   Continue to use the breathing machine which will help keep your temperature down.  It is common for your temperature to cycle up and down following surgery, especially at night when you are not up moving around and exerting yourself.  The breathing machine keeps your lungs expanded and your temperature down.  DIET You may resume your previous home diet once your are discharged from the hospital.  DRESSING / WOUND CARE / SHOWERING You have an  adhesive waterproof bandage over the incision. Leave this in place until your first follow-up appointment. Once you remove this you will not need to place another bandage.  You may begin showering 3 days following surgery, but do not submerge the incision under water.  ACTIVITY For the first 3-5 days, it is important to rest and keep the operative leg elevated. You should, as a general rule, rest for 50 minutes and walk/stretch for 10 minutes per hour. After 5 days, you may slowly increase activity as tolerated.  Perform the exercises you were provided twice a day for about 15-20 minutes each session. Begin these 2 days following surgery. Walk with your walker as instructed. Use the walker until you are comfortable transitioning to a cane. Walk with the cane in the opposite hand of the operative leg. You may discontinue the cane once you are comfortable and walking steadily. Avoid periods of inactivity such as sitting longer than an hour when not asleep. This helps prevent blood clots.  Do not drive a car for 6 weeks or until released by your surgeon.  Do not drive while taking narcotics.  TED HOSE STOCKINGS Wear the elastic stockings on both legs for three weeks following surgery during the day. You may remove them at night while sleeping.  WEIGHT BEARING Weight bearing as tolerated with assist device (walker, cane, etc) as directed, use it as long as suggested by your surgeon or therapist, typically at least 4-6 weeks.  POSTOPERATIVE CONSTIPATION PROTOCOL Constipation - defined medically as fewer than three stools per week and severe constipation as   less than one stool per week.  One of the most common issues patients have following surgery is constipation.  Even if you have a regular bowel pattern at home, your normal regimen is likely to be disrupted due to multiple reasons following surgery.  Combination of anesthesia, postoperative narcotics, change in appetite and fluid intake all can  affect your bowels.  In order to avoid complications following surgery, here are some recommendations in order to help you during your recovery period.  Colace (docusate) - Pick up an over-the-counter form of Colace or another stool softener and take twice a day as long as you are requiring postoperative pain medications.  Take with a full glass of water daily.  If you experience loose stools or diarrhea, hold the colace until you stool forms back up.  If your symptoms do not get better within 1 week or if they get worse, check with your doctor. Dulcolax (bisacodyl) - Pick up over-the-counter and take as directed by the product packaging as needed to assist with the movement of your bowels.  Take with a full glass of water.  Use this product as needed if not relieved by Colace only.  MiraLax (polyethylene glycol) - Pick up over-the-counter to have on hand.  MiraLax is a solution that will increase the amount of water in your bowels to assist with bowel movements.  Take as directed and can mix with a glass of water, juice, soda, coffee, or tea.  Take if you go more than two days without a movement.Do not use MiraLax more than once per day. Call your doctor if you are still constipated or irregular after using this medication for 7 days in a row.  If you continue to have problems with postoperative constipation, please contact the office for further assistance and recommendations.  If you experience "the worst abdominal pain ever" or develop nausea or vomiting, please contact the office immediatly for further recommendations for treatment.  ITCHING  If you experience itching with your medications, try taking only a single pain pill, or even half a pain pill at a time.  You can also use Benadryl over the counter for itching or also to help with sleep.   MEDICATIONS See your medication summary on the "After Visit Summary" that the nursing staff will review with you prior to discharge.  You may have some home  medications which will be placed on hold until you complete the course of blood thinner medication.  It is important for you to complete the blood thinner medication as prescribed by your surgeon.  Continue your approved medications as instructed at time of discharge.  PRECAUTIONS If you experience chest pain or shortness of breath - call 911 immediately for transfer to the hospital emergency department.  If you develop a fever greater that 101 F, purulent drainage from wound, increased redness or drainage from wound, foul odor from the wound/dressing, or calf pain - CONTACT YOUR SURGEON.                                                   FOLLOW-UP APPOINTMENTS Make sure you keep all of your appointments after your operation with your surgeon and caregivers. You should call the office at the above phone number and make an appointment for approximately two weeks after the date of your surgery or on the   date instructed by your surgeon outlined in the "After Visit Summary".  RANGE OF MOTION AND STRENGTHENING EXERCISES  These exercises are designed to help you keep full movement of your hip joint. Follow your caregiver's or physical therapist's instructions. Perform all exercises about fifteen times, three times per day or as directed. Exercise both hips, even if you have had only one joint replacement. These exercises can be done on a training (exercise) mat, on the floor, on a table or on a bed. Use whatever works the best and is most comfortable for you. Use music or television while you are exercising so that the exercises are a pleasant break in your day. This will make your life better with the exercises acting as a break in routine you can look forward to.  Lying on your back, slowly slide your foot toward your buttocks, raising your knee up off the floor. Then slowly slide your foot back down until your leg is straight again.  Lying on your back spread your legs as far apart as you can without causing  discomfort.  Lying on your side, raise your upper leg and foot straight up from the floor as far as is comfortable. Slowly lower the leg and repeat.  Lying on your back, tighten up the muscle in the front of your thigh (quadriceps muscles). You can do this by keeping your leg straight and trying to raise your heel off the floor. This helps strengthen the largest muscle supporting your knee.  Lying on your back, tighten up the muscles of your buttocks both with the legs straight and with the knee bent at a comfortable angle while keeping your heel on the floor.   POST-OPERATIVE OPIOID TAPER INSTRUCTIONS: It is important to wean off of your opioid medication as soon as possible. If you do not need pain medication after your surgery it is ok to stop day one. Opioids include: Codeine, Hydrocodone(Norco, Vicodin), Oxycodone(Percocet, oxycontin) and hydromorphone amongst others.  Long term and even short term use of opiods can cause: Increased pain response Dependence Constipation Depression Respiratory depression And more.  Withdrawal symptoms can include Flu like symptoms Nausea, vomiting And more Techniques to manage these symptoms Hydrate well Eat regular healthy meals Stay active Use relaxation techniques(deep breathing, meditating, yoga) Do Not substitute Alcohol to help with tapering If you have been on opioids for less than two weeks and do not have pain than it is ok to stop all together.  Plan to wean off of opioids This plan should start within one week post op of your joint replacement. Maintain the same interval or time between taking each dose and first decrease the dose.  Cut the total daily intake of opioids by one tablet each day Next start to increase the time between doses. The last dose that should be eliminated is the evening dose.   IF YOU ARE TRANSFERRED TO A SKILLED REHAB FACILITY If the patient is transferred to a skilled rehab facility following release from the  hospital, a list of the current medications will be sent to the facility for the patient to continue.  When discharged from the skilled rehab facility, please have the facility set up the patient's Home Health Physical Therapy prior to being released. Also, the skilled facility will be responsible for providing the patient with their medications at time of release from the facility to include their pain medication, the muscle relaxants, and their blood thinner medication. If the patient is still at the rehab facility   at time of the two week follow up appointment, the skilled rehab facility will also need to assist the patient in arranging follow up appointment in our office and any transportation needs.  MAKE SURE YOU:  Understand these instructions.  Get help right away if you are not doing well or get worse.    DENTAL ANTIBIOTICS:  In most cases prophylactic antibiotics for Dental procdeures after total joint surgery are not necessary.  Exceptions are as follows:  1. History of prior total joint infection  2. Severely immunocompromised (Organ Transplant, cancer chemotherapy, Rheumatoid biologic meds such as Humera)  3. Poorly controlled diabetes (A1C &gt; 8.0, blood glucose over 200)  If you have one of these conditions, contact your surgeon for an antibiotic prescription, prior to your dental procedure.    Pick up stool softner and laxative for home use following surgery while on pain medications. Do not submerge incision under water. Please use good hand washing techniques while changing dressing each day. May shower starting three days after surgery. Please use a clean towel to pat the incision dry following showers. Continue to use ice for pain and swelling after surgery. Do not use any lotions or creams on the incision until instructed by your surgeon.  

## 2024-09-02 NOTE — Anesthesia Postprocedure Evaluation (Signed)
"   Anesthesia Post Note  Patient: ARIONNE IAMS  Procedure(s) Performed: ARTHROPLASTY, HIP, TOTAL, ANTERIOR APPROACH (Right: Hip)     Patient location during evaluation: PACU Anesthesia Type: Spinal and MAC Level of consciousness: awake and alert Pain management: pain level controlled Vital Signs Assessment: post-procedure vital signs reviewed and stable Respiratory status: spontaneous breathing and respiratory function stable Cardiovascular status: blood pressure returned to baseline and stable Postop Assessment: spinal receding Anesthetic complications: no   No notable events documented.  Last Vitals:  Vitals:   09/02/24 1659 09/02/24 1704  BP: 106/69 106/69  Pulse: 70 70  Resp:    Temp: (!) 36.4 C (!) 36.4 C  SpO2: 100% 100%    Last Pain:  Vitals:   09/02/24 1704  TempSrc: Axillary  PainSc:                  Epifanio Lamar BRAVO      "

## 2024-09-02 NOTE — Anesthesia Procedure Notes (Signed)
 Spinal  Patient location during procedure: OR Start time: 09/02/2024 1:42 PM End time: 09/02/2024 1:48 PM Reason for block: surgical anesthesia  Staffing Performed: anesthesiologist  Authorized by: Epifanio Charleston, MD   Performed by: Epifanio Charleston, MD  Preanesthetic Checklist Completed: patient identified, IV checked, site marked, risks and benefits discussed, surgical consent, monitors and equipment checked, pre-op evaluation and timeout performed Spinal Block Patient position: sitting Prep: DuraPrep Patient monitoring: heart rate, cardiac monitor, continuous pulse ox and blood pressure Approach: right paramedian Location: L4-5 Injection technique: single-shot Needle Needle type: Quincke  Needle gauge: 22 G Needle length: 9 cm Assessment Sensory level: T4 Events: CSF return  Additional Notes X2 attempts at midline unsuccessful 2/2 arthritis. 2nd attempt at right paramedian approach successful.

## 2024-09-02 NOTE — Op Note (Signed)
 "     OPERATIVE REPORT- TOTAL HIP ARTHROPLASTY   PREOPERATIVE DIAGNOSIS: Osteoarthritis of the Right hip.   POSTOPERATIVE DIAGNOSIS: Osteoarthritis of the Right  hip.   PROCEDURE: Right total hip arthroplasty, anterior approach.   SURGEON: Dempsey Moan, MD   ASSISTANT: Waddell Sor, PA-C  ANESTHESIA:  Spinal  ESTIMATED BLOOD LOSS:-150 mL    DRAINS: None  COMPLICATIONS: None   CONDITION: PACU - hemodynamically stable.   BRIEF CLINICAL NOTE: Kathleen Velazquez is a 69 y.o. female who has advanced end-  stage arthritis of their Right  hip with progressively worsening pain and  dysfunction.The patient has failed nonoperative management and presents for  total hip arthroplasty.   PROCEDURE IN DETAIL: After successful administration of spinal  anesthetic, the traction boots for the [facility] bed were placed on both  feet and the patient was placed onto the [facility] bed, boots placed into the leg  holders. The Right hip was then isolated from the perineum with plastic  drapes and prepped and draped in the usual sterile fashion. ASIS and  greater trochanter were marked and a oblique incision was made, starting  at about 1 cm lateral and 2 cm distal to the ASIS and coursing towards  the anterior cortex of the femur. The skin was cut with a 10 blade  through subcutaneous tissue to the level of the fascia overlying the  tensor fascia lata muscle. The fascia was then incised in line with the  incision at the junction of the anterior third and posterior 2/3rd. The  muscle was teased off the fascia and then the interval between the TFL  and the rectus was developed. The Hohmann retractor was then placed at  the top of the femoral neck over the capsule. The vessels overlying the  capsule were cauterized and the fat on top of the capsule was removed.  A Hohmann retractor was then placed anterior underneath the rectus  femoris to give exposure to the entire anterior capsule. A T-shaped   capsulotomy was performed. The edges were tagged and the femoral head  was identified.       Osteophytes are removed off the superior acetabulum.  The femoral neck was then cut in situ with an oscillating saw. Traction  was then applied to the left lower extremity utilizing the [facility]  traction. The femoral head was then removed. Retractors were placed  around the acetabulum and then circumferential removal of the labrum was  performed. Osteophytes were also removed. Reaming starts at 45 mm to  medialize and  Increased in 2 mm increments to 47 mm. We reamed in  approximately 40 degrees of abduction, 20 degrees anteversion. A 48 mm  pinnacle acetabular shell was then impacted in anatomic position under  fluoroscopic guidance with excellent purchase. We did not need to place  any additional dome screws. A 28 mm neutral + 4 Altrx liner was then  placed into the acetabular shell.       The femoral lift was then placed along the lateral aspect of the femur  just distal to the vastus ridge. The leg was  externally rotated and capsule  was stripped off the inferior aspect of the femoral neck down to the  level of the lesser trochanter, this was done with electrocautery. The femur was lifted after this was performed. The  leg was then placed in an extended and adducted position essentially delivering the femur. We also removed the capsule superiorly and the piriformis from the piriformis fossa  to gain excellent exposure of the  proximal femur. Rongeur was used to remove some cancellous bone to get  into the lateral portion of the proximal femur for placement of the  initial starter reamer. The starter broaches was placed  the starter broach  and was shown to go down the center of the canal. Broaching  with the Actis system was then performed starting at size 0  coursing  Up to size 4. A size 4 had excellent torsional and rotational  and axial stability. The trial standard offset neck was then placed   with a 28 + 1.5 trial head. The hip was then reduced. We confirmed that  the stem was in the canal both on AP and lateral x-rays. It also has excellent sizing. The hip was reduced with outstanding stability through full extension and full external rotation.. AP pelvis was taken and the leg lengths were measured and found to be equal. Hip was then dislocated again and the femoral head and neck removed. The  femoral broach was removed. Size 4 Actis stem with a standard offset  neck was then impacted into the femur following native anteversion. Has  excellent purchase in the canal. Excellent torsional and rotational and  axial stability. It is confirmed to be in the canal on AP and lateral  fluoroscopic views. The 28 + 1.5 ceramic head was placed and the hip  reduced with outstanding stability. Again AP pelvis was taken and it  confirmed that the leg lengths were equal. The wound was then copiously  irrigated with saline solution and the capsule reattached and repaired  with Ethibond suture. 30 ml of .25% Bupivicaine was  injected into the capsule and into the edge of the tensor fascia lata as well as subcutaneous tissue. The fascia overlying the tensor fascia lata was then closed with a running #1 V-Loc. Subcu was closed with interrupted 2-0 Vicryl and subcuticular running 4-0 Monocryl. Incision was cleaned  and dried. Steri-Strips and a bulky sterile dressing applied. The patient was awakened and transported to  recovery in stable condition.        Please note that a surgical assistant was a medical necessity for this procedure to perform it in a safe and expeditious manner. Assistant was necessary to provide appropriate retraction of vital neurovascular structures and to prevent femoral fracture and allow for anatomic placement of the prosthesis.  Dempsey Moan, M.D.    "

## 2024-09-03 ENCOUNTER — Encounter (HOSPITAL_COMMUNITY): Payer: Self-pay | Admitting: Orthopedic Surgery

## 2024-09-03 LAB — BASIC METABOLIC PANEL WITH GFR
Anion gap: 10 (ref 5–15)
BUN: 17 mg/dL (ref 8–23)
CO2: 22 mmol/L (ref 22–32)
Calcium: 8.8 mg/dL — ABNORMAL LOW (ref 8.9–10.3)
Chloride: 104 mmol/L (ref 98–111)
Creatinine, Ser: 0.89 mg/dL (ref 0.44–1.00)
GFR, Estimated: >60 mL/min
Glucose, Bld: 141 mg/dL — ABNORMAL HIGH (ref 70–99)
Potassium: 4.7 mmol/L (ref 3.5–5.1)
Sodium: 136 mmol/L (ref 135–145)

## 2024-09-03 LAB — CBC
HCT: 41.5 % (ref 36.0–46.0)
Hemoglobin: 13.9 g/dL (ref 12.0–15.0)
MCH: 32.5 pg (ref 26.0–34.0)
MCHC: 33.5 g/dL (ref 30.0–36.0)
MCV: 97 fL (ref 80.0–100.0)
Platelets: 144 10*3/uL — ABNORMAL LOW (ref 150–400)
RBC: 4.28 MIL/uL (ref 3.87–5.11)
RDW: 12.9 % (ref 11.5–15.5)
WBC: 16.6 10*3/uL — ABNORMAL HIGH (ref 4.0–10.5)
nRBC: 0 % (ref 0.0–0.2)

## 2024-09-03 MED ORDER — MUPIROCIN 2 % EX OINT
1.0000 | TOPICAL_OINTMENT | Freq: Two times a day (BID) | CUTANEOUS | Status: DC
  Administered 2024-09-03 – 2024-09-04 (×3): 1 via NASAL
  Filled 2024-09-03: qty 22

## 2024-09-03 MED ORDER — CHLORHEXIDINE GLUCONATE CLOTH 2 % EX PADS
6.0000 | MEDICATED_PAD | Freq: Every day | CUTANEOUS | Status: DC
  Administered 2024-09-03 – 2024-09-04 (×2): 6 via TOPICAL

## 2024-09-03 NOTE — Care Management Obs Status (Signed)
 MEDICARE OBSERVATION STATUS NOTIFICATION   Patient Details  Name: SKYYLAR KOPF MRN: [id number] Date of Birth: Jan 08, 1956   Medicare Observation Status Notification Given:  Yes    Alfonse JONELLE Rex, RN 09/03/2024, 10:38 AM

## 2024-09-03 NOTE — Progress Notes (Signed)
 Physical Therapy Treatment Patient Details Name: Kathleen Velazquez MRN: [id number] DOB: 10-May-1956 Today's Date: 09/03/2024   History of Present Illness Patient is 69 y.o. female s/p Rt THA on 09/02/24 with PMH significant for HTN, GERD, OA, anxiety, Lt TKA in 2013, R TKA 2021.    PT Comments  PT resting in recliner upon PT arrival, agreeable to session. Reviewed HEP, instructed pt on use of gait belt to assist with exercises as needed. Pt frequently holds her breath with all activity, education on inhale with easy part of exercises and exhale on hard part of exercise; continues to requires VC for breathing sequencing with all activity during session. MOD A to stand from recliner and ambulate 67ft with RW, VC for step sequencing and knee flexion to allow for proper swing phase. She continues to be limited significantly by pain with all mobility.     If plan is discharge home, recommend the following: A little help with walking and/or transfers;A little help with bathing/dressing/bathroom;Assistance with cooking/housework;Assist for transportation   Can travel by private vehicle        Equipment Recommendations  None recommended by PT    Recommendations for Other Services       Precautions / Restrictions Precautions Precautions: None Recall of Precautions/Restrictions: Intact Restrictions Weight Bearing Restrictions Per Provider Order: Yes RLE Weight Bearing Per Provider Order: Weight bearing as tolerated     Mobility  Bed Mobility Overal bed mobility: Needs Assistance Bed Mobility: Sit to Supine     Supine to sit: Mod assist, Used rails, HOB elevated Sit to supine: Min assist   General bed mobility comments: pt seated in recliner upon PT arrival, assisted back to bed at end of sesion. Instructed pt on use of gait belt to assist with bringing RLE into bed, difficulty sequencing task due to pain requiring MIN A for RLE    Transfers Overall transfer level: Needs  assistance Equipment used: Rolling walker (2 wheels) Transfers: Sit to/from Stand Sit to Stand: Mod assist           General transfer comment: VC for proper hand placement and positioning of RLE, heavy assistance to stand from recliner secondary to increased pain with weight acceptance onto RLE    Ambulation/Gait Ambulation/Gait assistance: Mod assist Gait Distance (Feet): 15 Feet Assistive device: Rolling walker (2 wheels) Gait Pattern/deviations: Antalgic, Decreased weight shift to right, Decreased step length - right, Decreased stance time - right, Step-to pattern Gait velocity: decreased     General Gait Details: VC and demonstration for sequencing of steps for pt to amb from recliner to opposite side of bed. Initially scooting foot along floor and improve to able to swing leg however requires compensation from low back due to decreased hip and knee flexion. Standing rest breaks due to generalized fatigue and BUE muscular fatigue.   Stairs             Wheelchair Mobility     Tilt Bed    Modified Rankin (Stroke Patients Only)       Balance Overall balance assessment: Needs assistance Sitting-balance support: Bilateral upper extremity supported, Feet supported Sitting balance-Leahy Scale: Fair     Standing balance support: During functional activity, Reliant on assistive device for balance, Bilateral upper extremity supported Standing balance-Leahy Scale: Poor Standing balance comment: reliant on RW in standing, additional assistance for balance due to pain levels  Communication Communication Communication: No apparent difficulties  Cognition Arousal: Alert Behavior During Therapy: WFL for tasks assessed/performed   PT - Cognitive impairments: No apparent impairments                         Following commands: Intact      Cueing Cueing Techniques: Verbal cues, Gestural cues, Tactile cues  Exercises  Total Joint Exercises Ankle Circles/Pumps: AROM, Both, 10 reps, Seated Quad Sets: AROM, Right, 10 reps, Seated Heel Slides: AAROM, Right, 10 reps, Seated (uses gait belt) Hip ABduction/ADduction: AAROM, Right, 10 reps, Seated (uses gait belt) Straight Leg Raises: AAROM, Right, 5 reps, Seated    General Comments        Pertinent Vitals/Pain Pain Assessment Pain Assessment: 0-10 Pain Score: 7  Pain Location: RLE Pain Descriptors / Indicators: Dull, Grimacing, Discomfort, Throbbing, Sharp, Moaning, Jabbing Pain Intervention(s): Limited activity within patient's tolerance, Monitored during session, Premedicated before session, Repositioned, Ice applied    Home Living                          Prior Function            PT Goals (current goals can now be found in the care plan section) Acute Rehab PT Goals Patient Stated Goal: get back home PT Goal Formulation: With patient Time For Goal Achievement: 09/17/24 Potential to Achieve Goals: Good Progress towards PT goals: Progressing toward goals    Frequency    7X/week      PT Plan      Co-evaluation              AM-PAC PT 6 Clicks Mobility   Outcome Measure  Help needed turning from your back to your side while in a flat bed without using bedrails?: A Little Help needed moving from lying on your back to sitting on the side of a flat bed without using bedrails?: A Little Help needed moving to and from a bed to a chair (including a wheelchair)?: A Lot Help needed standing up from a chair using your arms (e.g., wheelchair or bedside chair)?: A Lot Help needed to walk in hospital room?: A Lot Help needed climbing 3-5 steps with a railing? : Total 6 Click Score: 13    End of Session Equipment Utilized During Treatment: Gait belt Activity Tolerance: Patient limited by pain Patient left: in bed;with call bell/phone within reach Nurse Communication: Mobility status PT Visit Diagnosis: Muscle weakness  (generalized) (M62.81);Difficulty in walking, not elsewhere classified (R26.2);Unsteadiness on feet (R26.81);Pain Pain - Right/Left: Right Pain - part of body: Hip     Time: 8655-8585 PT Time Calculation (min) (ACUTE ONLY): 30 min  Charges:    $Gait Training: 8-22 mins $Therapeutic Exercise: 8-22 mins                       Isaiah DEL. Shakenya Stoneberg, PT, DPT    [employer] 09/03/2024, 2:22 PM

## 2024-09-03 NOTE — Plan of Care (Signed)
 ?  Problem: Clinical Measurements: ?Goal: Will remain free from infection ?Outcome: Progressing ?  ?

## 2024-09-03 NOTE — Evaluation (Signed)
 Physical Therapy Evaluation Patient Details Name: Kathleen Velazquez MRN: [id number] DOB: [birth year] Today's Date: 09/03/2024  History of Present Illness  Patient is 69 y.o. female s/p Rt THA on 09/02/24 with PMH significant for HTN, GERD, OA, anxiety, Lt TKA in 2013, R TKA 2021.  Clinical Impression  Pt presents for physical therapy evaluation following above noted surgerical intervention. Prior to admission, she was IND with mobility using a RW in the home and an art gallery manager for community distances. She lives with her daughter, daughter's partner and 2 grandsons who are available to provide assistance and transportation as needed. Pt currently requires MOD A for bed mobility and transfers with RW. She is limited by elevated pain levels which worsen with mobility, decreased functional strength and ROM to RLE, and decreased activity tolerance; see below for additional deficits. PT to follow physician's discharge recommendations for follow up. Pt may benefit from HHPT in addition to physician's protocol due to limited mobility secondary to pain.       If plan is discharge home, recommend the following: A little help with walking and/or transfers;A little help with bathing/dressing/bathroom;Assistance with cooking/housework;Assist for transportation   Can travel by private vehicle        Equipment Recommendations None recommended by PT  Recommendations for Other Services       Functional Status Assessment Patient has had a recent decline in their functional status and/or demonstrates limited ability to make significant improvements in function in a reasonable and predictable amount of time     Precautions / Restrictions Precautions Precautions: None Recall of Precautions/Restrictions: Intact Restrictions Weight Bearing Restrictions Per Provider Order: Yes RLE Weight Bearing Per Provider Order: Weight bearing as tolerated      Mobility  Bed Mobility Overal bed mobility: Needs  Assistance Bed Mobility: Supine to Sit, Sit to Supine     Supine to sit: Mod assist, Used rails, HOB elevated Sit to supine: Mod assist, HOB elevated, Used rails   General bed mobility comments: Increased time and effort secondary to pain, PT assist with all movement of RLE secondary to pain. Education on PLB to assist with pain management while mobilizing    Transfers Overall transfer level: Needs assistance Equipment used: Rolling walker (2 wheels) Transfers: Sit to/from Stand Sit to Stand: Mod assist           General transfer comment: MOD A sit<>stand from EOB, VC for hand placement and positioning of RLE to assist with ease and pain during transfer.    Ambulation/Gait Ambulation/Gait assistance: Mod assist Gait Distance (Feet): 2 Feet (lateral steps towards head of bed) Assistive device: Rolling walker (2 wheels) Gait Pattern/deviations: Antalgic, Decreased weight shift to right, Decreased step length - right, Decreased stance time - right, Step-to pattern Gait velocity: decreased     General Gait Details: increased pain with WB on RLE, decreased hip and knee flexion to take step, assist for balance and movement of RW  Stairs            Wheelchair Mobility     Tilt Bed    Modified Rankin (Stroke Patients Only)       Balance Overall balance assessment: Needs assistance Sitting-balance support: Bilateral upper extremity supported, Feet supported Sitting balance-Leahy Scale: Fair     Standing balance support: During functional activity, Reliant on assistive device for balance, Bilateral upper extremity supported Standing balance-Leahy Scale: Poor Standing balance comment: reliant on RW in standing, additional assistance for balance due to pain levels  Pertinent Vitals/Pain Pain Assessment Pain Assessment: 0-10 Pain Score: 10-Worst pain ever Pain Location: RLE Pain Descriptors / Indicators: Dull, Grimacing,  Discomfort Pain Intervention(s): Limited activity within patient's tolerance, Monitored during session, Repositioned, Premedicated before session, Ice applied    Home Living Family/patient expects to be discharged to:: Private residence Living Arrangements: Children Available Help at Discharge: Available PRN/intermittently Type of Home: House Home Access: Stairs to enter;Level entry       Home Layout: Two level;Able to live on main level with bedroom/bathroom (pt lives in the basement, level entry, family lives upstairs) Home Equipment: Agricultural Consultant (2 wheels);Electric scooter;Shower seat;Grab bars - tub/shower;Transport chair;Hospital bed Additional Comments: pt reports showering 1/month due to fear of falling, daughter and her partner work out of the home    Prior Function Prior Level of Function : Independent/Modified Independent             Mobility Comments: has not driven in over a year, ambulates with RW in the home, RW or electric scooter for community distances ADLs Comments: IND with ADLs, sponge baths, daughter and partner grocery shop, help with cooking and help with house chores     Extremity/Trunk Assessment   Upper Extremity Assessment Upper Extremity Assessment: Overall WFL for tasks assessed    Lower Extremity Assessment Lower Extremity Assessment: Generalized weakness;RLE deficits/detail RLE Deficits / Details: knee and ankle ROM WFL, limited at R hip ROM secondary to pain; unable to complete full MMT secondary to pain RLE: Unable to fully assess due to pain RLE Sensation: WNL       Communication   Communication Communication: No apparent difficulties    Cognition Arousal: Alert Behavior During Therapy: WFL for tasks assessed/performed   PT - Cognitive impairments: No apparent impairments                         Following commands: Intact       Cueing Cueing Techniques: Verbal cues, Gestural cues, Tactile cues     General  Comments General comments (skin integrity, edema, etc.): echymosis noted around incision, SCDs to BLE and ice to incision for pain management    Exercises     Assessment/Plan    PT Assessment Patient needs continued PT services  PT Problem List Decreased strength;Decreased range of motion;Pain;Decreased balance;Decreased mobility;Decreased activity tolerance       PT Treatment Interventions Gait training;Balance training;Patient/family education;Therapeutic activities;Therapeutic exercise;Functional mobility training    PT Goals (Current goals can be found in the Care Plan section)  Acute Rehab PT Goals Patient Stated Goal: get back home PT Goal Formulation: With patient Time For Goal Achievement: 09/17/24 Potential to Achieve Goals: Good    Frequency 7X/week     Co-evaluation               AM-PAC PT 6 Clicks Mobility  Outcome Measure Help needed turning from your back to your side while in a flat bed without using bedrails?: A Lot Help needed moving from lying on your back to sitting on the side of a flat bed without using bedrails?: A Lot Help needed moving to and from a bed to a chair (including a wheelchair)?: A Lot Help needed standing up from a chair using your arms (e.g., wheelchair or bedside chair)?: A Lot Help needed to walk in hospital room?: Total Help needed climbing 3-5 steps with a railing? : Total 6 Click Score: 10    End of Session Equipment Utilized During Treatment: Gait belt Activity  Tolerance: Patient limited by pain Patient left: in bed;with call bell/phone within reach Nurse Communication: Mobility status PT Visit Diagnosis: Muscle weakness (generalized) (M62.81);Difficulty in walking, not elsewhere classified (R26.2);Unsteadiness on feet (R26.81);Pain Pain - Right/Left: Right Pain - part of body: Hip    Time: 9083-9052 PT Time Calculation (min) (ACUTE ONLY): 31 min   Charges:   PT Evaluation $PT Eval Low Complexity: 1 Low PT  Treatments $Therapeutic Activity: 8-22 mins           Isaiah DEL. Melvin Whiteford, PT, DPT   [employer] 09/03/2024, 10:03 AM

## 2024-09-03 NOTE — Plan of Care (Signed)
" °  Problem: Education: Goal: Knowledge of General Education information will improve Description: Including pain rating scale, medication(s)/side effects and non-pharmacologic comfort measures Outcome: Progressing   Problem: Health Behavior/Discharge Planning: Goal: Ability to manage health-related needs will improve Outcome: Progressing   Problem: Clinical Measurements: Goal: Ability to maintain clinical measurements within normal limits will improve Outcome: Progressing Goal: Will remain free from infection Outcome: Progressing Goal: Diagnostic test results will improve Outcome: Progressing Goal: Respiratory complications will improve Outcome: Progressing Goal: Cardiovascular complication will be avoided Outcome: Progressing   Problem: Activity: Goal: Risk for activity intolerance will decrease Outcome: Progressing   Problem: Nutrition: Goal: Adequate nutrition will be maintained Outcome: Progressing   Problem: Coping: Goal: Level of anxiety will decrease Outcome: Progressing   Problem: Elimination: Goal: Will not experience complications related to bowel motility Outcome: Progressing Goal: Will not experience complications related to urinary retention Outcome: Progressing   Problem: Pain Managment: Goal: General experience of comfort will improve and/or be controlled Outcome: Progressing   Problem: Safety: Goal: Ability to remain free from injury will improve Outcome: Progressing   Problem: Skin Integrity: Goal: Risk for impaired skin integrity will decrease Outcome: Progressing   Problem: Education: Goal: Knowledge of the prescribed therapeutic regimen will improve Outcome: Progressing   Problem: Bowel/Gastric: Goal: Gastrointestinal status for postoperative course will improve Outcome: Progressing   Problem: Cardiac: Goal: Ability to maintain an adequate cardiac output Outcome: Progressing Goal: Will show no evidence of cardiac arrhythmias Outcome:  Progressing   Problem: Nutritional: Goal: Will attain and maintain optimal nutritional status Outcome: Progressing   Problem: Neurological: Goal: Will regain or maintain usual level of consciousness Outcome: Progressing   Problem: Clinical Measurements: Goal: Ability to maintain clinical measurements within normal limits Outcome: Progressing Goal: Postoperative complications will be avoided or minimized Outcome: Progressing   Problem: Respiratory: Goal: Will regain and/or maintain adequate ventilation Outcome: Progressing Goal: Respiratory status will improve Outcome: Progressing   Problem: Skin Integrity: Goal: Demonstrates signs of wound healing without infection Outcome: Progressing   Problem: Urinary Elimination: Goal: Will remain free from infection Outcome: Progressing Goal: Ability to achieve and maintain adequate urine output Outcome: Progressing   Problem: Education: Goal: Knowledge of the prescribed therapeutic regimen will improve Outcome: Progressing Goal: Understanding of discharge needs will improve Outcome: Progressing Goal: Individualized Educational Video(s) Outcome: Progressing   Problem: Activity: Goal: Ability to avoid complications of mobility impairment will improve Outcome: Progressing Goal: Ability to tolerate increased activity will improve Outcome: Progressing   Problem: Clinical Measurements: Goal: Postoperative complications will be avoided or minimized Outcome: Progressing   Problem: Pain Management: Goal: Pain level will decrease with appropriate interventions Outcome: Progressing   Problem: Skin Integrity: Goal: Will show signs of wound healing Outcome: Progressing   "

## 2024-09-03 NOTE — TOC Transition Note (Signed)
 Transition of Care [facility]) - Discharge Note   Patient Details  Name: Kathleen Velazquez MRN: [id number] Date of Birth: [birth year]  Transition of [facility]) CM/SW Contact:  Alfonse JONELLE Rex, RN Phone Number: 09/03/2024, 11:25 AM   Clinical Narrative:  Met with patient at bedside to review dc therapy and home equipment needs, pt confirmed HEP, reports she has RW at home. MOON reviewed with patient and her daughter on mobile speaker phone per pt's request, form signed, copy provided to patient. No CM needs      Final next level of care: Home/Self Care Barriers to Discharge: No Barriers Identified   Patient Goals and CMS Choice Patient states their goals for this hospitalization and ongoing recovery are:: return home          Discharge Placement                       Discharge Plan and Services Additional resources added to the After Visit Summary for                                       Social Drivers of Health (SDOH) Interventions SDOH Screenings   Food Insecurity: No Food Insecurity (09/02/2024)  Housing: Low Risk (09/02/2024)  Transportation Needs: No Transportation Needs (09/02/2024)  Utilities: Not At Risk (09/02/2024)  Alcohol Screen: Low Risk (11/23/2022)  Depression (PHQ2-9): Low Risk (08/24/2023)  Financial Resource Strain: Low Risk (08/24/2023)  Physical Activity: Inactive (08/24/2023)  Social Connections: Socially Isolated (09/02/2024)  Stress: No Stress Concern Present (08/24/2023)  Tobacco Use: Low Risk (09/02/2024)  Health Literacy: Adequate Health Literacy (08/24/2023)     Readmission Risk Interventions     No data to display

## 2024-09-03 NOTE — Progress Notes (Signed)
" ° °  Subjective: 1 Day Post-Op Procedures (LRB): ARTHROPLASTY, HIP, TOTAL, ANTERIOR APPROACH (Right) Patient reports pain as moderate.   Patient seen in rounds by Dr. Melodi. Patient is doing fair other than increased pain. On chronic pain management, postoperative pain control was expected to be difficult. Denies chest pain/SOB.  We will begin therapy today.   Objective: Vital signs in last 24 hours: Temp:  [96.8 F (36 C)-98.7 F (37.1 C)] 97.4 F (36.3 C) (01/27 0510) Pulse Rate:  [65-89] 80 (01/27 0510) Resp:  [12-18] 16 (01/27 0510) BP: (76-131)/(46-76) 111/71 (01/27 0510) SpO2:  [95 %-100 %] 97 % (01/27 0510) Weight:  [70.3 kg] 70.3 kg (01/26 1102)  Intake/Output from previous day:  Intake/Output Summary (Last 24 hours) at 09/03/2024 0833 Last data filed at 09/03/2024 0600 Gross per 24 hour  Intake 3005.27 ml  Output 1900 ml  Net 1105.27 ml     Intake/Output this shift: No intake/output data recorded.  Labs: Recent Labs    09/03/24 0512  HGB 13.9   Recent Labs    09/03/24 0512  WBC 16.6*  RBC 4.28  HCT 41.5  PLT 144*   Recent Labs    09/03/24 0512  NA 136  K 4.7  CL 104  CO2 22  BUN 17  CREATININE 0.89  GLUCOSE 141*  CALCIUM  8.8*   No results for input(s): LABPT, INR in the last 72 hours.  Exam: General - Patient is Alert and Oriented Extremity - Neurologically intact Neurovascular intact Sensation intact distally Dorsiflexion/Plantar flexion intact Dressing - dressing C/D/I Motor Function - intact, moving foot and toes well on exam.   Past Medical History:  Diagnosis Date   Anxiety    Arthritis    knees,    Cancer (HCC)    skin cancer on nose   Chronic kidney disease    Ejection fraction    Normal, echo, June, 2013   GERD (gastroesophageal reflux disease)    Headache(784.0)    hx of migraines    History of hiatal hernia    Hypertension    Irritable bowel syndrome (IBS)    PONV (postoperative nausea and vomiting)    Sinus  tachycardia    Persistent sinus tachycardia, June, 2013 ( prior monitor in 2007 had shown no significant abnormalities.)    Assessment/Plan: 1 Day Post-Op Procedures (LRB): ARTHROPLASTY, HIP, TOTAL, ANTERIOR APPROACH (Right) Principal Problem:   OA (osteoarthritis) of hip Active Problems:   Osteoarthritis of right hip  Estimated body mass index is 28.35 kg/m as calculated from the following:   Height as of this encounter: 5' 2 (1.575 m).   Weight as of this encounter: 70.3 kg. Advance diet Up with therapy  DVT Prophylaxis - Aspirin  Weight bearing as tolerated. Begin therapy.  Plan is to go Home after hospital stay. Discharge tomorrow pending pain control and therapy progression.  Roxie Mess, PA-C Orthopedic Surgery [phone] 09/03/2024, 8:33 AM  "

## 2024-09-04 ENCOUNTER — Encounter (HOSPITAL_COMMUNITY): Payer: Self-pay | Admitting: Orthopedic Surgery

## 2024-09-04 LAB — CBC
HCT: 39.7 % (ref 36.0–46.0)
Hemoglobin: 13.2 g/dL (ref 12.0–15.0)
MCH: 32.6 pg (ref 26.0–34.0)
MCHC: 33.2 g/dL (ref 30.0–36.0)
MCV: 98 fL (ref 80.0–100.0)
Platelets: 139 10*3/uL — ABNORMAL LOW (ref 150–400)
RBC: 4.05 MIL/uL (ref 3.87–5.11)
RDW: 13 % (ref 11.5–15.5)
WBC: 15 10*3/uL — ABNORMAL HIGH (ref 4.0–10.5)
nRBC: 0 % (ref 0.0–0.2)

## 2024-09-04 MED ORDER — ONDANSETRON HCL 4 MG PO TABS: 4.0000 mg | ORAL_TABLET | Freq: Four times a day (QID) | ORAL | 0 refills | Status: AC | PRN

## 2024-09-04 MED ORDER — METHOCARBAMOL 500 MG PO TABS: 500.0000 mg | ORAL_TABLET | Freq: Four times a day (QID) | ORAL | 0 refills | Status: AC | PRN

## 2024-09-04 MED ORDER — ASPIRIN 81 MG PO CHEW: 81.0000 mg | CHEWABLE_TABLET | Freq: Two times a day (BID) | ORAL | 0 refills | Status: AC

## 2024-09-04 MED ORDER — TRAMADOL HCL 50 MG PO TABS: 50.0000 mg | ORAL_TABLET | Freq: Four times a day (QID) | ORAL | 0 refills | Status: AC | PRN

## 2024-09-04 MED ORDER — HYDROMORPHONE HCL 2 MG PO TABS: 2.0000 mg | ORAL_TABLET | ORAL | 0 refills | Status: AC | PRN

## 2024-09-04 NOTE — Progress Notes (Signed)
" ° °  Subjective: 2 Days Post-Op Procedures (LRB): ARTHROPLASTY, HIP, TOTAL, ANTERIOR APPROACH (Right) Patient reports pain as mild.   Patient seen in rounds by Dr. Melodi. Patient is well, and has had no acute complaints or problems. Denies chest pain or SOB. No issues overnight. Foley catheter removed this AM. We will continue therapy today, ambulated 15' yesterday.   Objective: Vital signs in last 24 hours: Temp:  [97.3 F (36.3 C)-98.6 F (37 C)] 97.3 F (36.3 C) (01/28 0500) Pulse Rate:  [75-93] 80 (01/28 0500) Resp:  [16-19] 17 (01/28 0500) BP: (127-138)/(69-85) 133/82 (01/28 0500) SpO2:  [94 %-97 %] 94 % (01/28 0500)  Intake/Output from previous day:  Intake/Output Summary (Last 24 hours) at 09/04/2024 0749 Last data filed at 09/04/2024 0600 Gross per 24 hour  Intake 1477.44 ml  Output 700 ml  Net 777.44 ml     Intake/Output this shift: No intake/output data recorded.  Labs: Recent Labs    09/03/24 0512 09/04/24 0454  HGB 13.9 13.2   Recent Labs    09/03/24 0512 09/04/24 0454  WBC 16.6* 15.0*  RBC 4.28 4.05  HCT 41.5 39.7  PLT 144* 139*   Recent Labs    09/03/24 0512  NA 136  K 4.7  CL 104  CO2 22  BUN 17  CREATININE 0.89  GLUCOSE 141*  CALCIUM  8.8*   No results for input(s): LABPT, INR in the last 72 hours.  Exam: General - Patient is Alert and Oriented Extremity - Neurologically intact Neurovascular intact Sensation intact distally Dorsiflexion/Plantar flexion intact Dressing - dressing C/D/I Motor Function - intact, moving foot and toes well on exam.   Past Medical History:  Diagnosis Date   Anxiety    Arthritis    knees,    Cancer (HCC)    skin cancer on nose   Chronic kidney disease    Ejection fraction    Normal, echo, June, 2013   GERD (gastroesophageal reflux disease)    Headache(784.0)    hx of migraines    History of hiatal hernia    Hypertension    Irritable bowel syndrome (IBS)    PONV (postoperative nausea and  vomiting)    Sinus tachycardia    Persistent sinus tachycardia, June, 2013 ( prior monitor in 2007 had shown no significant abnormalities.)    Assessment/Plan: 2 Days Post-Op Procedures (LRB): ARTHROPLASTY, HIP, TOTAL, ANTERIOR APPROACH (Right) Principal Problem:   OA (osteoarthritis) of hip Active Problems:   Osteoarthritis of right hip  Estimated body mass index is 28.35 kg/m as calculated from the following:   Height as of this encounter: 5' 2 (1.575 m).   Weight as of this encounter: 70.3 kg. Advance diet Up with therapy D/C IV fluids  DVT Prophylaxis - Aspirin  Weight bearing as tolerated. Continue therapy.  Plan is to go Home after hospital stay. Plan for discharge with HEP later today if progresses with therapy and meeting goals. Follow-up in the office in 2 weeks.  The PDMP database was reviewed today prior to any opioid medications being prescribed to this patient.  Waddell Sor, PA-C Orthopedic Surgery [phone] 09/04/2024, 7:49 AM  "

## 2024-09-04 NOTE — Progress Notes (Signed)
 Reviewed written d/c instructions w pt and her daughter, all questions answered and they both verbalized understanding. D/C via w/c w all belongings in stable condition.

## 2024-09-04 NOTE — Progress Notes (Signed)
 Physical Therapy Treatment Patient Details Name: Kathleen Velazquez MRN: [id number] DOB: 05/11/1956 Today's Date: 09/04/2024   History of Present Illness Patient is 69 y.o. female s/p Rt THA on 09/02/24 with PMH significant for HTN, GERD, OA, anxiety, Lt TKA in 2013, R TKA 2021.    PT Comments  POD # 2 am session PT - Cognition Comments: AxO x 3 pleasant lady who has had prior Total Joint Replacements.  Dr Ludie did my knee.  Pt plans to D/C back home with HEP. Pt was OOB in recliner.  General Gait Details: tolerated an increased distance with Min VC's on safety with turns and Educated Dr Hiram likes his Pt to amb every hour for approx 10 mion.  Pt did recall that was mentioned.  Pain control improved with meds and anxiety better controloled with conversation/distraction/positive reinforcement. NO stairs to enter home. Then returned to room to perform some TE's following HEP handout.  Instructed on proper tech, freq as well as use of ICE.   Addressed all mobility questions, discussed appropriate activity, educated on use of ICE.  Pt ready for D/C to home.    If plan is discharge home, recommend the following: A little help with walking and/or transfers;A little help with bathing/dressing/bathroom;Assistance with cooking/housework;Assist for transportation   Can travel by private vehicle        Equipment Recommendations  None recommended by PT    Recommendations for Other Services       Precautions / Restrictions Precautions Precautions: None Restrictions Weight Bearing Restrictions Per Provider Order: No RLE Weight Bearing Per Provider Order: Weight bearing as tolerated     Mobility  Bed Mobility               General bed mobility comments: OOB in recliner    Transfers Overall transfer level: Needs assistance Equipment used: Rolling walker (2 wheels) Transfers: Sit to/from Stand Sit to Stand: Supervision, Contact guard assist           General transfer comment:  min VC's on proper hand placement and safety with turns.    Ambulation/Gait Ambulation/Gait assistance: Supervision, Contact guard assist Gait Distance (Feet): 45 Feet Assistive device: Rolling walker (2 wheels) Gait Pattern/deviations: Antalgic, Decreased weight shift to right, Decreased step length - right, Decreased stance time - right, Step-to pattern Gait velocity: decreased     General Gait Details: tolerated an increased distance with Min VC's on safety with turns and Educated Dr Hiram likes his Pt to amb every hour for approx 10 mion.  Pt did recall that was mentioned.  Pain control improved with meds and anxiety better controloled with conversation/distraction/positive reinforcement.   Stairs             Wheelchair Mobility     Tilt Bed    Modified Rankin (Stroke Patients Only)       Balance                                            Communication    Cognition Arousal: Alert     PT - Cognitive impairments: No apparent impairments                       PT - Cognition Comments: AxO x 3 pleasant lady who has had prior Total Joint Replacements.  Dr Ludie did my knee.  Pt plans to  D/C back home with HEP. Following commands: Intact      Cueing    Exercises  Total Hip Replacement TE's following HEP Handout 10 reps ankle pumps 05 reps knee presses 05 reps heel slides 05 reps SAQ's 05 reps ABD 05 reps LAQ's 03 reps all standing TE's Instructed how to use a belt loop to assist  Followed by ICE     General Comments        Pertinent Vitals/Pain Pain Assessment Pain Assessment: 0-10 Pain Score: 8  Pain Location: RLE Pain Descriptors / Indicators: Discomfort, Grimacing, Operative site guarding, Sharp Pain Intervention(s): Monitored during session, Premedicated before session, Repositioned, Ice applied    Home Living                          Prior Function            PT Goals (current goals can now be  found in the care plan section) Progress towards PT goals: Progressing toward goals    Frequency    7X/week      PT Plan      Co-evaluation              AM-PAC PT 6 Clicks Mobility   Outcome Measure  Help needed turning from your back to your side while in a flat bed without using bedrails?: A Little Help needed moving from lying on your back to sitting on the side of a flat bed without using bedrails?: A Little Help needed moving to and from a bed to a chair (including a wheelchair)?: A Little Help needed standing up from a chair using your arms (e.g., wheelchair or bedside chair)?: A Little Help needed to walk in hospital room?: A Little Help needed climbing 3-5 steps with a railing? : A Lot 6 Click Score: 17    End of Session Equipment Utilized During Treatment: Gait belt Activity Tolerance: Patient tolerated treatment well Patient left: in chair;with call bell/phone within reach Nurse Communication: Mobility status PT Visit Diagnosis: Muscle weakness (generalized) (M62.81);Difficulty in walking, not elsewhere classified (R26.2);Unsteadiness on feet (R26.81);Pain Pain - Right/Left: Right Pain - part of body: Hip     Time: 9059-8987 PT Time Calculation (min) (ACUTE ONLY): 32 min  Charges:    $Gait Training: 8-22 mins $Therapeutic Exercise: 8-22 mins PT General Charges $$ ACUTE PT VISIT: 1 Visit                    Katheryn Leap  PTA Acute  Rehabilitation Services Office M-F          [phone]

## 2024-09-04 NOTE — Discharge Summary (Signed)
 Patient ID: Kathleen Velazquez MRN: [id number] DOB/AGE: [birth year] 71 y.o.  Admit date: 09/02/2024 Discharge date: 09/04/2024  Admission Diagnoses:  Principal Problem:   OA (osteoarthritis) of hip Active Problems:   Osteoarthritis of right hip   Discharge Diagnoses:  Same  Past Medical History:  Diagnosis Date   Anxiety    Arthritis    knees,    Cancer (HCC)    skin cancer on nose   Chronic kidney disease    Ejection fraction    Normal, echo, June, 2013   GERD (gastroesophageal reflux disease)    Headache(784.0)    hx of migraines    History of hiatal hernia    Hypertension    Irritable bowel syndrome (IBS)    PONV (postoperative nausea and vomiting)    Sinus tachycardia    Persistent sinus tachycardia, June, 2013 ( prior monitor in 2007 had shown no significant abnormalities.)    Surgeries: Procedures: ARTHROPLASTY, HIP, TOTAL, ANTERIOR APPROACH on 09/02/2024   Consultants:   Discharged Condition: Improved  Hospital Course: Kathleen Velazquez is an 69 y.o. female who was admitted 09/02/2024 for operative treatment ofOA (osteoarthritis) of hip. Patient has severe unremitting pain that affects sleep, daily activities, and work/hobbies. After pre-op clearance the patient was taken to the operating room on 09/02/2024 and underwent  Procedures: ARTHROPLASTY, HIP, TOTAL, ANTERIOR APPROACH.    Patient was given perioperative antibiotics:  Anti-infectives (From admission, onward)    Start     Dose/Rate Route Frequency Ordered Stop   09/02/24 2000  ceFAZolin  (ANCEF ) IVPB 2g/100 mL premix        2 g 200 mL/hr over 30 Minutes Intravenous Every 6 hours 09/02/24 1713 09/03/24 0321   09/02/24 1115  ceFAZolin  (ANCEF ) IVPB 2g/100 mL premix        2 g 200 mL/hr over 30 Minutes Intravenous On call to O.R. 09/02/24 1111 09/02/24 1339        Patient was given sequential compression devices, early ambulation, and chemoprophylaxis to prevent DVT.  Patient benefited maximally from  hospital stay and there were no complications.    Recent vital signs: Patient Vitals for the past 24 hrs:  BP Temp Temp src Pulse Resp SpO2  09/04/24 0500 133/82 (!) 97.3 F (36.3 C) Oral 80 17 94 %  09/03/24 2114 134/85 (!) 97.3 F (36.3 C) Oral 93 17 96 %     Recent laboratory studies:  Recent Labs    09/03/24 0512 09/04/24 0454  WBC 16.6* 15.0*  HGB 13.9 13.2  HCT 41.5 39.7  PLT 144* 139*  NA 136  --   K 4.7  --   CL 104  --   CO2 22  --   BUN 17  --   CREATININE 0.89  --   GLUCOSE 141*  --   CALCIUM  8.8*  --      Discharge Medications:   Allergies as of 09/04/2024       Reactions   Codeine Nausea And Vomiting        Medication List     TAKE these medications    alendronate 70 MG tablet Commonly known as: FOSAMAX Take 70 mg by mouth once a week. Take with a full glass of water on an empty stomach.   aspirin  81 MG chewable tablet Chew 1 tablet (81 mg total) by mouth 2 (two) times daily for 21 days. Then chew 1 tablet by mouth once daily for 3 weeks.   chlorhexidine  4 % external liquid Commonly  known as: HIBICLENS  Apply 15 mLs (1 Application total) topically as directed for 30 doses. Use as directed daily for 5 days every other week for 6 weeks.   empagliflozin  10 MG Tabs tablet Commonly known as: JARDIANCE  Take 1 tablet (10 mg total) by mouth daily.   furosemide  20 MG tablet Commonly known as: LASIX  Take 1 tablet (20 mg total) by mouth 2 (two) times a week.   HYDROmorphone  2 MG tablet Commonly known as: DILAUDID  Take 1 tablet (2 mg total) by mouth every 4 (four) hours as needed for up to 5 days (For severe breakthrough pain not controlled by chronic pain medication).   methocarbamol  500 MG tablet Commonly known as: ROBAXIN  Take 1 tablet (500 mg total) by mouth every 6 (six) hours as needed for muscle spasms.   metoprolol  succinate 50 MG 24 hr tablet Commonly known as: TOPROL -XL Take 1 tablet (50 mg total) by mouth daily. Take with or  immediately following a meal.   mupirocin  ointment 2 % Commonly known as: BACTROBAN  Place 1 Application into the nose 2 (two) times daily for 60 doses. Use as directed 2 times daily for 5 days every other week for 6 weeks.   ondansetron  4 MG tablet Commonly known as: ZOFRAN  Take 1 tablet (4 mg total) by mouth every 6 (six) hours as needed for nausea.   Oxycodone  HCl 10 MG Tabs Take 1 tablet (10 mg total) by mouth every 6 (six) hours as needed for moderate pain (pain score 4-6) or severe pain (pain score 7-10). What changed: when to take this   potassium chloride  10 MEQ tablet Commonly known as: KLOR-CON  Take 1 tablet (10 mEq total) by mouth daily.   predniSONE  5 MG tablet Commonly known as: DELTASONE  Take 5 mg by mouth daily with breakfast.   rosuvastatin  10 MG tablet Commonly known as: CRESTOR  Take 1 tablet (10 mg total) by mouth daily.   spironolactone  25 MG tablet Commonly known as: ALDACTONE  Take 1 tablet (25 mg total) by mouth daily.   traMADol  50 MG tablet Commonly known as: ULTRAM  Take 1 tablet (50 mg total) by mouth every 6 (six) hours as needed for moderate pain (pain score 4-6).               Discharge Care Instructions  (From admission, onward)           Start     Ordered   09/04/24 0000  Weight bearing as tolerated        09/04/24 0803   09/04/24 0000  Change dressing       Comments: You have an adhesive waterproof bandage over the incision. Leave this in place until your first follow-up appointment. Once you remove this you will not need to place another bandage.   09/04/24 0803            Diagnostic Studies: DG Pelvis Portable Result Date: 09/02/2024 EXAM: 1 or 2 VIEW(S) XRAY OF THE PELVIS 09/02/2024 03:43:00 PM COMPARISON: None available. CLINICAL HISTORY: Postoperative state. FINDINGS: BONES AND JOINTS: Right total hip arthroplasty in place. At least moderate left hip degenerative changes. No acute fracture. No dislocation. No  malalignment. SOFT TISSUES: Expected overlying soft tissue edema and emphysema. IMPRESSION: 1. Right total hip arthroplasty in place with expected postoperative soft tissue edema and emphysema. 2. No acute fracture or dislocation. Electronically signed by: Morgane Naveau MD 09/02/2024 05:47 PM EST RP Workstation: [id number]   DG HIP UNILAT WITH PELVIS 1V RIGHT Result Date: 09/02/2024 CLINICAL DATA:  Right hip arthroplasty EXAM: DG HIP (WITH OR WITHOUT PELVIS) 1V RIGHT COMPARISON:  Right hip radiograph dated 08/11/2023 FINDINGS: Five fluoroscopic images obtained during right hip arthroplasty. 6 seconds fluoro time utilized. Radiation dose 0.7554 mGy Kerma. Please see performing physicians operative report for full details. IMPRESSION: Fluoroscopic images were obtained for intraoperative guidance of right hip arthroplasty. Electronically Signed   By: Limin  Xu M.D.   On: 09/02/2024 15:35   DG C-Arm 1-60 Min-No Report Result Date: 09/02/2024 Fluoroscopy was utilized by the requesting physician.  No radiographic interpretation.    Disposition: Discharge disposition: 01-Home or Self Care       Discharge Instructions     Call MD / Call 911   Complete by: As directed    If you experience chest pain or shortness of breath, CALL 911 and be transported to the hospital emergency room.  If you develope a fever above 101 F, pus (white drainage) or increased drainage or redness at the wound, or calf pain, call your surgeon's office.   Change dressing   Complete by: As directed    You have an adhesive waterproof bandage over the incision. Leave this in place until your first follow-up appointment. Once you remove this you will not need to place another bandage.   Constipation Prevention   Complete by: As directed    Drink plenty of fluids.  Prune juice may be helpful.  You may use a stool softener, such as Colace (over the counter) 100 mg twice a day.  Use MiraLax  (over the counter) for constipation as  needed.   Diet - low sodium heart healthy   Complete by: As directed    Do not sit on low chairs, stoools or toilet seats, as it may be difficult to get up from low surfaces   Complete by: As directed    Driving restrictions   Complete by: As directed    No driving for two weeks   Post-operative opioid taper instructions:   Complete by: As directed    POST-OPERATIVE OPIOID TAPER INSTRUCTIONS: It is important to wean off of your opioid medication as soon as possible. If you do not need pain medication after your surgery it is ok to stop day one. Opioids include: Codeine, Hydrocodone(Norco, Vicodin), Oxycodone (Percocet, oxycontin ) and hydromorphone  amongst others.  Long term and even short term use of opiods can cause: Increased pain response Dependence Constipation Depression Respiratory depression And more.  Withdrawal symptoms can include Flu like symptoms Nausea, vomiting And more Techniques to manage these symptoms Hydrate well Eat regular healthy meals Stay active Use relaxation techniques(deep breathing, meditating, yoga) Do Not substitute Alcohol to help with tapering If you have been on opioids for less than two weeks and do not have pain than it is ok to stop all together.  Plan to wean off of opioids This plan should start within one week post op of your joint replacement. Maintain the same interval or time between taking each dose and first decrease the dose.  Cut the total daily intake of opioids by one tablet each day Next start to increase the time between doses. The last dose that should be eliminated is the evening dose.      TED hose   Complete by: As directed    Use stockings (TED hose) for three weeks on both leg(s).  You may remove them at night for sleeping.   Weight bearing as tolerated   Complete by: As directed  Follow-up Information     Melodi Lerner, MD. Schedule an appointment as soon as possible for a visit in 2 week(s).    Specialty: Orthopedic Surgery Contact information: [address] [place] [address] [place] [address] [phone]                  Signed: Waddell DELENA Sor 09/04/2024, 2:07 PM

## 2024-09-04 NOTE — Plan of Care (Signed)
 " Problem: Education: Goal: Knowledge of General Education information will improve Description: Including pain rating scale, medication(s)/side effects and non-pharmacologic comfort measures 09/04/2024 1011 by Sebastian Boyer, RN Outcome: Adequate for Discharge 09/04/2024 1011 by Sebastian Boyer, RN Outcome: Adequate for Discharge   Problem: Health Behavior/Discharge Planning: Goal: Ability to manage health-related needs will improve 09/04/2024 1011 by Sebastian Boyer, RN Outcome: Adequate for Discharge 09/04/2024 1011 by Sebastian Boyer, RN Outcome: Adequate for Discharge   Problem: Clinical Measurements: Goal: Ability to maintain clinical measurements within normal limits will improve 09/04/2024 1011 by Sebastian Boyer, RN Outcome: Adequate for Discharge 09/04/2024 1011 by Sebastian Boyer, RN Outcome: Adequate for Discharge Goal: Will remain free from infection 09/04/2024 1011 by Sebastian Boyer, RN Outcome: Adequate for Discharge 09/04/2024 1011 by Sebastian Boyer, RN Outcome: Adequate for Discharge Goal: Diagnostic test results will improve 09/04/2024 1011 by Sebastian Boyer, RN Outcome: Adequate for Discharge 09/04/2024 1011 by Sebastian Boyer, RN Outcome: Adequate for Discharge Goal: Respiratory complications will improve 09/04/2024 1011 by Sebastian Boyer, RN Outcome: Adequate for Discharge 09/04/2024 1011 by Sebastian Boyer, RN Outcome: Adequate for Discharge Goal: Cardiovascular complication will be avoided 09/04/2024 1011 by Sebastian Boyer, RN Outcome: Adequate for Discharge 09/04/2024 1011 by Sebastian Boyer, RN Outcome: Adequate for Discharge   Problem: Activity: Goal: Risk for activity intolerance will decrease 09/04/2024 1011 by Sebastian Boyer, RN Outcome: Adequate for Discharge 09/04/2024 1011 by Sebastian Boyer, RN Outcome: Adequate for Discharge   Problem: Nutrition: Goal: Adequate nutrition will be maintained 09/04/2024 1011 by Sebastian Boyer,  RN Outcome: Adequate for Discharge 09/04/2024 1011 by Sebastian Boyer, RN Outcome: Adequate for Discharge   Problem: Coping: Goal: Level of anxiety will decrease 09/04/2024 1011 by Sebastian Boyer, RN Outcome: Adequate for Discharge 09/04/2024 1011 by Sebastian Boyer, RN Outcome: Adequate for Discharge   Problem: Elimination: Goal: Will not experience complications related to bowel motility 09/04/2024 1011 by Sebastian Boyer, RN Outcome: Adequate for Discharge 09/04/2024 1011 by Sebastian Boyer, RN Outcome: Adequate for Discharge Goal: Will not experience complications related to urinary retention 09/04/2024 1011 by Sebastian Boyer, RN Outcome: Adequate for Discharge 09/04/2024 1011 by Sebastian Boyer, RN Outcome: Adequate for Discharge   Problem: Pain Managment: Goal: General experience of comfort will improve and/or be controlled 09/04/2024 1011 by Sebastian Boyer, RN Outcome: Adequate for Discharge 09/04/2024 1011 by Sebastian Boyer, RN Outcome: Adequate for Discharge   Problem: Safety: Goal: Ability to remain free from injury will improve 09/04/2024 1011 by Sebastian Boyer, RN Outcome: Adequate for Discharge 09/04/2024 1011 by Sebastian Boyer, RN Outcome: Adequate for Discharge   Problem: Skin Integrity: Goal: Risk for impaired skin integrity will decrease 09/04/2024 1011 by Sebastian Boyer, RN Outcome: Adequate for Discharge 09/04/2024 1011 by Sebastian Boyer, RN Outcome: Adequate for Discharge   Problem: Education: Goal: Knowledge of the prescribed therapeutic regimen will improve Outcome: Adequate for Discharge   Problem: Bowel/Gastric: Goal: Gastrointestinal status for postoperative course will improve Outcome: Adequate for Discharge   Problem: Cardiac: Goal: Ability to maintain an adequate cardiac output Outcome: Adequate for Discharge Goal: Will show no evidence of cardiac arrhythmias Outcome: Adequate for Discharge   Problem: Nutritional: Goal:  Will attain and maintain optimal nutritional status Outcome: Adequate for Discharge   Problem: Neurological: Goal: Will regain or maintain usual level of consciousness Outcome: Adequate for Discharge   Problem: Clinical Measurements: Goal: Ability to maintain clinical measurements within normal limits Outcome: Adequate for Discharge Goal: Postoperative complications will be avoided or minimized Outcome: Adequate for Discharge   Problem: Respiratory: Goal:  Will regain and/or maintain adequate ventilation Outcome: Adequate for Discharge Goal: Respiratory status will improve Outcome: Adequate for Discharge   Problem: Skin Integrity: Goal: Demonstrates signs of wound healing without infection Outcome: Adequate for Discharge   Problem: Urinary Elimination: Goal: Will remain free from infection Outcome: Adequate for Discharge Goal: Ability to achieve and maintain adequate urine output Outcome: Adequate for Discharge   Problem: Education: Goal: Knowledge of the prescribed therapeutic regimen will improve Outcome: Adequate for Discharge Goal: Understanding of discharge needs will improve Outcome: Adequate for Discharge Goal: Individualized Educational Video(s) Outcome: Adequate for Discharge   Problem: Activity: Goal: Ability to avoid complications of mobility impairment will improve Outcome: Adequate for Discharge Goal: Ability to tolerate increased activity will improve Outcome: Adequate for Discharge   Problem: Clinical Measurements: Goal: Postoperative complications will be avoided or minimized Outcome: Adequate for Discharge   Problem: Pain Management: Goal: Pain level will decrease with appropriate interventions Outcome: Adequate for Discharge   Problem: Skin Integrity: Goal: Will show signs of wound healing Outcome: Adequate for Discharge   "
